# Patient Record
Sex: Male | Born: 1940 | Race: Black or African American | Hispanic: No | Marital: Married | Smoking: Former smoker
Health system: Southern US, Community
[De-identification: ages and names within clinical notes are randomized; demographics above are authoritative.]

## PROBLEM LIST (undated history)

## (undated) DIAGNOSIS — G4733 Obstructive sleep apnea (adult) (pediatric): Secondary | ICD-10-CM

## (undated) DIAGNOSIS — H9190 Unspecified hearing loss, unspecified ear: Secondary | ICD-10-CM

## (undated) DIAGNOSIS — R0600 Dyspnea, unspecified: Secondary | ICD-10-CM

## (undated) DIAGNOSIS — Z794 Long term (current) use of insulin: Secondary | ICD-10-CM

## (undated) DIAGNOSIS — N433 Hydrocele, unspecified: Secondary | ICD-10-CM

## (undated) DIAGNOSIS — Z972 Presence of dental prosthetic device (complete) (partial): Secondary | ICD-10-CM

## (undated) DIAGNOSIS — Z9989 Dependence on other enabling machines and devices: Secondary | ICD-10-CM

## (undated) DIAGNOSIS — N183 Chronic kidney disease, stage 3 unspecified: Secondary | ICD-10-CM

## (undated) DIAGNOSIS — E782 Mixed hyperlipidemia: Secondary | ICD-10-CM

## (undated) DIAGNOSIS — E119 Type 2 diabetes mellitus without complications: Secondary | ICD-10-CM

## (undated) DIAGNOSIS — R0609 Other forms of dyspnea: Secondary | ICD-10-CM

## (undated) DIAGNOSIS — I10 Essential (primary) hypertension: Secondary | ICD-10-CM

## (undated) DIAGNOSIS — K08109 Complete loss of teeth, unspecified cause, unspecified class: Secondary | ICD-10-CM

## (undated) DIAGNOSIS — R600 Localized edema: Secondary | ICD-10-CM

## (undated) DIAGNOSIS — R351 Nocturia: Secondary | ICD-10-CM

## (undated) DIAGNOSIS — I714 Abdominal aortic aneurysm, without rupture, unspecified: Secondary | ICD-10-CM

## (undated) DIAGNOSIS — M1990 Unspecified osteoarthritis, unspecified site: Secondary | ICD-10-CM

## (undated) DIAGNOSIS — I5042 Chronic combined systolic (congestive) and diastolic (congestive) heart failure: Secondary | ICD-10-CM

## (undated) DIAGNOSIS — Z6841 Body Mass Index (BMI) 40.0 and over, adult: Secondary | ICD-10-CM

## (undated) DIAGNOSIS — L439 Lichen planus, unspecified: Secondary | ICD-10-CM

## (undated) DIAGNOSIS — E1140 Type 2 diabetes mellitus with diabetic neuropathy, unspecified: Secondary | ICD-10-CM

## (undated) HISTORY — PX: CARDIOVASCULAR STRESS TEST: SHX262

## (undated) HISTORY — DX: Lichen planus, unspecified: L43.9

## (undated) HISTORY — PX: MULTIPLE TOOTH EXTRACTIONS: SHX2053

## (undated) HISTORY — PX: TOTAL KNEE ARTHROPLASTY: SHX125

## (undated) HISTORY — DX: Body Mass Index (BMI) 40.0 and over, adult: Z684

## (undated) HISTORY — DX: Morbid (severe) obesity due to excess calories: E66.01

## (undated) HISTORY — PX: PROSTATECTOMY: SHX69

## (undated) HISTORY — PX: COLONOSCOPY W/ BIOPSIES AND POLYPECTOMY: SHX1376

## (undated) HISTORY — PX: CARDIAC CATHETERIZATION: SHX172

## (undated) HISTORY — PX: TRANSTHORACIC ECHOCARDIOGRAM: SHX275

---

## 2000-01-20 ENCOUNTER — Encounter: Admission: RE | Admit: 2000-01-20 | Discharge: 2000-04-19 | Payer: Self-pay | Admitting: Internal Medicine

## 2001-07-24 ENCOUNTER — Emergency Department (HOSPITAL_COMMUNITY): Admission: EM | Admit: 2001-07-24 | Discharge: 2001-07-24 | Payer: Self-pay | Admitting: Emergency Medicine

## 2004-01-09 ENCOUNTER — Ambulatory Visit (HOSPITAL_BASED_OUTPATIENT_CLINIC_OR_DEPARTMENT_OTHER): Admission: RE | Admit: 2004-01-09 | Discharge: 2004-01-09 | Payer: Self-pay | Admitting: Internal Medicine

## 2007-12-02 HISTORY — PX: KNEE ARTHROSCOPY: SHX127

## 2008-10-14 DIAGNOSIS — Z9989 Dependence on other enabling machines and devices: Secondary | ICD-10-CM

## 2008-10-14 DIAGNOSIS — G4733 Obstructive sleep apnea (adult) (pediatric): Secondary | ICD-10-CM

## 2008-10-14 HISTORY — DX: Obstructive sleep apnea (adult) (pediatric): G47.33

## 2008-10-14 HISTORY — DX: Obstructive sleep apnea (adult) (pediatric): Z99.89

## 2009-04-27 ENCOUNTER — Encounter: Admission: RE | Admit: 2009-04-27 | Discharge: 2009-04-27 | Payer: Self-pay | Admitting: Orthopedic Surgery

## 2009-04-28 ENCOUNTER — Encounter: Admission: RE | Admit: 2009-04-28 | Discharge: 2009-04-28 | Payer: Self-pay | Admitting: Orthopedic Surgery

## 2009-08-27 ENCOUNTER — Inpatient Hospital Stay (HOSPITAL_COMMUNITY): Admission: RE | Admit: 2009-08-27 | Discharge: 2009-09-03 | Payer: Self-pay | Admitting: Orthopedic Surgery

## 2009-09-03 ENCOUNTER — Encounter (INDEPENDENT_AMBULATORY_CARE_PROVIDER_SITE_OTHER): Payer: Self-pay | Admitting: Orthopedic Surgery

## 2009-09-03 ENCOUNTER — Ambulatory Visit: Payer: Self-pay | Admitting: Vascular Surgery

## 2010-12-01 IMAGING — CT CT ABDOMEN W/O CM
3 of 4 series · 13 of 42 positions shown, 19 images · non-contrast
Comparison: None.

CT CHEST

CLINICAL DATA: Cough with abnormal chest x-ray.  Elevated right
hemidiaphragm.

CT CHEST AND ABDOMEN WITHOUT CONTRAST
TECHNIQUE: Multidetector CT imaging of the chest and abdomen was p
erformed following the standard
protocol without intravenous contrast.

[Series 2: chest/abd/pelvis · axial · 0.98mm/px · z∈[-430,-60]mm · 8 of 96 slices shown, 13 images]
[im 11/96  soft-tissue]
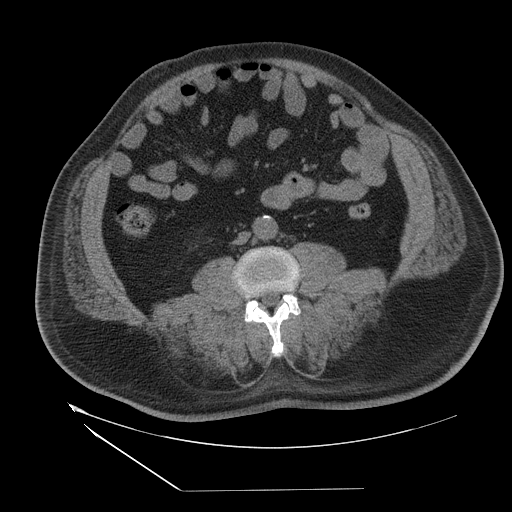
[im 11/96  bone]
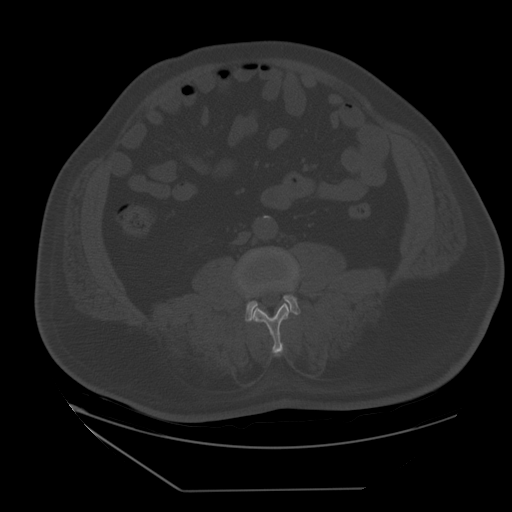
[im 22/96  soft-tissue]
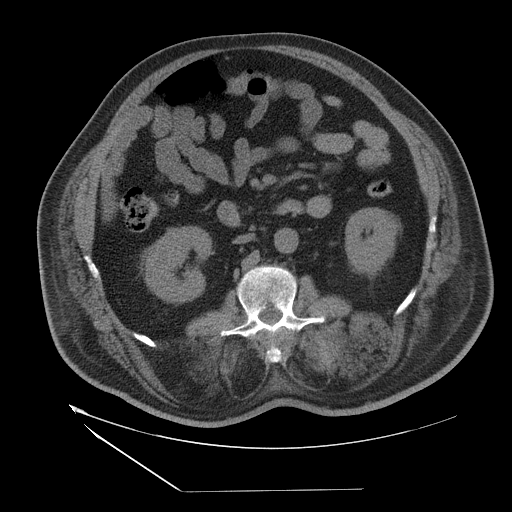
[im 32/96  soft-tissue]
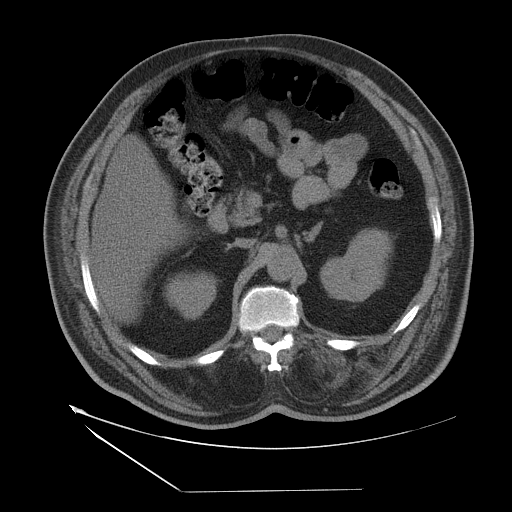
[im 43/96  soft-tissue]
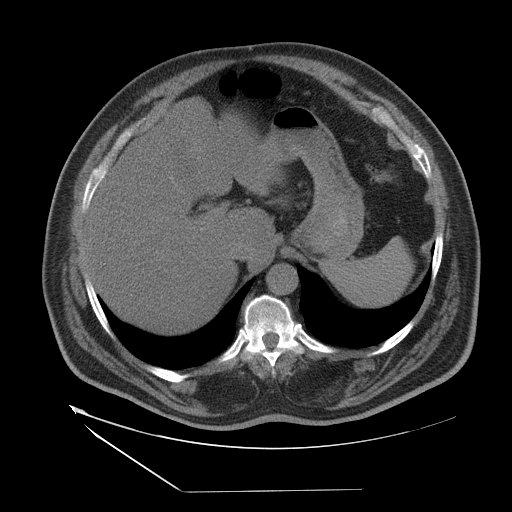
[im 53/96  soft-tissue]
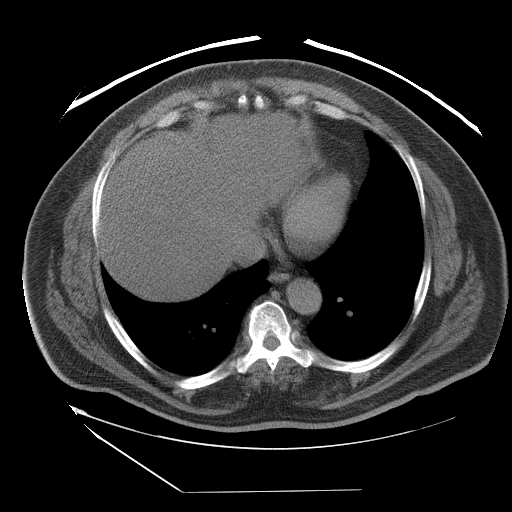
[im 53/96  lung]
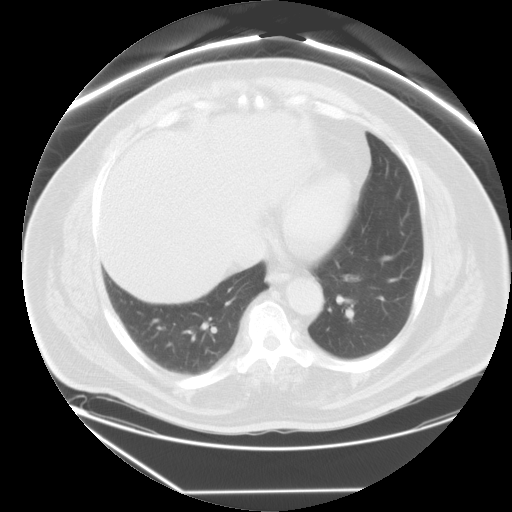
[im 64/96  soft-tissue]
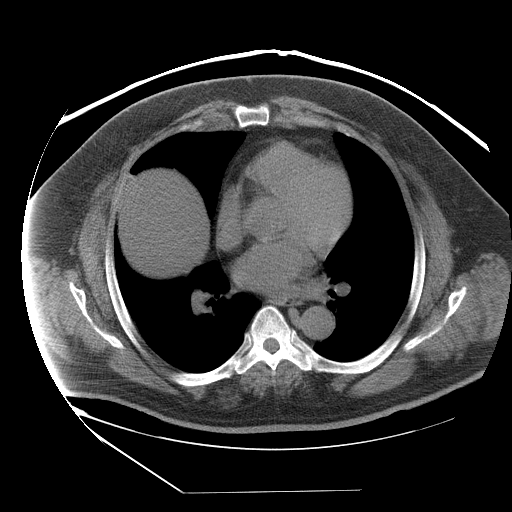
[im 64/96  lung]
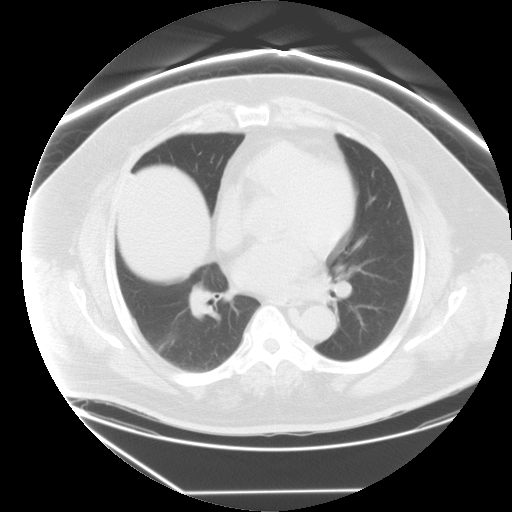
[im 74/96  soft-tissue]
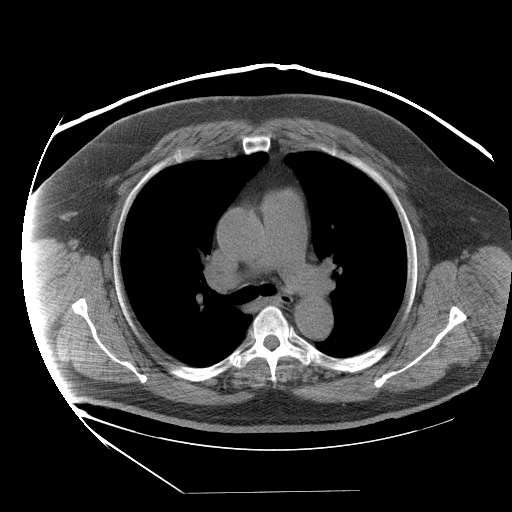
[im 74/96  lung]
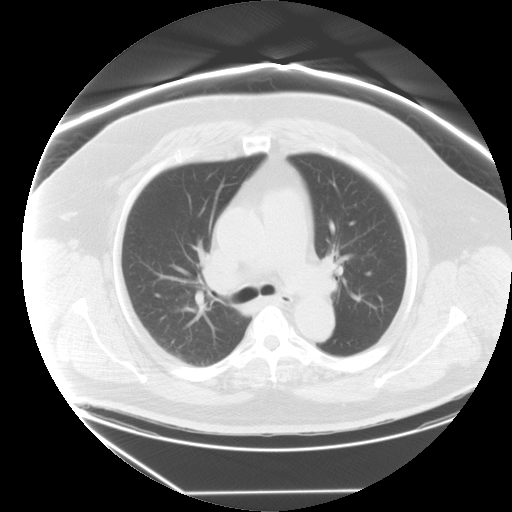
[im 85/96  soft-tissue]
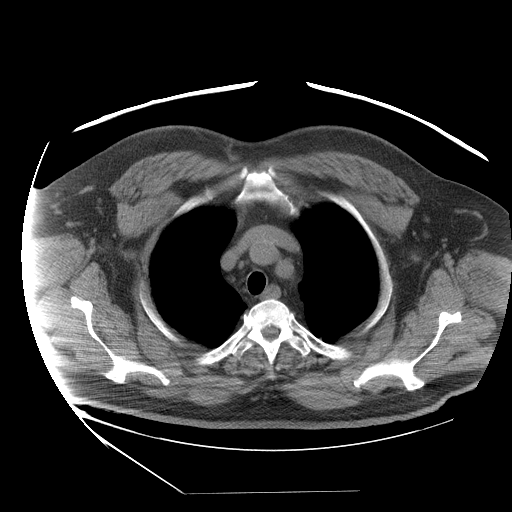
[im 85/96  lung]
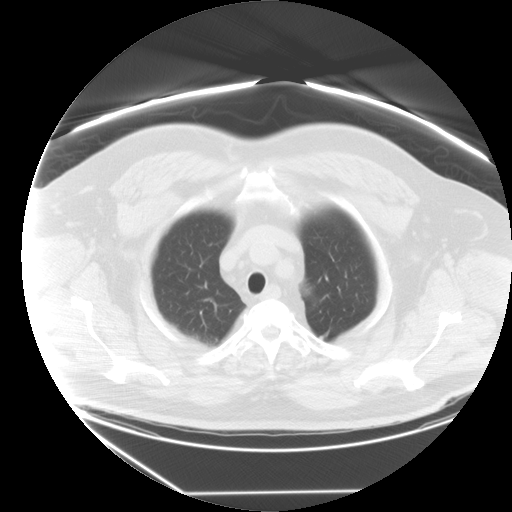

[Series 402: sag chest/abd · sagittal · 0.98mm/px · 2 of 146 slices shown]
[im 11/146  soft-tissue]
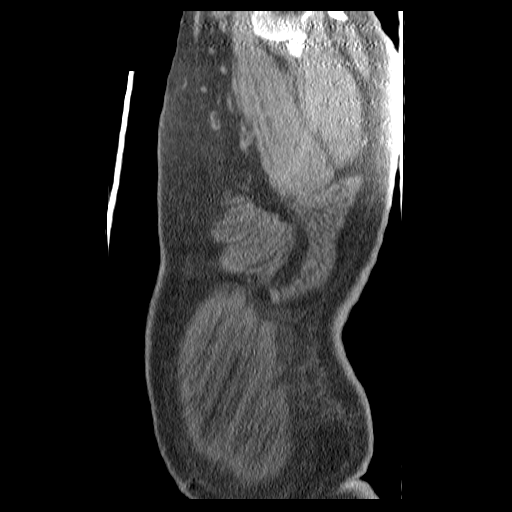
[im 32/146  soft-tissue]
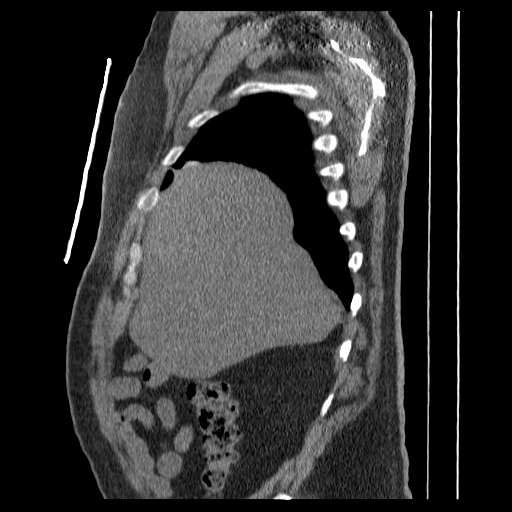

[Series 403: cor chest/abd · coronal · 0.98mm/px · 3 of 117 slices shown, 4 images]
[im 39/117  soft-tissue]
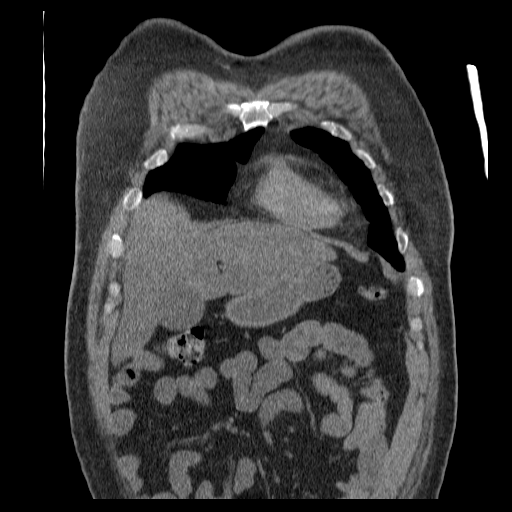
[im 52/117  soft-tissue]
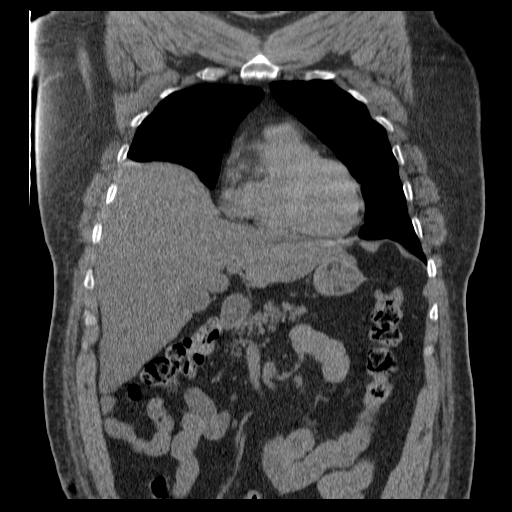
[im 52/117  bone]
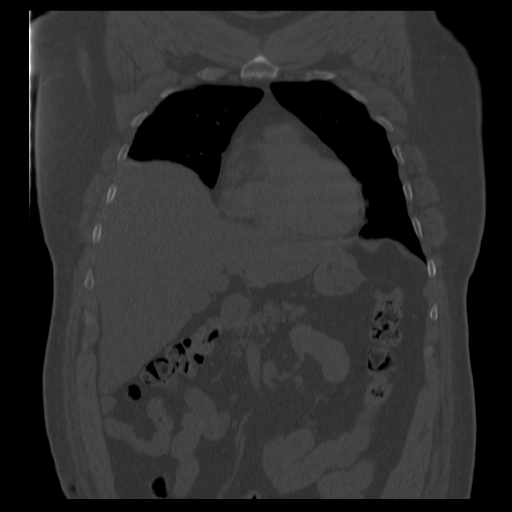
[im 65/117  soft-tissue]
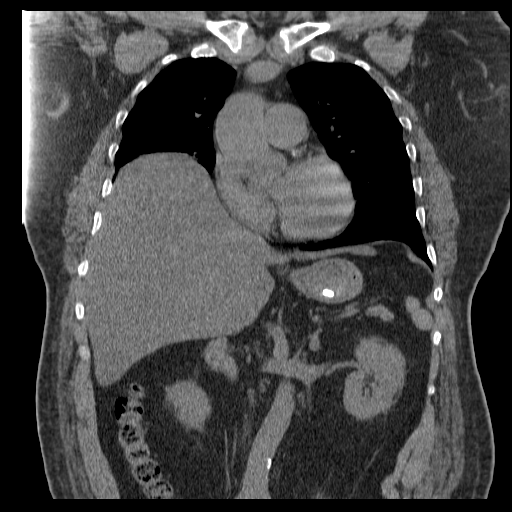

[13 of 42 positions shown; findings below may reference images not displayed]

FINDINGS: There is no axillary, mediastinal, or hilar
lymphadenopathy.  11 mm short-axis AP window lymph node is
borderline enlarged.  Heart size is normal.  There is no
pericardial or pleural effusion.

The elevation of the right hemidiaphragm is noted with some
minimal/trace atelectasis at the central right lung base.  Other
tiny focus of subsegmental atelectasis or linear scars seen
posterior to the proximal descending thoracic aorta.  There is no
focal airspace consolidation.  No parenchymal nodule or mass.
IMPRESSION: No acute findings in the chest.  Asymmetric elevation of the right
hemidiaphragm with tiny areas of subsegmental atelectasis
bilaterally.

CT ABDOMEN
FINDINGS: The liver shows diffuse fatty infiltration.  No focal
abnormality is evident within the liver or spleen on this study
without intravenous contrast material.  The stomach, duodenum,
pancreas, gallbladder, adrenal glands, and abdominal bowel loops
are normal.  Tiny cysts are seen in each kidney.

No abdominal aortic aneurysm.  No intraperitoneal free fluid.  No
abdominal lymphadenopathy.
IMPRESSION: Diffuse fatty infiltration of the liver.

## 2010-12-23 ENCOUNTER — Encounter: Payer: Self-pay | Admitting: Orthopedic Surgery

## 2011-03-06 LAB — GLUCOSE, CAPILLARY
Glucose-Capillary: 160 mg/dL — ABNORMAL HIGH (ref 70–99)
Glucose-Capillary: 166 mg/dL — ABNORMAL HIGH (ref 70–99)
Glucose-Capillary: 215 mg/dL — ABNORMAL HIGH (ref 70–99)
Glucose-Capillary: 220 mg/dL — ABNORMAL HIGH (ref 70–99)

## 2011-03-06 LAB — CROSSMATCH: Antibody Screen: NEGATIVE

## 2011-03-06 LAB — CBC
HCT: 25 % — ABNORMAL LOW (ref 39.0–52.0)
Hemoglobin: 8.4 g/dL — ABNORMAL LOW (ref 13.0–17.0)
Hemoglobin: 8.4 g/dL — ABNORMAL LOW (ref 13.0–17.0)
MCHC: 33.2 g/dL (ref 30.0–36.0)
MCHC: 33.5 g/dL (ref 30.0–36.0)
MCV: 81.6 fL (ref 78.0–100.0)
Platelets: 152 10*3/uL (ref 150–400)
RBC: 3 MIL/uL — ABNORMAL LOW (ref 4.22–5.81)
RDW: 15.1 % (ref 11.5–15.5)
WBC: 7.3 10*3/uL (ref 4.0–10.5)
WBC: 8.8 10*3/uL (ref 4.0–10.5)

## 2011-03-06 LAB — BASIC METABOLIC PANEL
BUN: 16 mg/dL (ref 6–23)
CO2: 27 mEq/L (ref 19–32)
CO2: 28 mEq/L (ref 19–32)
Calcium: 8.2 mg/dL — ABNORMAL LOW (ref 8.4–10.5)
Calcium: 8.5 mg/dL (ref 8.4–10.5)
Calcium: 8.7 mg/dL (ref 8.4–10.5)
Chloride: 101 mEq/L (ref 96–112)
Creatinine, Ser: 1.23 mg/dL (ref 0.4–1.5)
Creatinine, Ser: 1.79 mg/dL — ABNORMAL HIGH (ref 0.4–1.5)
GFR calc Af Amer: 46 mL/min — ABNORMAL LOW (ref >60)
GFR calc Af Amer: >60 mL/min (ref >60)
Glucose, Bld: 152 mg/dL — ABNORMAL HIGH (ref 70–99)
Sodium: 139 mEq/L (ref 135–145)

## 2011-03-06 LAB — PROTIME-INR
INR: 1.2 (ref 0.00–1.49)
INR: 1.2 (ref 0.00–1.49)
INR: 1.3 (ref 0.00–1.49)
INR: 1.4 (ref 0.00–1.49)
Prothrombin Time: 14.7 seconds (ref 11.6–15.2)
Prothrombin Time: 15.1 seconds (ref 11.6–15.2)
Prothrombin Time: 15.8 seconds — ABNORMAL HIGH (ref 11.6–15.2)

## 2011-03-06 LAB — COMPREHENSIVE METABOLIC PANEL
Alkaline Phosphatase: 33 U/L — ABNORMAL LOW (ref 39–117)
BUN: 11 mg/dL (ref 6–23)
Chloride: 99 mEq/L (ref 96–112)
GFR calc non Af Amer: 55 mL/min — ABNORMAL LOW (ref >60)
Glucose, Bld: 208 mg/dL — ABNORMAL HIGH (ref 70–99)
Potassium: 3.5 mEq/L (ref 3.5–5.1)
Total Bilirubin: 1 mg/dL (ref 0.3–1.2)

## 2011-03-07 LAB — URINE CULTURE: Culture: NO GROWTH

## 2011-03-07 LAB — CBC
HCT: 24.8 % — ABNORMAL LOW (ref 39.0–52.0)
HCT: 27.5 % — ABNORMAL LOW (ref 39.0–52.0)
HCT: 29 % — ABNORMAL LOW (ref 39.0–52.0)
HCT: 39.6 % (ref 39.0–52.0)
Hemoglobin: 13.2 g/dL (ref 13.0–17.0)
Hemoglobin: 8.4 g/dL — ABNORMAL LOW (ref 13.0–17.0)
Hemoglobin: 9.2 g/dL — ABNORMAL LOW (ref 13.0–17.0)
MCHC: 33.3 g/dL (ref 30.0–36.0)
MCV: 81.7 fL (ref 78.0–100.0)
MCV: 82 fL (ref 78.0–100.0)
MCV: 82 fL (ref 78.0–100.0)
Platelets: 118 10*3/uL — ABNORMAL LOW (ref 150–400)
Platelets: 133 10*3/uL — ABNORMAL LOW (ref 150–400)
RBC: 3.04 MIL/uL — ABNORMAL LOW (ref 4.22–5.81)
RBC: 3.36 MIL/uL — ABNORMAL LOW (ref 4.22–5.81)
RBC: 3.54 MIL/uL — ABNORMAL LOW (ref 4.22–5.81)
RBC: 4.09 MIL/uL — ABNORMAL LOW (ref 4.22–5.81)
RDW: 14.8 % (ref 11.5–15.5)
WBC: 10 10*3/uL (ref 4.0–10.5)
WBC: 10.2 10*3/uL (ref 4.0–10.5)
WBC: 10.9 10*3/uL — ABNORMAL HIGH (ref 4.0–10.5)
WBC: 12 10*3/uL — ABNORMAL HIGH (ref 4.0–10.5)
WBC: 7.7 10*3/uL (ref 4.0–10.5)

## 2011-03-07 LAB — BASIC METABOLIC PANEL
BUN: 10 mg/dL (ref 6–23)
CO2: 25 mEq/L (ref 19–32)
CO2: 27 mEq/L (ref 19–32)
Calcium: 8.9 mg/dL (ref 8.4–10.5)
Chloride: 101 mEq/L (ref 96–112)
Chloride: 101 mEq/L (ref 96–112)
Chloride: 103 mEq/L (ref 96–112)
Chloride: 99 mEq/L (ref 96–112)
Creatinine, Ser: 1.38 mg/dL (ref 0.4–1.5)
Creatinine, Ser: 1.46 mg/dL (ref 0.4–1.5)
Creatinine, Ser: 2.28 mg/dL — ABNORMAL HIGH (ref 0.4–1.5)
GFR calc Af Amer: 35 mL/min — ABNORMAL LOW (ref >60)
GFR calc Af Amer: 38 mL/min — ABNORMAL LOW (ref >60)
GFR calc Af Amer: 58 mL/min — ABNORMAL LOW (ref >60)
GFR calc Af Amer: >60 mL/min (ref >60)
GFR calc non Af Amer: 48 mL/min — ABNORMAL LOW (ref >60)
GFR calc non Af Amer: 51 mL/min — ABNORMAL LOW (ref >60)
Potassium: 3.6 mEq/L (ref 3.5–5.1)
Potassium: 3.6 mEq/L (ref 3.5–5.1)
Potassium: 3.8 mEq/L (ref 3.5–5.1)

## 2011-03-07 LAB — GLUCOSE, CAPILLARY
Glucose-Capillary: 154 mg/dL — ABNORMAL HIGH (ref 70–99)
Glucose-Capillary: 180 mg/dL — ABNORMAL HIGH (ref 70–99)
Glucose-Capillary: 183 mg/dL — ABNORMAL HIGH (ref 70–99)
Glucose-Capillary: 206 mg/dL — ABNORMAL HIGH (ref 70–99)
Glucose-Capillary: 228 mg/dL — ABNORMAL HIGH (ref 70–99)
Glucose-Capillary: 247 mg/dL — ABNORMAL HIGH (ref 70–99)
Glucose-Capillary: 293 mg/dL — ABNORMAL HIGH (ref 70–99)

## 2011-03-07 LAB — DIFFERENTIAL
Basophils Absolute: 0.1 10*3/uL (ref 0.0–0.1)
Basophils Relative: 1 % (ref 0–1)
Eosinophils Relative: 1 % (ref 0–5)
Lymphocytes Relative: 16 % (ref 12–46)
Lymphs Abs: 1.6 10*3/uL (ref 0.7–4.0)
Monocytes Absolute: 0.9 10*3/uL (ref 0.1–1.0)
Monocytes Absolute: 1.8 10*3/uL — ABNORMAL HIGH (ref 0.1–1.0)
Monocytes Relative: 18 % — ABNORMAL HIGH (ref 3–12)
Neutro Abs: 3.2 10*3/uL (ref 1.7–7.7)
Neutro Abs: 6.4 10*3/uL (ref 1.7–7.7)
Neutrophils Relative %: 42 % — ABNORMAL LOW (ref 43–77)

## 2011-03-07 LAB — COMPREHENSIVE METABOLIC PANEL
Alkaline Phosphatase: 50 U/L (ref 39–117)
BUN: 18 mg/dL (ref 6–23)
Chloride: 100 mEq/L (ref 96–112)
Glucose, Bld: 158 mg/dL — ABNORMAL HIGH (ref 70–99)
Potassium: 4.2 mEq/L (ref 3.5–5.1)
Total Bilirubin: 0.6 mg/dL (ref 0.3–1.2)

## 2011-03-07 LAB — URINALYSIS, ROUTINE W REFLEX MICROSCOPIC
Glucose, UA: NEGATIVE mg/dL
Ketones, ur: NEGATIVE mg/dL
pH: 5.5 (ref 5.0–8.0)

## 2011-03-07 LAB — HEMOGLOBIN A1C
Hgb A1c MFr Bld: 7.6 % — ABNORMAL HIGH (ref 4.6–6.1)
Mean Plasma Glucose: 171 mg/dL

## 2011-03-07 LAB — URINALYSIS, MICROSCOPIC ONLY
Bilirubin Urine: NEGATIVE
Glucose, UA: 500 mg/dL — AB
Glucose, UA: NEGATIVE mg/dL
Ketones, ur: NEGATIVE mg/dL
Protein, ur: 100 mg/dL — AB
Specific Gravity, Urine: 1.018 (ref 1.005–1.030)
Urobilinogen, UA: 0.2 mg/dL (ref 0.0–1.0)
pH: 5.5 (ref 5.0–8.0)
pH: 6 (ref 5.0–8.0)

## 2011-03-07 LAB — CULTURE, BLOOD (ROUTINE X 2)
Culture: NO GROWTH
Culture: NO GROWTH
Culture: NO GROWTH
Culture: NO GROWTH

## 2011-03-07 LAB — APTT: aPTT: 28 seconds (ref 24–37)

## 2011-03-07 LAB — TYPE AND SCREEN
ABO/RH(D): A POS
Antibody Screen: NEGATIVE

## 2011-03-07 LAB — PROTIME-INR
INR: 0.9 (ref 0.00–1.49)
INR: 1.1 (ref 0.00–1.49)
Prothrombin Time: 16.1 seconds — ABNORMAL HIGH (ref 11.6–15.2)

## 2011-05-29 ENCOUNTER — Encounter: Payer: Self-pay | Admitting: Podiatry

## 2011-05-29 DIAGNOSIS — E119 Type 2 diabetes mellitus without complications: Secondary | ICD-10-CM | POA: Insufficient documentation

## 2012-12-02 ENCOUNTER — Other Ambulatory Visit (HOSPITAL_COMMUNITY): Payer: Self-pay | Admitting: Cardiovascular Disease

## 2012-12-02 DIAGNOSIS — I119 Hypertensive heart disease without heart failure: Secondary | ICD-10-CM

## 2012-12-07 ENCOUNTER — Ambulatory Visit (HOSPITAL_COMMUNITY)
Admission: RE | Admit: 2012-12-07 | Discharge: 2012-12-07 | Disposition: A | Discharge status: Home / Self Care | Payer: 59 | Source: Ambulatory Visit | Attending: Cardiovascular Disease | Admitting: Cardiovascular Disease

## 2012-12-07 DIAGNOSIS — E119 Type 2 diabetes mellitus without complications: Secondary | ICD-10-CM | POA: Insufficient documentation

## 2012-12-07 DIAGNOSIS — I379 Nonrheumatic pulmonary valve disorder, unspecified: Secondary | ICD-10-CM | POA: Diagnosis not present

## 2012-12-07 DIAGNOSIS — I059 Rheumatic mitral valve disease, unspecified: Secondary | ICD-10-CM | POA: Diagnosis not present

## 2012-12-07 DIAGNOSIS — I119 Hypertensive heart disease without heart failure: Secondary | ICD-10-CM | POA: Diagnosis present

## 2012-12-07 NOTE — Progress Notes (Signed)
2D Echo Performed 12/07/2012    Rajesh Wyss, RCS  

## 2012-12-13 ENCOUNTER — Encounter: Payer: Self-pay | Admitting: Physician Assistant

## 2012-12-13 ENCOUNTER — Other Ambulatory Visit: Payer: Self-pay | Admitting: Physician Assistant

## 2012-12-13 DIAGNOSIS — E1165 Type 2 diabetes mellitus with hyperglycemia: Secondary | ICD-10-CM

## 2012-12-13 DIAGNOSIS — E7800 Pure hypercholesterolemia, unspecified: Secondary | ICD-10-CM | POA: Insufficient documentation

## 2012-12-13 DIAGNOSIS — E118 Type 2 diabetes mellitus with unspecified complications: Secondary | ICD-10-CM

## 2012-12-13 DIAGNOSIS — G4733 Obstructive sleep apnea (adult) (pediatric): Secondary | ICD-10-CM | POA: Insufficient documentation

## 2012-12-13 DIAGNOSIS — Z6841 Body Mass Index (BMI) 40.0 and over, adult: Secondary | ICD-10-CM

## 2012-12-13 DIAGNOSIS — N400 Enlarged prostate without lower urinary tract symptoms: Secondary | ICD-10-CM | POA: Insufficient documentation

## 2012-12-13 DIAGNOSIS — E119 Type 2 diabetes mellitus without complications: Secondary | ICD-10-CM | POA: Insufficient documentation

## 2012-12-13 DIAGNOSIS — L439 Lichen planus, unspecified: Secondary | ICD-10-CM

## 2012-12-13 DIAGNOSIS — M1712 Unilateral primary osteoarthritis, left knee: Secondary | ICD-10-CM | POA: Insufficient documentation

## 2012-12-13 DIAGNOSIS — I10 Essential (primary) hypertension: Secondary | ICD-10-CM | POA: Insufficient documentation

## 2012-12-13 NOTE — H&P (Signed)
TOTAL KNEE ADMISSION H&P  Patient is being admitted for left total knee arthroplasty.  Subjective:  Chief Complaint:left knee pain.  HPI: Bradley Alvarez, 72 y.o. male, has a history of pain and functional disability in the left knee due to arthritis and has failed non-surgical conservative treatments for greater than 12 weeks to includeNSAID's and/or analgesics, corticosteriod injections, viscosupplementation injections, flexibility and strengthening excercises, supervised PT with diminished ADL's post treatment, use of assistive devices, weight reduction as appropriate and activity modification.  Onset of symptoms was gradual, starting >10 years ago with gradually worsening course since that time. The patient noted no past surgery on the left knee(s).  Patient currently rates pain in the left knee(s) at 10 out of 10 with activity. Patient has night pain, worsening of pain with activity and weight bearing, pain that interferes with activities of daily living, crepitus and joint swelling.  Patient has evidence of subchondral sclerosis, periarticular osteophytes and joint space narrowing by imaging studies.  There is no active infection.  Patient Active Problem List   Diagnosis Date Noted  . Hypertension   . Diabetes mellitus type 2, uncontrolled, with complications   . Hypercholesteremia   . Lichen planus   . Left knee DJD   . Morbid obesity with BMI of 45.0-49.9, adult   . Sleep apnea, obstructive   . Benign prostate hyperplasia   . Diabetes mellitus 05/29/2011   Past Medical History  Diagnosis Date  . Hypertension   . Diabetes mellitus type 2, uncontrolled, with complications   . Hypercholesteremia   . Benign prostate hyperplasia   . Sleep apnea, obstructive   . Morbid obesity with BMI of 45.0-49.9, adult   . Lichen planus   . Left knee DJD     Past Surgical History  Procedure Date  . 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 Suprapubic prostatectomy      (Not in a  hospital admission) No Known Allergies   Current Outpatient Prescriptions on File Prior to Visit  Medication Sig Dispense Refill  . carvedilol (COREG) 25 MG tablet Take 25 mg by mouth 2 (two) times daily with a meal.      . exenatide (BYETTA) 10 MCG/0.04ML SOLN Inject 10 mcg into the skin 2 (two) times daily with a meal.      . felodipine (PLENDIL) 5 MG 24 hr tablet Take 10 mg by mouth daily.      . furosemide (LASIX) 40 MG tablet Take 40 mg by mouth daily.      Marland Kitchen glimepiride (AMARYL) 4 MG tablet Take 4 mg by mouth daily before breakfast.      . lisinopril (PRINIVIL,ZESTRIL) 40 MG tablet Take 40 mg by mouth daily.      . metFORMIN (GLUCOPHAGE) 1000 MG tablet Take 1,000 mg by mouth 2 (two) times daily with a meal.      . potassium chloride SA (K-DUR,KLOR-CON) 20 MEQ tablet Take 20 mEq by mouth 2 (two) times daily.      . simvastatin (ZOCOR) 40 MG tablet Take 40 mg by mouth every evening.      . triamterene-hydrochlorothiazide (MAXZIDE-25) 37.5-25 MG per tablet Take 1 tablet by mouth daily.         History  Substance Use Topics  . Smoking status: Former Smoker    Quit date: 12/14/1971  . Smokeless tobacco: Not on file  . Alcohol Use: Yes     Comment: 1/5 of liqour will last him about 2 weeks  Family History  Problem Relation Age of Onset  . Hypertension    . Diabetes Mellitus II    . Stroke    . Heart disease       Review of Systems  Constitutional: Negative.   HENT: Positive for hearing loss.   Eyes: Negative.   Respiratory: Negative.   Cardiovascular:       Irregular heart bead  Gastrointestinal: Negative.   Genitourinary: Negative.   Musculoskeletal: Positive for back pain and joint pain.  Skin:       lichin planus in his privates  Neurological: Negative.     Objective:  Physical Exam  Constitutional: He is oriented to person, place, and time. He appears well-developed and well-nourished.  HENT:  Head: Normocephalic and atraumatic.  Mouth/Throat: Oropharynx  is clear and moist.  Eyes: EOM are normal. Pupils are equal, round, and reactive to light.  Neck: Neck supple.  Cardiovascular: Normal rate and regular rhythm.   Murmur heard. Respiratory: Effort normal.  GI: Soft. Bowel sounds are normal.  Genitourinary:       Lichen planus on his scrotum  Musculoskeletal:       He's ambulating with assistance of a crutch. He has active range of motion 0-90 degrees medial and lateral joint line tenderness and normal patella tracking. Right knee has well healed total knee incision minimal swelling and minimal pain range of motion 0-105 degrees. He has 2+ dorsalis pedis pulses.  Neurological: He is alert and oriented to person, place, and time.  Skin: Skin is warm and dry. Rash noted.  Psychiatric: He has a normal mood and affect. His behavior is normal.    Vital signs in last 24 hours: Last recorded: 01/13 1300   BP: 189/86 Pulse: 75  Temp: 97.2 F (36.2 C)    Height: 6\' 2"  (1.88 m) SpO2: 93  Weight: 160.12 kg (353 lb)     Labs:   Estimated Body mass index is 45.32 kg/(m^2) as calculated from the following:   Height as of this encounter: 6\' 2" (1.88 m).   Weight as of this encounter: 353 lb(160.12 kg).   Imaging Review Plain radiographs demonstrate severe degenerative joint disease of the bilaterally knee(s). The overall alignment issignificant varus. The bone quality appears to be good for age and reported activity level.  Assessment/Plan:  End stage arthritis, left knee   The patient history, physical examination, clinical judgment of the provider and imaging studies are consistent with end stage degenerative joint disease of the left knee(s) and total knee arthroplasty is deemed medically necessary. The treatment options including medical management, injection therapy arthroscopy and arthroplasty were discussed at length. The risks and benefits of total knee arthroplasty were presented and reviewed. The risks due to aseptic loosening,  infection, stiffness, patella tracking problems, thromboembolic complications and other imponderables were discussed. The patient acknowledged the explanation, agreed to proceed with the plan and consent was signed. Patient is being admitted for inpatient treatment for surgery, pain control, PT, OT, prophylactic antibiotics, VTE prophylaxis, progressive ambulation and ADL's and discharge planning. The patient is planning to be discharged to skilled nursing facility

## 2012-12-16 ENCOUNTER — Encounter (HOSPITAL_COMMUNITY)
Admission: RE | Admit: 2012-12-16 | Discharge: 2012-12-16 | Disposition: A | Discharge status: Home / Self Care | Payer: 59 | Source: Ambulatory Visit | Attending: Orthopedic Surgery | Admitting: Orthopedic Surgery

## 2012-12-16 ENCOUNTER — Encounter (HOSPITAL_COMMUNITY): Payer: Self-pay | Admitting: Pharmacy Technician

## 2012-12-16 ENCOUNTER — Encounter (HOSPITAL_COMMUNITY): Payer: Self-pay

## 2012-12-16 ENCOUNTER — Encounter (HOSPITAL_COMMUNITY)
Admission: RE | Admit: 2012-12-16 | Discharge: 2012-12-16 | Disposition: A | Discharge status: Home / Self Care | Payer: 59 | Source: Ambulatory Visit | Attending: Physician Assistant | Admitting: Physician Assistant

## 2012-12-16 LAB — CBC WITH DIFFERENTIAL/PLATELET
Basophils Absolute: 0 10*3/uL (ref 0.0–0.1)
Lymphocytes Relative: 41 % (ref 12–46)
Lymphs Abs: 3.1 10*3/uL (ref 0.7–4.0)
MCV: 79 fL (ref 78.0–100.0)
Neutro Abs: 3.4 10*3/uL (ref 1.7–7.7)
Neutrophils Relative %: 45 % (ref 43–77)
Platelets: 162 10*3/uL (ref 150–400)
RBC: 4.96 MIL/uL (ref 4.22–5.81)
WBC: 7.5 10*3/uL (ref 4.0–10.5)

## 2012-12-16 LAB — SURGICAL PCR SCREEN: Staphylococcus aureus: NEGATIVE

## 2012-12-16 LAB — URINALYSIS, ROUTINE W REFLEX MICROSCOPIC
Glucose, UA: NEGATIVE mg/dL
Hgb urine dipstick: NEGATIVE
Leukocytes, UA: NEGATIVE
Specific Gravity, Urine: 1.011 (ref 1.005–1.030)
Urobilinogen, UA: 0.2 mg/dL (ref 0.0–1.0)

## 2012-12-16 LAB — TYPE AND SCREEN: Antibody Screen: NEGATIVE

## 2012-12-16 LAB — COMPREHENSIVE METABOLIC PANEL
ALT: 27 U/L (ref 0–53)
AST: 29 U/L (ref 0–37)
Alkaline Phosphatase: 43 U/L (ref 39–117)
CO2: 28 mEq/L (ref 19–32)
Chloride: 96 mEq/L (ref 96–112)
GFR calc Af Amer: 75 mL/min — ABNORMAL LOW (ref >90)
GFR calc non Af Amer: 65 mL/min — ABNORMAL LOW (ref >90)
Glucose, Bld: 90 mg/dL (ref 70–99)
Potassium: 3.2 mEq/L — ABNORMAL LOW (ref 3.5–5.1)
Sodium: 139 mEq/L (ref 135–145)

## 2012-12-16 LAB — APTT: aPTT: 32 seconds (ref 24–37)

## 2012-12-16 NOTE — Pre-Procedure Instructions (Signed)
Bradley Alvarez  12/16/2012   Your procedure is scheduled on: Monday  12/20/12   Report to [facility] at 830 AM.  Call this number if you have problems the morning of surgery: [phone]   Remember:   Do not eat food or drink liquids after midnight.   Take these medicines the morning of surgery with A SIP OF WATER: COREG(CARVEDILOL), PLENDIL, HYDROCODONE IF NEEDED,  POTASSIUM    Do not wear jewelry, make-up or nail polish.  Do not wear lotions, powders, or perfumes. You may wear deodorant.  Do not shave 48 hours prior to surgery. Men may shave face and neck.  Do not bring valuables to the hospital.  Contacts, dentures or bridgework may not be worn into surgery.  Leave suitcase in the car. After surgery it may be brought to your room.  For patients admitted to the hospital, checkout time is 11:00 AM the day of  discharge.   Patients discharged the day of surgery will not be allowed to drive  home.  Name and phone number of your driver:   Special Instructions: Shower using CHG 2 nights before surgery and the night before surgery.  If you shower the day of surgery use CHG.  Use special wash - you have one bottle of CHG for all showers.  You should use approximately 1/3 of the bottle for each shower.   Please read over the following fact sheets that you were given: Pain Booklet, Coughing and Deep Breathing, Blood Transfusion Information, Total Joint Packet, MRSA Information and Surgical Site Infection Prevention

## 2012-12-16 NOTE — Progress Notes (Signed)
REQUESTED SLEEP STUDY FROM DR [facility] OFFICE.

## 2012-12-17 LAB — URINE CULTURE: Culture: NO GROWTH

## 2012-12-19 MED ORDER — DEXTROSE 5 % IV SOLN
3.0000 g | INTRAVENOUS | Status: AC
  Administered 2012-12-20: 3 g via INTRAVENOUS
  Filled 2012-12-19: qty 3000

## 2012-12-20 ENCOUNTER — Encounter (HOSPITAL_COMMUNITY): Payer: Self-pay | Admitting: *Deleted

## 2012-12-20 ENCOUNTER — Encounter (HOSPITAL_COMMUNITY): Payer: Self-pay | Admitting: Anesthesiology

## 2012-12-20 ENCOUNTER — Encounter (HOSPITAL_COMMUNITY)
Admission: RE | Disposition: A | Discharge status: Skilled Nursing Facility | Payer: Self-pay | Source: Ambulatory Visit | Attending: Orthopedic Surgery

## 2012-12-20 ENCOUNTER — Inpatient Hospital Stay (HOSPITAL_COMMUNITY)
Admission: RE | Admit: 2012-12-20 | Discharge: 2012-12-23 | DRG: 470 | Disposition: A | Discharge status: Skilled Nursing Facility | Payer: 59 | Source: Ambulatory Visit | Attending: Orthopedic Surgery | Admitting: Orthopedic Surgery

## 2012-12-20 ENCOUNTER — Inpatient Hospital Stay (HOSPITAL_COMMUNITY): Payer: 59 | Admitting: Anesthesiology

## 2012-12-20 DIAGNOSIS — M1710 Unilateral primary osteoarthritis, unspecified knee: Principal | ICD-10-CM | POA: Diagnosis present

## 2012-12-20 DIAGNOSIS — Z01810 Encounter for preprocedural cardiovascular examination: Secondary | ICD-10-CM

## 2012-12-20 DIAGNOSIS — I10 Essential (primary) hypertension: Secondary | ICD-10-CM | POA: Diagnosis present

## 2012-12-20 DIAGNOSIS — G4733 Obstructive sleep apnea (adult) (pediatric): Secondary | ICD-10-CM | POA: Diagnosis present

## 2012-12-20 DIAGNOSIS — E7800 Pure hypercholesterolemia, unspecified: Secondary | ICD-10-CM | POA: Diagnosis present

## 2012-12-20 DIAGNOSIS — IMO0002 Reserved for concepts with insufficient information to code with codable children: Principal | ICD-10-CM | POA: Diagnosis present

## 2012-12-20 DIAGNOSIS — Z6841 Body Mass Index (BMI) 40.0 and over, adult: Secondary | ICD-10-CM

## 2012-12-20 DIAGNOSIS — R238 Other skin changes: Secondary | ICD-10-CM | POA: Diagnosis not present

## 2012-12-20 DIAGNOSIS — E876 Hypokalemia: Secondary | ICD-10-CM | POA: Diagnosis not present

## 2012-12-20 DIAGNOSIS — D62 Acute posthemorrhagic anemia: Secondary | ICD-10-CM | POA: Diagnosis not present

## 2012-12-20 DIAGNOSIS — L439 Lichen planus, unspecified: Secondary | ICD-10-CM | POA: Diagnosis present

## 2012-12-20 DIAGNOSIS — L988 Other specified disorders of the skin and subcutaneous tissue: Secondary | ICD-10-CM | POA: Diagnosis present

## 2012-12-20 DIAGNOSIS — M1712 Unilateral primary osteoarthritis, left knee: Secondary | ICD-10-CM | POA: Diagnosis present

## 2012-12-20 DIAGNOSIS — E1165 Type 2 diabetes mellitus with hyperglycemia: Secondary | ICD-10-CM | POA: Diagnosis present

## 2012-12-20 DIAGNOSIS — Z01812 Encounter for preprocedural laboratory examination: Secondary | ICD-10-CM

## 2012-12-20 DIAGNOSIS — N400 Enlarged prostate without lower urinary tract symptoms: Secondary | ICD-10-CM | POA: Diagnosis present

## 2012-12-20 DIAGNOSIS — E119 Type 2 diabetes mellitus without complications: Secondary | ICD-10-CM | POA: Diagnosis present

## 2012-12-20 DIAGNOSIS — R339 Retention of urine, unspecified: Secondary | ICD-10-CM

## 2012-12-20 HISTORY — PX: TOTAL KNEE ARTHROPLASTY: SHX125

## 2012-12-20 LAB — CBC
HCT: 36.6 % — ABNORMAL LOW (ref 39.0–52.0)
MCHC: 32.8 g/dL (ref 30.0–36.0)
Platelets: 152 10*3/uL (ref 150–400)
RDW: 15.1 % (ref 11.5–15.5)

## 2012-12-20 LAB — CREATININE, SERUM: GFR calc non Af Amer: 63 mL/min — ABNORMAL LOW (ref >90)

## 2012-12-20 LAB — GLUCOSE, CAPILLARY
Glucose-Capillary: 155 mg/dL — ABNORMAL HIGH (ref 70–99)
Glucose-Capillary: 216 mg/dL — ABNORMAL HIGH (ref 70–99)
Glucose-Capillary: 229 mg/dL — ABNORMAL HIGH (ref 70–99)

## 2012-12-20 SURGERY — ARTHROPLASTY, KNEE, TOTAL
Anesthesia: General | Site: Knee | Laterality: Left | Wound class: Clean

## 2012-12-20 MED ORDER — LIDOCAINE HCL (CARDIAC) 20 MG/ML IV SOLN
INTRAVENOUS | Status: DC | PRN
  Administered 2012-12-20: 100 mg via INTRAVENOUS

## 2012-12-20 MED ORDER — MIDAZOLAM HCL 2 MG/2ML IJ SOLN
INTRAMUSCULAR | Status: AC
  Administered 2012-12-20: 2 mg
  Filled 2012-12-20: qty 2

## 2012-12-20 MED ORDER — PHENOL 1.4 % MT LIQD: 1.0000 | OROMUCOSAL | Status: DC | PRN

## 2012-12-20 MED ORDER — POTASSIUM CHLORIDE IN NACL 20-0.9 MEQ/L-% IV SOLN
INTRAVENOUS | Status: DC
  Administered 2012-12-20: 18:00:00 via INTRAVENOUS
  Filled 2012-12-20 (×9): qty 1000

## 2012-12-20 MED ORDER — CEFAZOLIN SODIUM-DEXTROSE 2-3 GM-% IV SOLR
2.0000 g | Freq: Four times a day (QID) | INTRAVENOUS | Status: AC
  Administered 2012-12-20 (×2): 2 g via INTRAVENOUS
  Filled 2012-12-20 (×2): qty 50

## 2012-12-20 MED ORDER — ZOLPIDEM TARTRATE 5 MG PO TABS: 5.0000 mg | ORAL_TABLET | Freq: Every evening | ORAL | Status: DC | PRN

## 2012-12-20 MED ORDER — DOCUSATE SODIUM 100 MG PO CAPS
100.0000 mg | ORAL_CAPSULE | Freq: Two times a day (BID) | ORAL | Status: DC
  Administered 2012-12-20 – 2012-12-23 (×6): 100 mg via ORAL
  Filled 2012-12-20 (×6): qty 1

## 2012-12-20 MED ORDER — FELODIPINE ER 10 MG PO TB24
10.0000 mg | ORAL_TABLET | Freq: Every day | ORAL | Status: DC
  Administered 2012-12-21 – 2012-12-23 (×3): 10 mg via ORAL
  Filled 2012-12-20 (×3): qty 1

## 2012-12-20 MED ORDER — GLYCOPYRROLATE 0.2 MG/ML IJ SOLN
INTRAMUSCULAR | Status: DC | PRN
  Administered 2012-12-20: .8 mg via INTRAVENOUS

## 2012-12-20 MED ORDER — BISACODYL 5 MG PO TBEC
10.0000 mg | DELAYED_RELEASE_TABLET | Freq: Every day | ORAL | Status: DC
  Administered 2012-12-20 – 2012-12-22 (×3): 10 mg via ORAL
  Filled 2012-12-20 (×3): qty 2

## 2012-12-20 MED ORDER — LACTATED RINGERS IV SOLN
INTRAVENOUS | Status: DC
  Administered 2012-12-20: 10:00:00 via INTRAVENOUS

## 2012-12-20 MED ORDER — CEFUROXIME SODIUM 1.5 G IJ SOLR
INTRAMUSCULAR | Status: AC
  Filled 2012-12-20: qty 1.5

## 2012-12-20 MED ORDER — CARVEDILOL 25 MG PO TABS
25.0000 mg | ORAL_TABLET | Freq: Two times a day (BID) | ORAL | Status: DC
  Administered 2012-12-20 – 2012-12-23 (×6): 25 mg via ORAL
  Filled 2012-12-20 (×9): qty 1

## 2012-12-20 MED ORDER — ADULT MULTIVITAMIN W/MINERALS CH
1.0000 | ORAL_TABLET | Freq: Every day | ORAL | Status: DC
  Administered 2012-12-21 – 2012-12-23 (×3): 1 via ORAL
  Filled 2012-12-20 (×3): qty 1

## 2012-12-20 MED ORDER — ACETAMINOPHEN 325 MG PO TABS: 650.0000 mg | ORAL_TABLET | Freq: Four times a day (QID) | ORAL | Status: DC | PRN

## 2012-12-20 MED ORDER — OXYCODONE HCL 5 MG PO TABS: 5.0000 mg | ORAL_TABLET | Freq: Once | ORAL | Status: DC | PRN

## 2012-12-20 MED ORDER — HYDROMORPHONE HCL PF 1 MG/ML IJ SOLN
INTRAMUSCULAR | Status: AC
  Filled 2012-12-20: qty 1

## 2012-12-20 MED ORDER — DIPHENHYDRAMINE HCL 12.5 MG/5ML PO ELIX: 12.5000 mg | ORAL_SOLUTION | ORAL | Status: DC | PRN

## 2012-12-20 MED ORDER — SUCCINYLCHOLINE CHLORIDE 20 MG/ML IJ SOLN
INTRAMUSCULAR | Status: DC | PRN
  Administered 2012-12-20: 140 mg via INTRAVENOUS

## 2012-12-20 MED ORDER — ONDANSETRON HCL 4 MG/2ML IJ SOLN: 4.0000 mg | Freq: Once | INTRAMUSCULAR | Status: DC | PRN

## 2012-12-20 MED ORDER — ACETAMINOPHEN 10 MG/ML IV SOLN
1000.0000 mg | Freq: Four times a day (QID) | INTRAVENOUS | Status: AC
  Administered 2012-12-20 – 2012-12-21 (×4): 1000 mg via INTRAVENOUS
  Filled 2012-12-20 (×4): qty 100

## 2012-12-20 MED ORDER — METOCLOPRAMIDE HCL 5 MG/ML IJ SOLN: 5.0000 mg | Freq: Three times a day (TID) | INTRAMUSCULAR | Status: DC | PRN

## 2012-12-20 MED ORDER — FENTANYL CITRATE 0.05 MG/ML IJ SOLN
INTRAMUSCULAR | Status: AC
  Administered 2012-12-20: 100 ug
  Filled 2012-12-20: qty 2

## 2012-12-20 MED ORDER — DEXAMETHASONE SODIUM PHOSPHATE 10 MG/ML IJ SOLN
10.0000 mg | Freq: Every day | INTRAMUSCULAR | Status: AC
  Administered 2012-12-20: 10 mg via INTRAVENOUS
  Filled 2012-12-20 (×2): qty 1

## 2012-12-20 MED ORDER — INSULIN ASPART 100 UNIT/ML ~~LOC~~ SOLN
0.0000 [IU] | Freq: Every day | SUBCUTANEOUS | Status: DC
  Administered 2012-12-20: 3 [IU] via SUBCUTANEOUS
  Administered 2012-12-21 – 2012-12-22 (×2): 2 [IU] via SUBCUTANEOUS

## 2012-12-20 MED ORDER — HYDROMORPHONE HCL PF 1 MG/ML IJ SOLN
0.2500 mg | INTRAMUSCULAR | Status: DC | PRN
  Administered 2012-12-20 (×4): 0.5 mg via INTRAVENOUS

## 2012-12-20 MED ORDER — NEOSTIGMINE METHYLSULFATE 1 MG/ML IJ SOLN
INTRAMUSCULAR | Status: DC | PRN
  Administered 2012-12-20: 4 mg via INTRAVENOUS

## 2012-12-20 MED ORDER — HYDROMORPHONE HCL PF 1 MG/ML IJ SOLN
0.5000 mg | INTRAMUSCULAR | Status: DC | PRN
  Administered 2012-12-20 – 2012-12-21 (×2): 1 mg via INTRAVENOUS
  Filled 2012-12-20 (×2): qty 1

## 2012-12-20 MED ORDER — INSULIN ASPART 100 UNIT/ML ~~LOC~~ SOLN
0.0000 [IU] | Freq: Three times a day (TID) | SUBCUTANEOUS | Status: DC
  Administered 2012-12-20: 5 [IU] via SUBCUTANEOUS
  Administered 2012-12-21: 12:00:00 via SUBCUTANEOUS
  Administered 2012-12-21: 5 [IU] via SUBCUTANEOUS
  Administered 2012-12-21: 15 [IU] via SUBCUTANEOUS
  Administered 2012-12-23: 3 [IU] via SUBCUTANEOUS
  Administered 2012-12-23: 2 [IU] via SUBCUTANEOUS

## 2012-12-20 MED ORDER — METOCLOPRAMIDE HCL 10 MG PO TABS: 5.0000 mg | ORAL_TABLET | Freq: Three times a day (TID) | ORAL | Status: DC | PRN

## 2012-12-20 MED ORDER — EXENATIDE 10 MCG/0.04ML ~~LOC~~ SOPN
10.0000 ug | PEN_INJECTOR | Freq: Two times a day (BID) | SUBCUTANEOUS | Status: DC
  Administered 2012-12-21 – 2012-12-23 (×4): 10 ug via SUBCUTANEOUS

## 2012-12-20 MED ORDER — DEXAMETHASONE SODIUM PHOSPHATE 10 MG/ML IJ SOLN
INTRAMUSCULAR | Status: AC
  Filled 2012-12-20: qty 1

## 2012-12-20 MED ORDER — OXYCODONE HCL 5 MG/5ML PO SOLN: 5.0000 mg | Freq: Once | ORAL | Status: DC | PRN

## 2012-12-20 MED ORDER — SIMVASTATIN 40 MG PO TABS
40.0000 mg | ORAL_TABLET | Freq: Every evening | ORAL | Status: DC
  Administered 2012-12-20 – 2012-12-22 (×3): 40 mg via ORAL
  Filled 2012-12-20 (×4): qty 1

## 2012-12-20 MED ORDER — LACTATED RINGERS IV SOLN
INTRAVENOUS | Status: DC | PRN
  Administered 2012-12-20 (×2): via INTRAVENOUS

## 2012-12-20 MED ORDER — PROPOFOL 10 MG/ML IV BOLUS
INTRAVENOUS | Status: DC | PRN
  Administered 2012-12-20: 150 mg via INTRAVENOUS

## 2012-12-20 MED ORDER — BUPIVACAINE-EPINEPHRINE 0.25% -1:200000 IJ SOLN
INTRAMUSCULAR | Status: DC | PRN
  Administered 2012-12-20: 30 mL

## 2012-12-20 MED ORDER — CVS SPECTRAVITE SENIOR PO TABS: 1.0000 | ORAL_TABLET | Freq: Every morning | ORAL | Status: DC

## 2012-12-20 MED ORDER — OXYCODONE HCL 5 MG PO TABS
5.0000 mg | ORAL_TABLET | ORAL | Status: DC | PRN
  Administered 2012-12-20 – 2012-12-22 (×7): 10 mg via ORAL
  Administered 2012-12-22: 5 mg via ORAL
  Administered 2012-12-22: 10 mg via ORAL
  Administered 2012-12-22: 5 mg via ORAL
  Administered 2012-12-22 – 2012-12-23 (×4): 10 mg via ORAL
  Filled 2012-12-20 (×13): qty 2

## 2012-12-20 MED ORDER — POTASSIUM CHLORIDE CRYS ER 20 MEQ PO TBCR
20.0000 meq | EXTENDED_RELEASE_TABLET | Freq: Every day | ORAL | Status: DC
  Administered 2012-12-20 – 2012-12-23 (×4): 20 meq via ORAL
  Filled 2012-12-20 (×4): qty 1

## 2012-12-20 MED ORDER — BUPIVACAINE-EPINEPHRINE PF 0.5-1:200000 % IJ SOLN
INTRAMUSCULAR | Status: DC | PRN
  Administered 2012-12-20: 30 mL

## 2012-12-20 MED ORDER — ROCURONIUM BROMIDE 100 MG/10ML IV SOLN
INTRAVENOUS | Status: DC | PRN
  Administered 2012-12-20: 20 mg via INTRAVENOUS

## 2012-12-20 MED ORDER — ENOXAPARIN SODIUM 30 MG/0.3ML ~~LOC~~ SOLN
30.0000 mg | Freq: Two times a day (BID) | SUBCUTANEOUS | Status: DC
  Administered 2012-12-21 – 2012-12-23 (×5): 30 mg via SUBCUTANEOUS
  Filled 2012-12-20 (×9): qty 0.3

## 2012-12-20 MED ORDER — CEFUROXIME SODIUM 1.5 G IJ SOLR
INTRAMUSCULAR | Status: DC | PRN
  Administered 2012-12-20: 1.5 g

## 2012-12-20 MED ORDER — SODIUM CHLORIDE 0.9 % IR SOLN
Status: DC | PRN
  Administered 2012-12-20: 3000 mL

## 2012-12-20 MED ORDER — MEPERIDINE HCL 25 MG/ML IJ SOLN: 6.2500 mg | INTRAMUSCULAR | Status: DC | PRN

## 2012-12-20 MED ORDER — CLOBETASOL PROPIONATE 0.05 % EX CREA
1.0000 "application " | TOPICAL_CREAM | Freq: Two times a day (BID) | CUTANEOUS | Status: DC
  Administered 2012-12-20 – 2012-12-23 (×6): 1 via TOPICAL
  Filled 2012-12-20: qty 15

## 2012-12-20 MED ORDER — TERBINAFINE HCL 1 % EX CREA
1.0000 "application " | TOPICAL_CREAM | Freq: Two times a day (BID) | CUTANEOUS | Status: DC
  Administered 2012-12-20 – 2012-12-23 (×5): 1 via TOPICAL
  Filled 2012-12-20 (×2): qty 12

## 2012-12-20 MED ORDER — CELECOXIB 200 MG PO CAPS
200.0000 mg | ORAL_CAPSULE | Freq: Two times a day (BID) | ORAL | Status: DC
  Administered 2012-12-20 – 2012-12-23 (×6): 200 mg via ORAL
  Filled 2012-12-20 (×8): qty 1

## 2012-12-20 MED ORDER — 0.9 % SODIUM CHLORIDE (POUR BTL) OPTIME
TOPICAL | Status: DC | PRN
  Administered 2012-12-20: 1000 mL

## 2012-12-20 MED ORDER — ACETAMINOPHEN 650 MG RE SUPP: 650.0000 mg | Freq: Four times a day (QID) | RECTAL | Status: DC | PRN

## 2012-12-20 MED ORDER — LISINOPRIL 40 MG PO TABS
40.0000 mg | ORAL_TABLET | Freq: Two times a day (BID) | ORAL | Status: DC
  Administered 2012-12-20 – 2012-12-23 (×6): 40 mg via ORAL
  Filled 2012-12-20 (×8): qty 1

## 2012-12-20 MED ORDER — ONDANSETRON HCL 4 MG/2ML IJ SOLN: 4.0000 mg | Freq: Four times a day (QID) | INTRAMUSCULAR | Status: DC | PRN

## 2012-12-20 MED ORDER — DEXAMETHASONE 6 MG PO TABS
10.0000 mg | ORAL_TABLET | Freq: Every day | ORAL | Status: AC
  Administered 2012-12-21 – 2012-12-22 (×2): 10 mg via ORAL
  Filled 2012-12-20 (×2): qty 1

## 2012-12-20 MED ORDER — ALUM & MAG HYDROXIDE-SIMETH 200-200-20 MG/5ML PO SUSP: 30.0000 mL | ORAL | Status: DC | PRN

## 2012-12-20 MED ORDER — ONDANSETRON HCL 4 MG PO TABS: 4.0000 mg | ORAL_TABLET | Freq: Four times a day (QID) | ORAL | Status: DC | PRN

## 2012-12-20 MED ORDER — MENTHOL 3 MG MT LOZG: 1.0000 | LOZENGE | OROMUCOSAL | Status: DC | PRN

## 2012-12-20 MED ORDER — FENTANYL CITRATE 0.05 MG/ML IJ SOLN
INTRAMUSCULAR | Status: DC | PRN
  Administered 2012-12-20: 100 ug via INTRAVENOUS
  Administered 2012-12-20: 50 ug via INTRAVENOUS
  Administered 2012-12-20: 150 ug via INTRAVENOUS
  Administered 2012-12-20: 50 ug via INTRAVENOUS
  Administered 2012-12-20: 100 ug via INTRAVENOUS

## 2012-12-20 SURGICAL SUPPLY — 67 items
BANDAGE ESMARK 6X9 LF (GAUZE/BANDAGES/DRESSINGS) ×1 IMPLANT
BLADE SAGITTAL 25.0X1.19X90 (BLADE) ×2 IMPLANT
BLADE SAW SGTL 11.0X1.19X90.0M (BLADE) IMPLANT
BLADE SAW SGTL 13.0X1.19X90.0M (BLADE) ×2 IMPLANT
BLADE SURG 10 STRL SS (BLADE) ×4 IMPLANT
BNDG ELASTIC 6X15 VLCR STRL LF (GAUZE/BANDAGES/DRESSINGS) ×2 IMPLANT
BNDG ESMARK 6X9 LF (GAUZE/BANDAGES/DRESSINGS) ×2
BOWL SMART MIX CTS (DISPOSABLE) ×2 IMPLANT
CEMENT HV SMART SET (Cement) ×4 IMPLANT
CLOTH BEACON ORANGE TIMEOUT ST (SAFETY) ×2 IMPLANT
COVER BACK TABLE 24X17X13 BIG (DRAPES) IMPLANT
COVER SURGICAL LIGHT HANDLE (MISCELLANEOUS) ×2 IMPLANT
CUFF TOURNIQUET SINGLE 34IN LL (TOURNIQUET CUFF) IMPLANT
CUFF TOURNIQUET SINGLE 44IN (TOURNIQUET CUFF) IMPLANT
DRAPE EXTREMITY T 121X128X90 (DRAPE) ×2 IMPLANT
DRAPE INCISE IOBAN 66X45 STRL (DRAPES) ×2 IMPLANT
DRAPE PROXIMA HALF (DRAPES) ×2 IMPLANT
DRAPE U-SHAPE 47X51 STRL (DRAPES) ×2 IMPLANT
DRSG ADAPTIC 3X8 NADH LF (GAUZE/BANDAGES/DRESSINGS) ×2 IMPLANT
DRSG PAD ABDOMINAL 8X10 ST (GAUZE/BANDAGES/DRESSINGS) ×4 IMPLANT
DURAPREP 26ML APPLICATOR (WOUND CARE) ×2 IMPLANT
ELECT CAUTERY BLADE 6.4 (BLADE) ×2 IMPLANT
ELECT REM PT RETURN 9FT ADLT (ELECTROSURGICAL) ×2
ELECTRODE REM PT RTRN 9FT ADLT (ELECTROSURGICAL) ×1 IMPLANT
EVACUATOR 1/8 PVC DRAIN (DRAIN) ×2 IMPLANT
FACESHIELD LNG OPTICON STERILE (SAFETY) ×2 IMPLANT
GLOVE BIO SURGEON STRL SZ7 (GLOVE) ×4 IMPLANT
GLOVE BIOGEL PI IND STRL 7.0 (GLOVE) ×1 IMPLANT
GLOVE BIOGEL PI IND STRL 7.5 (GLOVE) ×1 IMPLANT
GLOVE BIOGEL PI INDICATOR 7.0 (GLOVE) ×1
GLOVE BIOGEL PI INDICATOR 7.5 (GLOVE) ×1
GLOVE SS BIOGEL STRL SZ 7.5 (GLOVE) ×1 IMPLANT
GLOVE SUPERSENSE BIOGEL SZ 7.5 (GLOVE) ×1
GOWN PREVENTION PLUS XLARGE (GOWN DISPOSABLE) ×6 IMPLANT
GOWN STRL NON-REIN LRG LVL3 (GOWN DISPOSABLE) ×2 IMPLANT
HANDPIECE INTERPULSE COAX TIP (DISPOSABLE) ×1
HOOD PEEL AWAY FACE SHEILD DIS (HOOD) ×6 IMPLANT
IMMOBILIZER KNEE 20 (SOFTGOODS) ×2
IMMOBILIZER KNEE 20 THIGH 36 (SOFTGOODS) ×1 IMPLANT
IMMOBILIZER KNEE 22 UNIV (SOFTGOODS) ×2 IMPLANT
INSERT CUSHION PRONEVIEW LG (MISCELLANEOUS) ×2 IMPLANT
KIT BASIN OR (CUSTOM PROCEDURE TRAY) ×2 IMPLANT
KIT ROOM TURNOVER OR (KITS) ×2 IMPLANT
MANIFOLD NEPTUNE II (INSTRUMENTS) ×2 IMPLANT
NS IRRIG 1000ML POUR BTL (IV SOLUTION) ×2 IMPLANT
PACK TOTAL JOINT (CUSTOM PROCEDURE TRAY) ×2 IMPLANT
PAD ARMBOARD 7.5X6 YLW CONV (MISCELLANEOUS) ×2 IMPLANT
PAD CAST 4YDX4 CTTN HI CHSV (CAST SUPPLIES) ×1 IMPLANT
PADDING CAST COTTON 4X4 STRL (CAST SUPPLIES) ×1
PADDING CAST COTTON 6X4 STRL (CAST SUPPLIES) ×2 IMPLANT
POSITIONER HEAD PRONE TRACH (MISCELLANEOUS) ×2 IMPLANT
RUBBERBAND STERILE (MISCELLANEOUS) ×2 IMPLANT
SET HNDPC FAN SPRY TIP SCT (DISPOSABLE) ×1 IMPLANT
SPONGE GAUZE 4X4 12PLY (GAUZE/BANDAGES/DRESSINGS) ×2 IMPLANT
STRIP CLOSURE SKIN 1/2X4 (GAUZE/BANDAGES/DRESSINGS) ×2 IMPLANT
SUCTION FRAZIER TIP 10 FR DISP (SUCTIONS) ×2 IMPLANT
SUT ETHIBOND NAB CT1 #1 30IN (SUTURE) ×4 IMPLANT
SUT MNCRL AB 3-0 PS2 18 (SUTURE) ×2 IMPLANT
SUT VIC AB 0 CT1 27 (SUTURE) ×2
SUT VIC AB 0 CT1 27XBRD ANBCTR (SUTURE) ×2 IMPLANT
SUT VIC AB 2-0 CT1 27 (SUTURE) ×2
SUT VIC AB 2-0 CT1 TAPERPNT 27 (SUTURE) ×2 IMPLANT
SYR 30ML SLIP (SYRINGE) ×2 IMPLANT
TOWEL OR 17X24 6PK STRL BLUE (TOWEL DISPOSABLE) ×2 IMPLANT
TOWEL OR 17X26 10 PK STRL BLUE (TOWEL DISPOSABLE) ×2 IMPLANT
TRAY FOLEY CATH 14FR (SET/KITS/TRAYS/PACK) ×2 IMPLANT
WATER STERILE IRR 1000ML POUR (IV SOLUTION) ×4 IMPLANT

## 2012-12-20 NOTE — Evaluation (Addendum)
Physical Therapy Evaluation Patient Details Name: Bradley Alvarez MRN: [id number] DOB: 10/09/1941 Today's Date: 12/20/2012 Time: 7829-5621 PT Time Calculation (min): 44 min  PT Assessment / Plan / Recommendation Clinical Impression  Patient is a 72 yo male s/p Lt. TKA.  Will benefit from acute PT to maximize independence and increase activity tolerance prior to discharge to next level of care.  Wife unable to physically assist patient.  Recommend ST-SNF for continued therapy at discharge.    PT Assessment  Patient needs continued PT services    Follow Up Recommendations  SNF    Does the patient have the potential to tolerate intense rehabilitation      Barriers to Discharge Decreased caregiver support      Equipment Recommendations  Rolling walker with 5" wheels (bariatric)    Recommendations for Other Services     Frequency 7X/week    Precautions / Restrictions Precautions Precautions: Knee Precaution Booklet Issued: Yes (comment) Precaution Comments: Provided education on precautions Required Braces or Orthoses: Knee Immobilizer - Left Knee Immobilizer - Left: On except when in CPM;Discontinue once straight leg raise with < 10 degree lag Restrictions Weight Bearing Restrictions: Yes LLE Weight Bearing: Weight bearing as tolerated   Pertinent Vitals/Pain       Mobility  Bed Mobility Bed Mobility: Supine to Sit;Sitting - Scoot to Edge of Bed;Sit to Supine Supine to Sit: 3: Mod assist;HOB elevated Sitting - Scoot to Edge of Bed: 4: Min assist Sit to Supine: 3: Mod assist;HOB elevated Details for Bed Mobility Assistance: Verbal cues for technique.  Assist to move LLE and to raise trunk off of bed.  Patient sat at EOB x 12 minutes with good balance.  Performed ankle pumps in seated position. Transfers Transfers: Not assessed       Exercises Total Joint Exercises Ankle Circles/Pumps: AROM;Both;10 reps;Seated   PT Diagnosis: Difficulty walking;Acute pain  PT Problem  List: Decreased strength;Decreased range of motion;Decreased activity tolerance;Decreased balance;Decreased mobility;Decreased knowledge of use of DME;Decreased knowledge of precautions;Obesity;Pain PT Treatment Interventions: DME instruction;Gait training;Functional mobility training;Therapeutic exercise;Patient/family education   PT Goals Acute Rehab PT Goals PT Goal Formulation: With patient Time For Goal Achievement: 01/03/13 Potential to Achieve Goals: Good Pt will go Supine/Side to Sit: with modified independence;with HOB 0 degrees PT Goal: Supine/Side to Sit - Progress: Goal set today Pt will go Sit to Supine/Side: Independently;with HOB 0 degrees PT Goal: Sit to Supine/Side - Progress: Goal set today Pt will go Sit to Stand: with supervision;with upper extremity assist PT Goal: Sit to Stand - Progress: Goal set today Pt will go Stand to Sit: with supervision;with upper extremity assist PT Goal: Stand to Sit - Progress: Goal set today Pt will Ambulate: 51 - 150 feet;with supervision;with rolling walker PT Goal: Ambulate - Progress: Goal set today Pt will Perform Home Exercise Program: with supervision, verbal cues required/provided PT Goal: Perform Home Exercise Program - Progress: Goal set today  Visit Information  Last PT Received On: 12/20/12 Assistance Needed: +2    Subjective Data  Subjective: I'm feeling pretty good now. Patient Stated Goal: To walk.    Prior Functioning  Home Living Lives With: Spouse Available Help at Discharge: Skilled Nursing Facility Additional Comments: Patient planning to go to [facility] Prior Function Level of Independence: Independent with assistive device(s) (Used crutch or cane at times) Able to Take Stairs?: Yes Driving: Yes Vocation: Retired Musician: No difficulties    Cognition  Overall Cognitive Status: Appears within functional limits for tasks assessed/performed  Arousal/Alertness:  Awake/alert Orientation Level: Oriented X4 / Intact Behavior During Session: WFL for tasks performed    Extremity/Trunk Assessment Right Upper Extremity Assessment RUE ROM/Strength/Tone: WFL for tasks assessed RUE Sensation: WFL - Light Touch Left Upper Extremity Assessment LUE ROM/Strength/Tone: WFL for tasks assessed LUE Sensation: WFL - Light Touch Right Lower Extremity Assessment RLE ROM/Strength/Tone: WFL for tasks assessed RLE Sensation: WFL - Light Touch Left Lower Extremity Assessment LLE ROM/Strength/Tone: Deficits;Unable to fully assess;Due to pain LLE ROM/Strength/Tone Deficits: Able to assist with moving LLE off of and onto bed/CPM.   Balance Balance Balance Assessed: Yes Static Sitting Balance Static Sitting - Balance Support: No upper extremity supported;Feet supported Static Sitting - Level of Assistance: 5: Stand by assistance Static Sitting - Comment/# of Minutes: 12  End of Session PT - End of Session Equipment Utilized During Treatment: Left knee immobilizer;Oxygen Activity Tolerance: Patient tolerated treatment well Patient left: in bed;in CPM;with call bell/phone within reach;with family/visitor present Nurse Communication: Mobility status (In CPM) CPM Left Knee CPM Left Knee: On Left Knee Flexion (Degrees): 60  Left Knee Extension (Degrees): 0   GP     Vena Austria 12/20/2012, 7:40 PM Durenda Hurt. Renaldo Fiddler, [facility] Pager [phone]

## 2012-12-20 NOTE — Op Note (Addendum)
MRN:     [id number] DOB/AGE:    12-19-1940 / 72 y.o.       OPERATIVE REPORT    DATE OF PROCEDURE:  12/20/2012       PREOPERATIVE DIAGNOSIS:   DJD LEFT KNEE      There is no height or weight on file to calculate BMI.                                                        POSTOPERATIVE DIAGNOSIS:   DJD LEFT KNEE                                                                      PROCEDURE:  Procedure(s): TOTAL KNEE ARTHROPLASTY Using Depuy Sigma RP implants #5 Femur, #6Tibia, 15mm sigma RP bearing, 38 Patella     SURGEON: Anfernee Peschke A    ASSISTANT:  Kirstin Shepperson PA-C   (Present and scrubbed throughout the case, critical for assistance with exposure, retraction, instrumentation, and closure.)         ANESTHESIA: GET with Femoral Nerve Block  DRAINS: foley, 2 medium hemovac in knee   TOURNIQUET TIME:   COMPLICATIONS:  None     SPECIMENS: None   INDICATIONS FOR PROCEDURE: The patient has  DJD LEFT KNEE, varus deformities, XR shows bone on bone arthritis. Patient has failed all conservative measures including anti-inflammatory medicines, narcotics, attempts at  exercise and weight loss, cortisone injections and viscosupplementation.  Risks and benefits of surgery have been discussed, questions answered.   DESCRIPTION OF PROCEDURE: The patient identified by armband, received  right femoral nerve block and IV antibiotics, in the holding area at [facility]. Patient taken to the operating room, appropriate anesthetic  monitors were attached General endotracheal anesthesia induced with  the patient in supine position, Foley catheter was inserted. Tourniquet  applied high to the operative thigh. Lateral post and foot positioner  applied to the table, the lower extremity was then prepped and draped  in usual sterile fashion from the ankle to the tourniquet. Time-out procedure was performed. The limb was wrapped with an Esmarch bandage and the tourniquet inflated to 365  mmHg. We began the operation by making the anterior midline incision starting at handbreadth above the patella going over the patella 1 cm medial to and  4 cm distal to the tibial tubercle. Small bleeders in the skin and the  subcutaneous tissue identified and cauterized. Transverse retinaculum was incised and reflected medially and a medial parapatellar arthrotomy was accomplished. the patella was everted and theprepatellar fat pad resected. The superficial medial collateral  ligament was then elevated from anterior to posterior along the proximal  flare of the tibia and anterior half of the menisci resected. The knee was hyperflexed exposing bone on bone arthritis. Peripheral and notch osteophytes as well as the cruciate ligaments were then resected. We continued to  work our way around posteriorly along the proximal tibia, and externally  rotated the tibia subluxing it out from underneath the femur. A McHale  retractor was placed through the notch and a lateral  Hohmann retractor  placed, and we then drilled through the proximal tibia in line with the  axis of the tibia followed by an intramedullary guide rod and 2-degree  posterior slope cutting guide. The tibial cutting guide was pinned into place  allowing resection of 4 mm of bone medially and about 6 mm of bone  laterally because of her varus deformity. Satisfied with the tibial resection, we then  entered the distal femur 2 mm anterior to the PCL origin with the  intramedullary guide rod and applied the distal femoral cutting guide  set at 11mm, with 5 degrees of valgus. This was pinned along the  epicondylar axis. At this point, the distal femoral cut was accomplished without difficulty. We then sized for a #5 femoral component and pinned the guide in 3 degrees of external rotation.The chamfer cutting guide was pinned into place. The anterior, posterior, and chamfer cuts were accomplished without difficulty followed by  the Sigma RP box  cutting guide and the box cut. We also removed posterior osteophytes from the posterior femoral condyles. At this  time, the knee was brought into full extension. We checked our  extension and flexion gaps and found them symmetric at 15mm.  The patella thickness measured at 25 mm. We set the cutting guide at 15 and removed the posterior 9.5-10 mm  of the patella sized for 38 button and drilled the lollipop. The knee  was then once again hyperflexed exposing the proximal tibia. We sized for a #6 tibial base plate, applied the smokestack and the conical reamer followed by the the Delta fin keel punch. We then hammered into place the Sigma RP trial femoral component, inserted a 15-mm trial bearing, trial patellar button, and took the knee through range of motion from 0-130 degrees. No thumb pressure was required for patellar  tracking. At this point, all trial components were removed, a double batch of DePuy HV cement with 1500 mg of Zinacef was mixed and applied to all bony metallic mating surfaces except for the posterior condyles of the femur itself. In order, we  hammered into place the tibial tray and removed excess cement, the femoral component and removed excess cement, a 15-mm Sigma RP bearing  was inserted, and the knee brought to full extension with compression.  The patellar button was clamped into place, and excess cement  removed. While the cement cured the wound was irrigated out with normal saline solution pulse lavage, and medium Hemovac drains were placed.. Ligament stability and patellar tracking were checked and found to be excellent. The tourniquet was then released and hemostasis was obtained with cautery. The parapatellar arthrotomy was closed with  #1 ethibond suture. The subcutaneous tissue with 0 and 2-0 undyed  Vicryl suture, and 4-0 Monocryl.. A dressing of Xeroform,  4 x 4, dressing sponges, Webril, and Ace wrap applied. Needle and sponge count were correct times 2.The patient  awakened, extubated, and taken to recovery room without difficulty. Vascular status was normal, pulses 2+ and symmetric.   Bradley Alvarez A 12/20/2012, 12:58 PM

## 2012-12-20 NOTE — Transfer of Care (Signed)
Immediate Anesthesia Transfer of Care Note  Patient: Bradley Alvarez  Procedure(s) Performed: Procedure(s) (LRB) with comments: TOTAL KNEE ARTHROPLASTY (Left) - left total knee arthroplasty  Patient Location: PACU  Anesthesia Type:GA combined with regional for post-op pain  Level of Consciousness: sedated  Airway & Oxygen Therapy: Patient Spontanous Breathing and Patient connected to face mask oxygen  Post-op Assessment: Report given to PACU RN and Post -op Vital signs reviewed and stable  Post vital signs: Reviewed and stable  Complications: No apparent anesthesia complications

## 2012-12-20 NOTE — Progress Notes (Signed)
Orthopedic Tech Progress Note Patient Details:  Bradley Alvarez 05/19/1941 [id number]  CPM Left Knee CPM Left Knee: On Left Knee Flexion (Degrees): 60  Left Knee Extension (Degrees): 0    Shawnie Pons 12/20/2012, 3:00 PM

## 2012-12-20 NOTE — Anesthesia Procedure Notes (Addendum)
Anesthesia Regional Block:  Femoral nerve block  Pre-Anesthetic Checklist: ,, timeout performed, Correct Patient, Correct Site, Correct Laterality, Correct Procedure, Correct Position, site marked, Risks and benefits discussed,  Surgical consent,  Pre-op evaluation,  At surgeon's request and post-op pain management  Laterality: Left  Prep: chloraprep       Needles:  Injection technique: Single-shot  Needle Type: Echogenic Stimulator Needle          Additional Needles:  Procedures: ultrasound guided (picture in chart) and nerve stimulator Femoral nerve block  Nerve Stimulator or Paresthesia:  Response: 0.4 mA,   Additional Responses:   Narrative:  Start time: 12/20/2012 10:15 AM End time: 12/20/2012 10:30 AM Injection made incrementally with aspirations every 5 mL.  Performed by: Personally  Anesthesiologist: Arta Bruce MD  Additional Notes: Monitors applied. Patient sedated. Sterile prep and drape,hand hygiene and sterile gloves were used. Relevant anatomy identified.Needle position confirmed.Local anesthetic injected incrementally after negative aspiration. Local anesthetic spread visualized around nerve(s). Vascular puncture avoided. No complications. Image printed for medical record.The patient tolerated the procedure well.       Femoral nerve block Procedure Name: Intubation Date/Time: 12/20/2012 11:12 AM Performed by: Gwenyth Allegra Pre-anesthesia Checklist: Patient being monitored, Suction available, Emergency Drugs available, Patient identified and Timeout performed Patient Re-evaluated:Patient Re-evaluated prior to inductionOxygen Delivery Method: Circle system utilized Preoxygenation: Pre-oxygenation with 100% oxygen Intubation Type: IV induction Laryngoscope Size: Mac and 4 Grade View: Grade I Tube type: Oral Tube size: 8.0 mm Number of attempts: 1 Airway Equipment and Method: Stylet Placement Confirmation: ETT inserted through vocal cords under direct  vision,  breath sounds checked- equal and bilateral and positive ETCO2 Secured at: 23 cm Tube secured with: Tape Dental Injury: Teeth and Oropharynx as per pre-operative assessment

## 2012-12-20 NOTE — H&P (View-Only) (Signed)
TOTAL KNEE ADMISSION H&P  Patient is being admitted for left total knee arthroplasty.  Subjective:  Chief Complaint:left knee pain.  HPI: Bradley Alvarez, 72 y.o. male, has a history of pain and functional disability in the left knee due to arthritis and has failed non-surgical conservative treatments for greater than 12 weeks to includeNSAID's and/or analgesics, corticosteriod injections, viscosupplementation injections, flexibility and strengthening excercises, supervised PT with diminished ADL's post treatment, use of assistive devices, weight reduction as appropriate and activity modification.  Onset of symptoms was gradual, starting >10 years ago with gradually worsening course since that time. The patient noted no past surgery on the left knee(s).  Patient currently rates pain in the left knee(s) at 10 out of 10 with activity. Patient has night pain, worsening of pain with activity and weight bearing, pain that interferes with activities of daily living, crepitus and joint swelling.  Patient has evidence of subchondral sclerosis, periarticular osteophytes and joint space narrowing by imaging studies.  There is no active infection.  Patient Active Problem List   Diagnosis Date Noted  . Hypertension   . Diabetes mellitus type 2, uncontrolled, with complications   . Hypercholesteremia   . Lichen planus   . Left knee DJD   . Morbid obesity with BMI of 45.0-49.9, adult   . Sleep apnea, obstructive   . Benign prostate hyperplasia   . Diabetes mellitus 05/29/2011   Past Medical History  Diagnosis Date  . Hypertension   . Diabetes mellitus type 2, uncontrolled, with complications   . Hypercholesteremia   . Benign prostate hyperplasia   . Sleep apnea, obstructive   . Morbid obesity with BMI of 45.0-49.9, adult   . Lichen planus   . Left knee DJD     Past Surgical History  Procedure Date  . 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 Suprapubic prostatectomy      (Not in a  hospital admission) No Known Allergies   Current Outpatient Prescriptions on File Prior to Visit  Medication Sig Dispense Refill  . carvedilol (COREG) 25 MG tablet Take 25 mg by mouth 2 (two) times daily with a meal.      . exenatide (BYETTA) 10 MCG/0.04ML SOLN Inject 10 mcg into the skin 2 (two) times daily with a meal.      . felodipine (PLENDIL) 5 MG 24 hr tablet Take 10 mg by mouth daily.      . furosemide (LASIX) 40 MG tablet Take 40 mg by mouth daily.      . glimepiride (AMARYL) 4 MG tablet Take 4 mg by mouth daily before breakfast.      . lisinopril (PRINIVIL,ZESTRIL) 40 MG tablet Take 40 mg by mouth daily.      . metFORMIN (GLUCOPHAGE) 1000 MG tablet Take 1,000 mg by mouth 2 (two) times daily with a meal.      . potassium chloride SA (K-DUR,KLOR-CON) 20 MEQ tablet Take 20 mEq by mouth 2 (two) times daily.      . simvastatin (ZOCOR) 40 MG tablet Take 40 mg by mouth every evening.      . triamterene-hydrochlorothiazide (MAXZIDE-25) 37.5-25 MG per tablet Take 1 tablet by mouth daily.         History  Substance Use Topics  . Smoking status: Former Smoker    Quit date: 12/14/1971  . Smokeless tobacco: Not on file  . Alcohol Use: Yes     Comment: 1/5 of liqour will last him about 2 weeks      Family History  Problem Relation Age of Onset  . Hypertension    . Diabetes Mellitus II    . Stroke    . Heart disease       Review of Systems  Constitutional: Negative.   HENT: Positive for hearing loss.   Eyes: Negative.   Respiratory: Negative.   Cardiovascular:       Irregular heart bead  Gastrointestinal: Negative.   Genitourinary: Negative.   Musculoskeletal: Positive for back pain and joint pain.  Skin:       lichin planus in his privates  Neurological: Negative.     Objective:  Physical Exam  Constitutional: He is oriented to person, place, and time. He appears well-developed and well-nourished.  HENT:  Head: Normocephalic and atraumatic.  Mouth/Throat: Oropharynx  is clear and moist.  Eyes: EOM are normal. Pupils are equal, round, and reactive to light.  Neck: Neck supple.  Cardiovascular: Normal rate and regular rhythm.   Murmur heard. Respiratory: Effort normal.  GI: Soft. Bowel sounds are normal.  Genitourinary:       Lichen planus on his scrotum  Musculoskeletal:       He's ambulating with assistance of a crutch. He has active range of motion 0-90 degrees medial and lateral joint line tenderness and normal patella tracking. Right knee has well healed total knee incision minimal swelling and minimal pain range of motion 0-105 degrees. He has 2+ dorsalis pedis pulses.  Neurological: He is alert and oriented to person, place, and time.  Skin: Skin is warm and dry. Rash noted.  Psychiatric: He has a normal mood and affect. His behavior is normal.    Vital signs in last 24 hours: Last recorded: 01/13 1300   BP: 189/86 Pulse: 75  Temp: 97.2 F (36.2 C)    Height: 6' 2" (1.88 m) SpO2: 93  Weight: 160.12 kg (353 lb)     Labs:   Estimated Body mass index is 45.32 kg/(m^2) as calculated from the following:   Height as of this encounter: 6' 2"(1.88 m).   Weight as of this encounter: 353 lb(160.12 kg).   Imaging Review Plain radiographs demonstrate severe degenerative joint disease of the bilaterally knee(s). The overall alignment issignificant varus. The bone quality appears to be good for age and reported activity level.  Assessment/Plan:  End stage arthritis, left knee   The patient history, physical examination, clinical judgment of the provider and imaging studies are consistent with end stage degenerative joint disease of the left knee(s) and total knee arthroplasty is deemed medically necessary. The treatment options including medical management, injection therapy arthroscopy and arthroplasty were discussed at length. The risks and benefits of total knee arthroplasty were presented and reviewed. The risks due to aseptic loosening,  infection, stiffness, patella tracking problems, thromboembolic complications and other imponderables were discussed. The patient acknowledged the explanation, agreed to proceed with the plan and consent was signed. Patient is being admitted for inpatient treatment for surgery, pain control, PT, OT, prophylactic antibiotics, VTE prophylaxis, progressive ambulation and ADL's and discharge planning. The patient is planning to be discharged to skilled nursing facility   

## 2012-12-20 NOTE — Preoperative (Signed)
Beta Blockers   Reason not to administer Beta Blockers:Not Applicable 

## 2012-12-20 NOTE — Anesthesia Preprocedure Evaluation (Signed)
Anesthesia Evaluation  Patient identified by MRN, date of birth, ID band Patient awake    Reviewed: Allergy & Precautions, H&P , NPO status , Patient's Chart, lab work & pertinent test results  Airway Mallampati: II TM Distance: >3 FB Neck ROM: Full    Dental   Pulmonary sleep apnea ,          Cardiovascular hypertension, Pt. on medications     Neuro/Psych    GI/Hepatic   Endo/Other  diabetes, Well Controlled, Type 2, Oral Hypoglycemic Agents  Renal/GU      Musculoskeletal   Abdominal   Peds  Hematology   Anesthesia Other Findings   Reproductive/Obstetrics                           Anesthesia Physical Anesthesia Plan  ASA: III  Anesthesia Plan: General   Post-op Pain Management:    Induction: Intravenous  Airway Management Planned: Oral ETT  Additional Equipment:   Intra-op Plan:   Post-operative Plan: Extubation in OR  Informed Consent: I have reviewed the patients History and Physical, chart, labs and discussed the procedure including the risks, benefits and alternatives for the proposed anesthesia with the patient or authorized representative who has indicated his/her understanding and acceptance.     Plan Discussed with: CRNA and Surgeon  Anesthesia Plan Comments:         Anesthesia Quick Evaluation

## 2012-12-20 NOTE — Plan of Care (Signed)
Problem: Consults Goal: Diagnosis- Total Joint Replacement Primary Total Knee Left     

## 2012-12-20 NOTE — Interval H&P Note (Signed)
History and Physical Interval Note:  12/20/2012 10:52 AM  Bradley Alvarez  has presented today for surgery, with the diagnosis of DJD LEFT KNEE  The various methods of treatment have been discussed with the patient and family. After consideration of risks, benefits and other options for treatment, the patient has consented to  Procedure(s) (LRB) with comments: TOTAL KNEE ARTHROPLASTY (Left) as a surgical intervention .  The patient's history has been reviewed, patient examined, no change in status, stable for surgery.  I have reviewed the patient's chart and labs.  Questions were answered to the patient's satisfaction.     Salvatore Marvel A

## 2012-12-21 LAB — CBC
MCH: 25.8 pg — ABNORMAL LOW (ref 26.0–34.0)
MCHC: 32.8 g/dL (ref 30.0–36.0)
MCV: 78.6 fL (ref 78.0–100.0)
Platelets: 147 10*3/uL — ABNORMAL LOW (ref 150–400)
RBC: 4.07 MIL/uL — ABNORMAL LOW (ref 4.22–5.81)
RDW: 14.9 % (ref 11.5–15.5)

## 2012-12-21 LAB — BASIC METABOLIC PANEL
CO2: 28 mEq/L (ref 19–32)
Calcium: 8.6 mg/dL (ref 8.4–10.5)
Creatinine, Ser: 1.09 mg/dL (ref 0.50–1.35)
GFR calc non Af Amer: 66 mL/min — ABNORMAL LOW (ref >90)
Sodium: 136 mEq/L (ref 135–145)

## 2012-12-21 LAB — GLUCOSE, CAPILLARY
Glucose-Capillary: 228 mg/dL — ABNORMAL HIGH (ref 70–99)
Glucose-Capillary: 351 mg/dL — ABNORMAL HIGH (ref 70–99)

## 2012-12-21 MED ORDER — INSULIN ASPART 100 UNIT/ML ~~LOC~~ SOLN
4.0000 [IU] | Freq: Three times a day (TID) | SUBCUTANEOUS | Status: DC
  Administered 2012-12-21 – 2012-12-23 (×4): 4 [IU] via SUBCUTANEOUS
  Administered 2012-12-23: 08:00:00 via SUBCUTANEOUS

## 2012-12-21 MED ORDER — GLIMEPIRIDE 4 MG PO TABS
4.0000 mg | ORAL_TABLET | Freq: Every day | ORAL | Status: DC
  Administered 2012-12-22 – 2012-12-23 (×2): 4 mg via ORAL
  Filled 2012-12-21 (×3): qty 1

## 2012-12-21 MED ORDER — METFORMIN HCL 500 MG PO TABS
1000.0000 mg | ORAL_TABLET | Freq: Two times a day (BID) | ORAL | Status: DC
  Administered 2012-12-21 – 2012-12-23 (×4): 1000 mg via ORAL
  Filled 2012-12-21 (×6): qty 2

## 2012-12-21 MED ORDER — INSULIN GLARGINE 100 UNIT/ML ~~LOC~~ SOLN
15.0000 [IU] | Freq: Every day | SUBCUTANEOUS | Status: DC
  Administered 2012-12-21 – 2012-12-22 (×2): 15 [IU] via SUBCUTANEOUS

## 2012-12-21 MED ORDER — INSULIN ASPART 100 UNIT/ML ~~LOC~~ SOLN
0.0000 [IU] | Freq: Three times a day (TID) | SUBCUTANEOUS | Status: DC
  Administered 2012-12-23: 3 [IU] via SUBCUTANEOUS

## 2012-12-21 NOTE — Progress Notes (Signed)
Clinical Social Work Department BRIEF PSYCHOSOCIAL ASSESSMENT 12/21/2012  Patient:  Bradley Alvarez, Bradley Alvarez     Account Number:  [id number]     Admit date:  12/20/2012  Clinical Social Worker:  Dennison Bulla  Date/Time:  12/21/2012 03:30 PM  Referred by:  Physician  Date Referred:  12/21/2012 Referred for  SNF Placement   Other Referral:   Interview type:  Patient Other interview type:    PSYCHOSOCIAL DATA Living Status:  FAMILY Admitted from facility:   Level of care:   Primary support name:  Marylon Primary support relationship to patient:  SPOUSE Degree of support available:   Strong    CURRENT CONCERNS Current Concerns  Post-Acute Placement   Other Concerns:    SOCIAL WORK ASSESSMENT / PLAN CSW received referral to assist with SNF placement. CSW reviewed chart which stated PT recommends SNF. CSW met with patient and family at bedside. Patient agreeable to family involvement.    CSW introduced myself and explained role. Patient reports wife works and when he had his right knee done he stayed at [facility]. Patient reports he has already spoken to [facility] about going to their facility at [facility]. CSW provided patient with SNF list and explained process and patient agreeable to county wide search to have alternative placements. CSW called [facility] about patient's interest. CSW called admitting to determine which insurance is primary due to patient having concerns. CSW left a message and will follow up.    CSW completed FL2 and faxed out. CSW will follow up with bed offers.   Assessment/plan status:  Psychosocial Support/Ongoing Assessment of Needs Other assessment/ plan:   Information/referral to community resources:   SNF list    PATIENT'S/FAMILY'S RESPONSE TO PLAN OF CARE: Patient alert and oriented. Patient and family engaged throughout assessment and thanked CSW for time.        Coverage for [facility]

## 2012-12-21 NOTE — Progress Notes (Signed)
Inpatient Diabetes Program Recommendations  AACE/ADA: New Consensus Statement on Inpatient Glycemic Control (2013)  Target Ranges:  Prepandial:   less than 140 mg/dL      Peak postprandial:   less than 180 mg/dL (1-2 hours)      Critically ill patients:  140 - 180 mg/dL  Results for WHITNEY, BINGAMAN (MRN [id number]) as of 12/21/2012 13:51  Ref. Range 12/20/2012 14:10 12/20/2012 17:09 12/20/2012 22:13 12/21/2012 06:56 12/21/2012 11:37  Glucose-Capillary Latest Range: 70-99 mg/dL 409 (H) 811 (H) 914 (H) 228 (H) 249 (H)   Inpatient Diabetes Program Recommendations Correction (SSI): Increase to RESISTANT scale during steroid therapy Thank you  Piedad Climes BSN, RN,CDE Inpatient Diabetes Coordinator [phone] (team pager)

## 2012-12-21 NOTE — Progress Notes (Signed)
Pt CBG are rising throughout the day, patients home medications on hold due to surgery. MD notified. Diabetes consult ordered. Lantus ordered at bedtime, meal coverage insulin, and PO home medications started this evening with dinner. Will continue to monitor, only complaint from PT is dry mouth.  Patient is on HS coverage.

## 2012-12-21 NOTE — Progress Notes (Signed)
Utilization review completed. Raechal Raben, RN, BSN. 

## 2012-12-21 NOTE — Progress Notes (Signed)
CARE MANAGEMENT NOTE 12/21/2012  Patient:  Bradley Alvarez, Bradley Alvarez   Account Number:  [id number]  Date Initiated:  12/21/2012  Documentation initiated by:  Vance Peper  Subjective/Objective Assessment:   72 yr old male s/p left total knee arthroplasty.     Action/Plan:   patient is for shortterm rehab at [facility]. Social Worker is aware.   Anticipated DC Date:  12/23/2012   Anticipated DC Plan:  SKILLED NURSING FACILITY  In-house referral  Clinical Social Worker      DC Planning Services  CM consult      Choice offered to / List presented to:             Status of service:  Completed, signed off Medicare Important Message given?   (If response is "NO", the following Medicare IM given date fields will be blank) Date Medicare IM given:   Date Additional Medicare IM given:    Discharge Disposition:  SKILLED NURSING FACILITY  Per UR Regulation:    If discussed at Long Length of Stay Meetings, dates discussed:    Comments:

## 2012-12-21 NOTE — Progress Notes (Signed)
Subjective: 1 Day Post-Op Procedure(s) (LRB): TOTAL KNEE ARTHROPLASTY (Left) Patient reports pain as moderate.   Pt has some concerns with elevated BP and glucose, usually on Metformin now on sliding scale insulin.  Dressing was reinforced last night for some excess drainage.    Objective: Vital signs in last 24 hours: Temp:  [97.4 F (36.3 C)-99 F (37.2 C)] 97.4 F (36.3 C) (01/21 0622) Pulse Rate:  [56-84] 71  (01/21 0622) Resp:  [15-21] 21  (01/21 0622) BP: (162-189)/(65-98) 176/80 mmHg (01/21 0700) SpO2:  [90 %-100 %] 97 % (01/21 0622)  Intake/Output from previous day: 01/20 0701 - 01/21 0700 In: 3130 [P.O.:30; I.V.:3100] Out: 1890 [Urine:1330; Drains:560] Intake/Output this shift:     Basename 12/21/12 0536 12/20/12 1649  HGB 10.5* 12.0*    Basename 12/21/12 0536 12/20/12 1649  WBC 9.5 10.3  RBC 4.07* 4.59  HCT 32.0* 36.6*  PLT 147* 152    Basename 12/21/12 0536 12/20/12 1649  NA 136 --  K 3.4* --  CL 98 --  CO2 28 --  BUN 14 --  CREATININE 1.09 1.14  GLUCOSE 225* --  CALCIUM 8.6 --   No results found for this basename: LABPT:2,INR:2 in the last 72 hours  Neurovascular intact Sensation intact distally Intact pulses distally Dorsiflexion/Plantar flexion intact Incision: moderate drainage No cellulitis present Compartment soft Dressing changed, hemovac pulled  Assessment/Plan: 1 Day Post-Op Procedure(s) (LRB): TOTAL KNEE ARTHROPLASTY (Left) Up with therapy Will discuss with team BP and glucose control while inpatient Plan for SNF placement following hospital d/c   Margart Sickles 12/21/2012, 9:04 AM

## 2012-12-21 NOTE — Progress Notes (Addendum)
Clinical Social Work Department CLINICAL SOCIAL WORK PLACEMENT NOTE 12/21/2012  Patient:  Bradley Alvarez, Bradley Alvarez  Account Number:  [id number] Admit date:  12/20/2012  Clinical Social Worker:  Unk Lightning, LCSW  Date/time:  12/21/2012 03:30 PM  Clinical Social Work is seeking post-discharge placement for this patient at the following level of care:   SKILLED NURSING   (*CSW will update this form in Epic as items are completed)   12/21/2012  Patient/family provided with [facility] Department of Clinical Social Work's list of facilities offering this level of care within the geographic area requested by the patient (or if unable, by the patient's family).  12/21/2012  Patient/family informed of their freedom to choose among providers that offer the needed level of care, that participate in Medicare, Medicaid or managed care program needed by the patient, have an available bed and are willing to accept the patient.  12/21/2012  Patient/family informed of [facility]' ownership interest in [facility], as well as of the fact that they are under no obligation to receive care at this facility.  PASARR submitted to EDS on existing # PASARR number received from EDS on   FL2 transmitted to all facilities in geographic area requested by pt/family on  12/21/2012 FL2 transmitted to all facilities within larger geographic area on   Patient informed that his/her managed care company has contracts with or will negotiate with  certain facilities, including the following:     Patient/family informed of bed offers received:  12/22/12 Patient chooses bed at [facility] Physician recommends and patient chooses bed at  [facility]  Patient to be transferred to [facility] on  12/23/12  Patient to be transferred to facility by [facility]  The following physician request were entered in Epic:   Additional Comments:

## 2012-12-21 NOTE — Progress Notes (Signed)
Physical Therapy Treatment Patient Details Name: Bradley Alvarez MRN: [id number] DOB: [birth year] Today's Date: 12/21/2012 Time: 7425-9563 PT Time Calculation (min): 28 min  PT Assessment / Plan / Recommendation Comments on Treatment Session  Patient progressing well this session and able to transfer to recliner and take a couple of small steps this morning. Pain is biggest limited factor at this time.     Follow Up Recommendations  SNF     Does the patient have the potential to tolerate intense rehabilitation     Barriers to Discharge        Equipment Recommendations  Rolling walker with 5" wheels;Other (comment) (bariatric)    Recommendations for Other Services    Frequency 7X/week   Plan Discharge plan remains appropriate;Frequency remains appropriate    Precautions / Restrictions Precautions Precautions: Knee Required Braces or Orthoses: Knee Immobilizer - Left Knee Immobilizer - Left: On except when in CPM;Discontinue once straight leg raise with < 10 degree lag Restrictions Weight Bearing Restrictions: Yes LLE Weight Bearing: Weight bearing as tolerated   Pertinent Vitals/Pain     Mobility  Bed Mobility Supine to Sit: 4: Min assist;With rails Sitting - Scoot to Edge of Bed: 4: Min guard Details for Bed Mobility Assistance: A for LLE. Cues for positioning and technique.  Transfers Transfers: Sit to Stand;Stand to Sit Sit to Stand: 4: Min assist;With upper extremity assist;From bed Stand to Sit: 4: Min assist;With upper extremity assist;With armrests;To chair/3-in-1 Details for Transfer Assistance: A to ensure stability and balance. Patient with excessive trunk flexion with standing. A to ensure balance and stability. Cues for safest technique and hand placement Ambulation/Gait Ambulation/Gait Assistance: 4: Min assist Ambulation Distance (Feet): 3 Feet Assistive device: Rolling walker Ambulation/Gait Assistance Details: Cues for step sequence and for posture.  Patient with "hop" steps. Patient limited by increase pain. Recliner bought behind patient prior to sitting. Gait Pattern: Decreased stance time - left;Decreased step length - right;Decreased step length - left;Step-to pattern    Exercises Total Joint Exercises Ankle Circles/Pumps: AROM;Both;10 reps Quad Sets: AROM;Left;10 reps Towel Squeeze: AROM;Left;10 reps Heel Slides: AAROM;Left;10 reps Hip ABduction/ADduction: AAROM;Left;10 reps Straight Leg Raises: AAROM;Left;10 reps   PT Diagnosis:    PT Problem List:   PT Treatment Interventions:     PT Goals Acute Rehab PT Goals PT Goal: Supine/Side to Sit - Progress: Progressing toward goal PT Goal: Sit to Stand - Progress: Progressing toward goal PT Goal: Stand to Sit - Progress: Progressing toward goal PT Goal: Ambulate - Progress: Progressing toward goal PT Goal: Perform Home Exercise Program - Progress: Progressing toward goal  Visit Information  Last PT Received On: 12/21/12 Assistance Needed: +2 (for safety and ambulation)    Subjective Data      Cognition  Overall Cognitive Status: Appears within functional limits for tasks assessed/performed Arousal/Alertness: Awake/alert Orientation Level: Appears intact for tasks assessed Behavior During Session: [facility] for tasks performed    Balance     End of Session PT - End of Session Equipment Utilized During Treatment: Gait belt;Left knee immobilizer Activity Tolerance: Patient tolerated treatment well Patient left: in chair;with call bell/phone within reach;with family/visitor present Nurse Communication: Mobility status CPM Left Knee CPM Left Knee: Off   GP     Fredrich Birks 12/21/2012, 10:12 AM 12/21/2012 Fredrich Birks PTA [phone] pager [phone] office

## 2012-12-21 NOTE — Anesthesia Postprocedure Evaluation (Signed)
Anesthesia Post Note  Patient: Bradley Alvarez  Procedure(s) Performed: Procedure(s) (LRB): TOTAL KNEE ARTHROPLASTY (Left)  Anesthesia type: general  Patient location: PACU  Post pain: Pain level controlled  Post assessment: Patient's Cardiovascular Status Stable  Last Vitals:  Filed Vitals:   12/21/12 0700  BP: 176/80  Pulse:   Temp:   Resp:     Post vital signs: Reviewed and stable  Level of consciousness: sedated  Complications: No apparent anesthesia complications

## 2012-12-21 NOTE — Progress Notes (Signed)
Inpatient Diabetes Program Recommendations  AACE/ADA: New Consensus Statement on Inpatient Glycemic Control (2013)  Target Ranges:  Prepandial:   less than 140 mg/dL      Peak postprandial:   less than 180 mg/dL (1-2 hours)      Critically ill patients:  140 - 180 mg/dL    Inpatient Diabetes Program Recommendations Insulin - Basal: start Lantus 15 units Correction (SSI): Increase to RESISTANT scale during steroid therapy A1C=7.9  Will follow. Thank you  Piedad Climes BSN, RN,CDE Inpatient Diabetes Coordinator [phone] (team pager)

## 2012-12-22 ENCOUNTER — Encounter (HOSPITAL_COMMUNITY): Payer: Self-pay | Admitting: Orthopedic Surgery

## 2012-12-22 DIAGNOSIS — R238 Other skin changes: Secondary | ICD-10-CM | POA: Diagnosis not present

## 2012-12-22 DIAGNOSIS — D62 Acute posthemorrhagic anemia: Secondary | ICD-10-CM | POA: Diagnosis not present

## 2012-12-22 DIAGNOSIS — E876 Hypokalemia: Secondary | ICD-10-CM | POA: Diagnosis not present

## 2012-12-22 LAB — CBC
MCH: 25.9 pg — ABNORMAL LOW (ref 26.0–34.0)
MCV: 79.1 fL (ref 78.0–100.0)
Platelets: 131 10*3/uL — ABNORMAL LOW (ref 150–400)
RDW: 15.1 % (ref 11.5–15.5)
WBC: 9.4 10*3/uL (ref 4.0–10.5)

## 2012-12-22 LAB — BASIC METABOLIC PANEL
Calcium: 8.8 mg/dL (ref 8.4–10.5)
Chloride: 102 mEq/L (ref 96–112)
Creatinine, Ser: 1.11 mg/dL (ref 0.50–1.35)
GFR calc Af Amer: 75 mL/min — ABNORMAL LOW (ref >90)
Sodium: 140 mEq/L (ref 135–145)

## 2012-12-22 LAB — GLUCOSE, CAPILLARY
Glucose-Capillary: 180 mg/dL — ABNORMAL HIGH (ref 70–99)
Glucose-Capillary: 188 mg/dL — ABNORMAL HIGH (ref 70–99)
Glucose-Capillary: 222 mg/dL — ABNORMAL HIGH (ref 70–99)
Glucose-Capillary: 245 mg/dL — ABNORMAL HIGH (ref 70–99)

## 2012-12-22 MED ORDER — POTASSIUM CHLORIDE CRYS ER 20 MEQ PO TBCR
40.0000 meq | EXTENDED_RELEASE_TABLET | Freq: Two times a day (BID) | ORAL | Status: DC
  Administered 2012-12-22 (×2): 40 meq via ORAL
  Administered 2012-12-23: 20 meq via ORAL
  Filled 2012-12-22: qty 1
  Filled 2012-12-22 (×4): qty 2

## 2012-12-22 NOTE — Progress Notes (Signed)
Physical Therapy Treatment Patient Details Name: Bradley Alvarez MRN: [id number] DOB: 22-Apr-1941 Today's Date: 12/22/2012 Time: 0912-0939 PT Time Calculation (min): 27 min  PT Assessment / Plan / Recommendation Comments on Treatment Session  Pt progressing with increased activity tolerance and no compliants of pain this session. Pt reported feeling much better today and ready to take a walk. Ambulated to nurses desk and back to room with one standing rest break. Mild SOB noted but subsided with return to seated postion.     Follow Up Recommendations  SNF     Does the patient have the potential to tolerate intense rehabilitation     Barriers to Discharge        Equipment Recommendations  Rolling walker with 5" wheels (bariatric)    Recommendations for Other Services    Frequency 7X/week   Plan      Precautions / Restrictions Precautions Precautions: Knee Required Braces or Orthoses: Knee Immobilizer - Left Knee Immobilizer - Left: On except when in CPM;Discontinue once straight leg raise with < 10 degree lag Restrictions LLE Weight Bearing: Weight bearing as tolerated   Pertinent Vitals/Pain no apparent distress     Mobility  Bed Mobility Supine to Sit: 4: Min assist Details for Bed Mobility Assistance: A to bring trunk upright and LLE to floor. Cues for technique. Transfers Sit to Stand: 4: Min assist;With upper extremity assist;From bed;From elevated surface Stand to Sit: To chair/3-in-1;With upper extremity assist;4: Min assist Details for Transfer Assistance: Min A for balance and stability when turning and backing for stand to sit transfer.  Ambulation/Gait Ambulation/Gait Assistance: 4: Min assist Ambulation Distance (Feet): 100 Feet Assistive device: Rolling walker Ambulation/Gait Assistance Details: Cues for sequence for backing up. Reliance on UE. Able to correct upright posture with min cueing. Gait Pattern: Trunk flexed;Decreased stance time - right;Decreased  step length - right;Decreased step length - left    Exercises Total Joint Exercises Quad Sets: AROM;Supine;10 reps;Both Short Arc Quad: AAROM;Left;10 reps;Supine Heel Slides: AAROM;Left;10 reps;Supine Hip ABduction/ADduction: AAROM;Left;Supine;10 reps Straight Leg Raises: AAROM;Left;10 reps;Supine   PT Diagnosis:    PT Problem List:   PT Treatment Interventions:     PT Goals Acute Rehab PT Goals PT Goal: Supine/Side to Sit - Progress: Progressing toward goal PT Goal: Sit to Supine/Side - Progress: Progressing toward goal PT Goal: Sit to Stand - Progress: Progressing toward goal PT Goal: Stand to Sit - Progress: Progressing toward goal PT Goal: Ambulate - Progress: Progressing toward goal PT Goal: Perform Home Exercise Program - Progress: Progressing toward goal  Visit Information  Last PT Received On: 12/22/12 Assistance Needed: +1    Subjective Data      Cognition  Overall Cognitive Status: Appears within functional limits for tasks assessed/performed Arousal/Alertness: Awake/alert Orientation Level: Appears intact for tasks assessed Behavior During Session: [facility] for tasks performed    Balance     End of Session PT - End of Session Equipment Utilized During Treatment: Gait belt Activity Tolerance: Patient tolerated treatment well Patient left: in chair;with call bell/phone within reach;with family/visitor present   GP     [name] 12/22/2012, 9:54 AM

## 2012-12-22 NOTE — Progress Notes (Signed)
Physical Therapy Treatment Patient Details Name: Bradley Alvarez MRN: [id number] DOB: [birth year] Today's Date: 12/22/2012 Time: 4098-1191 PT Time Calculation (min): 30 min  PT Assessment / Plan / Recommendation Comments on Treatment Session  Pt continues to progress with therapy. Continue with current POC.    Follow Up Recommendations  SNF     Does the patient have the potential to tolerate intense rehabilitation     Barriers to Discharge        Equipment Recommendations  Rolling walker with 5" wheels    Recommendations for Other Services    Frequency 7X/week   Plan Discharge plan remains appropriate;Frequency remains appropriate    Precautions / Restrictions Precautions Precautions: Knee Required Braces or Orthoses:  (PA discontinued) Restrictions LLE Weight Bearing: Weight bearing as tolerated   Pertinent Vitals/Pain no apparent distress     Mobility  Bed Mobility Sitting - Scoot to Edge of Bed: 4: Min guard Sit to Supine: 4: Min assist Details for Bed Mobility Assistance: Pt attempts to use hooking technique but still requires assistance to lift LE all the way onto bed.   Transfers Sit to Stand: 4: Min assist;From elevated surface;With upper extremity assist;From chair/3-in-1 Stand to Sit: To bed;4: Min assist Details for Transfer Assistance: Cueing for descent Ambulation/Gait Ambulation/Gait Assistance: 4: Min Environmental consultant (Feet): 100 Feet Assistive device: Rolling walker Gait Pattern: Trunk flexed;Decreased stance time - right;Decreased step length - right;Decreased step length - left;Antalgic General Gait Details: Antalgic gait due to discontinued use of immobilizer per PA.     Exercises Total Joint Exercises Ankle Circles/Pumps: AROM;Both;10 reps;Seated Quad Sets: AROM;Both;10 reps;Seated Towel Squeeze: AROM;Both;10 reps;Seated Short Arc Quad: Seated Hip ABduction/ADduction: AROM;Left;10 reps Long Arc Quad: AROM;Left;10 reps;Seated Knee  Flexion: AROM;Left;10 reps;Seated   PT Diagnosis:    PT Problem List:   PT Treatment Interventions:     PT Goals Acute Rehab PT Goals PT Goal: Sit to Supine/Side - Progress: Progressing toward goal PT Goal: Sit to Stand - Progress: Progressing toward goal PT Goal: Stand to Sit - Progress: Progressing toward goal PT Goal: Ambulate - Progress: Progressing toward goal PT Goal: Perform Home Exercise Program - Progress: Progressing toward goal  Visit Information  Last PT Received On: 12/22/12 Assistance Needed: +1    Subjective Data      Cognition  Overall Cognitive Status: Appears within functional limits for tasks assessed/performed Arousal/Alertness: Awake/alert Orientation Level: Appears intact for tasks assessed Behavior During Session: [facility] for tasks performed    Balance     End of Session PT - End of Session Equipment Utilized During Treatment: Gait belt Activity Tolerance: Patient tolerated treatment well Patient left: in bed;with call bell/phone within reach;with family/visitor present CPM Left Knee CPM Left Knee: On Left Knee Flexion (Degrees): 70  Left Knee Extension (Degrees): 0    GP     [name] 12/22/2012, 2:13 PM

## 2012-12-22 NOTE — Evaluation (Signed)
Occupational Therapy Evaluation Patient Details Name: Bradley Alvarez MRN: [id number] DOB: September 09, 1941 Today's Date: 12/22/2012 Time: 1433-1500 OT Time Calculation (min): 27 min  OT Assessment / Plan / Recommendation Clinical Impression  Pt demos decline in function with ADLs, balance, safety and activity tolerance following L knee surgery. Pt would beneft from skilled OT services to address these impairments to restore PLOF to return home safely    OT Assessment  Patient needs continued OT Services    Follow Up Recommendations  SNF    Barriers to Discharge Decreased caregiver support;Other (comment) (wife unable to provide adequate assist at this time)    Equipment Recommendations       Recommendations for Other Services    Frequency  Min 2X/week    Precautions / Restrictions Precautions Precautions: Knee Required Braces or Orthoses:  (PA discontinued) Restrictions Weight Bearing Restrictions: Yes LLE Weight Bearing: Weight bearing as tolerated   Pertinent Vitals/Pain     ADL  Grooming: Performed;Wash/dry face;Wash/dry hands;Min guard Where Assessed - Grooming: Supported standing Upper Body Bathing: Simulated;Supervision/safety;Set up Where Assessed - Upper Body Bathing: Unsupported sitting Lower Body Bathing: Performed;Moderate assistance Where Assessed - Lower Body Bathing: Unsupported sitting;Supported sit to stand Upper Body Dressing: Performed;Supervision/safety;Set up Where Assessed - Upper Body Dressing: Unsupported sitting Lower Body Dressing: Performed;Moderate assistance Where Assessed - Lower Body Dressing: Unsupported sitting;Supported sit to stand Toilet Transfer: Performed;Minimal assistance Toilet Transfer Method: Other (comment) (ambulating from RW level) Toilet Transfer Equipment: Raised toilet seat with arms (or 3-in-1 over toilet);Grab bars Toileting - Clothing Manipulation and Hygiene: Performed;Minimal assistance Where Assessed - Medical sales representative and Hygiene: Standing Equipment Used: Rolling walker;Sock aid;Gait belt;Long-handled shoe horn;Long-handled sponge;Reacher ADL Comments: Pt and wife provided with education and demo of ADL A/E and tub bench for home use    OT Diagnosis: Generalized weakness  OT Problem List: Decreased strength;Decreased activity tolerance;Decreased knowledge of use of DME or AE;Impaired balance (sitting and/or standing);Pain;Decreased knowledge of precautions OT Treatment Interventions: Self-care/ADL training;Therapeutic activities;Therapeutic exercise;Neuromuscular education;DME and/or AE instruction;Patient/family education;Balance training   OT Goals Acute Rehab OT Goals OT Goal Formulation: With patient/family Time For Goal Achievement: 12/29/12 Potential to Achieve Goals: Good ADL Goals Pt Will Perform Grooming: with set-up;with supervision;Standing at sink ADL Goal: Grooming - Progress: Goal set today Pt Will Perform Lower Body Bathing: with min assist;with adaptive equipment ADL Goal: Lower Body Bathing - Progress: Goal set today Pt Will Perform Lower Body Dressing: with min assist;with adaptive equipment ADL Goal: Lower Body Dressing - Progress: Goal set today Pt Will Transfer to Toilet: with supervision;with DME;Grab bars ADL Goal: Toilet Transfer - Progress: Goal set today Pt Will Perform Toileting - Clothing Manipulation: with supervision;Standing ADL Goal: Toileting - Clothing Manipulation - Progress: Goal set today Pt Will Perform Toileting - Hygiene: with supervision;Standing at 3-in-1/toilet;Sitting on 3-in-1 or toilet ADL Goal: Toileting - Hygiene - Progress: Goal set today Pt Will Perform Tub/Shower Transfer: Tub transfer;with DME;Grab bars ADL Goal: Tub/Shower Transfer - Progress: Goal set today  Visit Information  Last OT Received On: 12/22/12 Assistance Needed: +1    Subjective Data  Subjective: " I am supposed to go to [facility] for rehab tomorrow " Patient  Stated Goal: To return home after therapy at SNF   Prior Functioning     Home Living Lives With: Spouse Available Help at Discharge: Skilled Nursing Facility Home Adaptive Equipment: Shower chair with back Additional Comments: Pt plans to d/c to SNF for further therapy after acute d/c Prior Function Level  of Independence: Independent Able to Take Stairs?: Yes Driving: Yes Vocation: Retired Musician: No difficulties Dominant Hand: Left         Vision/Perception Vision - Assessment Eye Alignment: Within Chemical engineer Perception: Within Functional Limits   Cognition  Overall Cognitive Status: Appears within functional limits for tasks assessed/performed Arousal/Alertness: Awake/alert Orientation Level: Appears intact for tasks assessed Behavior During Session: [facility] for tasks performed    Extremity/Trunk Assessment Right Upper Extremity Assessment RUE ROM/Strength/Tone: [facility] for tasks assessed Left Upper Extremity Assessment LUE ROM/Strength/Tone: [facility] for tasks assessed     Mobility Bed Mobility Bed Mobility: Supine to Sit;Sitting - Scoot to Edge of Bed Supine to Sit: 4: Min assist Sitting - Scoot to [place] of Bed: 4: Min guard Sit to Supine: 4: Min assist Details for Bed Mobility Assistance: Pt attempts to use hooking technique but still requires assistance to lift LE all the way onto bed.   Transfers Transfers: Sit to Stand;Stand to Sit Sit to Stand: 4: Min assist;With armrests;Without upper extremity assist;From bed;From chair/3-in-1 Stand to Sit: 4: Min assist;Without upper extremity assist;With armrests;To chair/3-in-1;To bed Details for Transfer Assistance: Cueing for descent     Shoulder Instructions     Exercise    Balance Balance Balance Assessed: No   End of Session OT - End of Session Equipment Utilized During Treatment: Gait belt;Other (comment) (3 in 1, RW, ADL A/E, tub bench) CPM Left Knee CPM Left Knee: On Left  Knee Flexion (Degrees): 70  Left Knee Extension (Degrees): 0   GO     Galen Manila 12/22/2012, 3:11 PM

## 2012-12-22 NOTE — Progress Notes (Signed)
Seen and agreed 11/01/2013 Robinette, Julia Elizabeth PTA [phone] pager [phone] office    

## 2012-12-22 NOTE — Progress Notes (Signed)
Patient ID: KHYLEN RIOLO, male   DOB: [birth year], 72 y.o.   MRN: [id number] PATIENT ID: INOCENCIO ROY  MRN: [id number]  DOB/AGE:  06/19/1941 / 72 y.o.  72 Days Post-Op Procedure(s) (LRB): TOTAL KNEE ARTHROPLASTY (Left)    PROGRESS NOTE Subjective: Patient is alert, oriented, no Nausea, no Vomiting, yes passing gas, no Bowel Movement. Taking PO well. Denies SOB, Chest or Calf Pain. Using Incentive Spirometer, PAS in place. Ambulate better today, CPM 0-60 Patient reports pain as 4 on 0-10 scale  .    Objective: Vital signs in last 24 hours: Filed Vitals:   12/21/12 0700 12/21/12 1500 12/21/12 2131 12/22/12 1028  BP: 176/80 172/72 165/75 145/72  Pulse:  64 95   Temp:  98 F (36.7 C) 99 F (37.2 C)   TempSrc:   Oral   Resp:  20 20   SpO2:  98% 99%       Intake/Output from previous day: I/O last 3 completed shifts: In: 1200 [I.V.:1200] Out: 1985 [Urine:1975; Drains:10]   Intake/Output this shift:     LABORATORY DATA:  Basename 12/22/12 0718 12/22/12 0710 12/21/12 2119 12/21/12 1617 12/21/12 0536  WBC -- 9.4 -- -- 9.5  HGB -- 9.4* -- -- 10.5*  HCT -- 28.7* -- -- 32.0*  PLT -- 131* -- -- 147*  NA -- 140 -- -- 136  K -- 3.4* -- -- 3.4*  CL -- 102 -- -- 98  CO2 -- 28 -- -- 28  BUN -- 12 -- -- 14  CREATININE -- 1.11 -- -- 1.09  GLUCOSE -- 190* -- -- 225*  GLUCAP 180* -- 233* 351* --  INR -- -- -- -- --  CALCIUM -- 8.8 -- -- --    Examination: ABD soft Neurovascular intact Sensation intact distally Intact pulses distally Dorsiflexion/Plantar flexion intact Incision: scant drainage}    Assessment:   72 Days Post-Op Procedure(s) (LRB): TOTAL KNEE ARTHROPLASTY (Left) ADDITIONAL DIAGNOSIS:  Acute Blood Loss Anemia and Hypokalemia Principal Problem:  *Left knee DJD Active Problems:  Diabetes mellitus  Hypertension  Diabetes mellitus type 2, uncontrolled, with complications  Hypercholesteremia  Lichen planus  Morbid obesity with BMI of 45.0-49.9, adult  Sleep  apnea, obstructive  Benign prostate hyperplasia  Hypokalemia  Postoperative anemia due to acute blood loss  Skin bulla  Plan: PT/OT WBAT, CPM 5/hrs day until ROM 0-60 degrees, then D/C CPM DVT Prophylaxis:  SCDx72hrs,  DISCHARGE PLAN: Skilled Nursing Facility/Rehab DISCHARGE NEEDS: patient needs tight control of his CBG's   Will continue to aggressive treat his high sugars.  Plan on SNF tomorrow     Jacques Fife J 12/22/2012, 11:00 AM

## 2012-12-22 NOTE — Plan of Care (Signed)
Problem: Phase II Progression Outcomes Goal: Discharge plan established Pt plans to d/c to SNF for further therapy needs after acute care d/c

## 2012-12-23 DIAGNOSIS — R339 Retention of urine, unspecified: Secondary | ICD-10-CM

## 2012-12-23 LAB — CBC
HCT: 26.2 % — ABNORMAL LOW (ref 39.0–52.0)
MCHC: 33.2 g/dL (ref 30.0–36.0)
Platelets: 146 10*3/uL — ABNORMAL LOW (ref 150–400)
RDW: 14.7 % (ref 11.5–15.5)
WBC: 10.5 10*3/uL (ref 4.0–10.5)

## 2012-12-23 LAB — BASIC METABOLIC PANEL
Chloride: 98 mEq/L (ref 96–112)
GFR calc Af Amer: 65 mL/min — ABNORMAL LOW (ref >90)
GFR calc non Af Amer: 56 mL/min — ABNORMAL LOW (ref >90)
Potassium: 4.3 mEq/L (ref 3.5–5.1)
Sodium: 136 mEq/L (ref 135–145)

## 2012-12-23 LAB — GLUCOSE, CAPILLARY
Glucose-Capillary: 190 mg/dL — ABNORMAL HIGH (ref 70–99)
Glucose-Capillary: 147 mg/dL — ABNORMAL HIGH (ref 70–99)

## 2012-12-23 MED ORDER — FUROSEMIDE 40 MG PO TABS
40.0000 mg | ORAL_TABLET | Freq: Every day | ORAL | Status: DC
  Administered 2012-12-23: 40 mg via ORAL
  Filled 2012-12-23: qty 1

## 2012-12-23 MED ORDER — TRIAMTERENE-HCTZ 37.5-25 MG PO TABS
1.0000 | ORAL_TABLET | Freq: Every day | ORAL | Status: DC
  Administered 2012-12-23: 1 via ORAL
  Filled 2012-12-23: qty 1

## 2012-12-23 MED ORDER — OXYCODONE HCL 5 MG PO TABS: 5.0000 mg | ORAL_TABLET | ORAL | Status: DC | PRN

## 2012-12-23 MED ORDER — INSULIN ASPART 100 UNIT/ML ~~LOC~~ SOLN: 0.0000 [IU] | Freq: Three times a day (TID) | SUBCUTANEOUS | Status: DC

## 2012-12-23 MED ORDER — DSS 100 MG PO CAPS: ORAL_CAPSULE | ORAL | Status: DC

## 2012-12-23 MED ORDER — INSULIN ASPART 100 UNIT/ML ~~LOC~~ SOLN: 4.0000 [IU] | Freq: Three times a day (TID) | SUBCUTANEOUS | Status: DC

## 2012-12-23 MED ORDER — ASPIRIN EC 325 MG PO TBEC: DELAYED_RELEASE_TABLET | ORAL | Status: DC

## 2012-12-23 MED ORDER — FUROSEMIDE 40 MG PO TABS: 40.0000 mg | ORAL_TABLET | Freq: Every day | ORAL | Status: DC

## 2012-12-23 MED ORDER — INSULIN ASPART 100 UNIT/ML ~~LOC~~ SOLN: 0.0000 [IU] | Freq: Every day | SUBCUTANEOUS | Status: DC

## 2012-12-23 MED ORDER — INSULIN GLARGINE 100 UNIT/ML ~~LOC~~ SOLN: 15.0000 [IU] | Freq: Every day | SUBCUTANEOUS | Status: DC

## 2012-12-23 MED ORDER — BETHANECHOL CHLORIDE 25 MG PO TABS
25.0000 mg | ORAL_TABLET | Freq: Four times a day (QID) | ORAL | Status: DC
  Administered 2012-12-23: 25 mg via ORAL
  Filled 2012-12-23 (×4): qty 1

## 2012-12-23 MED ORDER — BISACODYL 10 MG RE SUPP
10.0000 mg | Freq: Once | RECTAL | Status: AC
  Administered 2012-12-23: 10 mg via RECTAL
  Filled 2012-12-23: qty 1

## 2012-12-23 MED ORDER — BISACODYL 5 MG PO TBEC: DELAYED_RELEASE_TABLET | ORAL | Status: DC

## 2012-12-23 MED ORDER — CELECOXIB 200 MG PO CAPS: 200.0000 mg | ORAL_CAPSULE | Freq: Every day | ORAL | Status: DC

## 2012-12-23 MED ORDER — TAMSULOSIN HCL 0.4 MG PO CAPS
0.4000 mg | ORAL_CAPSULE | Freq: Every day | ORAL | Status: DC
  Administered 2012-12-23: 0.4 mg via ORAL
  Filled 2012-12-23 (×2): qty 1

## 2012-12-23 MED ORDER — TAMSULOSIN HCL 0.4 MG PO CAPS: 0.4000 mg | ORAL_CAPSULE | Freq: Every day | ORAL | Status: DC

## 2012-12-23 NOTE — Progress Notes (Signed)
Physical Therapy Treatment Patient Details Name: Bradley Alvarez MRN: [id number] DOB: Aug 30, 1941 Today's Date: 12/23/2012 Time: 4098-1191 PT Time Calculation (min): 24 min  PT Assessment / Plan / Recommendation Comments on Treatment Session  Pt progressing this session with increased activity tolerance and no c/o of SOB. No increase in pain with exercises. Pt motivated and attempting to perform home exercise program. He is very excited about progressing and his ability to do SLR independently.     Follow Up Recommendations  SNF     Does the patient have the potential to tolerate intense rehabilitation     Barriers to Discharge        Equipment Recommendations  Rolling walker with 5" wheels (bariatric)    Recommendations for Other Services    Frequency 7X/week   Plan Discharge plan remains appropriate;Frequency remains appropriate    Precautions / Restrictions Precautions Precautions: Knee Precaution Comments: Reviewed proper  positioning and weight bearing status  Restrictions LLE Weight Bearing: Weight bearing as tolerated   Pertinent Vitals/Pain no apparent distress     Mobility  Bed Mobility Bed Mobility: Supine to Sit;Sitting - Scoot to Edge of Bed Supine to Sit: 4: Min guard Sitting - Scoot to [place] of Bed: 5: Supervision Details for Bed Mobility Assistance: Pt able to sit edge of bed on the R with no  physical assistance however still requires assistance with LLE to sit EOB on the left. Transfers Sit to Stand: 4: Min guard;With upper extremity assist;From bed Stand to Sit: 4: Min guard;With upper extremity assist;To chair/3-in-1 Details for Transfer Assistance: Pt now reaching back and attempting to control descent Ambulation/Gait Ambulation/Gait Assistance: 4: Min assist Ambulation Distance (Feet): 100 Feet Ambulation/Gait Assistance Details: Pt reported easier to amb this morning after doing exercises. Able to walk same distance without rest break. Min cueing for  upright posture.  Gait Pattern: Trunk flexed;Decreased stance time - right;Decreased step length - right;Decreased step length - left    Exercises Total Joint Exercises Ankle Circles/Pumps: AROM;Both;15 reps Towel Squeeze: AROM;Both;15 reps;Seated Short Arc Quad: AROM;Left;15 reps Straight Leg Raises: AROM;10 reps;Left;Supine Long Arc Quad: AROM;Left;15 reps;Seated Knee Flexion: AROM;Left;15 reps;Seated   PT Diagnosis:    PT Problem List:   PT Treatment Interventions:     PT Goals Acute Rehab PT Goals PT Goal: Supine/Side to Sit - Progress: Progressing toward goal PT Goal: Sit to Stand - Progress: Progressing toward goal PT Goal: Stand to Sit - Progress: Progressing toward goal PT Goal: Perform Home Exercise Program - Progress: Progressing toward goal  Visit Information  Assistance Needed: +1    Subjective Data      Cognition  Overall Cognitive Status: Appears within functional limits for tasks assessed/performed Arousal/Alertness: Awake/alert Orientation Level: Appears intact for tasks assessed Behavior During Session: [facility] for tasks performed    Balance     End of Session PT - End of Session Equipment Utilized During Treatment: Gait belt Activity Tolerance: Patient tolerated treatment well Patient left: in chair;with call bell/phone within reach;with family/visitor present;with nursing in room   GP     [name] 12/23/2012, 8:41 AM

## 2012-12-23 NOTE — Progress Notes (Signed)
Clinical Social Worker confirmed bed choice with family and [facility] SNF.  CSW submitted appropriate dc paperwork to SNF via [facility].   Angelia Mould, MSW, [place] [phone]

## 2012-12-23 NOTE — Discharge Summary (Signed)
Patient ID: Bradley Alvarez MRN: [id number] DOB/AGE: [birth year] 72 y.o.  Admit date: 12/20/2012 Discharge date: 12/23/2012  Admission Diagnoses:  Principal Problem:  *Left knee DJD Active Problems:  Diabetes mellitus  Hypertension  Diabetes mellitus type 2, uncontrolled, with complications  Hypercholesteremia  Lichen planus  Morbid obesity with BMI of 45.0-49.9, adult  Sleep apnea, obstructive  Benign prostate hyperplasia  Hypokalemia  Postoperative anemia due to acute blood loss  Skin bulla  Retention, urine   Discharge Diagnoses:  Same  Past Medical History  Diagnosis Date  . Hypertension   . Diabetes mellitus type 2, uncontrolled, with complications   . Hypercholesteremia   . Benign prostate hyperplasia   . Morbid obesity with BMI of 45.0-49.9, adult   . Lichen planus   . Left knee DJD   . Shortness of breath     WITH EXERTION   . Sleep apnea, obstructive     CPAP    Surgeries: Procedure(s): TOTAL KNEE ARTHROPLASTY on 12/20/2012   Consultants:  diabetic nurse for insulin management  Discharged Condition: Improved  Hospital Course: Bradley Alvarez is an 72 y.o. male who was admitted 12/20/2012 for operative treatment ofLeft knee DJD. Patient has severe unremitting pain that affects sleep, daily activities, and work/hobbies. After pre-op clearance the patient was taken to the operating room on 12/20/2012 and underwent  Procedure(s): TOTAL KNEE ARTHROPLASTY.    Patient was given perioperative antibiotics: Anti-infectives     Start     Dose/Rate Route Frequency Ordered Stop   12/20/12 1800   ceFAZolin (ANCEF) IVPB 2 g/50 mL premix        2 g 100 mL/hr over 30 Minutes Intravenous Every 6 hours 12/20/12 1647 2012/12/30 0023   12/20/12 1149   cefUROXime (ZINACEF) injection  Status:  Discontinued          As needed 12/20/12 1151 12/20/12 1341   12/20/12 0600   ceFAZolin (ANCEF) 3 g in dextrose 5 % 50 mL IVPB        3 g 160 mL/hr over 30 Minutes Intravenous On call to  O.R. 12/19/12 1443 12/20/12 1100           Patient was given sequential compression devices, early ambulation, and chemoprophylaxis to prevent DVT.  Patient benefited maximally from hospital stay and there were no complications.    Recent vital signs: Patient Vitals for the past 24 hrs:  BP Temp Temp src Pulse Resp SpO2 Height Weight  12/23/12 0519 149/76 mmHg 98.2 F (36.8 C) Oral 65  20  96 % - -  12/22/12 2233 - - - - - - 6\' 2"  (1.88 m) 160.12 kg (353 lb)  12/22/12 2147 161/73 mmHg 98.1 F (36.7 C) Oral 94  20  99 % - -  12/22/12 2000 - - - - 20  100 % - -  12/22/12 1433 132/66 mmHg 98 F (36.7 C) - 72  20  100 % - -  12/22/12 1028 145/72 mmHg - - - - - - -     Recent laboratory studies:  Basename 12/22/12 0710 December 30, 2012 0536  WBC 9.4 9.5  HGB 9.4* 10.5*  HCT 28.7* 32.0*  PLT 131* 147*  NA 140 136  K 3.4* 3.4*  CL 102 98  CO2 28 28  BUN 12 14  CREATININE 1.11 1.09  GLUCOSE 190* 225*  INR -- --  CALCIUM 8.8 --     Discharge Medications:     Medication List     As of  12/23/2012  8:07 AM    STOP taking these medications         HYDROcodone-acetaminophen 5-325 MG per tablet   Commonly known as: NORCO/VICODIN      Krill Oil 300 MG Caps      methocarbamol 500 MG tablet   Commonly known as: ROBAXIN      vitamin B-12 500 MCG tablet   Commonly known as: CYANOCOBALAMIN      TAKE these medications         aspirin EC 325 MG tablet   1 tablet by mouth daily for DVT prophylaxis.  Patient had too much bleeding on Lovenox      bisacodyl 5 MG EC tablet   Commonly known as: DULCOLAX   Take 2 tablets every night with dinner until bowel movement.  LAXITIVE.  Restart if two days since last bowel movement      carvedilol 25 MG tablet   Commonly known as: COREG   Take 25 mg by mouth 2 (two) times daily with a meal.      celecoxib 200 MG capsule   Commonly known as: CELEBREX   Take 1 capsule (200 mg total) by mouth daily.      clobetasol cream 0.05 %   Commonly  known as: TEMOVATE   Apply 1 application topically 2 (two) times daily.      [employer] SPECTRAVITE SENIOR PO   Take 1 tablet by mouth daily.      DSS 100 MG Caps   1 tab 2 times a day while on narcotics.  STOOL SOFTENER      exenatide 10 MCG/0.04ML Soln   Commonly known as: BYETTA   Inject 10 mcg into the skin 2 (two) times daily with a meal.      felodipine 5 MG 24 hr tablet   Commonly known as: PLENDIL   Take 10 mg by mouth daily.      furosemide 40 MG tablet   Commonly known as: LASIX   Take 40 mg by mouth daily.      glimepiride 4 MG tablet   Commonly known as: AMARYL   Take 4 mg by mouth daily before breakfast.      insulin aspart 100 UNIT/ML injection   Commonly known as: novoLOG   Inject 0-15 Units into the skin 3 (three) times daily with meals.      insulin aspart 100 UNIT/ML injection   Commonly known as: novoLOG   Inject 0-5 Units into the skin at bedtime.      insulin aspart 100 UNIT/ML injection   Commonly known as: novoLOG   Inject 4 Units into the skin 3 (three) times daily with meals.      insulin glargine 100 UNIT/ML injection   Commonly known as: LANTUS   Inject 15 Units into the skin at bedtime.      lisinopril 40 MG tablet   Commonly known as: PRINIVIL,ZESTRIL   Take 40 mg by mouth 2 (two) times daily.      metFORMIN 1000 MG tablet   Commonly known as: GLUCOPHAGE   Take 1,000 mg by mouth 2 (two) times daily with a meal.      oxyCODONE 5 MG immediate release tablet   Commonly known as: Oxy IR/ROXICODONE   Take 1-2 tablets (5-10 mg total) by mouth every 3 (three) hours as needed.      potassium chloride SA 20 MEQ tablet   Commonly known as: K-DUR,KLOR-CON   Take 20 mEq by mouth daily.  simvastatin 40 MG tablet   Commonly known as: ZOCOR   Take 40 mg by mouth every evening.      Tamsulosin HCl 0.4 MG Caps   Commonly known as: FLOMAX   Take 1 capsule (0.4 mg total) by mouth daily after breakfast.      terbinafine 1 % cream   Commonly  known as: LAMISIL   Apply 1 application topically 3 (three) times daily.      triamterene-hydrochlorothiazide 37.5-25 MG per tablet   Commonly known as: MAXZIDE-25   Take 1 tablet by mouth daily.        Diagnostic Studies: Dg Chest 2 View  12/16/2012  *RADIOLOGY REPORT*  Clinical Data: Preop for total knee arthroplasty.  CHEST - 2 VIEW  Comparison: One-view chest 08/30/1009.  CT of the chest 08/29/2009.  Findings: The heart size is normal.  No stone is displaced the left secondary to chronic elevation of the right hemidiaphragm.  The lungs are clear.  The visualized soft tissues and bony thorax are unremarkable.  IMPRESSION:  1.  Stable elevation of the right hemidiaphragm. 2.  No acute cardiopulmonary disease.   Original Report Authenticated By: Marin Rockwell, M.D.     Disposition:       Discharge Orders    Future Orders Please Complete By Expires   Diet - low sodium heart healthy      Call MD / Call 911      Comments:   If you experience chest pain or shortness of breath, CALL 911 and be transported to the hospital emergency room.  If you develope a fever above 101 F, pus (white drainage) or increased drainage or redness at the wound, or calf pain, call your surgeon's office.   Discharge instructions      Comments:   Total Knee Replacement Care After Refer to this sheet in the next few weeks. These discharge instructions provide you with general information on caring for yourself after you leave the hospital. Your caregiver may also give you specific instructions. Your treatment has been planned according to the most current medical practices available, but unavoidable complications sometimes occur. If you have any problems or questions after discharge, please call your caregiver. Regaining a near full range of motion of your knee within the first 3 to 6 weeks after surgery is critical. HOME CARE INSTRUCTIONS  You may resume a normal diet and activities as directed.  Perform  exercises as directed.  Place yellow foam block, yellow side up under heel at all times except when in CPM or when walking.  DO NOT modify, tear, cut, or change in any way. You will receive physical therapy daily  Take showers instead of baths until informed otherwise.  Change bandages (dressings)daily Do not take over-the-counter or prescription medicines for pain, discomfort, or fever. Eat a well-balanced diet.  Avoid lifting or driving until you are instructed otherwise.  Make an appointment to see your caregiver for stitches (suture) or staple removal as directed.  If you have been sent home with a continuous passive motion machine (CPM machine), 0-90 degrees 6 hrs a day   2 hrs a shift SEEK MEDICAL CARE IF: You have swelling of your calf or leg.  You develop shortness of breath or chest pain.  You have redness, swelling, or increasing pain in the wound.  There is pus or any unusual drainage coming from the surgical site.  You notice a bad smell coming from the surgical site or dressing.  The surgical  site breaks open after sutures or staples have been removed.  There is persistent bleeding from the suture or staple line.  You are getting worse or are not improving.  You have any other questions or concerns.  SEEK IMMEDIATE MEDICAL CARE IF:  You have a fever.  You develop a rash.  You have difficulty breathing.  You develop any reaction or side effects to medicines given.  Your knee motion is decreasing rather than improving.  MAKE SURE YOU:  Understand these instructions.  Will watch your condition.  Will get help right away if you are not doing well or get worse.   Constipation Prevention      Comments:   Drink plenty of fluids.  Prune juice may be helpful.  You may use a stool softener, such as Colace (over the counter) 100 mg twice a day.  Use MiraLax (over the counter) for constipation as needed.   Increase activity slowly as tolerated      CPM      Comments:   Continuous  passive motion machine (CPM):      Use the CPM from 0 to 90 for 6 hours per day.       You may break it up into 2 or 3 sessions per day.      Use CPM for 2 weeks or until you are told to stop.   TED hose      Comments:   Use stockings (TED hose) for 2 weeks on both leg(s).  You may remove them at night for sleeping.   Change dressing      Comments:   Change the dressing daily with sterile 4 x 4 inch gauze dressing and apply TED hose.  You may clean the incision with alcohol prior to redressing.   Do not put a pillow under the knee. Place it under the heel.      Comments:   Place yellow foam block, yellow side up under heel at all times except when in CPM or when walking.  DO NOT modify, tear, cut, or change in any way the yellow foam block.      Follow-up Information    Follow up with Nilda Simmer, MD. On 01/03/2013. (appt time 3 pm)    Contact information:   [address] [place] [address] [phone]           Signed: Pascal Lux 12/23/2012, 8:07 AM

## 2012-12-23 NOTE — Progress Notes (Signed)
12/23/2012 Fredrich Birks PTA [phone] pager [phone] office

## 2012-12-23 NOTE — Progress Notes (Signed)
Inpatient Diabetes Program Recommendations  AACE/ADA: New Consensus Statement on Inpatient Glycemic Control (2013)  Target Ranges:  Prepandial:   less than 140 mg/dL      Peak postprandial:   less than 180 mg/dL (1-2 hours)      Critically ill patients:  140 - 180 mg/dL    Inpatient Diabetes Program Recommendations Insulin - Basal: Increase Lantus to 20 units  Correction (SSI): . Insulin - Meal Coverage: agree with new order Thank you  Piedad Climes BSN, RN,CDE Inpatient Diabetes Coordinator [phone] (team pager)

## 2013-05-04 ENCOUNTER — Encounter: Payer: Self-pay | Admitting: Cardiovascular Disease

## 2013-06-12 ENCOUNTER — Encounter: Payer: Self-pay | Admitting: *Deleted

## 2013-06-13 ENCOUNTER — Ambulatory Visit (INDEPENDENT_AMBULATORY_CARE_PROVIDER_SITE_OTHER): Payer: 59 | Admitting: Cardiovascular Disease

## 2013-06-13 ENCOUNTER — Encounter: Payer: Self-pay | Admitting: Cardiovascular Disease

## 2013-06-13 VITALS — BP 144/90 | Ht 74.0 in | Wt 336.6 lb

## 2013-06-13 DIAGNOSIS — R609 Edema, unspecified: Secondary | ICD-10-CM

## 2013-06-13 DIAGNOSIS — E1165 Type 2 diabetes mellitus with hyperglycemia: Secondary | ICD-10-CM

## 2013-06-13 DIAGNOSIS — E119 Type 2 diabetes mellitus without complications: Secondary | ICD-10-CM

## 2013-06-13 DIAGNOSIS — IMO0002 Reserved for concepts with insufficient information to code with codable children: Secondary | ICD-10-CM

## 2013-06-13 DIAGNOSIS — E7800 Pure hypercholesterolemia, unspecified: Secondary | ICD-10-CM

## 2013-06-13 DIAGNOSIS — R600 Localized edema: Secondary | ICD-10-CM

## 2013-06-13 DIAGNOSIS — Z6841 Body Mass Index (BMI) 40.0 and over, adult: Secondary | ICD-10-CM

## 2013-06-13 DIAGNOSIS — I10 Essential (primary) hypertension: Secondary | ICD-10-CM

## 2013-06-13 DIAGNOSIS — G4733 Obstructive sleep apnea (adult) (pediatric): Secondary | ICD-10-CM

## 2013-06-13 MED ORDER — FUROSEMIDE 40 MG PO TABS: ORAL_TABLET | ORAL | Status: DC

## 2013-06-13 NOTE — Progress Notes (Signed)
Patient ID: PRINCE COUEY, male   DOB: [birth year], 72 y.o.   MRN: [id number]     HPI: Bradley Alvarez, is a 72 y.o. male presents to the office for six-month cardiology evaluation.  He is now 72 years old and has a history of hypertension, type 2 diabetes mellitus, mixed hyperlipidemia, severe sleep apnea with CPAP therapy, morbid obesity, documented grade 1 diastolic dysfunction, and when I last saw him he was evaluated preoperatively prior to undergoing left total knee replacement surgery. He was given clearance to undergo the surgery and apparently had this done on his left knee on 12/20/2012 without cardiovascular complications. He did require antibiotics postoperatively. He admits to losing some weight. He does note swelling of his left leg intermittently. He denies recent chest pain. He denies palpitations. He denies presyncope or syncope.  Past Medical History  Diagnosis Date  . Diabetes mellitus type 2, uncontrolled, with complications   . Hypercholesteremia   . Benign prostate hyperplasia   . Morbid obesity with BMI of 45.0-49.9, adult   . Lichen planus   . Left knee DJD   . Shortness of breath     WITH EXERTION   . Sleep apnea, obstructive 10/14/08    PSA- [facility] AHI duuring total sleep was 61.8/hr and during REMsleep 0.0/hr RDI during total sleep was 96.1/hr. and during REM sleep at 0.0/hr. The average O2 sat range during REM and NREM was 94% The lowest O2 Sat during REM and NREM was 85%  Severe OSA /hypopnea syndrome. Notably events were worse in the supine position and during REM sleep.   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 Aortic valve sclerosis   . Hypertension 10/02/11    lexiscan myoview negative for ischemia EF 46%low risk scan.  ECHO 12/07/12 No change in previous echo.    Past Surgical History  Procedure Laterality Date  . 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 Suprapubic prostatectomy    . Knee  arthroscopy      RT KNEE   . Total knee arthroplasty  12/20/2012    Procedure: TOTAL KNEE ARTHROPLASTY;  Surgeon: Nilda Simmer, MD;  Location: [facility] OR;  Service: Orthopedics;  Laterality: Left;  left total knee arthroplasty  . Cardiac catheterization      No Known Allergies  Current Outpatient Prescriptions  Medication Sig Dispense Refill  . aspirin 81 MG tablet Take 81 mg by mouth daily.      . carvedilol (COREG) 25 MG tablet Take 25 mg by mouth 2 (two) times daily with a meal.      . docusate sodium 100 MG CAPS 1 tab 2 times a day while on narcotics.  STOOL SOFTENER  60 capsule  0  . felodipine (PLENDIL) 5 MG 24 hr tablet Take 10 mg by mouth daily.      . furosemide (LASIX) 40 MG tablet TAKE 1 TABLET TWICE DAILY.  60 tablet  6  . lisinopril (PRINIVIL,ZESTRIL) 40 MG tablet Take 40 mg by mouth 2 (two) times daily.       . metFORMIN (GLUCOPHAGE) 1000 MG tablet Take 1,000 mg by mouth 2 (two) times daily with a meal.      . Multiple Vitamins-Minerals ([employer]S SPECTRAVITE SENIOR PO) Take 1 tablet by mouth daily.       Marland Kitchen oxyCODONE (OXY IR/ROXICODONE) 5 MG immediate release tablet Take 1-2 tablets (5-10 mg total) by mouth every 3 (  three) hours as needed.  100 tablet  0  . potassium chloride SA (K-DUR,KLOR-CON) 20 MEQ tablet Take 20 mEq by mouth daily.       . simvastatin (ZOCOR) 40 MG tablet Take 40 mg by mouth every evening.      . Tamsulosin HCl (FLOMAX) 0.4 MG CAPS Take 1 capsule (0.4 mg total) by mouth daily after breakfast.  30 capsule  3  . triamterene-hydrochlorothiazide (MAXZIDE-25) 37.5-25 MG per tablet Take 1 tablet by mouth daily.       No current facility-administered medications for this visit.    Socially he is married and has 5 children. He is not able to exercise routinely. He has remote tobacco history. There is no alcohol. He states he did lose more weight but has regained some weight back.  ROS is negative for fevers, chills or night sweats. He denies wheezing. He admits to  100% compliance with his CPAP. He cannot sleep without it. He denies residual daytime sleepiness. He is unaware of breakthrough snoring. He denies chest pressure. He denies palpitations. He does denies any bleeding. He does note some mild swelling of his left leg more than his right. He denies paresthesias.   Other system review is negative.  PE BP 144/90  Ht 6\' 2"  (1.88 m)  Wt 336 lb 9.6 oz (152.681 kg)  BMI 43.2 kg/m2  General: Alert, oriented, no distress. Morbidly obese. Skin: normal turgor, no rashes HEENT: Normocephalic, atraumatic. Pupils round and reactive; sclera anicteric;no lid lag.  Nose without nasal septal hypertrophy Mouth/Parynx benign; Mallinpatti scale 4 Neck: Very thick neck; No JVD, no carotid briuts Lungs: clear to ausculatation and percussion; no wheezing or rales Heart: RRR, s1 s2 normal 1-2/6 systolic murmur. Abdomen: Moderate central adiposity soft, nontender; no hepatosplenomehaly, BS+; abdominal aorta nontender and not dilated by palpation. Pulses 2+ Extremities: 1+ left leg/ankle swelling. Trace edema right ankle; no clubbing cyanosis, Homan's sign negative  Neurologic: grossly nonfocal  ECG: Sinus rhythm at 55 beats per minute. PR interval 202 ms. QTC interval 443 ms.  LABS:  BMET    Component Value Date/Time   NA 136 12/23/2012 0710   K 4.3 12/23/2012 0710   CL 98 12/23/2012 0710   CO2 24 12/23/2012 0710   GLUCOSE 198* 12/23/2012 0710   BUN 19 12/23/2012 0710   CREATININE 1.25 12/23/2012 0710   CALCIUM 9.2 12/23/2012 0710   GFRNONAA 56* 12/23/2012 0710   GFRAA 65* 12/23/2012 0710     Hepatic Function Panel     Component Value Date/Time   PROT 7.9 12/16/2012 1356   ALBUMIN 4.2 12/16/2012 1356   AST 29 12/16/2012 1356   ALT 27 12/16/2012 1356   ALKPHOS 43 12/16/2012 1356   BILITOT 0.3 12/16/2012 1356     CBC    Component Value Date/Time   WBC 10.5 12/23/2012 0710   RBC 3.32* 12/23/2012 0710   HGB 8.7* 12/23/2012 0710   HCT 26.2* 12/23/2012 0710   PLT  146* 12/23/2012 0710   MCV 78.9 12/23/2012 0710   MCH 26.2 12/23/2012 0710   MCHC 33.2 12/23/2012 0710   RDW 14.7 12/23/2012 0710   LYMPHSABS 3.1 12/16/2012 1356   MONOABS 0.9 12/16/2012 1356   EOSABS 0.2 12/16/2012 1356   BASOSABS 0.0 12/16/2012 1356     BNP No results found for this basename: probnp    Lipid Panel  No results found for this basename: chol, trig, hdl, cholhdl, vldl, ldlcalc     RADIOLOGY: No results found.  ASSESSMENT AND PLAN: Mr. Kortz has tolerated his knee surgery without cardiovascular compromise. He does have leg swelling today and I recommending further titration of his Lasix to 40 mg in the morning and 20 mg in the afternoon as long as he continues to have leg swelling. This should also further improve his blood pressure. He has lost 16 pounds of weight since his last office visit and I commended him on this. He sees Dr. Timothy Lasso for his primary care. I will review laboratory when available. His EKG is unchanged. I will see him in 6 month followup evaluation.     Lennette Bihari, MD, [facility], [place]  06/13/2013 5:32 PM

## 2013-06-13 NOTE — Patient Instructions (Addendum)
Your physician recommends that you schedule a follow-up appointment in: 6 MONTHS.  Your physician has recommended you make the following change in your medication: Take your furosemide as directed per Dr. Tresa Endo.

## 2013-08-09 ENCOUNTER — Telehealth (HOSPITAL_COMMUNITY): Payer: Self-pay | Admitting: *Deleted

## 2013-08-10 ENCOUNTER — Other Ambulatory Visit (HOSPITAL_COMMUNITY): Payer: Self-pay | Admitting: Orthopedic Surgery

## 2013-08-10 DIAGNOSIS — I872 Venous insufficiency (chronic) (peripheral): Secondary | ICD-10-CM

## 2013-08-11 ENCOUNTER — Ambulatory Visit (HOSPITAL_COMMUNITY)
Admission: RE | Admit: 2013-08-11 | Discharge: 2013-08-11 | Disposition: A | Discharge status: Home / Self Care | Payer: 59 | Source: Ambulatory Visit | Attending: Cardiovascular Disease | Admitting: Cardiovascular Disease

## 2013-08-11 DIAGNOSIS — M7989 Other specified soft tissue disorders: Secondary | ICD-10-CM

## 2013-08-11 DIAGNOSIS — I872 Venous insufficiency (chronic) (peripheral): Secondary | ICD-10-CM | POA: Insufficient documentation

## 2013-08-11 DIAGNOSIS — M79609 Pain in unspecified limb: Secondary | ICD-10-CM | POA: Insufficient documentation

## 2013-08-11 NOTE — Progress Notes (Signed)
Left Lower Ext. Arterial Duplex Completed.  Nisha Dhami, BS, RDMS, RVT  

## 2013-08-15 ENCOUNTER — Ambulatory Visit (HOSPITAL_COMMUNITY)
Admission: RE | Admit: 2013-08-15 | Discharge: 2013-08-15 | Disposition: A | Discharge status: Home / Self Care | Payer: 59 | Source: Ambulatory Visit | Attending: Cardiovascular Disease | Admitting: Cardiovascular Disease

## 2013-08-15 DIAGNOSIS — I872 Venous insufficiency (chronic) (peripheral): Secondary | ICD-10-CM

## 2013-08-15 DIAGNOSIS — M7989 Other specified soft tissue disorders: Secondary | ICD-10-CM

## 2013-08-15 NOTE — Progress Notes (Signed)
Left Lower Ext. Venous Completed. Marilynne Halsted, BS, RDMS, RVT

## 2013-10-29 ENCOUNTER — Other Ambulatory Visit: Payer: Self-pay | Admitting: Cardiovascular Disease

## 2013-10-31 NOTE — Telephone Encounter (Signed)
Rx was sent to pharmacy electronically. 

## 2013-12-07 ENCOUNTER — Encounter: Payer: Self-pay | Admitting: Podiatry

## 2013-12-07 ENCOUNTER — Ambulatory Visit (INDEPENDENT_AMBULATORY_CARE_PROVIDER_SITE_OTHER): Payer: 59 | Admitting: Podiatry

## 2013-12-07 VITALS — BP 181/97 | HR 68 | Resp 16

## 2013-12-07 DIAGNOSIS — B351 Tinea unguium: Secondary | ICD-10-CM

## 2013-12-07 DIAGNOSIS — M79609 Pain in unspecified limb: Secondary | ICD-10-CM

## 2013-12-07 NOTE — Patient Instructions (Signed)
Diabetes and Foot Care Diabetes may cause you to have problems because of poor blood supply (circulation) to your feet and legs. This may cause the skin on your feet to become thinner, break easier, and heal more slowly. Your skin may become dry, and the skin may peel and crack. You may also have nerve damage in your legs and feet causing decreased feeling in them. You may not notice minor injuries to your feet that could lead to infections or more serious problems. Taking care of your feet is one of the most important things you can do for yourself.  HOME CARE INSTRUCTIONS  Wear shoes at all times, even in the house. Do not go barefoot. Bare feet are easily injured.  Check your feet daily for blisters, cuts, and redness. If you cannot see the bottom of your feet, use a mirror or ask someone for help.  Wash your feet with warm water (do not use hot water) and mild soap. Then pat your feet and the areas between your toes until they are completely dry. Do not soak your feet as this can dry your skin.  Apply a moisturizing lotion or petroleum jelly (that does not contain alcohol and is unscented) to the skin on your feet and to dry, brittle toenails. Do not apply lotion between your toes.  Trim your toenails straight across. Do not dig under them or around the cuticle. File the edges of your nails with an emery board or nail file.  Do not cut corns or calluses or try to remove them with medicine.  Wear clean socks or stockings every day. Make sure they are not too tight. Do not wear knee-high stockings since they may decrease blood flow to your legs.  Wear shoes that fit properly and have enough cushioning. To break in new shoes, wear them for just a few hours a day. This prevents you from injuring your feet. Always look in your shoes before you put them on to be sure there are no objects inside.  Do not cross your legs. This may decrease the blood flow to your feet.  If you find a minor scrape,  cut, or break in the skin on your feet, keep it and the skin around it clean and dry. These areas may be cleansed with mild soap and water. Do not cleanse the area with peroxide, alcohol, or iodine.  When you remove an adhesive bandage, be sure not to damage the skin around it.  If you have a wound, look at it several times a day to make sure it is healing.  Do not use heating pads or hot water bottles. They may burn your skin. If you have lost feeling in your feet or legs, you may not know it is happening until it is too late.  Make sure your health care provider performs a complete foot exam at least annually or more often if you have foot problems. Report any cuts, sores, or bruises to your health care provider immediately. SEEK MEDICAL CARE IF:   You have an injury that is not healing.  You have cuts or breaks in the skin.  You have an ingrown nail.  You notice redness on your legs or feet.  You feel burning or tingling in your legs or feet.  You have pain or cramps in your legs and feet.  Your legs or feet are numb.  Your feet always feel cold. SEEK IMMEDIATE MEDICAL CARE IF:   There is increasing redness,   swelling, or pain in or around a wound.  There is a red line that goes up your leg.  Pus is coming from a wound.  You develop a fever or as directed by your health care provider.  You notice a bad smell coming from an ulcer or wound. Document Released: 11/14/2000 Document Revised: 07/20/2013 Document Reviewed: 04/26/2013 [employer] Patient Information 2014 [employer].  

## 2013-12-08 NOTE — Progress Notes (Signed)
Subjective:     Patient ID: Bradley Alvarez, male   DOB: [birth year], 73 y.o.   MRN: [id number]  HPI patient presents with a long  thick nails 1-5 both feet that are painful when pressed   Review of Systems     Objective:   Physical Exam Neurovascular status and health history unchanged with thick painful nail bed 1-5 both feet    Assessment:     Mycotic nail infection with pain 1-5 both feet    Plan:     Debridement of painful nailbeds 1-5 both feet with no bleed note

## 2013-12-13 ENCOUNTER — Ambulatory Visit: Payer: 59 | Admitting: Cardiovascular Disease

## 2013-12-26 ENCOUNTER — Ambulatory Visit (INDEPENDENT_AMBULATORY_CARE_PROVIDER_SITE_OTHER): Payer: 59 | Admitting: Cardiovascular Disease

## 2013-12-26 ENCOUNTER — Encounter: Payer: Self-pay | Admitting: Cardiovascular Disease

## 2013-12-26 VITALS — BP 130/84 | HR 75 | Ht 74.0 in | Wt 345.4 lb

## 2013-12-26 DIAGNOSIS — E119 Type 2 diabetes mellitus without complications: Secondary | ICD-10-CM

## 2013-12-26 DIAGNOSIS — R600 Localized edema: Secondary | ICD-10-CM

## 2013-12-26 DIAGNOSIS — Z6841 Body Mass Index (BMI) 40.0 and over, adult: Secondary | ICD-10-CM

## 2013-12-26 DIAGNOSIS — I10 Essential (primary) hypertension: Secondary | ICD-10-CM

## 2013-12-26 DIAGNOSIS — E7800 Pure hypercholesterolemia, unspecified: Secondary | ICD-10-CM

## 2013-12-26 DIAGNOSIS — R609 Edema, unspecified: Secondary | ICD-10-CM

## 2013-12-26 DIAGNOSIS — G4733 Obstructive sleep apnea (adult) (pediatric): Secondary | ICD-10-CM

## 2013-12-26 NOTE — Progress Notes (Signed)
Patient ID: DAEMON TORUNO, male   DOB: January 04, 1941, 73 y.o.   MRN: [id number]      HPI: MISHAWN BARTKIEWICZ, is a 73 y.o. male presents to the office for six-month cardiology evaluation.  Mr. Kesten is a19 years old AAM with a history of hypertension, type 2 diabetes mellitus, mixed hyperlipidemia, severe sleep apnea with CPAP therapy, morbid obesity, documented grade 1 diastolic dysfunction, and when I last saw him he was evaluated preoperatively prior to undergoing left total knee replacement surgery. He was given clearance to undergo the surgery and apparently had this done on his left knee on 12/20/2012 without cardiovascular complications. He did require antibiotics postoperatively.   He tells me over the past month he developed increasing issues with shortness of breath and leg swelling. He has documented grade 1 diastolic dysfunction. He did see Dr. Virgina Jock who altered his furosemide for several days to 80 mg in the morning which resulted in a 6 pound weight loss of volume overload. He states he has felt well. He doesn't be admitted to eating excessively over the holidays and had gained approximately 18 pounds over a three-week period. He now has lost 6 pounds since he saw Dr. Virgina Jock. He presents for followup cardiologic evaluation.  Upon questioning he cannot recall the last time he weighed less than 300 pounds. He denies recent chest pain with walking. He did note some fleeting episodes of vague chest discomfort however over the past several months which he felt was not heart related. He has been using his CPAP with 100% compliance including if he has to take a nap. He is unaware of breakthrough snoring. He denies restless legs   Past Medical History  Diagnosis Date  . Diabetes mellitus type 2, uncontrolled, with complications   . Hypercholesteremia   . Benign prostate hyperplasia   . Morbid obesity with BMI of 45.0-49.9, adult   . Lichen planus   . Left knee DJD   . Shortness of breath    WITH EXERTION   . Sleep apnea, obstructive 10/14/08    PSA- [facility] AHI duuring total sleep was 61.8/hr and during REMsleep 0.0/hr RDI during total sleep was 96.1/hr. and during REM sleep at 0.0/hr. The average O2 sat range during REM and NREM was 94% The lowest O2 Sat during REM and NREM was 85%  Severe OSA /hypopnea syndrome. Notably events were worse in the supine position and during REM sleep.   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 Aortic valve sclerosis   . Hypertension 10/02/11    lexiscan myoview negative for ischemia EF 46%low risk scan.  ECHO 12/07/12 No change in previous echo.    Past Surgical History  Procedure Laterality Date  . 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 Suprapubic prostatectomy    . Knee arthroscopy      RT KNEE   . Total knee arthroplasty  12/20/2012    Procedure: TOTAL KNEE ARTHROPLASTY;  Surgeon: Lorn Junes, MD;  Location: [facility];  Service: Orthopedics;  Laterality: Left;  left total knee arthroplasty  . Cardiac catheterization      No Known Allergies  Current Outpatient Prescriptions  Medication Sig Dispense Refill  . aspirin 81 MG tablet Take 81 mg by mouth daily.      Marland Kitchen BYETTA 10 MCG PEN 10 MCG/0.04ML SOPN injection Inject 10 mcg into the skin 2 (two) times daily.      Marland Kitchen  carvedilol (COREG) 25 MG tablet TAKE 1 TABLET TWICE A DAY  180 tablet  1  . clobetasol ointment (TEMOVATE) 9.38 % Apply 1 application topically once a week.      . docusate sodium 100 MG CAPS 1 tab 2 times a day while on narcotics.  STOOL SOFTENER  60 capsule  0  . felodipine (PLENDIL) 5 MG 24 hr tablet Take 10 mg by mouth daily.      . furosemide (LASIX) 40 MG tablet 40 mg. 60 mg in the morning      . HYDROcodone-acetaminophen (NORCO/VICODIN) 5-325 MG per tablet Take 1 tablet by mouth as needed.      Marland Kitchen lisinopril (PRINIVIL,ZESTRIL) 40 MG tablet Take 40 mg by mouth 2 (two) times daily.       . metFORMIN (GLUCOPHAGE)  1000 MG tablet Take 1,000 mg by mouth 2 (two) times daily with a meal.      . Multiple Vitamins-Minerals ([employer] SPECTRAVITE SENIOR PO) Take 1 tablet by mouth daily.       Marland Kitchen oxyCODONE (OXY IR/ROXICODONE) 5 MG immediate release tablet Take 1-2 tablets (5-10 mg total) by mouth every 3 (three) hours as needed.  100 tablet  0  . potassium chloride SA (K-DUR,KLOR-CON) 20 MEQ tablet Take 20 mEq by mouth daily.       . simvastatin (ZOCOR) 40 MG tablet Take 40 mg by mouth every evening.      . Tamsulosin HCl (FLOMAX) 0.4 MG CAPS Take 1 capsule (0.4 mg total) by mouth daily after breakfast.  30 capsule  3  . triamterene-hydrochlorothiazide (MAXZIDE-25) 37.5-25 MG per tablet Take 1 tablet by mouth daily.       No current facility-administered medications for this visit.    Socially he is married and has 5 children. He is not able to exercise routinely. He has remote tobacco history. There is no alcohol. He states he did lose more weight but has regained some weight back.  ROS is negative for fevers, chills or night sweats. He denies change in vision or hearing. There is no adenopathy. He denies wheezing. He denies cough or increased production. He denies presyncope or syncope. He admits to 100% compliance with his CPAP. He cannot sleep without it. He denies residual daytime sleepiness. He is unaware of breakthrough snoring. He denies chest pressure. He denies palpitations. He denies change in bowel or bladder habits. He does denies any bleeding. He does note some mild swelling of his left leg more than his right and this resolved on his recent increase furosemide dosing. He denies paresthesias. He is unaware of thyroid issues. He denies tremors. Other comprehensive 14 point system review is negative.  PE BP 130/84  Pulse 75  Ht 6\' 2"  (1.88 m)  Wt 345 lb 6.4 oz (156.672 kg)  BMI 44.33 kg/m2  General: Alert, oriented, no distress. Morbidly obese. Skin: normal turgor, no rashes HEENT: Normocephalic,  atraumatic. Pupils round and reactive; sclera anicteric;no lid lag.  Nose without nasal septal hypertrophy Mouth/Parynx benign; Mallinpatti scale 4 Neck: Very thick neck; No JVD, no carotid briuts; normal carotid upstroke Lungs: clear to ausculatation and percussion; no wheezing or rales Chest wall: Nontender to palpation Heart: RRR, s1 s2 normal 1-8/2 systolic murmur. Abdomen: Moderate central adiposity soft, nontender; no hepatosplenomehaly, BS+; abdominal aorta nontender and not dilated by palpation. Back: No CVA tenderness Pulses 2+ Extremities: 1+ left leg/ankle swelling. Trace edema right ankle; no clubbing cyanosis, Homan's sign negative  Neurologic: grossly nonfocal Psychological: Normal affect and  mood  ECG (independently read by me): Normal sinus rhythm at 75 beats per minute. Borderline first degree AV block with a PR interval of 240 ms. QTC interval normal at 444 ms. No significant ST changes.   Prior ECG from 06/13/2013 : Sinus rhythm at 55 beats per minute. PR interval 202 ms. QTC interval 443 ms.  LABS:  BMET    Component Value Date/Time   NA 136 12/23/2012 0710   K 4.3 12/23/2012 0710   CL 98 12/23/2012 0710   CO2 24 12/23/2012 0710   GLUCOSE 198* 12/23/2012 0710   BUN 19 12/23/2012 0710   CREATININE 1.25 12/23/2012 0710   CALCIUM 9.2 12/23/2012 0710   GFRNONAA 56* 12/23/2012 0710   GFRAA 65* 12/23/2012 0710     Hepatic Function Panel     Component Value Date/Time   PROT 7.9 12/16/2012 1356   ALBUMIN 4.2 12/16/2012 1356   AST 29 12/16/2012 1356   ALT 27 12/16/2012 1356   ALKPHOS 43 12/16/2012 1356   BILITOT 0.3 12/16/2012 1356     CBC    Component Value Date/Time   WBC 10.5 12/23/2012 0710   RBC 3.32* 12/23/2012 0710   HGB 8.7* 12/23/2012 0710   HCT 26.2* 12/23/2012 0710   PLT 146* 12/23/2012 0710   MCV 78.9 12/23/2012 0710   MCH 26.2 12/23/2012 0710   MCHC 33.2 12/23/2012 0710   RDW 14.7 12/23/2012 0710   LYMPHSABS 3.1 12/16/2012 1356   MONOABS 0.9 12/16/2012 1356     EOSABS 0.2 12/16/2012 1356   BASOSABS 0.0 12/16/2012 1356     BNP No results found for this basename: probnp    Lipid Panel  No results found for this basename: chol,  trig,  hdl,  cholhdl,  vldl,  ldlcalc     RADIOLOGY: No results found.    ASSESSMENT AND PLAN: Mr. Fonder has significant cardiovascular comorbidities including hypertension with grade 1 diastolic dysfunction, type 2 diabetes mellitus, mixed hyperlipidemia, severe sleep apnea with CPAP therapy, morbid obesity. He recently had a 18 pound weight gain over a three-week period and was mildly volume overloaded necessitating an increased dose of his furosemide. He now has been back to taking 40 mg in the morning and 20 mg in the afternoon. I suggested that he change this to 60 mg in the morning but he does note some occasional afternoon leg edema he can take an extra Lasix 20-40 mg in the afternoon. His blood pressure today is improved. He has lost 6 pounds over the past week. We did discuss trying to get a weight less than 300 pounds at all possible. He did have laboratory done by Dr. Virgina Jock. I will obtain these results for my review. He continues to use CPAP with 100% compliance. He is tolerating his ACE inhibition. He's not having palpitations. I will see him in 6 months for cardiology reevaluation.   Troy Sine, MD, [facility]  12/26/2013 9:48 AM

## 2013-12-26 NOTE — Patient Instructions (Signed)
Your physician recommends that you schedule a follow-up appointment in: 6 Months  Your physician has recommended you make the following change in your medication: Take Furosemide 60 mg in the morning

## 2014-02-01 ENCOUNTER — Telehealth: Payer: Self-pay | Admitting: *Deleted

## 2014-02-01 NOTE — Telephone Encounter (Signed)
Patient called to report that his CPAP machine " cuts off and on"  Throughout the night. He called [facility] to try to get some assistance with getting a new machine and they told the patient that he would need to contact his insurance company to see whether he qualifies for a new machine. Patient is dissatisfied with the service that he is recieving from [facility], and requests to be tranfered to [facility] where his wife is a client. Order along with sleep study and other required records were faxed over to [facility] @ [phone].

## 2014-02-08 ENCOUNTER — Telehealth: Payer: Self-pay | Admitting: *Deleted

## 2014-02-08 NOTE — Telephone Encounter (Signed)
Faxed CPAP supply orders to [facility].

## 2014-03-06 ENCOUNTER — Encounter: Payer: Self-pay | Admitting: Podiatry

## 2014-03-06 ENCOUNTER — Ambulatory Visit (INDEPENDENT_AMBULATORY_CARE_PROVIDER_SITE_OTHER): Payer: 59 | Admitting: Podiatry

## 2014-03-06 VITALS — BP 178/104 | HR 70 | Resp 16

## 2014-03-06 DIAGNOSIS — M79609 Pain in unspecified limb: Secondary | ICD-10-CM

## 2014-03-06 DIAGNOSIS — B351 Tinea unguium: Secondary | ICD-10-CM

## 2014-03-06 NOTE — Patient Instructions (Signed)
Diabetes and Foot Care Diabetes may cause you to have problems because of poor blood supply (circulation) to your feet and legs. This may cause the skin on your feet to become thinner, break easier, and heal more slowly. Your skin may become dry, and the skin may peel and crack. You may also have nerve damage in your legs and feet causing decreased feeling in them. You may not notice minor injuries to your feet that could lead to infections or more serious problems. Taking care of your feet is one of the most important things you can do for yourself.  HOME CARE INSTRUCTIONS  Wear shoes at all times, even in the house. Do not go barefoot. Bare feet are easily injured.  Check your feet daily for blisters, cuts, and redness. If you cannot see the bottom of your feet, use a mirror or ask someone for help.  Wash your feet with warm water (do not use hot water) and mild soap. Then pat your feet and the areas between your toes until they are completely dry. Do not soak your feet as this can dry your skin.  Apply a moisturizing lotion or petroleum jelly (that does not contain alcohol and is unscented) to the skin on your feet and to dry, brittle toenails. Do not apply lotion between your toes.  Trim your toenails straight across. Do not dig under them or around the cuticle. File the edges of your nails with an emery board or nail file.  Do not cut corns or calluses or try to remove them with medicine.  Wear clean socks or stockings every day. Make sure they are not too tight. Do not wear knee-high stockings since they may decrease blood flow to your legs.  Wear shoes that fit properly and have enough cushioning. To break in new shoes, wear them for just a few hours a day. This prevents you from injuring your feet. Always look in your shoes before you put them on to be sure there are no objects inside.  Do not cross your legs. This may decrease the blood flow to your feet.  If you find a minor scrape,  cut, or break in the skin on your feet, keep it and the skin around it clean and dry. These areas may be cleansed with mild soap and water. Do not cleanse the area with peroxide, alcohol, or iodine.  When you remove an adhesive bandage, be sure not to damage the skin around it.  If you have a wound, look at it several times a day to make sure it is healing.  Do not use heating pads or hot water bottles. They may burn your skin. If you have lost feeling in your feet or legs, you may not know it is happening until it is too late.  Make sure your health care provider performs a complete foot exam at least annually or more often if you have foot problems. Report any cuts, sores, or bruises to your health care provider immediately. SEEK MEDICAL CARE IF:   You have an injury that is not healing.  You have cuts or breaks in the skin.  You have an ingrown nail.  You notice redness on your legs or feet.  You feel burning or tingling in your legs or feet.  You have pain or cramps in your legs and feet.  Your legs or feet are numb.  Your feet always feel cold. SEEK IMMEDIATE MEDICAL CARE IF:   There is increasing redness,   swelling, or pain in or around a wound.  There is a red line that goes up your leg.  Pus is coming from a wound.  You develop a fever or as directed by your health care provider.  You notice a bad smell coming from an ulcer or wound. Document Released: 11/14/2000 Document Revised: 07/20/2013 Document Reviewed: 04/26/2013 [employer] Patient Information 2014 [employer].  

## 2014-03-06 NOTE — Progress Notes (Signed)
Subjective:     Patient ID: Bradley Alvarez, male   DOB: 17-Apr-1941, 73 y.o.   MRN: [id number]  HPI patient presents with thick deformed nailbeds 1-5 both feet that are painful and hard to cut   Review of Systems     Objective:   Physical Exam Neurovascular status intact with thick deformed nailbeds 1-5 both feet that are painful when pressed    Assessment:     Mycotic nail infection with pain 1-5 both feet    Plan:     Debridement painful nailbeds 1-5 both feet with no iatrogenic bleeding noted

## 2014-04-04 ENCOUNTER — Ambulatory Visit (INDEPENDENT_AMBULATORY_CARE_PROVIDER_SITE_OTHER): Payer: 59 | Admitting: Cardiovascular Disease

## 2014-04-04 VITALS — BP 121/84 | HR 64 | Ht 74.0 in | Wt 335.2 lb

## 2014-04-04 DIAGNOSIS — R609 Edema, unspecified: Secondary | ICD-10-CM

## 2014-04-04 DIAGNOSIS — E7800 Pure hypercholesterolemia, unspecified: Secondary | ICD-10-CM

## 2014-04-04 DIAGNOSIS — IMO0002 Reserved for concepts with insufficient information to code with codable children: Secondary | ICD-10-CM

## 2014-04-04 DIAGNOSIS — E118 Type 2 diabetes mellitus with unspecified complications: Secondary | ICD-10-CM

## 2014-04-04 DIAGNOSIS — I10 Essential (primary) hypertension: Secondary | ICD-10-CM

## 2014-04-04 DIAGNOSIS — R600 Localized edema: Secondary | ICD-10-CM

## 2014-04-04 DIAGNOSIS — E119 Type 2 diabetes mellitus without complications: Secondary | ICD-10-CM

## 2014-04-04 DIAGNOSIS — E1165 Type 2 diabetes mellitus with hyperglycemia: Secondary | ICD-10-CM

## 2014-04-04 DIAGNOSIS — G4733 Obstructive sleep apnea (adult) (pediatric): Secondary | ICD-10-CM

## 2014-04-04 MED ORDER — CARVEDILOL 25 MG PO TABS: ORAL_TABLET | ORAL | Status: DC

## 2014-04-04 NOTE — Patient Instructions (Signed)
Your physician recommends that you schedule a follow-up appointment in: 1 year no changes were made in your therapy today.

## 2014-04-15 ENCOUNTER — Encounter: Payer: Self-pay | Admitting: Cardiovascular Disease

## 2014-04-15 NOTE — Progress Notes (Signed)
Patient ID: Bradley Alvarez, male   DOB: 04-Apr-1941, 73 y.o.   MRN: [id number]       HPI: Bradley Alvarez will is a 73 y.o. male presents to the office for a follow-up cardiology and sleep evaluation.  Bradley Alvarez has a history of hypertension, type 2 diabetes mellitus, mixed hyperlipidemia, severe sleep apnea with CPAP therapy, morbid obesity, documented grade 1 diastolic dysfunction, and when I last saw him he was evaluated preoperatively prior to undergoing left total knee replacement surgery. He was given clearance to undergo the surgery and apparently had this done on his left knee on 12/20/2012 without cardiovascular complications. He did require antibiotics postoperatively.   In the past he has had issues  with shortness of breath and leg swelling. He has documented grade 1 diastolic dysfunction. He did see Dr. Virgina Jock who altered his furosemide for several days to 80 mg in the morning which resulted in a 6 pound weight loss of volume overload.  He has been on Maxzide, lisinopril 40 mg twice a day, furosemide 60 mg in addition to fallopian 10 mg in carvedilol 25 mg twice a day for blood pressure.  He now is on metformin 1000 twice a day, [place] for his diabetes mellitus.  Over the past month, he is very conscientious and attempting weight loss.  He has lost 14 pounds.  He cannot recall the last time he weighed less than 300 pounds.   With reference to his obstructive sleep apnea, he now has a new Resmed AirSense 10 AutoSet CPAP unit and is set at a 5 cm and a maximum pressure of 20 cm.  This mass is a [employer] full.  Face mask large.  I did obtain a download from 03/05/2014 through 04/03/2014.  AHI is excellent at 1.4, with an apnea index of only 0.6.  His 95th percentile.  Pressure was 9.9.  He did not have any central apnea events.  He is 100% compliant, averaging 9 hours and 11 minutes of usage daily.  A new Epworth Sleepiness Scale score was calculated and is still elevated t today  at 17 consistent with residual hypersomnolence.  This was filled out by his wife.  The patient denies some of her assessment, but is greatest chance of dozing is while lying down to rest in the afternoon and circumstances persists, watching television, and as a passenger in a car for an hour without a break.  He's unaware of breakthrough snoring.  He feels that his new CPAP unit is significantly improved from his prior one.  He believes his sleep is much more restful.  Past Medical History  Diagnosis Date  . Diabetes mellitus type 2, uncontrolled, with complications   . Hypercholesteremia   . Benign prostate hyperplasia   . Morbid obesity with BMI of 45.0-49.9, adult   . Lichen planus   . Left knee DJD   . Shortness of breath     WITH EXERTION   . Sleep apnea, obstructive 10/14/08    [facility] AHI duuring total sleep was 61.8/hr and during REMsleep 0.0/hr RDI during total sleep was 96.1/hr. and during REM sleep at 0.0/hr. The average O2 sat range during REM and NREM was 94% The lowest O2 Sat during REM and NREM was 85%  Severe OSA /hypopnea syndrome. Notably events were worse in the supine position and during REM sleep.   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 Aortic  valve sclerosis   . Hypertension 10/02/11    lexiscan myoview negative for ischemia EF 46%low risk scan.  ECHO 12/07/12 No change in previous echo.    Past Surgical History  Procedure Laterality Date  . 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 Suprapubic prostatectomy    . Knee arthroscopy      RT KNEE   . Total knee arthroplasty  12/20/2012    Procedure: TOTAL KNEE ARTHROPLASTY;  Surgeon: Lorn Junes, MD;  Location: [place];  Service: Orthopedics;  Laterality: Left;  left total knee arthroplasty  . Cardiac catheterization      No Known Allergies  Current Outpatient Prescriptions  Medication Sig Dispense Refill  . aspirin 81 MG tablet Take 81 mg by mouth  daily.      Marland Kitchen BYETTA 10 MCG PEN 10 MCG/0.04ML SOPN injection Inject 10 mcg into the skin 2 (two) times daily.      . carvedilol (COREG) 25 MG tablet TAKE 1 TABLET TWICE A DAY  180 tablet  3  . clobetasol ointment (TEMOVATE) 5.02 % Apply 1 application topically once a week.      . docusate sodium 100 MG CAPS 1 tab 2 times a day while on narcotics.  STOOL SOFTENER  60 capsule  0  . felodipine (PLENDIL) 5 MG 24 hr tablet Take 10 mg by mouth daily.      . fluticasone (CUTIVATE) 0.005 % ointment Apply 1 application topically 2 (two) times daily.      . furosemide (LASIX) 40 MG tablet 40 mg. 60 mg in the morning      . HYDROcodone-acetaminophen (NORCO/VICODIN) 5-325 MG per tablet Take 1 tablet by mouth as needed.      Marland Kitchen lisinopril (PRINIVIL,ZESTRIL) 40 MG tablet Take 40 mg by mouth 2 (two) times daily.       . metFORMIN (GLUCOPHAGE) 1000 MG tablet Take 1,000 mg by mouth 2 (two) times daily with a meal.      . Multiple Vitamins-Minerals ([employer]S SPECTRAVITE SENIOR PO) Take 1 tablet by mouth daily.       Marland Kitchen oxyCODONE (OXY IR/ROXICODONE) 5 MG immediate release tablet Take 1-2 tablets (5-10 mg total) by mouth every 3 (three) hours as needed.  100 tablet  0  . potassium chloride SA (K-DUR,KLOR-CON) 20 MEQ tablet Take 20 mEq by mouth daily.       . simvastatin (ZOCOR) 40 MG tablet Take 40 mg by mouth every evening.      . Tamsulosin HCl (FLOMAX) 0.4 MG CAPS Take 1 capsule (0.4 mg total) by mouth daily after breakfast.  30 capsule  3  . triamterene-hydrochlorothiazide (MAXZIDE-25) 37.5-25 MG per tablet Take 1 tablet by mouth daily.       No current facility-administered medications for this visit.    Socially he is married and has 5 children. He is not able to exercise routinely. He has remote tobacco history. There is no alcohol. He states he did lose more weight but has regained some weight back.  ROS is negative for fevers, chills or night sweats. He denies change in vision or hearing. There is no  adenopathy. He denies wheezing. He denies cough or increased production. He denies presyncope or syncope. He admits to 100% compliance with his CPAP. He cannot sleep without it. He denies residual daytime sleepiness. He is unaware of breakthrough snoring. He denies chest pressure. He denies palpitations. He denies change in bowel or bladder habits. He does denies any bleeding.  He does note some mild swelling of his left leg more than his right and this resolved on his recent increase furosemide dosing. He denies paresthesias. He is unaware of thyroid issues. He denies tremors. Other comprehensive 14 point system review is negative.  PE BP 121/84  Pulse 64  Ht 6\' 2"  (1.88 m)  Wt 335 lb 3.2 oz (152.046 kg)  BMI 43.02 kg/m2  General: Alert, oriented, no distress. Morbidly obese. Skin: normal turgor, no rashes HEENT: Normocephalic, atraumatic. Pupils round and reactive; sclera anicteric;no lid lag.  Nose without nasal septal hypertrophy Mouth/Parynx benign; Mallinpatti scale 4 Neck: Very thick neck; No JVD, no carotid bruits; normal carotid upstroke Lungs: clear to ausculatation and percussion; no wheezing or rales Chest wall: Nontender to palpation Heart: RRR, s1 s2 normal 4-3/1 systolic murmur.  No S3 or S4 gallop heard.  No diastolic murmur, rubs, thrills or heaves Abdomen: Moderate central adiposity soft, nontender; no hepatosplenomehaly, BS+; abdominal aorta nontender and not dilated by palpation. Back: No CVA tenderness Pulses 2+ Extremities: Improved.  Previously edema, now, only trivial. ; no clubbing cyanosis, Homan's sign negative  Neurologic: grossly nonfocal Psychological: Normal affect and mood  Prior ECG (independently read by me): Normal sinus rhythm at 75 beats per minute. Borderline first degree AV block with a PR interval of 240 ms. QTC interval normal at 444 ms. No significant ST changes.   Prior ECG from 06/13/2013 : Sinus rhythm at 55 beats per minute. PR interval 202 ms.  QTC interval 443 ms.  LABS:  BMET    Component Value Date/Time   NA 136 12/23/2012 0710   K 4.3 12/23/2012 0710   CL 98 12/23/2012 0710   CO2 24 12/23/2012 0710   GLUCOSE 198* 12/23/2012 0710   BUN 19 12/23/2012 0710   CREATININE 1.25 12/23/2012 0710   CALCIUM 9.2 12/23/2012 0710   GFRNONAA 56* 12/23/2012 0710   GFRAA 65* 12/23/2012 0710     Hepatic Function Panel     Component Value Date/Time   PROT 7.9 12/16/2012 1356   ALBUMIN 4.2 12/16/2012 1356   AST 29 12/16/2012 1356   ALT 27 12/16/2012 1356   ALKPHOS 43 12/16/2012 1356   BILITOT 0.3 12/16/2012 1356     CBC    Component Value Date/Time   WBC 10.5 12/23/2012 0710   RBC 3.32* 12/23/2012 0710   HGB 8.7* 12/23/2012 0710   HCT 26.2* 12/23/2012 0710   PLT 146* 12/23/2012 0710   MCV 78.9 12/23/2012 0710   MCH 26.2 12/23/2012 0710   MCHC 33.2 12/23/2012 0710   RDW 14.7 12/23/2012 0710   LYMPHSABS 3.1 12/16/2012 1356   MONOABS 0.9 12/16/2012 1356   EOSABS 0.2 12/16/2012 1356   BASOSABS 0.0 12/16/2012 1356     BNP No results found for this basename: probnp    Lipid Panel  No results found for this basename: chol,  trig,  hdl,  cholhdl,  vldl,  ldlcalc     RADIOLOGY: No results found.    ASSESSMENT AND PLAN: Bradley Alvarez has significant cardiovascular comorbidities including hypertension with grade 1 diastolic dysfunction, type 2 diabetes mellitus, mixed hyperlipidemia, severe sleep apnea with CPAP therapy, and morbid obesity.  I commended him on his 14 pound weight loss over the last month, but his weight today is not significantly different from last summer.  He had gained weight over the holidays.  I discussed the weight goal absolutely less than 300 pounds.  Presently, his blood pressure is well controlled on his  multiple medical regimen as listed above.  He denies significant change in symptoms.  He is not short of breath.  He is not having any chest pain.  His peripheral edema has improved.  With reference to his sleep apnea, he  is meeting compliance and is averaging 9 hours and 11 minutes of CPAP use daily.  He is on a AutoSet unit and is 99 percentile.  Pressure was 9.9, with a maximum of 11.8.  Age is excellent at 1.4.  He still has residual daytime sleepiness.  However, with his cardiovascular morbidities, and the fact that he does feel significantly improved.  I do not feel additional therapy with Provigil or Nuvigil is warranted.  He saw Dr. Virgina Jock for his primary care.  As long as he remains stable, I will see him in one year for cardiology and sleep followup evaluation. Troy Sine, MD, [facility]  04/15/2014 7:28 AM

## 2014-05-29 ENCOUNTER — Ambulatory Visit (INDEPENDENT_AMBULATORY_CARE_PROVIDER_SITE_OTHER): Payer: 59 | Admitting: Podiatry

## 2014-05-29 ENCOUNTER — Encounter: Payer: Self-pay | Admitting: Podiatry

## 2014-05-29 DIAGNOSIS — M79609 Pain in unspecified limb: Secondary | ICD-10-CM

## 2014-05-29 DIAGNOSIS — M79673 Pain in unspecified foot: Secondary | ICD-10-CM

## 2014-05-29 DIAGNOSIS — B351 Tinea unguium: Secondary | ICD-10-CM

## 2014-05-29 NOTE — Patient Instructions (Signed)
Diabetes and Foot Care Diabetes may cause you to have problems because of poor blood supply (circulation) to your feet and legs. This may cause the skin on your feet to become thinner, break easier, and heal more slowly. Your skin may become dry, and the skin may peel and crack. You may also have nerve damage in your legs and feet causing decreased feeling in them. You may not notice minor injuries to your feet that could lead to infections or more serious problems. Taking care of your feet is one of the most important things you can do for yourself.  HOME CARE INSTRUCTIONS  Wear shoes at all times, even in the house. Do not go barefoot. Bare feet are easily injured.  Check your feet daily for blisters, cuts, and redness. If you cannot see the bottom of your feet, use a mirror or ask someone for help.  Wash your feet with warm water (do not use hot water) and mild soap. Then pat your feet and the areas between your toes until they are completely dry. Do not soak your feet as this can dry your skin.  Apply a moisturizing lotion or petroleum jelly (that does not contain alcohol and is unscented) to the skin on your feet and to dry, brittle toenails. Do not apply lotion between your toes.  Trim your toenails straight across. Do not dig under them or around the cuticle. File the edges of your nails with an emery board or nail file.  Do not cut corns or calluses or try to remove them with medicine.  Wear clean socks or stockings every day. Make sure they are not too tight. Do not wear knee-high stockings since they may decrease blood flow to your legs.  Wear shoes that fit properly and have enough cushioning. To break in new shoes, wear them for just a few hours a day. This prevents you from injuring your feet. Always look in your shoes before you put them on to be sure there are no objects inside.  Do not cross your legs. This may decrease the blood flow to your feet.  If you find a minor scrape,  cut, or break in the skin on your feet, keep it and the skin around it clean and dry. These areas may be cleansed with mild soap and water. Do not cleanse the area with peroxide, alcohol, or iodine.  When you remove an adhesive bandage, be sure not to damage the skin around it.  If you have a wound, look at it several times a day to make sure it is healing.  Do not use heating pads or hot water bottles. They may burn your skin. If you have lost feeling in your feet or legs, you may not know it is happening until it is too late.  Make sure your health care provider performs a complete foot exam at least annually or more often if you have foot problems. Report any cuts, sores, or bruises to your health care provider immediately. SEEK MEDICAL CARE IF:   You have an injury that is not healing.  You have cuts or breaks in the skin.  You have an ingrown nail.  You notice redness on your legs or feet.  You feel burning or tingling in your legs or feet.  You have pain or cramps in your legs and feet.  Your legs or feet are numb.  Your feet always feel cold. SEEK IMMEDIATE MEDICAL CARE IF:   There is increasing redness,   swelling, or pain in or around a wound.  There is a red line that goes up your leg.  Pus is coming from a wound.  You develop a fever or as directed by your health care provider.  You notice a bad smell coming from an ulcer or wound. Document Released: 11/14/2000 Document Revised: 07/20/2013 Document Reviewed: 04/26/2013 ExitCare Patient Information 2015 [employer]. This information is not intended to replace advice given to you by your health care provider. Make sure you discuss any questions you have with your health care provider.  

## 2014-05-30 NOTE — Progress Notes (Signed)
Subjective:     Patient ID: Bradley Alvarez, male   DOB: 1941/02/01, 73 y.o.   MRN: [id number]  HPI patient presents with thick yellow crusted nailbeds 1-5 both feet that are painful   Review of Systems     Objective:   Physical Exam Neurovascular status unchanged with thick yellow painful nailbeds 1-5 both feet    Assessment:     Mycotic nail infection is with pain 1-5 both feet    Plan:     Debris painful nailbeds 1-5 both feet with no iatrogenic bleeding noted

## 2014-07-10 ENCOUNTER — Telehealth: Payer: Self-pay | Admitting: Cardiovascular Disease

## 2014-07-10 NOTE — Telephone Encounter (Signed)
Spoke with pt, he started having a dull, pressure in his chest Thursday night. The discomfort comes and goes and gets worse with movement and getting up and down. He does report burping made it feel better. He has taken a pain pill this morning and it is easing off. Reassurance given to pt, it does not sound heart related. The patient would like to be seen. Follow up scheduled with brittany simmons pa c tomorrow. Pt agreed with this plan.

## 2014-07-10 NOTE — Telephone Encounter (Signed)
New Prob    States pt has been having intermittent chest pain described as pressure onset 4 days. Requesting to speak to a nurse.

## 2014-07-11 ENCOUNTER — Encounter: Payer: Self-pay | Admitting: Cardiovascular Disease

## 2014-07-11 ENCOUNTER — Ambulatory Visit (INDEPENDENT_AMBULATORY_CARE_PROVIDER_SITE_OTHER): Payer: 59 | Admitting: Cardiology

## 2014-07-11 ENCOUNTER — Encounter: Payer: Self-pay | Admitting: Cardiology

## 2014-07-11 VITALS — BP 142/86 | HR 63 | Ht 74.0 in | Wt 346.9 lb

## 2014-07-11 DIAGNOSIS — R0609 Other forms of dyspnea: Secondary | ICD-10-CM

## 2014-07-11 DIAGNOSIS — R0600 Dyspnea, unspecified: Secondary | ICD-10-CM

## 2014-07-11 DIAGNOSIS — R0989 Other specified symptoms and signs involving the circulatory and respiratory systems: Secondary | ICD-10-CM

## 2014-07-11 DIAGNOSIS — R0789 Other chest pain: Secondary | ICD-10-CM

## 2014-07-11 NOTE — Patient Instructions (Signed)
Schedule Lexiscan Myoview follow instructions given   Make appointment with Dr.Kelly in 3 months

## 2014-07-11 NOTE — Progress Notes (Signed)
07/11/2014 Bradley Alvarez   Sep 08, 1941  [id number]  Primary Physicia Bradley Reel, MD Primary Cardiologist: Dr. Claiborne Alvarez  HPI:  Mr. Bradley Alvarez is a 73 years old AAM with a history of hypertension, type 2 diabetes mellitus, mixed hyperlipidemia, severe sleep apnea with CPAP therapy, morbid obesity and documented grade 1 diastolic dysfunction. He had a Lexiscan nuc study in 2012 that was low risk for ischemia. His last 2D echo was in January 2014 demonstrating normal systolic function with an estimated EF of 50-55%. Regional wall motion abnormalities could not be excluded. The aortic valve was mildly thickened with mildly calcified leaflets. There was sclerosis without stenosis. He is followed by Dr. Claiborne Alvarez. Dr. Virgina Alvarez follows him medically. He was last seen and evaluated by Dr. Claiborne Alvarez on 12/26/2013 for routine office visit. At that time, he had endorse an 18 pound weight gain over a three-week period and was mildly volume overloaded necessitating an increased dose of his furosemide. He otherwise had no other issues and denied chest pain. He was instructed by Dr. Claiborne Alvarez to return in 6 months for reevaluation.  Today, he presents to clinic as an acute care visit. He was referred by his PCP, Dr. Virgina Alvarez, for evaluation of chest pain. He is accompanied by his wife. He states that for the last 3 months, he has been exercising regularly at a gym. He engages in light weight training as well as light cardio, exercising on a recumbant bicycle and elliptical. He denies any exertional chest pain with any of these activities. However, he recalls engaging in a resistance exercise that required and overhead pull-down maneuver (trapezius, latisimus dorsi muscle exersise). He did this with a heavy amount of weight. He had no pain during the exercise, however that evening while at home he noticed substernal chest discomfort, sharp in nature and worse with movement. Was also pleuritic and was exacerbated by positional changes. He  denied any other associated symptoms. His pain resolved with pain meds.   He is currently chest pain free. He denies any recent chest pressure, heaviness, tightness or burning. However, he notes more increased DOE walking short distances and notes slight fatige. No dyspnea at rest. No orthopnea, PND LEE or weight gain recently, although he has had to increase his lasix for several days a few times in the last several months due to mild volume overload.      Current Outpatient Prescriptions  Medication Sig Dispense Refill  . aspirin 81 MG tablet Take 81 mg by mouth daily.      Marland Kitchen BYETTA 10 MCG PEN 10 MCG/0.04ML SOPN injection Inject 10 mcg into the skin 2 (two) times daily.      . carvedilol (COREG) 25 MG tablet TAKE 1 TABLET TWICE A DAY  180 tablet  3  . clobetasol ointment (TEMOVATE) 7.85 % Apply 1 application topically once a week.      . docusate sodium 100 MG CAPS 1 tab 2 times a day while on narcotics.  STOOL SOFTENER  60 capsule  0  . felodipine (PLENDIL) 5 MG 24 hr tablet Take 10 mg by mouth daily.      . fluticasone (CUTIVATE) 0.005 % ointment Apply 1 application topically 2 (two) times daily.      . furosemide (LASIX) 40 MG tablet 40 mg. 60 mg in the morning      . HYDROcodone-acetaminophen (NORCO/VICODIN) 5-325 MG per tablet Take 1 tablet by mouth as needed.      Marland Kitchen ketoconazole (NIZORAL) 2 %  shampoo Apply 1 application topically every 30 (thirty) days.      Astrid Drafts Omega-3 300 MG CAPS Take 1 capsule by mouth daily.      Marland Kitchen lisinopril (PRINIVIL,ZESTRIL) 40 MG tablet Take 40 mg by mouth 2 (two) times daily.       . metFORMIN (GLUCOPHAGE) 1000 MG tablet Take 1,000 mg by mouth 2 (two) times daily with a meal.      . Multiple Vitamins-Minerals ([employer] SPECTRAVITE SENIOR PO) Take 1 tablet by mouth daily.       . potassium chloride SA (K-DUR,KLOR-CON) 20 MEQ tablet Take 20 mEq by mouth daily.       . simvastatin (ZOCOR) 40 MG tablet Take 40 mg by mouth every evening.      .  triamterene-hydrochlorothiazide (MAXZIDE-25) 37.5-25 MG per tablet Take 1 tablet by mouth daily.      . urea (CARMOL) 10 % cream Apply 1 application topically once a week.      Marland Kitchen oxyCODONE (OXY IR/ROXICODONE) 5 MG immediate release tablet Take 1-2 tablets (5-10 mg total) by mouth every 3 (three) hours as needed.  100 tablet  0  . Tamsulosin HCl (FLOMAX) 0.4 MG CAPS Take 1 capsule (0.4 mg total) by mouth daily after breakfast.  30 capsule  3   No current facility-administered medications for this visit.    No Known Allergies  History   Social History  . Marital Status: Married    Spouse Name: N/A    Number of Children: N/A  . Years of Education: N/A   Occupational History  . Not on file.   Social History Main Topics  . Smoking status: Former Smoker -- 1.50 packs/day for 10 years    Quit date: 12/14/1971  . Smokeless tobacco: Not on file  . Alcohol Use: Yes     Comment: 1/5 of liqour will last him about 2 weeks  . Drug Use: No  . Sexual Activity: Not on file   Other Topics Concern  . Not on file   Social History Narrative  . No narrative on file     Review of Systems: General: negative for chills, fever, night sweats or weight changes.  Cardiovascular: negative for chest pain, dyspnea on exertion, edema, orthopnea, palpitations, paroxysmal nocturnal dyspnea or shortness of breath Dermatological: negative for rash Respiratory: negative for cough or wheezing Urologic: negative for hematuria Abdominal: negative for nausea, vomiting, diarrhea, bright red blood per rectum, melena, or hematemesis Neurologic: negative for visual changes, syncope, or dizziness All other systems reviewed and are otherwise negative except as noted above.    Blood pressure 142/86, pulse 63, height 6\' 2"  (1.88 m), weight 346 lb 14.4 oz (157.353 kg).  General appearance: alert, cooperative, no distress and moderately obese Neck: no carotid bruit and no JVD Lungs: clear to auscultation  bilaterally Heart: regular rate and rhythm, S1, S2 normal, no murmur, click, rub or gallop Extremities: 1+ bilateral LEE Pulses: 2+ and symmetric Skin: warm and dry Neurologic: Grossly normal  EKG NSR 63 bpm. No ischemic changes.   ASSESSMENT AND PLAN:   1. Chest pain: recent chest pain complaints sound more compatible with musculoskeletal pain. However, he has multiple cardiac risk factors including obesity, HTN,  HLD and DM. He has not had an ischemic eval since 2012. Given he is diabetic and elderly (age 67), he may be at risk for "slient ischemia". He does complain of DOE. I have recommended that he undergo a Lexiscan NST to r/o ischemia, as  it has been more than 3 years since his last assessment.   2. Diastolic HF: he has evidence of mild volume overload with 1+ bilateral pitting LEE. He notes mild DOE, but no resting dyspnea and no orthopnea or PND.  I recommended that he increase his lasix x 2 days. Avoid salt intake.   PLAN  Will plan for a nuclear stress test to r/o ischemia. Also increase lasix x 2 days for extra diuresis. F/U if NST is abnormal, otherwise with Dr. Claiborne Alvarez in 3 months.   [place] Dalporto, BRITTAINYPA-C 07/11/2014 8:52 AM

## 2014-07-14 ENCOUNTER — Telehealth (HOSPITAL_COMMUNITY): Payer: Self-pay

## 2014-07-14 NOTE — Telephone Encounter (Signed)
Encounter complete. 

## 2014-07-19 ENCOUNTER — Ambulatory Visit (HOSPITAL_COMMUNITY)
Admission: RE | Admit: 2014-07-19 | Discharge: 2014-07-19 | Disposition: A | Discharge status: Home / Self Care | Payer: 59 | Source: Ambulatory Visit | Attending: Cardiology | Admitting: Cardiology

## 2014-07-19 DIAGNOSIS — R0989 Other specified symptoms and signs involving the circulatory and respiratory systems: Secondary | ICD-10-CM

## 2014-07-19 DIAGNOSIS — I10 Essential (primary) hypertension: Secondary | ICD-10-CM | POA: Insufficient documentation

## 2014-07-19 DIAGNOSIS — I359 Nonrheumatic aortic valve disorder, unspecified: Secondary | ICD-10-CM | POA: Insufficient documentation

## 2014-07-19 DIAGNOSIS — Z87891 Personal history of nicotine dependence: Secondary | ICD-10-CM | POA: Diagnosis not present

## 2014-07-19 DIAGNOSIS — R079 Chest pain, unspecified: Secondary | ICD-10-CM | POA: Insufficient documentation

## 2014-07-19 DIAGNOSIS — E669 Obesity, unspecified: Secondary | ICD-10-CM | POA: Diagnosis not present

## 2014-07-19 DIAGNOSIS — R0789 Other chest pain: Secondary | ICD-10-CM

## 2014-07-19 DIAGNOSIS — Z794 Long term (current) use of insulin: Secondary | ICD-10-CM | POA: Diagnosis not present

## 2014-07-19 DIAGNOSIS — Z6841 Body Mass Index (BMI) 40.0 and over, adult: Secondary | ICD-10-CM | POA: Diagnosis not present

## 2014-07-19 DIAGNOSIS — Z8249 Family history of ischemic heart disease and other diseases of the circulatory system: Secondary | ICD-10-CM | POA: Diagnosis not present

## 2014-07-19 DIAGNOSIS — E119 Type 2 diabetes mellitus without complications: Secondary | ICD-10-CM | POA: Insufficient documentation

## 2014-07-19 DIAGNOSIS — I5030 Unspecified diastolic (congestive) heart failure: Secondary | ICD-10-CM | POA: Insufficient documentation

## 2014-07-19 DIAGNOSIS — R0609 Other forms of dyspnea: Secondary | ICD-10-CM | POA: Diagnosis not present

## 2014-07-19 DIAGNOSIS — R0600 Dyspnea, unspecified: Secondary | ICD-10-CM

## 2014-07-19 MED ORDER — REGADENOSON 0.4 MG/5ML IV SOLN
0.4000 mg | Freq: Once | INTRAVENOUS | Status: AC
  Administered 2014-07-19: 0.4 mg via INTRAVENOUS

## 2014-07-19 MED ORDER — TECHNETIUM TC 99M SESTAMIBI GENERIC - CARDIOLITE
30.2000 | Freq: Once | INTRAVENOUS | Status: AC | PRN
  Administered 2014-07-19: 30.2 via INTRAVENOUS

## 2014-07-19 MED ORDER — TECHNETIUM TC 99M SESTAMIBI GENERIC - CARDIOLITE
10.5000 | Freq: Once | INTRAVENOUS | Status: AC | PRN
  Administered 2014-07-19: 11 via INTRAVENOUS

## 2014-07-19 NOTE — Procedures (Addendum)
[facility] [address] [place] [address] [phone]  Cardiology Nuclear Med Study  JOZEF EISENBEIS is a 73 y.o. male     MRN : [id number]     DOB: [birth year]  Procedure Date: 07/19/2014  Nuclear Med Background Indication for Stress Test:  Evaluation for Ischemia History:  heart valve disorder;aortic valve sclerosis;diastolic heart failure;Last NUC MPI on 10/02/2011-nonischemic;EF=58% Cardiac Risk Factors: Family History - CAD, History of Smoking, Hypertension, IDDM Type 2, Lipids and Obesity  Symptoms:  Chest Pain, DOE and Fatigue   Nuclear Pre-Procedure Caffeine/Decaff Intake:  7:00pm NPO After: 5:00am   IV Site: R Hand  IV 0.9% NS with Angio Cath:  22g  Chest Size (in):  56"  IV Started by: Rolene Course, RN  Height: 6\' 2"  (1.88 m)  Cup Size: n/a  BMI:  Body mass index is 44.4 kg/(m^2). Weight:  346 lb (156.945 kg)   Tech Comments:  n/a    Nuclear Med Study 1 or 2 day study: 1 day  Stress Test Type:  Reserve Provider:  Shelva Majestic, MD   Resting Radionuclide: Technetium 24m Sestamibi  Resting Radionuclide Dose: 10.5 mCi   Stress Radionuclide:  Technetium 38m Sestamibi  Stress Radionuclide Dose: 30.2 mCi           Stress Protocol Rest HR: 69 Stress HR: 76  Rest BP: 148/90 Stress BP: 138/72  Exercise Time (min): n/a METS: n/a   Predicted Max HR: 147 bpm % Max HR: 56.46 bpm Rate Pressure Product: 12450  Dose of Adenosine (mg):  n/a Dose of Lexiscan: 0.4 mg  Dose of Atropine (mg): n/a Dose of Dobutamine: n/a mcg/kg/min (at max HR)  Stress Test Technologist: Leane Para, CCT Nuclear Technologist: Imagene Riches, CNMT   Rest Procedure:  Myocardial perfusion imaging was performed at rest 45 minutes following the intravenous administration of Technetium 63m Sestamibi. Stress Procedure:  The patient received IV Lexiscan 0.4 mg over 15-seconds.  Technetium 42m Sestamibi injected Iv at  30-seconds.  There were no significant changes with Lexiscan.  Quantitative spect images were obtained after a 45 minute delay.  Transient Ischemic Dilatation (Normal <1.22):  1.12  QGS EDV:  199 ml QGS ESV:  95 ml LV Ejection Fraction: 53%  Rest ECG: NSR - Normal EKG  Stress ECG: No significant change from baseline ECG  QPS Raw Data Images:  RV uptake is noted Stress Images:  There is decreased uptake in the apex. Rest Images:  Normal homogeneous uptake in all areas of the myocardium. Subtraction (SDS):  No evidence of ischemia.  Impression Exercise Capacity:  Lexiscan with no exercise. BP Response:  Normal blood pressure response. Clinical Symptoms:  No significant symptoms noted. ECG Impression:  No significant ECG changes with Lexiscan. Comparison with Prior Nuclear Study: No significant change from previous study  Overall Impression:  Low risk stress nuclear study with small apical attenuation artifact. No reversible ischemia.  LV Wall Motion:  NL LV Function; NL Wall Motion; EF 53%.  Pixie Casino, MD, [facility] Board Certified in Nuclear Cardiology Attending Cardiologist [place], MD  07/19/2014 5:20 PM

## 2014-07-20 NOTE — Telephone Encounter (Signed)
Encounter complete. 

## 2014-07-28 ENCOUNTER — Encounter: Payer: Self-pay | Admitting: *Deleted

## 2014-08-23 ENCOUNTER — Other Ambulatory Visit: Payer: Self-pay | Admitting: Orthopedic Surgery

## 2014-08-23 DIAGNOSIS — M25572 Pain in left ankle and joints of left foot: Secondary | ICD-10-CM

## 2014-09-03 ENCOUNTER — Ambulatory Visit
Admission: RE | Admit: 2014-09-03 | Discharge: 2014-09-03 | Disposition: A | Discharge status: Home / Self Care | Payer: Medicare Other | Source: Ambulatory Visit | Attending: Orthopedic Surgery | Admitting: Orthopedic Surgery

## 2014-09-03 DIAGNOSIS — M25572 Pain in left ankle and joints of left foot: Secondary | ICD-10-CM

## 2014-09-04 ENCOUNTER — Ambulatory Visit (INDEPENDENT_AMBULATORY_CARE_PROVIDER_SITE_OTHER): Payer: 59 | Admitting: Podiatry

## 2014-09-04 DIAGNOSIS — M79673 Pain in unspecified foot: Secondary | ICD-10-CM

## 2014-09-04 DIAGNOSIS — B351 Tinea unguium: Secondary | ICD-10-CM

## 2014-09-04 NOTE — Progress Notes (Signed)
   Subjective:    Patient ID: Bradley Alvarez, male    DOB: Nov 26, 1941, 73 y.o.   MRN: [id number]  HPI  Nail debridement  Review of Systems     Objective:   Physical Exam        Assessment & Plan:

## 2014-09-05 NOTE — Progress Notes (Signed)
Subjective:     Patient ID: Bradley Alvarez, male   DOB: 02/07/1941, 73 y.o.   MRN: [id number]  HPI patient presents stating my nails are so thick and I cannot reach or cut them myself and they become painful   Review of Systems     Objective:   Physical Exam Neurovascular status intact with thick yellow brittle nailbeds 1-5 of both feet    Assessment:     Mycotic nail infection 1-5 both feet with pain    Plan:     Debride painful nailbeds 1-5 both feet with no iatrogenic bleeding noted      

## 2014-09-08 ENCOUNTER — Encounter (HOSPITAL_COMMUNITY): Payer: Self-pay | Admitting: Pharmacy Technician

## 2014-09-11 ENCOUNTER — Other Ambulatory Visit (HOSPITAL_COMMUNITY): Payer: Self-pay | Admitting: Orthopedic Surgery

## 2014-09-12 ENCOUNTER — Encounter (HOSPITAL_COMMUNITY): Payer: Self-pay

## 2014-09-12 ENCOUNTER — Encounter (HOSPITAL_COMMUNITY)
Admission: RE | Admit: 2014-09-12 | Discharge: 2014-09-12 | Disposition: A | Discharge status: Home / Self Care | Payer: Medicare Other | Source: Ambulatory Visit | Attending: Orthopedic Surgery | Admitting: Orthopedic Surgery

## 2014-09-12 ENCOUNTER — Encounter (HOSPITAL_COMMUNITY)
Admission: RE | Admit: 2014-09-12 | Discharge: 2014-09-12 | Disposition: A | Discharge status: Home / Self Care | Payer: Medicare Other | Source: Ambulatory Visit | Attending: Anesthesiology | Admitting: Anesthesiology

## 2014-09-12 DIAGNOSIS — Z0000 Encounter for general adult medical examination without abnormal findings: Secondary | ICD-10-CM | POA: Insufficient documentation

## 2014-09-12 DIAGNOSIS — Y9389 Activity, other specified: Secondary | ICD-10-CM | POA: Insufficient documentation

## 2014-09-12 DIAGNOSIS — M1990 Unspecified osteoarthritis, unspecified site: Secondary | ICD-10-CM | POA: Insufficient documentation

## 2014-09-12 DIAGNOSIS — S8252XA Displaced fracture of medial malleolus of left tibia, initial encounter for closed fracture: Secondary | ICD-10-CM | POA: Insufficient documentation

## 2014-09-12 DIAGNOSIS — X58XXXA Exposure to other specified factors, initial encounter: Secondary | ICD-10-CM | POA: Diagnosis not present

## 2014-09-12 DIAGNOSIS — Y9289 Other specified places as the place of occurrence of the external cause: Secondary | ICD-10-CM | POA: Diagnosis not present

## 2014-09-12 DIAGNOSIS — I129 Hypertensive chronic kidney disease with stage 1 through stage 4 chronic kidney disease, or unspecified chronic kidney disease: Secondary | ICD-10-CM | POA: Insufficient documentation

## 2014-09-12 DIAGNOSIS — Y998 Other external cause status: Secondary | ICD-10-CM | POA: Insufficient documentation

## 2014-09-12 DIAGNOSIS — R0602 Shortness of breath: Secondary | ICD-10-CM | POA: Diagnosis not present

## 2014-09-12 DIAGNOSIS — E119 Type 2 diabetes mellitus without complications: Secondary | ICD-10-CM | POA: Diagnosis not present

## 2014-09-12 DIAGNOSIS — F329 Major depressive disorder, single episode, unspecified: Secondary | ICD-10-CM | POA: Diagnosis not present

## 2014-09-12 DIAGNOSIS — I10 Essential (primary) hypertension: Secondary | ICD-10-CM

## 2014-09-12 DIAGNOSIS — K219 Gastro-esophageal reflux disease without esophagitis: Secondary | ICD-10-CM | POA: Diagnosis not present

## 2014-09-12 DIAGNOSIS — Z87891 Personal history of nicotine dependence: Secondary | ICD-10-CM | POA: Diagnosis not present

## 2014-09-12 DIAGNOSIS — N189 Chronic kidney disease, unspecified: Secondary | ICD-10-CM | POA: Insufficient documentation

## 2014-09-12 LAB — COMPREHENSIVE METABOLIC PANEL
ALK PHOS: 44 U/L (ref 39–117)
ALT: 20 U/L (ref 0–53)
AST: 23 U/L (ref 0–37)
Albumin: 3.9 g/dL (ref 3.5–5.2)
Anion gap: 18 — ABNORMAL HIGH (ref 5–15)
BUN: 34 mg/dL — AB (ref 6–23)
CO2: 24 mEq/L (ref 19–32)
Calcium: 9.5 mg/dL (ref 8.4–10.5)
Chloride: 98 mEq/L (ref 96–112)
Creatinine, Ser: 1.79 mg/dL — ABNORMAL HIGH (ref 0.50–1.35)
GFR calc Af Amer: 42 mL/min — ABNORMAL LOW (ref >90)
GFR calc non Af Amer: 36 mL/min — ABNORMAL LOW (ref >90)
Glucose, Bld: 101 mg/dL — ABNORMAL HIGH (ref 70–99)
POTASSIUM: 4 meq/L (ref 3.7–5.3)
SODIUM: 140 meq/L (ref 137–147)
TOTAL PROTEIN: 8 g/dL (ref 6.0–8.3)
Total Bilirubin: 0.4 mg/dL (ref 0.3–1.2)

## 2014-09-12 LAB — CBC
HCT: 36.8 % — ABNORMAL LOW (ref 39.0–52.0)
Hemoglobin: 12.1 g/dL — ABNORMAL LOW (ref 13.0–17.0)
MCH: 26 pg (ref 26.0–34.0)
MCHC: 32.9 g/dL (ref 30.0–36.0)
MCV: 79.1 fL (ref 78.0–100.0)
Platelets: 164 10*3/uL (ref 150–400)
RBC: 4.65 MIL/uL (ref 4.22–5.81)
RDW: 14.4 % (ref 11.5–15.5)
WBC: 5.3 10*3/uL (ref 4.0–10.5)

## 2014-09-12 LAB — APTT: APTT: 32 s (ref 24–37)

## 2014-09-12 LAB — PROTIME-INR
INR: 1.12 (ref 0.00–1.49)
Prothrombin Time: 14.4 seconds (ref 11.6–15.2)

## 2014-09-12 MED ORDER — DEXTROSE 5 % IV SOLN
3.0000 g | INTRAVENOUS | Status: AC
  Administered 2014-09-13: 3 g via INTRAVENOUS
  Filled 2014-09-12: qty 3000

## 2014-09-12 NOTE — Progress Notes (Signed)
Anesthesia Chart Review:  Patient is a 73 year old male scheduled for ORIF left ankle medial malleolus fracture on 09/13/14 by Dr. Sharol Given. PAT was 09/12/14. OR room is booked for 65 minutes.   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 BMI is consistent with morbid obesity.  PCP is Dr. Virgina Jock. Cardiologist is Dr. Claiborne Billings, last visit with Lyda Jester, PA-C on 07/11/14 for further evaluation of atypical chest pain, and had a non-ischemic stress test.    Meds include Byetta, Norco, Krill Oil, MVI, ASA 81 mg, Coreg, Plendil, fluticasone, Lasix, lisinopril, Claritin, metformin, KCL, Zocor, Maxide-25.  EKG on 07/11/14 showed: SR with first degree AVB.   Nuclear stress test on 07/19/14 showed: Overall Impression: Low risk stress nuclear study with small apical attenuation artifact. No reversible ischemia. LV Wall Motion: NL LV Function; NL Wall Motion; EF 53%.   Echo on 12/07/12 showed:  - Left ventricle: The cavity size was mildly dilated. There was moderate concentric hypertrophy. Systolic function was normal. The estimated ejection fraction was in the range of 50% to 55%. Regional wall motion abnormalities cannot be excluded. Doppler parameters are consistent with abnormal left ventricular relaxation (grade 1 diastolic dysfunction). - Ascending aorta: The ascending aorta was mildly dilated. Aortic root 45 mm. - Aortic valve: Trileaflet; mildly thickened, mildly calcified leaflets. Sclerosis without stenosis. - Mitral valve: Valve area by pressure half-time: 2.22cm^2. Trivial regurgitation. - Left atrium: The atrium was severely dilated. - Right ventricle: The cavity size was moderately dilated. Wall thickness was normal. Systolic function was mildly reduced. - Right atrium: The atrium was mildly dilated. -  Atrial septum: No defect or patent foramen ovale was identified.  CXR on 09/12/14 showed no active cardiopulmonary disease.  Preoperative labs noted.  BUN/Cr 34/1.79.  This afternoon I received the most recent labs and office note from Dr. Virgina Jock.  According to his 08/04/14 CPE, patient has known CKD stage III with most recent Cr of 1.4 on 02/06/14 and 1.3 on 07/28/14.  Because his renal function is above his baseline, I'll repeat an ISTAT8 on arrival tomorrow to ensure no significant further increase in his Creatinine.  His ACEI, Lasix,Maxzide, and metformin will be on hold tomorrow.     He had a recent non-ischemic stress test with stable low normal EF.  He has known CKD stage III but his Cr is further elevated since earlier this year. Same day labs will be reviewed and patient evaluated by his assigned anesthesiologist on the day of surgery to ensure no new CV/CHF symptoms and that his renal function is stable/improved in order to proceed with surgery.    George Hugh [facility] Short Stay Center/Anesthesiology Phone [phone] 09/12/2014 2:44 PM

## 2014-09-12 NOTE — Pre-Procedure Instructions (Signed)
Bradley Alvarez  09/12/2014   Your procedure is scheduled on: Wednesday, September 13, 2014  Report to [facility] Admitting at 8:00 AM.  Call this number if you have problems the morning of surgery: [phone]   Remember:   Do not eat food or drink liquids after midnight.   Take these medicines the morning of surgery with A SIP OF WATER: carvedilol (COREG), felodipine (PLENDIL), loratadine (CLARITIN), if needed:HYDROcodone for pain  Do not wear jewelry, make-up or nail polish.  Do not wear lotions, powders, or perfumes. You may not wear deodorant.  Do not shave 48 hours prior to surgery. Men may shave face and neck.  Do not bring valuables to the hospital.  [facility] is not responsible for any belongings or valuables.               Contacts, dentures or bridgework may not be worn into surgery.  Leave suitcase in the car. After surgery it may be brought to your room.  For patients admitted to the hospital, discharge time is determined by your treatment team.               Patients discharged the day of surgery will not be allowed to drive home.  Name and phone number of your driver  Special Instructions:  Special Instructions:Special Instructions: [facility] - Preparing for Surgery  Before surgery, you can play an important role.  Because skin is not sterile, your skin needs to be as free of germs as possible.  You can reduce the number of germs on you skin by washing with CHG (chlorahexidine gluconate) soap before surgery.  CHG is an antiseptic cleaner which kills germs and bonds with the skin to continue killing germs even after washing.  Please DO NOT use if you have an allergy to CHG or antibacterial soaps.  If your skin becomes reddened/irritated stop using the CHG and inform your nurse when you arrive at Short Stay.  Do not shave (including legs and underarms) for at least 48 hours prior to the first CHG shower.  You may shave your face.  Please follow these  instructions carefully:   1.  Shower with CHG Soap the night before surgery and the morning of Surgery.  2.  If you choose to wash your hair, wash your hair first as usual with your normal shampoo.  3.  After you shampoo, rinse your hair and body thoroughly to remove the Shampoo.  4.  Use CHG as you would any other liquid soap.  You can apply chg directly  to the skin and wash gently with scrungie or a clean washcloth.  5.  Apply the CHG Soap to your body ONLY FROM THE NECK DOWN.  Do not use on open wounds or open sores.  Avoid contact with your eyes, ears, mouth and genitals (private parts).  Wash genitals (private parts) with your normal soap.  6.  Wash thoroughly, paying special attention to the area where your surgery will be performed.  7.  Thoroughly rinse your body with warm water from the neck down.  8.  DO NOT shower/wash with your normal soap after using and rinsing off the CHG Soap.  9.  Pat yourself dry with a clean towel.            10.  Wear clean pajamas.            11.  Place clean sheets on your bed the night of your first shower  and do not sleep with pets.  Day of Surgery  Do not apply any lotions/deodorants the morning of surgery.  Please wear clean clothes to the hospital/surgery center.   Please read over the following fact sheets that you were given: Pain Booklet, Coughing and Deep Breathing and Surgical Site Infection Prevention

## 2014-09-13 ENCOUNTER — Encounter (HOSPITAL_COMMUNITY): Payer: 59 | Admitting: Vascular Surgery

## 2014-09-13 ENCOUNTER — Ambulatory Visit (HOSPITAL_COMMUNITY): Payer: 59 | Admitting: Anesthesiology

## 2014-09-13 ENCOUNTER — Ambulatory Visit (HOSPITAL_COMMUNITY)
Admission: RE | Admit: 2014-09-13 | Discharge: 2014-09-13 | Disposition: A | Discharge status: Home / Self Care | Payer: 59 | Source: Ambulatory Visit | Attending: Orthopedic Surgery | Admitting: Orthopedic Surgery

## 2014-09-13 ENCOUNTER — Encounter (HOSPITAL_COMMUNITY): Payer: Self-pay | Admitting: Certified Registered Nurse Anesthetist

## 2014-09-13 ENCOUNTER — Encounter (HOSPITAL_COMMUNITY)
Admission: RE | Disposition: A | Discharge status: Home / Self Care | Payer: Self-pay | Source: Ambulatory Visit | Attending: Orthopedic Surgery

## 2014-09-13 DIAGNOSIS — G4733 Obstructive sleep apnea (adult) (pediatric): Secondary | ICD-10-CM | POA: Insufficient documentation

## 2014-09-13 DIAGNOSIS — S8253XA Displaced fracture of medial malleolus of unspecified tibia, initial encounter for closed fracture: Secondary | ICD-10-CM | POA: Diagnosis present

## 2014-09-13 DIAGNOSIS — S8252XK Displaced fracture of medial malleolus of left tibia, subsequent encounter for closed fracture with nonunion: Secondary | ICD-10-CM | POA: Insufficient documentation

## 2014-09-13 DIAGNOSIS — E780 Pure hypercholesterolemia: Secondary | ICD-10-CM | POA: Diagnosis not present

## 2014-09-13 DIAGNOSIS — X58XXXD Exposure to other specified factors, subsequent encounter: Secondary | ICD-10-CM | POA: Diagnosis not present

## 2014-09-13 DIAGNOSIS — I10 Essential (primary) hypertension: Secondary | ICD-10-CM | POA: Insufficient documentation

## 2014-09-13 DIAGNOSIS — I350 Nonrheumatic aortic (valve) stenosis: Secondary | ICD-10-CM | POA: Insufficient documentation

## 2014-09-13 DIAGNOSIS — E1140 Type 2 diabetes mellitus with diabetic neuropathy, unspecified: Secondary | ICD-10-CM | POA: Diagnosis not present

## 2014-09-13 DIAGNOSIS — K219 Gastro-esophageal reflux disease without esophagitis: Secondary | ICD-10-CM | POA: Insufficient documentation

## 2014-09-13 DIAGNOSIS — F329 Major depressive disorder, single episode, unspecified: Secondary | ICD-10-CM | POA: Insufficient documentation

## 2014-09-13 DIAGNOSIS — Z6841 Body Mass Index (BMI) 40.0 and over, adult: Secondary | ICD-10-CM | POA: Insufficient documentation

## 2014-09-13 DIAGNOSIS — N400 Enlarged prostate without lower urinary tract symptoms: Secondary | ICD-10-CM | POA: Diagnosis not present

## 2014-09-13 DIAGNOSIS — S8252XS Displaced fracture of medial malleolus of left tibia, sequela: Secondary | ICD-10-CM

## 2014-09-13 HISTORY — PX: ORIF ANKLE FRACTURE: SHX5408

## 2014-09-13 LAB — POCT I-STAT, CHEM 8
BUN: 33 mg/dL — ABNORMAL HIGH (ref 6–23)
Calcium, Ion: 0.99 mmol/L — ABNORMAL LOW (ref 1.13–1.30)
Chloride: 107 mEq/L (ref 96–112)
Creatinine, Ser: 2 mg/dL — ABNORMAL HIGH (ref 0.50–1.35)
Glucose, Bld: 137 mg/dL — ABNORMAL HIGH (ref 70–99)
HCT: 38 % — ABNORMAL LOW (ref 39.0–52.0)
Hemoglobin: 12.9 g/dL — ABNORMAL LOW (ref 13.0–17.0)
Potassium: 3.6 mEq/L — ABNORMAL LOW (ref 3.7–5.3)
Sodium: 140 mEq/L (ref 137–147)
TCO2: 22 mmol/L (ref 0–100)

## 2014-09-13 LAB — GLUCOSE, CAPILLARY
GLUCOSE-CAPILLARY: 125 mg/dL — AB (ref 70–99)
Glucose-Capillary: 128 mg/dL — ABNORMAL HIGH (ref 70–99)

## 2014-09-13 SURGERY — OPEN REDUCTION INTERNAL FIXATION (ORIF) ANKLE FRACTURE
Anesthesia: Monitor Anesthesia Care | Site: Ankle | Laterality: Left

## 2014-09-13 MED ORDER — PROPOFOL 10 MG/ML IV BOLUS
INTRAVENOUS | Status: DC | PRN
  Administered 2014-09-13: 150 mg via INTRAVENOUS
  Administered 2014-09-13: 50 mg via INTRAVENOUS
  Administered 2014-09-13: 30 mg via INTRAVENOUS

## 2014-09-13 MED ORDER — FENTANYL CITRATE 0.05 MG/ML IJ SOLN
INTRAMUSCULAR | Status: AC
  Filled 2014-09-13: qty 5

## 2014-09-13 MED ORDER — PHENYLEPHRINE HCL 10 MG/ML IJ SOLN
10.0000 mg | INTRAVENOUS | Status: DC | PRN
  Administered 2014-09-13: 20 ug/min via INTRAVENOUS

## 2014-09-13 MED ORDER — ROPIVACAINE HCL 5 MG/ML IJ SOLN
INTRAMUSCULAR | Status: DC | PRN
  Administered 2014-09-13: 15 mL via PERINEURAL
  Administered 2014-09-13: 30 mL via PERINEURAL

## 2014-09-13 MED ORDER — SODIUM CHLORIDE 0.9 % IJ SOLN
INTRAMUSCULAR | Status: AC
  Filled 2014-09-13: qty 30

## 2014-09-13 MED ORDER — OXYCODONE-ACETAMINOPHEN 5-325 MG PO TABS: 1.0000 | ORAL_TABLET | ORAL | Status: DC | PRN

## 2014-09-13 MED ORDER — PROPOFOL 10 MG/ML IV BOLUS
INTRAVENOUS | Status: AC
  Filled 2014-09-13: qty 20

## 2014-09-13 MED ORDER — EPHEDRINE SULFATE 50 MG/ML IJ SOLN
INTRAMUSCULAR | Status: DC | PRN
  Administered 2014-09-13 (×2): 10 mg via INTRAVENOUS
  Administered 2014-09-13 (×2): 5 mg via INTRAVENOUS

## 2014-09-13 MED ORDER — MIDAZOLAM HCL 2 MG/2ML IJ SOLN
INTRAMUSCULAR | Status: AC
Start: 2014-09-13 — End: 2014-09-13
  Administered 2014-09-13: 1 mg
  Filled 2014-09-13: qty 2

## 2014-09-13 MED ORDER — GLYCOPYRROLATE 0.2 MG/ML IJ SOLN
INTRAMUSCULAR | Status: AC
  Filled 2014-09-13: qty 1

## 2014-09-13 MED ORDER — LIDOCAINE HCL (PF) 1 % IJ SOLN
INTRAMUSCULAR | Status: DC | PRN
  Administered 2014-09-13: 10 mL

## 2014-09-13 MED ORDER — LIDOCAINE HCL (PF) 1 % IJ SOLN
INTRAMUSCULAR | Status: AC
  Filled 2014-09-13: qty 30

## 2014-09-13 MED ORDER — LACTATED RINGERS IV SOLN
INTRAVENOUS | Status: DC | PRN
  Administered 2014-09-13 (×2): via INTRAVENOUS

## 2014-09-13 MED ORDER — LACTATED RINGERS IV SOLN
INTRAVENOUS | Status: DC
Start: 2014-09-13 — End: 2014-09-13
  Administered 2014-09-13: 09:00:00 via INTRAVENOUS

## 2014-09-13 MED ORDER — ONDANSETRON HCL 4 MG/2ML IJ SOLN
INTRAMUSCULAR | Status: DC | PRN
  Administered 2014-09-13: 4 mg via INTRAVENOUS

## 2014-09-13 MED ORDER — LIDOCAINE HCL (CARDIAC) 20 MG/ML IV SOLN
INTRAVENOUS | Status: DC | PRN
  Administered 2014-09-13: 60 mg via INTRAVENOUS

## 2014-09-13 MED ORDER — FENTANYL CITRATE 0.05 MG/ML IJ SOLN
INTRAMUSCULAR | Status: AC
  Administered 2014-09-13: 100 ug
  Filled 2014-09-13: qty 2

## 2014-09-13 SURGICAL SUPPLY — 38 items
BANDAGE ESMARK 6X9 LF (GAUZE/BANDAGES/DRESSINGS) IMPLANT
BIT DRILL CANN 2.7X625 NONSTRL (BIT) ×2 IMPLANT
BNDG COHESIVE 4X5 TAN STRL (GAUZE/BANDAGES/DRESSINGS) ×2 IMPLANT
BNDG ESMARK 6X9 LF (GAUZE/BANDAGES/DRESSINGS)
COVER SURGICAL LIGHT HANDLE (MISCELLANEOUS) ×2 IMPLANT
CUFF TOURNIQUET SINGLE 34IN LL (TOURNIQUET CUFF) IMPLANT
CUFF TOURNIQUET SINGLE 44IN (TOURNIQUET CUFF) IMPLANT
DRAPE INCISE IOBAN 66X45 STRL (DRAPES) ×2 IMPLANT
DRAPE OEC MINIVIEW 54X84 (DRAPES) IMPLANT
DRAPE PROXIMA HALF (DRAPES) ×2 IMPLANT
DRAPE U-SHAPE 47X51 STRL (DRAPES) ×2 IMPLANT
DRSG ADAPTIC 3X8 NADH LF (GAUZE/BANDAGES/DRESSINGS) ×2 IMPLANT
DURAPREP 26ML APPLICATOR (WOUND CARE) ×2 IMPLANT
ELECT REM PT RETURN 9FT ADLT (ELECTROSURGICAL) ×2
ELECTRODE REM PT RTRN 9FT ADLT (ELECTROSURGICAL) ×1 IMPLANT
GAUZE SPONGE 4X4 12PLY STRL (GAUZE/BANDAGES/DRESSINGS) ×2 IMPLANT
GLOVE BIOGEL PI IND STRL 9 (GLOVE) ×1 IMPLANT
GLOVE BIOGEL PI INDICATOR 9 (GLOVE) ×1
GLOVE SURG ORTHO 9.0 STRL STRW (GLOVE) ×2 IMPLANT
GOWN STRL REUS W/ TWL XL LVL3 (GOWN DISPOSABLE) ×3 IMPLANT
GOWN STRL REUS W/TWL XL LVL3 (GOWN DISPOSABLE) ×3
GUIDEWIRE THREADED 150MM (WIRE) ×4 IMPLANT
KIT BASIN OR (CUSTOM PROCEDURE TRAY) ×2 IMPLANT
KIT ROOM TURNOVER OR (KITS) ×2 IMPLANT
MANIFOLD NEPTUNE II (INSTRUMENTS) ×2 IMPLANT
NS IRRIG 1000ML POUR BTL (IV SOLUTION) ×2 IMPLANT
PACK ORTHO EXTREMITY (CUSTOM PROCEDURE TRAY) ×2 IMPLANT
PAD ARMBOARD 7.5X6 YLW CONV (MISCELLANEOUS) ×4 IMPLANT
PADDING CAST COTTON 6X4 STRL (CAST SUPPLIES) ×2 IMPLANT
SCREW SHORT THREAD 4.0X46 (Screw) ×4 IMPLANT
SPONGE LAP 18X18 X RAY DECT (DISPOSABLE) ×2 IMPLANT
STAPLER VISISTAT 35W (STAPLE) IMPLANT
SUCTION FRAZIER TIP 10 FR DISP (SUCTIONS) ×2 IMPLANT
SUT ETHILON 2 0 PSLX (SUTURE) IMPLANT
SUT VIC AB 2-0 CTB1 (SUTURE) ×4 IMPLANT
TOWEL OR 17X24 6PK STRL BLUE (TOWEL DISPOSABLE) ×2 IMPLANT
TOWEL OR 17X26 10 PK STRL BLUE (TOWEL DISPOSABLE) ×2 IMPLANT
TUBE CONNECTING 12X1/4 (SUCTIONS) ×2 IMPLANT

## 2014-09-13 NOTE — Transfer of Care (Signed)
Immediate Anesthesia Transfer of Care Note  Patient: Bradley Alvarez  Procedure(s) Performed: Procedure(s): Open Reduction Internal Fixation Left Ankle Medial Malleolus (Left)  Patient Location: PACU  Anesthesia Type:General  Level of Consciousness: awake, alert  and oriented  Airway & Oxygen Therapy: Patient Spontanous Breathing and Patient connected to nasal cannula oxygen  Post-op Assessment: Report given to PACU RN and Post -op Vital signs reviewed and stable  Post vital signs: Reviewed and stable  Complications: No apparent anesthesia complications

## 2014-09-13 NOTE — Op Note (Signed)
09/13/2014  12:26 PM  PATIENT:  Bradley Alvarez    PRE-OPERATIVE DIAGNOSIS:  Non-union Medial Malleolar Fracture Left Ankle  POST-OPERATIVE DIAGNOSIS:  Same  PROCEDURE:  Open Reduction Internal Fixation Left Ankle Medial Malleolus  Take down of the nonunion. C-arm fluoroscopy.  SURGEON:  Newt Minion, MD  PHYSICIAN ASSISTANT:None ANESTHESIA:   General  PREOPERATIVE INDICATIONS:  Bradley Alvarez is a  73 y.o. male with a diagnosis of Non-union Medial Malleolar Fracture Left Ankle who failed conservative measures and elected for surgical management.    The risks benefits and alternatives were discussed with the patient preoperatively including but not limited to the risks of infection, bleeding, nerve injury, cardiopulmonary complications, the need for revision surgery, among others, and the patient was willing to proceed.  OPERATIVE IMPLANTS: 2 cannulated screws 4 mm in diameter 46 mm long  OPERATIVE FINDINGS: Nonunion medial malleolar fracture  OPERATIVE PROCEDURE: Patient is a [age] gentleman who presents with a nonunion painful medial malleolar fracture. Patient has failed prolonged conservative care and presents at this time for surgical intervention. Risks and benefits were discussed including infection neurovascular injury persistent pain need for additional surgery. Patient states he understands and wished to proceed at this time.  Patient was brought to the operating room and underwent a general anesthetic after a popliteal block. After adequate levels of anesthesia were obtained patient's left lower extremity was prepped using DuraPrep draped into a sterile field. A longitudinal incision was made over the medial malleolus. This was carried down to the periosteum. Subperiosteal dissection was used at the area of the fibrous union. An osteotome was used to open up this fibrous union. Curette was then used to completely free up all the fibrous tissue around the nonunion site. This  is irrigated with normal saline. The fracture was reduced stabilized with a towel clip and 2 K wires were then inserted across the fracture site. 24.0 cannulated screws were then used to compress the fracture site. C-arm fluoroscopy verified the compression and alignment. The wound was again irrigated with normal saline. The incision was closed using 2-0 nylon. The wound was covered with a sterile compressive dressing he was extubated taken to the PACU in stable condition.

## 2014-09-13 NOTE — Anesthesia Postprocedure Evaluation (Signed)
  Anesthesia Post-op Note  Patient: Bradley Alvarez  Procedure(s) Performed: Procedure(s): Open Reduction Internal Fixation Left Ankle Medial Malleolus (Left)  Patient Location: PACU  Anesthesia Type:General and Regional  Level of Consciousness: awake and alert   Airway and Oxygen Therapy: Patient Spontanous Breathing  Post-op Pain: mild  Post-op Assessment: Post-op Vital signs reviewed, Patient's Cardiovascular Status Stable, Respiratory Function Stable, Patent Airway, No signs of Nausea or vomiting and Pain level controlled  Post-op Vital Signs: Reviewed and stable  Last Vitals:  Filed Vitals:   09/13/14 1323  BP: 154/82  Pulse: 56  Temp:   Resp: 20    Complications: No apparent anesthesia complications

## 2014-09-13 NOTE — H&P (Signed)
Bradley Alvarez is an 73 y.o. male.   Chief Complaint: Nonunion right ankle medial malleolar fracture HPI: Patient is a 73 year old gentleman who is about 1 year status post medial malleolar fracture. Patient has had persistent pain and MRI scan shows a persistent nonunion.  Past Medical History  Diagnosis Date  . Diabetes mellitus type 2, uncontrolled, with complications   . Hypercholesteremia   . Benign prostate hyperplasia   . Morbid obesity with BMI of 45.0-49.9, adult   . Lichen planus   . Left knee DJD   . Shortness of breath     WITH EXERTION   . Sleep apnea, obstructive 10/14/08    PSA- [facility] AHI duuring total sleep was 61.8/hr and during REMsleep 0.0/hr RDI during total sleep was 96.1/hr. and during REM sleep at 0.0/hr. The average O2 sat range during REM and NREM was 94% The lowest O2 Sat during REM and NREM was 85%  Severe OSA /hypopnea syndrome. Notably events were worse in the supine position and during REM sleep.   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 Aortic valve sclerosis   . Hypertension 10/02/11    lexiscan myoview negative for ischemia EF 46%low risk scan.  ECHO 12/07/12 No change in previous echo.  . Hernia of abdominal wall   . Early cataracts, bilateral   . Heart murmur     as a young adult  . GERD (gastroesophageal reflux disease)   . Neuropathy     due to diabetes  . Depression     medication induced    Past Surgical History  Procedure Laterality Date  . 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 Suprapubic prostatectomy    . Knee arthroscopy      RT KNEE   . Total knee arthroplasty  12/20/2012    Procedure: TOTAL KNEE ARTHROPLASTY;  Surgeon: Lorn Junes, MD;  Location: [place];  Service: Orthopedics;  Laterality: Left;  left total knee arthroplasty  . Cardiac catheterization    . Multiple tooth extractions    . Colonoscopy w/ biopsies and polypectomy      Family History  Problem  Relation Age of Onset  . Hypertension    . Diabetes Mellitus II    . Stroke    . Heart disease     Social History:  reports that he quit smoking about 42 years ago. He has quit using smokeless tobacco. His smokeless tobacco use included Chew and Snuff. He reports that he drinks alcohol. He reports that he does not use illicit drugs.  Allergies: No Known Allergies  No prescriptions prior to admission    Results for orders placed during the hospital encounter of 09/12/14 (from the past 48 hour(s))  APTT     Status: None   Collection Time    09/12/14 10:09 AM      Result Value Ref Range   aPTT 32  24 - 37 seconds  CBC     Status: Abnormal   Collection Time    09/12/14 10:09 AM      Result Value Ref Range   WBC 5.3  4.0 - 10.5 K/uL   RBC 4.65  4.22 - 5.81 MIL/uL   Hemoglobin 12.1 (*) 13.0 - 17.0 g/dL   HCT 36.8 (*) 39.0 - 52.0 %   MCV 79.1  78.0 - 100.0 fL   MCH 26.0  26.0 - 34.0 pg   MCHC  32.9  30.0 - 36.0 g/dL   RDW 14.4  11.5 - 15.5 %   Platelets 164  150 - 400 K/uL  COMPREHENSIVE METABOLIC PANEL     Status: Abnormal   Collection Time    09/12/14 10:09 AM      Result Value Ref Range   Sodium 140  137 - 147 mEq/L   Potassium 4.0  3.7 - 5.3 mEq/L   Chloride 98  96 - 112 mEq/L   CO2 24  19 - 32 mEq/L   Glucose, Bld 101 (*) 70 - 99 mg/dL   BUN 34 (*) 6 - 23 mg/dL   Creatinine, Ser 1.79 (*) 0.50 - 1.35 mg/dL   Calcium 9.5  8.4 - 10.5 mg/dL   Total Protein 8.0  6.0 - 8.3 g/dL   Albumin 3.9  3.5 - 5.2 g/dL   AST 23  0 - 37 U/L   ALT 20  0 - 53 U/L   Alkaline Phosphatase 44  39 - 117 U/L   Total Bilirubin 0.4  0.3 - 1.2 mg/dL   GFR calc non Af Amer 36 (*) >90 mL/min   GFR calc Af Amer 42 (*) >90 mL/min   Comment: (NOTE)     The eGFR has been calculated using the CKD EPI equation.     This calculation has not been validated in all clinical situations.     eGFR's persistently <90 mL/min signify possible Chronic Kidney     Disease.   Anion gap 18 (*) 5 - 15  PROTIME-INR      Status: None   Collection Time    09/12/14 10:09 AM      Result Value Ref Range   Prothrombin Time 14.4  11.6 - 15.2 seconds   INR 1.12  0.00 - 1.49   Dg Chest 2 View  09/12/2014   CLINICAL DATA:  Preop ORIF left medial malleolar fracture  EXAM: CHEST  2 VIEW  COMPARISON:  12/16/2012  FINDINGS: There is stable elevation of the right diaphragm. There is no focal parenchymal opacity, pleural effusion, or pneumothorax. The heart and mediastinal contours are unremarkable.  The osseous structures are unremarkable.  IMPRESSION: No active cardiopulmonary disease.   Electronically Signed   By: Kathreen Devoid   On: 09/12/2014 11:57    Review of Systems  All other systems reviewed and are negative.   There were no vitals taken for this visit. Physical Exam  On examination patient has a palpable pulse. He is tender to palpation over the medial malleolus. MRI scan shows a nonunion of the medial malleolar fracture. Assessment/Plan Assessment: Nonunion right ankle medial malleolar fracture.  Plan: Will plan for take down of the nonunion open reduction internal fixation. Risks and benefits were discussed including infection neurovascular injury persistent pain need for additional surgery. Patient states he understands and wished to proceed at this time.  DUDA,MARCUS V 09/13/2014, 6:41 AM

## 2014-09-13 NOTE — Anesthesia Preprocedure Evaluation (Addendum)
Anesthesia Evaluation  Patient identified by MRN, date of birth, ID band Patient awake    Reviewed: Allergy & Precautions, H&P , NPO status , Patient's Chart, lab work & pertinent test results  History of Anesthesia Complications Negative for: history of anesthetic complications  Airway Mallampati: II TM Distance: >3 FB Neck ROM: Full    Dental  (+) Teeth Intact   Pulmonary shortness of breath and with exertion, sleep apnea and Continuous Positive Airway Pressure Ventilation , former smoker,  breath sounds clear to auscultation        Cardiovascular hypertension, Pt. on medications and Pt. on home beta blockers - angina- Past MI and - CHF - dysrhythmias Rhythm:Regular     Neuro/Psych PSYCHIATRIC DISORDERS Depression negative neurological ROS     GI/Hepatic Neg liver ROS, GERD-  Controlled,  Endo/Other  diabetes, Type 2  Renal/GU CRFRenal disease     Musculoskeletal  (+) Arthritis -, Osteoarthritis,  Left ankle fracture   Abdominal   Peds  Hematology negative hematology ROS (+)   Anesthesia Other Findings Patient with preop Cr 1.7-2, discussed with patient's primary care physician that was aware of the CRI and was following the patient, endorsed desire for adequate perioperative hydration and recommended patient proceed with scheduled surgery, discussed with patient and Dr Sharol Given  Reproductive/Obstetrics                        Anesthesia Physical Anesthesia Plan  ASA: III  Anesthesia Plan:    Post-op Pain Management:    Induction:   Airway Management Planned:   Additional Equipment:   Intra-op Plan:   Post-operative Plan:   Informed Consent:   Plan Discussed with:   Anesthesia Plan Comments:         Anesthesia Quick Evaluation

## 2014-09-13 NOTE — Discharge Instructions (Signed)
Ice elevation left ankle. Minimize weightbearing left foot. Use fracture boot for ambulation.

## 2014-09-13 NOTE — Anesthesia Procedure Notes (Addendum)
Procedure Name: LMA Insertion Date/Time: 09/13/2014 12:09 PM Performed by: Garner Nash Pre-anesthesia Checklist: Patient identified, Emergency Drugs available, Suction available, Patient being monitored and Timeout performed Patient Re-evaluated:Patient Re-evaluated prior to inductionOxygen Delivery Method: Circle system utilized Preoxygenation: Pre-oxygenation with 100% oxygen Intubation Type: IV induction Ventilation: Mask ventilation without difficulty LMA: LMA inserted LMA Size: 5.0 Number of attempts: 1 Dental Injury: Teeth and Oropharynx as per pre-operative assessment     Anesthesia Regional Block:  Popliteal block  Pre-Anesthetic Checklist: ,, timeout performed, Correct Patient, Correct Site, Correct Laterality, Correct Procedure, Correct Position, site marked, Risks and benefits discussed,  Surgical consent,  Pre-op evaluation,  At surgeon's request and post-op pain management  Laterality: Lower and Left  Prep: chloraprep       Needles:  Injection technique: Single-shot  Needle Type: Echogenic Needle          Additional Needles:  Procedures: ultrasound guided (picture in chart) Popliteal block  Nerve Stimulator or Paresthesia:  Response: plantarflexion, 0.5 mA,   Additional Responses:   Narrative:  Start time: 09/13/2014 11:02 AM End time: 09/13/2014 11:23 AM Injection made incrementally with aspirations every 5 mL.  Performed by: Personally  Anesthesiologist: Cyril Railey  Additional Notes: H+P and labs reviewed, risks and benefits discussed with patient, procedure tolerated well without complications. Additional placement of 76ml 0.5% Ropiv along saphenous nerve just above the knee under US guidance without paraesthesia and neg aspiration every 5 ml

## 2014-09-16 ENCOUNTER — Encounter (HOSPITAL_COMMUNITY): Payer: Self-pay | Admitting: Orthopedic Surgery

## 2014-10-09 ENCOUNTER — Other Ambulatory Visit: Payer: Self-pay | Admitting: Internal Medicine

## 2014-10-09 DIAGNOSIS — N183 Chronic kidney disease, stage 3 unspecified: Secondary | ICD-10-CM

## 2014-10-16 ENCOUNTER — Ambulatory Visit
Admission: RE | Admit: 2014-10-16 | Discharge: 2014-10-16 | Disposition: A | Discharge status: Home / Self Care | Payer: Medicare Other | Source: Ambulatory Visit | Attending: Internal Medicine | Admitting: Internal Medicine

## 2014-10-16 DIAGNOSIS — N183 Chronic kidney disease, stage 3 unspecified: Secondary | ICD-10-CM

## 2014-10-17 ENCOUNTER — Ambulatory Visit (INDEPENDENT_AMBULATORY_CARE_PROVIDER_SITE_OTHER): Payer: Medicare Other | Admitting: Cardiovascular Disease

## 2014-10-17 ENCOUNTER — Encounter: Payer: Self-pay | Admitting: Cardiovascular Disease

## 2014-10-17 VITALS — BP 176/74 | HR 62 | Ht 74.0 in | Wt 330.0 lb

## 2014-10-17 DIAGNOSIS — Z6841 Body Mass Index (BMI) 40.0 and over, adult: Secondary | ICD-10-CM

## 2014-10-17 DIAGNOSIS — G4733 Obstructive sleep apnea (adult) (pediatric): Secondary | ICD-10-CM

## 2014-10-17 DIAGNOSIS — I10 Essential (primary) hypertension: Secondary | ICD-10-CM

## 2014-10-17 DIAGNOSIS — R600 Localized edema: Secondary | ICD-10-CM

## 2014-10-17 NOTE — Progress Notes (Signed)
Patient ID: Bradley Alvarez, male   DOB: 20-Feb-1941, 73 y.o.   MRN: [id number]       HPI: Bradley Alvarez is a 73 y.o. African-American male who presents to the office for a follow-up cardiology and sleep evaluation.  Bradley Alvarez has a history of hypertension, type 2 diabetes mellitus, mixed hyperlipidemia, severe sleep apnea with CPAP therapy, morbid obesity, and documented grade 1 diastolic dysfunction.  He underwent total knee replacement surgery on 12/20/2012 without cardiovascular complications. He did require antibiotics postoperatively.  Since I last saw him he underwent a nuclear perfusion study in August 2015 which showed small apical area of attenuation without ischemia.  Ejection fraction was 53%.  He had 17 teeth pulled in September and underwent left ankle operation in October 2015.  In the past he has had issues  with shortness of breath and leg swelling.  Presently, he has been on carvedilol 25 mg twice a day, Plendil 10 mg daily.  Furosemide 40 mg as needed and lisinopril 40 mg daily for blood pressure control.  He has been on metformin 1000 g twice a day in addition to [place] for his diabetes mellitus.  Has been taking simvastatin 40 mg for hyperlipidemia.  He denies any recent chest pain.  His shortness of breath has improved.  He had lost some weight but has gained some weight back.  With reference to his obstructive sleep apnea, he now has a new Resmed AirSense 10 AutoSet CPAP unit and is set at a 5 cm and a maximum pressure of 20 cm.  His mask is a [employer] full.  Face mask large.  I did obtain a download from 03/05/2014 through 04/03/2014.  AHI is excellent at 1.4, with an apnea index of only 0.6.  His 95th percentile.  Pressure was 9.9.  He did not have any central apnea events.  He is 100% compliant, averaging 9 hours and 11 minutes of usage daily.  He denies significant awareness of daytime sleepiness.  He is unaware of breakthrough snoring.   Past Medical History    Diagnosis Date  . Diabetes mellitus type 2, uncontrolled, with complications   . Hypercholesteremia   . Benign prostate hyperplasia   . Morbid obesity with BMI of 45.0-49.9, adult   . Lichen planus   . Left knee DJD   . Shortness of breath     WITH EXERTION   . Sleep apnea, obstructive 10/14/08    PSA- [facility] AHI duuring total sleep was 61.8/hr and during REMsleep 0.0/hr RDI during total sleep was 96.1/hr. and during REM sleep at 0.0/hr. The average O2 sat range during REM and NREM was 94% The lowest O2 Sat during REM and NREM was 85%  Severe OSA /hypopnea syndrome. Notably events were worse in the supine position and during REM sleep.   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 Aortic valve sclerosis   . Hypertension 10/02/11    lexiscan myoview negative for ischemia EF 46%low risk scan.  ECHO 12/07/12 No change in previous echo.  . Hernia of abdominal wall   . Early cataracts, bilateral   . Heart murmur     as a young adult  . GERD (gastroesophageal reflux disease)   . Neuropathy     due to diabetes  . Depression     medication induced    Past Surgical History  Procedure Laterality Date  . Total knee arthroplasty  08/27/2009  right knee  . Suprapubic prostatectomy    . Knee arthroscopy      RT KNEE   . Total knee arthroplasty  12/20/2012    Procedure: TOTAL KNEE ARTHROPLASTY;  Surgeon: Lorn Junes, MD;  Location: [facility];  Service: Orthopedics;  Laterality: Left;  left total knee arthroplasty  . Cardiac catheterization    . Multiple tooth extractions    . Colonoscopy w/ biopsies and polypectomy    . Orif ankle fracture Left 09/13/2014    Procedure: Open Reduction Internal Fixation Left Ankle Medial Malleolus;  Surgeon: Newt Minion, MD;  Location: [place];  Service: Orthopedics;  Laterality: Left;    No Known Allergies  Current Outpatient Prescriptions  Medication Sig Dispense Refill  . amoxicillin (AMOXIL) 500 MG  capsule   0  . aspirin 81 MG tablet Take 81 mg by mouth daily.    Marland Kitchen BYETTA 10 MCG PEN 10 MCG/0.04ML SOPN injection Inject 10 mcg into the skin 2 (two) times daily.    . carvedilol (COREG) 25 MG tablet Take 25 mg by mouth 2 (two) times daily with a meal.    . chlorhexidine (PERIDEX) 0.12 % solution   12  . clobetasol ointment (TEMOVATE) 4.62 % Apply 1 application topically once a week.    . docusate sodium 100 MG CAPS 1 tab 2 times a day while on narcotics.  STOOL SOFTENER 60 capsule 0  . felodipine (PLENDIL) 5 MG 24 hr tablet Take 10 mg by mouth daily.    . fluticasone (CUTIVATE) 0.005 % ointment Apply 1 application topically 2 (two) times daily.    . furosemide (LASIX) 40 MG tablet Take 40 mg by mouth as needed (for swelling).     Marland Kitchen HYDROcodone-acetaminophen (NORCO/VICODIN) 5-325 MG per tablet Take 1 tablet by mouth every 12 (twelve) hours as needed for moderate pain.     Marland Kitchen ketoconazole (NIZORAL) 2 % shampoo Apply 1 application topically every 30 (thirty) days.    Astrid Drafts Omega-3 300 MG CAPS Take 1 capsule by mouth daily.    Marland Kitchen lisinopril (PRINIVIL,ZESTRIL) 40 MG tablet Take 40 mg by mouth daily.     Marland Kitchen loratadine (CLARITIN) 10 MG tablet Take 20 mg by mouth every morning.    . metFORMIN (GLUCOPHAGE) 1000 MG tablet Take 1,000 mg by mouth 2 (two) times daily with a meal.    . Multiple Vitamins-Minerals ([employer] SPECTRAVITE SENIOR PO) Take 1 tablet by mouth daily.     . potassium chloride SA (K-DUR,KLOR-CON) 20 MEQ tablet Take 20 mEq by mouth daily.     . simvastatin (ZOCOR) 40 MG tablet Take 40 mg by mouth every evening.    . urea (CARMOL) 10 % cream Apply 1 application topically once a week.     No current facility-administered medications for this visit.    Socially he is married and has 5 children. He is not able to exercise routinely. He has remote tobacco history. There is no alcohol. He states he did lose more weight but has regained some weight back.  ROS General: Negative; No fevers,  chills, or night sweats;  HEENT: Negative; No changes in vision or hearing, sinus congestion, difficulty swallowing Pulmonary: Negative; No cough, wheezing, shortness of breath, hemoptysis Cardiovascular: Negative; No chest pain, presyncope, syncope, palpitations GI: Negative; No nausea, vomiting, diarrhea, or abdominal pain GU: Negative; No dysuria, hematuria, or difficulty voiding Musculoskeletal: Negative; no myalgias, joint pain, or weakness Hematologic/Oncology: Negative; no easy bruising, bleeding Endocrine: Negative; no heat/cold intolerance;  no diabetes Neuro: Negative; no changes in balance, headaches Skin: Negative; No rashes or skin lesions Psychiatric: Negative; No behavioral problems, depression Sleep: Positive for obstructive sleep apnea.  He cannot sleep without it.  He is unaware of breakthrough snoring No daytime sleepiness, hypersomnolence, bruxism, restless legs, hypnogognic hallucinations, no cataplexy Other comprehensive 14 point system review is negative.   PE BP 176/74 mmHg  Pulse 62  Ht 6\' 2"  (1.88 m)  Wt 330 lb (149.687 kg)  BMI 42.35 kg/m2  General: Alert, oriented, no distress. Morbidly obese. Skin: normal turgor, no rashes HEENT: Normocephalic, atraumatic. Pupils round and reactive; sclera anicteric;no lid lag.  Nose without nasal septal hypertrophy Mouth/Parynx benign; Mallinpatti scale 4 Neck: Very thick neck; No JVD, no carotid bruits; normal carotid upstroke Lungs: clear to ausculatation and percussion; no wheezing or rales Chest wall: Nontender to palpation Heart: RRR, s1 s2 normal 2-8/3 systolic murmur.  No S3 or S4 gallop heard.  No diastolic murmur, rubs, thrills or heaves Abdomen: Moderate central adiposity soft, nontender; no hepatosplenomehaly, BS+; abdominal aorta nontender and not dilated by palpation. Back: No CVA tenderness Pulses 2+ Extremities: Improved.  Previously edema, now, only minimal. ; no clubbing cyanosis, Homan's sign negative   Neurologic: grossly nonfocal Psychological: Normal affect and mood  ECG (independently read by me): Normal sinus rhythm at 62 bpm.  PR interval 190 ms QTc interval 454 ms.  Prior ECG (independently read by me): Normal sinus rhythm at 75 beats per minute. Borderline first degree AV block with a PR interval of 240 ms. QTC interval normal at 444 ms. No significant ST changes.   Prior ECG from 06/13/2013 : Sinus rhythm at 55 beats per minute. PR interval 202 ms. QTC interval 443 ms.  LABS:  BMET    Component Value Date/Time   NA 140 09/13/2014 0848   K 3.6* 09/13/2014 0848   CL 107 09/13/2014 0848   CO2 24 09/12/2014 1009   GLUCOSE 137* 09/13/2014 0848   BUN 33* 09/13/2014 0848   CREATININE 2.00* 09/13/2014 0848   CALCIUM 9.5 09/12/2014 1009   GFRNONAA 36* 09/12/2014 1009   GFRAA 42* 09/12/2014 1009     Hepatic Function Panel     Component Value Date/Time   PROT 8.0 09/12/2014 1009   ALBUMIN 3.9 09/12/2014 1009   AST 23 09/12/2014 1009   ALT 20 09/12/2014 1009   ALKPHOS 44 09/12/2014 1009   BILITOT 0.4 09/12/2014 1009     CBC    Component Value Date/Time   WBC 5.3 09/12/2014 1009   RBC 4.65 09/12/2014 1009   HGB 12.9* 09/13/2014 0848   HCT 38.0* 09/13/2014 0848   PLT 164 09/12/2014 1009   MCV 79.1 09/12/2014 1009   MCH 26.0 09/12/2014 1009   MCHC 32.9 09/12/2014 1009   RDW 14.4 09/12/2014 1009   LYMPHSABS 3.1 12/16/2012 1356   MONOABS 0.9 12/16/2012 1356   EOSABS 0.2 12/16/2012 1356   BASOSABS 0.0 12/16/2012 1356     BNP No results found for: PROBNP  Lipid Panel  No results found for: CHOL   RADIOLOGY: No results found.    ASSESSMENT AND PLAN: Mr. Phegley is a 73 year old African-American gentleman with morbid obesity who has significant cardiovascular comorbidities including hypertension with grade 1 diastolic dysfunction, type 2 diabetes mellitus, mixed hyperlipidemia, and severe sleep apnea with CPAP therapy. Since I last saw him, his weight has  increased and is now at 330 pounds, giving a body mass index of 42.35.  We again discussed  trying to get his weight down to ideally 300 if at all possible.  Very muscular and mesomorphic in appearance.  He's not having any chest pain or anginal symptoms.  His blood pressure today initially was significantly elevated at 176/74, but I'll repeat by me was 134/74 on his current medical regimen consisting of carvedilol 25 g twice a day, Plendil 510 mg, and furosemide 40 mg in addition to lisinopril 40 mg.  He tolerated his surgeries without cardiovascular compromise.  He had developed chest pain which most likely was musculoskeletal in etiology August 2015 and was referred for a subsequent nuclear perfusion study.  I reviewed his nuclear perfusion study with him in detail and this argues against ischemia and showed attenuation artifact, most likely contributed by his very large size.  Dr. Virgina Jock will be checking laboratory.  I discussed increased exercise.  He continues to use CPAP therapy with 100% compliance and notes marked improvement with his new machine.  As long as he remains stable I will see him in 6 months for cardiology reevaluation.  Time spent: 25 minutes   Troy Sine, MD, [facility]  10/17/2014 10:42 AM

## 2014-10-17 NOTE — Patient Instructions (Signed)
Your physician wants you to follow-up in: 6 months or sooner with Dr. Claiborne Billings.You will receive a reminder letter in the mail two months in advance. If you don't receive a letter, please call our office to schedule the follow-up appointment. No changes have been made i your therapy today.

## 2014-10-19 ENCOUNTER — Encounter: Payer: Self-pay | Admitting: Cardiovascular Disease

## 2014-10-25 ENCOUNTER — Other Ambulatory Visit: Payer: Self-pay | Admitting: Internal Medicine

## 2014-10-25 DIAGNOSIS — N183 Chronic kidney disease, stage 3 unspecified: Secondary | ICD-10-CM

## 2014-12-04 ENCOUNTER — Ambulatory Visit (INDEPENDENT_AMBULATORY_CARE_PROVIDER_SITE_OTHER): Payer: Medicare Other | Admitting: Podiatry

## 2014-12-04 DIAGNOSIS — B351 Tinea unguium: Secondary | ICD-10-CM

## 2014-12-04 DIAGNOSIS — M79673 Pain in unspecified foot: Secondary | ICD-10-CM

## 2014-12-04 NOTE — Progress Notes (Signed)
Subjective:     Patient ID: Bradley Alvarez, male   DOB: Jul 04, 1941, 74 y.o.   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

## 2015-03-05 ENCOUNTER — Ambulatory Visit (INDEPENDENT_AMBULATORY_CARE_PROVIDER_SITE_OTHER): Payer: Medicare Other | Admitting: Podiatry

## 2015-03-05 DIAGNOSIS — S90821A Blister (nonthermal), right foot, initial encounter: Secondary | ICD-10-CM | POA: Diagnosis not present

## 2015-03-05 DIAGNOSIS — B351 Tinea unguium: Secondary | ICD-10-CM

## 2015-03-05 DIAGNOSIS — L089 Local infection of the skin and subcutaneous tissue, unspecified: Secondary | ICD-10-CM

## 2015-03-05 DIAGNOSIS — M79673 Pain in unspecified foot: Secondary | ICD-10-CM | POA: Diagnosis not present

## 2015-03-05 NOTE — Progress Notes (Signed)
Subjective:     Patient ID: Bradley Alvarez, male   DOB: 1941/11/18, 74 y.o.   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

## 2015-03-19 ENCOUNTER — Ambulatory Visit (INDEPENDENT_AMBULATORY_CARE_PROVIDER_SITE_OTHER): Payer: Medicare Other | Admitting: Podiatry

## 2015-03-19 ENCOUNTER — Encounter: Payer: Self-pay | Admitting: Podiatry

## 2015-03-19 VITALS — BP 203/101 | HR 74 | Resp 18

## 2015-03-19 DIAGNOSIS — L089 Local infection of the skin and subcutaneous tissue, unspecified: Secondary | ICD-10-CM | POA: Diagnosis not present

## 2015-03-19 DIAGNOSIS — S90821A Blister (nonthermal), right foot, initial encounter: Secondary | ICD-10-CM | POA: Diagnosis not present

## 2015-03-20 NOTE — Progress Notes (Signed)
Subjective:     Patient ID: Bradley Alvarez, male   DOB: 1941-09-09, 74 y.o.   MRN: [id number]  HPI patient presents with a blister on the right first metatarsal and right ankle that he was concerned about and wanted to have evaluated   Review of Systems     Objective:   Physical Exam Long-term diabetic with moderate obesity who is noted to have blister on the right first metatarsal and right ankle that are healing with no proximal edema erythema or drainage note    Assessment:     Localized blistering which appears to be more friction and does not appear to be related to infection    Plan:     Applied Silvadene to the areas and advised on soaks and monitoring. If anything were to occur patient's to let us know

## 2015-04-24 ENCOUNTER — Encounter: Payer: Self-pay | Admitting: Cardiovascular Disease

## 2015-04-24 ENCOUNTER — Ambulatory Visit (INDEPENDENT_AMBULATORY_CARE_PROVIDER_SITE_OTHER): Payer: Medicare Other | Admitting: Cardiovascular Disease

## 2015-04-24 VITALS — BP 168/94 | HR 61 | Ht 74.0 in | Wt 318.6 lb

## 2015-04-24 DIAGNOSIS — I10 Essential (primary) hypertension: Secondary | ICD-10-CM

## 2015-04-24 DIAGNOSIS — IMO0002 Reserved for concepts with insufficient information to code with codable children: Secondary | ICD-10-CM

## 2015-04-24 DIAGNOSIS — Z6841 Body Mass Index (BMI) 40.0 and over, adult: Secondary | ICD-10-CM

## 2015-04-24 DIAGNOSIS — E118 Type 2 diabetes mellitus with unspecified complications: Secondary | ICD-10-CM

## 2015-04-24 DIAGNOSIS — Z79899 Other long term (current) drug therapy: Secondary | ICD-10-CM

## 2015-04-24 DIAGNOSIS — E1165 Type 2 diabetes mellitus with hyperglycemia: Secondary | ICD-10-CM

## 2015-04-24 DIAGNOSIS — R600 Localized edema: Secondary | ICD-10-CM

## 2015-04-24 DIAGNOSIS — E7800 Pure hypercholesterolemia, unspecified: Secondary | ICD-10-CM

## 2015-04-24 DIAGNOSIS — G4733 Obstructive sleep apnea (adult) (pediatric): Secondary | ICD-10-CM

## 2015-04-24 DIAGNOSIS — Z9989 Dependence on other enabling machines and devices: Secondary | ICD-10-CM

## 2015-04-24 MED ORDER — SPIRONOLACTONE 25 MG PO TABS: 25.0000 mg | ORAL_TABLET | Freq: Every day | ORAL | Status: DC

## 2015-04-24 NOTE — Patient Instructions (Signed)
Your physician has recommended you make the following change in your medication: stop the potassium as directed by Dr. Claiborne Billings. Start new prescription for spironalactone 25 mg. This has already been sent to your [facility] pharmacy.  Your physician recommends that you return for lab work in: 1 week. This does not need to be fasting.  Your physician recommends that you schedule a follow-up appointment in: 4-6 weeks.

## 2015-04-26 ENCOUNTER — Encounter: Payer: Self-pay | Admitting: Cardiovascular Disease

## 2015-04-26 DIAGNOSIS — Z9989 Dependence on other enabling machines and devices: Secondary | ICD-10-CM

## 2015-04-26 DIAGNOSIS — G4733 Obstructive sleep apnea (adult) (pediatric): Secondary | ICD-10-CM | POA: Insufficient documentation

## 2015-04-26 NOTE — Progress Notes (Signed)
Patient ID: Bradley Alvarez, male   DOB: 11-15-1941, 74 y.o.   MRN: [id number]     HPI: Bradley Alvarez is a 74 y.o. African-American male who presents to the office for a follow-up cardiology  evaluation.  Bradley Alvarez has a history of hypertension, type 2 diabetes mellitus, mixed hyperlipidemia, severe sleep apnea with Alvarez therapy, morbid obesity, and documented grade 1 diastolic dysfunction.  He underwent total knee replacement surgery on 12/20/2012 without cardiovascular complications. A nuclear perfusion study in August 2015  showed small apical area of attenuation without ischemia.  Ejection fraction was 53%.  He had 17 teeth pulled in September 2015 and underwent left ankle operation in October 2015.  In the past he has had issues  with shortness of breath and leg swelling.  Presently, he has been on carvedilol 25 mg twice a day, Plendil 10 mg daily, clonidine 0.1 mg twice a day, furosemide 20 mg as needed and losartan 100 daily for blood pressure control.  He has been on metformin 1000 g twice a day in addition to [place] for his diabetes mellitus.  Has been taking simvastatin 40 mg for hyperlipidemia..  With reference to his obstructive sleep apnea, he now has a new Bradley Alvarez unit and is set at a 5 cm and a maximum pressure of 20 cm.  His mask is a [employer] full.  Face mask large.  A download from 03/05/2014 through 04/03/2014:  AHI is excellent at 1.4, with an apnea index of only 0.6.  His 95th percentile pressure was 9.9.  He did not have any central apnea events.  He is 100% compliant, averaging 9 hours and 11 minutes of usage daily.  He denies significant awareness of daytime sleepiness.  He is unaware of breakthrough snoring.  Since I last saw him, he has continued to have difficulty with lower extremity edema.  I had reviewed.  Blood work that he had done at [facility] on 04/03/2015.  His potassium was low and he was subtotally given supplementation.   Lipid studies revealed a total cholesterol 131, triglycerides 97, HDL 42, LDL 77.  Hemoglobin A1c was 6.1.  TSH 1.66.  He had evidence for urinary microalbuminurea.  He denies chest pain.  He denies PND, orthopnea.   Past Medical History  Diagnosis Date  . Diabetes mellitus type 2, uncontrolled, with complications   . Hypercholesteremia   . Benign prostate hyperplasia   . Morbid obesity with BMI of 45.0-49.9, adult   . Lichen planus   . Left knee DJD   . Shortness of breath     WITH EXERTION   . Sleep apnea, obstructive 10/14/08    [facility]- [facility] AHI duuring total sleep was 61.8/hr and during REMsleep 0.0/hr RDI during total sleep was 96.1/hr. and during REM sleep at 0.0/hr. The average O2 sat range during REM and NREM was 94% The lowest O2 Sat during REM and NREM was 85%  Severe OSA /hypopnea syndrome. Notably events were worse in the supine position and during REM sleep.   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 Aortic valve sclerosis   . Hypertension 10/02/11    lexiscan myoview negative for ischemia EF 46%low risk scan.  ECHO 12/07/12 No change in previous echo.  . Hernia of abdominal wall   . Early cataracts, bilateral   . Heart murmur     as a young adult  . GERD (  gastroesophageal reflux disease)   . Neuropathy     due to diabetes  . Depression     medication induced    Past Surgical History  Procedure Laterality Date  . 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 Suprapubic prostatectomy    . Knee arthroscopy      RT KNEE   . 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 Cardiac catheterization    . Multiple tooth extractions    . Colonoscopy w/ biopsies and polypectomy    . Orif ankle fracture Left 09/13/2014    Procedure: Open Reduction Internal Fixation Left  Ankle Medial Malleolus;  Surgeon: Newt Minion, MD;  Location: [place];  Service: Orthopedics;  Laterality: Left;    No Known Allergies  Current Outpatient Prescriptions  Medication Sig Dispense Refill  . aspirin 81 MG tablet Take 81 mg by mouth daily.    . B-D UF III MINI PEN NEEDLES 31G X 5 MM MISC   3  . BYETTA 10 MCG PEN 10 MCG/0.04ML SOPN injection Inject 10 mcg into the skin 2 (two) times daily.    . carvedilol (COREG) 25 MG tablet Take 25 mg by mouth 2 (two) times daily with a meal.    . chlorhexidine (PERIDEX) 0.12 % solution   12  . clobetasol ointment (TEMOVATE) 6.62 % Apply 1 application topically once a week.    . cloNIDine (CATAPRES) 0.1 MG tablet Take 1 tablet by mouth 2 (two) times daily.    Marland Kitchen docusate sodium 100 MG CAPS 1 tab 2 times a day while on narcotics.  STOOL SOFTENER 60 capsule 0  . felodipine (PLENDIL) 5 MG 24 hr tablet Take 10 mg by mouth daily.    . fluticasone (CUTIVATE) 0.005 % ointment Apply 1 application topically 2 (two) times daily.    . furosemide (LASIX) 40 MG tablet Take 40 mg by mouth as needed (for swelling).     Marland Kitchen HYDROcodone-acetaminophen (NORCO/VICODIN) 5-325 MG per tablet Take 1 tablet by mouth every 12 (twelve) hours as needed for moderate pain.     Marland Kitchen ketoconazole (NIZORAL) 2 % shampoo Apply 1 application topically every 30 (thirty) days.    Astrid Drafts Omega-3 300 MG CAPS Take 1 capsule by mouth daily.    Marland Kitchen loratadine (CLARITIN) 10 MG tablet Take 20 mg by mouth every morning.    Marland Kitchen losartan (COZAAR) 100 MG tablet Take 1 tablet by mouth daily.    . metFORMIN (GLUCOPHAGE) 1000 MG tablet Take 1,000 mg by mouth 2 (two) times daily with a meal.    . Multiple Vitamins-Minerals ([employer] SPECTRAVITE SENIOR PO) Take 1 tablet by mouth daily.     . simvastatin (ZOCOR) 40 MG tablet Take 40 mg by mouth every evening.    . urea (CARMOL) 10 % cream Apply 1 application topically once a week.    . spironolactone (ALDACTONE) 25 MG tablet Take 1 tablet (25 mg total) by  mouth daily. 30 tablet 6   No current facility-administered medications for this visit.    Socially he is married and has 5 children. He is not able to exercise routinely. He has remote tobacco history. There is no alcohol. He states he did lose more weight but has regained some weight back.  ROS General: Negative; No fevers, chills, or night  sweats;  HEENT: Negative; No changes in vision or hearing, sinus congestion, difficulty swallowing Pulmonary: Negative; No cough, wheezing, shortness of breath, hemoptysis Cardiovascular: Negative; No chest pain, presyncope, syncope, palpitations GI: Negative; No nausea, vomiting, diarrhea, or abdominal pain GU: Negative; No dysuria, hematuria, or difficulty voiding Musculoskeletal: Negative; no myalgias, joint pain, or weakness Hematologic/Oncology: Negative; no easy bruising, bleeding Endocrine: Negative; no heat/cold intolerance; no diabetes Neuro: Negative; no changes in balance, headaches Skin: Negative; No rashes or skin lesions Psychiatric: Negative; No behavioral problems, depression Sleep: Positive for obstructive sleep apnea.  He cannot sleep without it.  He is unaware of breakthrough snoring No daytime sleepiness, hypersomnolence, bruxism, restless legs, hypnogognic hallucinations, no cataplexy Other comprehensive 14 point system review is negative.   PE BP 168/94 mmHg  Pulse 61  Ht '6\' 2"'  (1.88 m)  Wt 318 lb 9.6 oz (144.516 kg)  BMI 40.89 kg/m2   Wt Readings from Last 3 Encounters:  04/24/15 318 lb 9.6 oz (144.516 kg)  10/17/14 330 lb (149.687 kg)  09/13/14 331 lb (150.141 kg)   General: Alert, oriented, no distress. Morbidly obese. Skin: normal turgor, no rashes HEENT: Normocephalic, atraumatic. Pupils round and reactive; sclera anicteric;no lid lag.  Nose without nasal septal hypertrophy Mouth/Parynx benign; Mallinpatti scale 4 Neck: Very thick neck; No JVD, no carotid bruits; normal carotid upstroke Lungs: clear to  ausculatation and percussion; no wheezing or rales Chest wall: Nontender to palpation Heart: RRR, s1 s2 normal 3-3/8 systolic murmur.  No S3 or S4 gallop heard.  No diastolic murmur, rubs, thrills or heaves Abdomen: Moderate central adiposity soft, nontender; no hepatosplenomehaly, BS+; abdominal aorta nontender and not dilated by palpation. Back: No CVA tenderness Pulses 2+ Extremities: Improved.  Previously edema now trivial  ; no clubbing cyanosis, Homan's sign negative  Neurologic: grossly nonfocal Psychological: Normal affect and mood  ECG (independently read by me): Normal sinus rhythm with first-degree AV block with a PR interval at 232 ms.  No significant ST changes.  November 2015 ECG (independently read by me): Normal sinus rhythm at 62 bpm.  PR interval 190 ms QTc interval 454 ms.  Prior ECG (independently read by me): Normal sinus rhythm at 75 beats per minute. Borderline first degree AV block with a PR interval of 240 ms. QTC interval normal at 444 ms. No significant ST changes.   Prior ECG from 06/13/2013 : Sinus rhythm at 55 beats per minute. PR interval 202 ms. QTC interval 443 ms.  LABS:  BMET    Component Value Date/Time   NA 140 09/13/2014 0848   K 3.6* 09/13/2014 0848   CL 107 09/13/2014 0848   CO2 24 09/12/2014 1009   GLUCOSE 137* 09/13/2014 0848   BUN 33* 09/13/2014 0848   CREATININE 2.00* 09/13/2014 0848   CALCIUM 9.5 09/12/2014 1009   GFRNONAA 36* 09/12/2014 1009   GFRAA 42* 09/12/2014 1009     Hepatic Function Panel     Component Value Date/Time   PROT 8.0 09/12/2014 1009   ALBUMIN 3.9 09/12/2014 1009   AST 23 09/12/2014 1009   ALT 20 09/12/2014 1009   ALKPHOS 44 09/12/2014 1009   BILITOT 0.4 09/12/2014 1009     CBC    Component Value Date/Time   WBC 5.3 09/12/2014 1009   RBC 4.65 09/12/2014 1009   HGB 12.9* 09/13/2014 0848   HCT 38.0* 09/13/2014 0848   PLT 164 09/12/2014 1009   MCV 79.1 09/12/2014 1009   MCH 26.0 09/12/2014 1009    MCHC  32.9 09/12/2014 1009   RDW 14.4 09/12/2014 1009   LYMPHSABS 3.1 12/16/2012 1356   MONOABS 0.9 12/16/2012 1356   EOSABS 0.2 12/16/2012 1356   BASOSABS 0.0 12/16/2012 1356     BNP No results found for: PROBNP  Lipid Panel  No results found for: CHOL   RADIOLOGY: No results found.    ASSESSMENT AND PLAN: Mr. Ruben is a 74 year old African-American gentleman with morbid obesity who has significant cardiovascular comorbidities including hypertension with grade 1 diastolic dysfunction, type 2 diabetes mellitus, mixed hyperlipidemia, and severe sleep apnea with Alvarez therapy. Since I last saw him, he has made some slight progress in his weight, which previously had increased to 330 pounds and now is reduced to 318 pounds.  He continues to be morbidly obese.  We again discussed trying to get his weight down to < 300 if at all possible.  He is very  muscular and mesomorphic.   He continues to have blood pressure elevation.  Despite his current medical regimen.  I feel he may benefit from the addition of [place] blockade to his current regimen and I'm starting him on spironolactone 25 mg.  I have recommended he discontinue any potassium supplementation since this may result in some potassium sparing.  I reviewed the recent laboratory by Dr. Virgina Jock.  I will recheck a be met in 1 week following the initiation of spironolactone to make certain he is tolerating this from renal and potassium standpoint.  His edema has significantly improved.  He is tolerating simvastatin 40 mg for hyperlipidemia.  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  He is on ARB therapy with losartan, which should be helpful based on the Renaal  Trial.  I will see him in 6 weeks for reevaluation and further recommendations will be made at that time.  Time spent: 25 minutes   Troy Sine, MD, [facility]  04/26/2015 10:19 PM

## 2015-05-02 ENCOUNTER — Other Ambulatory Visit: Payer: Self-pay

## 2015-05-02 DIAGNOSIS — E876 Hypokalemia: Secondary | ICD-10-CM

## 2015-05-02 LAB — BASIC METABOLIC PANEL
BUN: 9 mg/dL (ref 6–23)
CALCIUM: 8.9 mg/dL (ref 8.4–10.5)
CO2: 25 meq/L (ref 19–32)
Chloride: 101 mEq/L (ref 96–112)
Creat: 1.06 mg/dL (ref 0.50–1.35)
Glucose, Bld: 89 mg/dL (ref 70–99)
Potassium: 3.4 mEq/L — ABNORMAL LOW (ref 3.5–5.3)
Sodium: 140 mEq/L (ref 135–145)

## 2015-05-08 ENCOUNTER — Encounter: Payer: Self-pay | Admitting: *Deleted

## 2015-05-15 ENCOUNTER — Telehealth: Payer: Self-pay | Admitting: *Deleted

## 2015-05-15 LAB — BASIC METABOLIC PANEL
BUN: 11 mg/dL (ref 6–23)
CO2: 28 meq/L (ref 19–32)
Calcium: 9.2 mg/dL (ref 8.4–10.5)
Chloride: 100 mEq/L (ref 96–112)
Creat: 1.08 mg/dL (ref 0.50–1.35)
GLUCOSE: 125 mg/dL — AB (ref 70–99)
Potassium: 3.4 mEq/L — ABNORMAL LOW (ref 3.5–5.3)
Sodium: 140 mEq/L (ref 135–145)

## 2015-05-15 NOTE — Telephone Encounter (Signed)
Called and informed patient and wife of lab results and recommendations. Patient states that he has potassium 20 meq already that was stopped by Dr. Claiborne Billings at his last visit.

## 2015-05-15 NOTE — Telephone Encounter (Signed)
-----   Message from Troy Sine, MD sent at 05/15/2015  2:34 PM EDT ----- Pt on losartan and spironolactone, and lasix; K remains mildly low. Give  dose of Kdur 89meq x1

## 2015-05-21 ENCOUNTER — Telehealth: Payer: Self-pay | Admitting: Cardiovascular Disease

## 2015-05-21 NOTE — Telephone Encounter (Signed)
Closed encounter °

## 2015-05-28 ENCOUNTER — Ambulatory Visit (INDEPENDENT_AMBULATORY_CARE_PROVIDER_SITE_OTHER): Payer: Medicare Other | Admitting: Cardiovascular Disease

## 2015-05-28 ENCOUNTER — Encounter: Payer: Self-pay | Admitting: Cardiovascular Disease

## 2015-05-28 VITALS — BP 170/90 | HR 56 | Ht 74.0 in | Wt 320.7 lb

## 2015-05-28 DIAGNOSIS — G4733 Obstructive sleep apnea (adult) (pediatric): Secondary | ICD-10-CM | POA: Diagnosis not present

## 2015-05-28 DIAGNOSIS — I10 Essential (primary) hypertension: Secondary | ICD-10-CM

## 2015-05-28 DIAGNOSIS — Z6841 Body Mass Index (BMI) 40.0 and over, adult: Secondary | ICD-10-CM

## 2015-05-28 DIAGNOSIS — E1165 Type 2 diabetes mellitus with hyperglycemia: Secondary | ICD-10-CM

## 2015-05-28 DIAGNOSIS — E876 Hypokalemia: Secondary | ICD-10-CM

## 2015-05-28 DIAGNOSIS — Z79899 Other long term (current) drug therapy: Secondary | ICD-10-CM | POA: Diagnosis not present

## 2015-05-28 DIAGNOSIS — E118 Type 2 diabetes mellitus with unspecified complications: Secondary | ICD-10-CM

## 2015-05-28 DIAGNOSIS — Z9989 Dependence on other enabling machines and devices: Secondary | ICD-10-CM

## 2015-05-28 DIAGNOSIS — R600 Localized edema: Secondary | ICD-10-CM

## 2015-05-28 DIAGNOSIS — IMO0002 Reserved for concepts with insufficient information to code with codable children: Secondary | ICD-10-CM

## 2015-05-28 MED ORDER — SPIRONOLACTONE 25 MG PO TABS: 25.0000 mg | ORAL_TABLET | Freq: Two times a day (BID) | ORAL | Status: DC

## 2015-05-28 NOTE — Progress Notes (Signed)
Patient ID: Bradley Alvarez, male   DOB: 1941/08/08, 74 y.o.   MRN: [id number]    HPI: Bradley Alvarez is a 74 y.o. African-American male who presents to the office for a follow-up cardiology  evaluation.  Bradley Alvarez has a history of hypertension, type 2 diabetes mellitus, mixed hyperlipidemia, severe sleep apnea with CPAP therapy, morbid obesity, and documented grade 1 diastolic dysfunction.  He underwent total knee replacement surgery on 12/20/2012 without cardiovascular complications. A nuclear perfusion study in August 2015  showed small apical area of attenuation without ischemia.  Ejection fraction was 53%.  He had 17 teeth pulled in September 2015 and underwent left ankle operation in October 2015.  In the past he has had issues  with shortness of breath and leg swelling.  Presently, he has been on carvedilol 25 mg twice a day, Plendil 10 mg daily, clonidine 0.1 mg twice a day, furosemide 20 mg and losartan 100 daily for blood pressure control.  I last saw him, he was having some difficulty with lower extremity edema.  I also reviewed lab work from [employer] which revealed mildly low potassium.  For improved blood pressure control, edema, and his low potassium.  I recommended initiation of spironolactone initially at 25 mg daily.  He has been on metformin 1000 g twice a day in addition to [place] for his diabetes mellitus.  Has been taking simvastatin 40 mg for hyperlipidemia..  With reference to his obstructive sleep apnea, he now has a new Resmed AirSense 10 AutoSet CPAP unit and is set at a 5 cm and a maximum pressure of 20 cm.  His mask is a [employer] full.  Face mask large.  A download from 03/05/2014 through 04/03/2014:  AHI is excellent at 1.4, with an apnea index of only 0.6.  His 95th percentile pressure was 9.9.  He did not have any central apnea events.  He is 100% compliant, averaging 9 hours and 11 minutes of usage daily.  He denies significant awareness of daytime  sleepiness.  He is unaware of breakthrough snoring.  His most recent Lipid studies from [place] revealed a total cholesterol 131, triglycerides 97, HDL 42, LDL 77.  Hemoglobin A1c was 6.1.  TSH 1.66.  He had evidence for urinary microalbuminurea.  He denies chest pain.  He denies PND, orthopnea.   Past Medical History  Diagnosis Date  . Diabetes mellitus type 2, uncontrolled, with complications   . Hypercholesteremia   . Benign prostate hyperplasia   . Morbid obesity with BMI of 45.0-49.9, adult   . Lichen planus   . Left knee DJD   . Shortness of breath     WITH EXERTION   . Sleep apnea, obstructive 10/14/08    PSA- [facility] AHI duuring total sleep was 61.8/hr and during REMsleep 0.0/hr RDI during total sleep was 96.1/hr. and during REM sleep at 0.0/hr. The average O2 sat range during REM and NREM was 94% The lowest O2 Sat during REM and NREM was 85%  Severe OSA /hypopnea syndrome. Notably events were worse in the supine position and during REM sleep.   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 Aortic valve sclerosis   . Hypertension 10/02/11    lexiscan myoview negative for ischemia EF 46%low risk scan.  ECHO 12/07/12 No change in previous echo.  . Hernia of abdominal wall   . Early cataracts, bilateral   . Heart murmur  as a young adult  . GERD (gastroesophageal reflux disease)   . Neuropathy     due to diabetes  . Depression     medication induced    Past Surgical History  Procedure Laterality Date  . 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 Suprapubic prostatectomy    . Knee arthroscopy      RT KNEE   . Total knee arthroplasty  12/20/2012    Procedure: TOTAL KNEE ARTHROPLASTY;  Surgeon: Lorn Junes, MD;  Location: [facility];  Service: Orthopedics;  Laterality: Left;  left total knee arthroplasty  . Cardiac catheterization    . Multiple tooth extractions    . Colonoscopy w/ biopsies and polypectomy      . Orif ankle fracture Left 09/13/2014    Procedure: Open Reduction Internal Fixation Left Ankle Medial Malleolus;  Surgeon: Newt Minion, MD;  Location: [facility];  Service: Orthopedics;  Laterality: Left;    No Known Allergies  Current Outpatient Prescriptions  Medication Sig Dispense Refill  . aspirin 81 MG tablet Take 81 mg by mouth daily.    . B-D UF III MINI PEN NEEDLES 31G X 5 MM MISC   3  . BYETTA 10 MCG PEN 10 MCG/0.04ML SOPN injection Inject 10 mcg into the skin 2 (two) times daily.    . carvedilol (COREG) 25 MG tablet Take 25 mg by mouth 2 (two) times daily with a meal.    . chlorhexidine (PERIDEX) 0.12 % solution   12  . clobetasol ointment (TEMOVATE) 9.98 % Apply 1 application topically once a week.    . cloNIDine (CATAPRES) 0.1 MG tablet Take 1 tablet by mouth 2 (two) times daily.    Marland Kitchen docusate sodium 100 MG CAPS 1 tab 2 times a day while on narcotics.  STOOL SOFTENER 60 capsule 0  . felodipine (PLENDIL) 5 MG 24 hr tablet Take 10 mg by mouth daily.    . fluticasone (CUTIVATE) 0.005 % ointment Apply 1 application topically 2 (two) times daily.    . furosemide (LASIX) 40 MG tablet Take 40 mg by mouth as needed (for swelling).     Marland Kitchen HYDROcodone-acetaminophen (NORCO/VICODIN) 5-325 MG per tablet Take 1 tablet by mouth every 12 (twelve) hours as needed for moderate pain.     Marland Kitchen ketoconazole (NIZORAL) 2 % shampoo Apply 1 application topically every 30 (thirty) days.    Astrid Drafts Omega-3 300 MG CAPS Take 1 capsule by mouth daily.    Marland Kitchen loratadine (CLARITIN) 10 MG tablet Take 20 mg by mouth every morning.    Marland Kitchen losartan (COZAAR) 100 MG tablet Take 1 tablet by mouth daily.    . metFORMIN (GLUCOPHAGE) 1000 MG tablet Take 1,000 mg by mouth 2 (two) times daily with a meal.    . Multiple Vitamins-Minerals ([employer] SPECTRAVITE SENIOR PO) Take 1 tablet by mouth daily.     . simvastatin (ZOCOR) 40 MG tablet Take 40 mg by mouth every evening.    Marland Kitchen spironolactone (ALDACTONE) 25 MG tablet Take 1 tablet  (25 mg total) by mouth 2 (two) times daily. 60 tablet 6  . urea (CARMOL) 10 % cream Apply 1 application topically once a week.     No current facility-administered medications for this visit.    Socially he is married and has 5 children. He is not able to exercise routinely. He has remote tobacco history. There is no alcohol. He states he did lose more weight but has regained some  weight back.  ROS General: Negative; No fevers, chills, or night sweats;  HEENT: Negative; No changes in vision or hearing, sinus congestion, difficulty swallowing Pulmonary: Negative; No cough, wheezing, shortness of breath, hemoptysis Cardiovascular: Negative; No chest pain, presyncope, syncope, palpitations Positive for edema GI: Negative; No nausea, vomiting, diarrhea, or abdominal pain GU: Negative; No dysuria, hematuria, or difficulty voiding Musculoskeletal: Negative; no myalgias, joint pain, or weakness Hematologic/Oncology: Negative; no easy bruising, bleeding Endocrine: Negative; no heat/cold intolerance; no diabetes Neuro: Negative; no changes in balance, headaches Skin: Negative; No rashes or skin lesions Psychiatric: Negative; No behavioral problems, depression Sleep: Positive for obstructive sleep apnea.  He cannot sleep without it.  He is unaware of breakthrough snoring No daytime sleepiness, hypersomnolence, bruxism, restless legs, hypnogognic hallucinations, no cataplexy Other comprehensive 14 point system review is negative.   PE BP 170/90 mmHg  Pulse 56  Ht _0  (1.88 m)  Wt 320 lb 11.2 oz (145.469 kg)  BMI 41.16 kg/m2  Repeat blood pressure 160/888.  He states his blood pressure at home typically runs in the 353I systolically.  Wt Readings from Last 3 Encounters:  05/28/15 320 lb 11.2 oz (145.469 kg)  04/24/15 318 lb 9.6 oz (144.516 kg)  10/17/14 330 lb (149.687 kg)   General: Alert, oriented, no distress. Morbidly obese. Skin: normal turgor, no rashes HEENT: Normocephalic,  atraumatic. Pupils round and reactive; sclera anicteric;no lid lag.  Nose without nasal septal hypertrophy Mouth/Parynx benign; Mallinpatti scale 4 Neck: Very thick neck; No JVD, no carotid bruits; normal carotid upstroke Lungs: clear to ausculatation and percussion; no wheezing or rales Chest wall: Nontender to palpation Heart: RRR, s1 s2 normal 1-4/4 systolic murmur.  No S3 or S4 gallop heard.  No diastolic murmur, rubs, thrills or heaves Abdomen: Moderate central adiposity soft, nontender; no hepatosplenomehaly, BS+; abdominal aorta nontender and not dilated by palpation. Back: No CVA tenderness Pulses 2+ Extremities: Improved.  Previously edema now trivial  ; no clubbing cyanosis, Homan's sign negative  Neurologic: grossly nonfocal Psychological: Normal affect and mood  ECG (independently read by me): Sinus bradycardia with first-degree AV block with PR interval of 216 ms.  No significant ST-T changes.  May 2016 ECG (independently read by me): Normal sinus rhythm with first-degree AV block with a PR interval at 232 ms.  No significant ST changes.  November 2015 ECG (independently read by me): Normal sinus rhythm at 62 bpm.  PR interval 190 ms QTc interval 454 ms.  Prior ECG (independently read by me): Normal sinus rhythm at 75 beats per minute. Borderline first degree AV block with a PR interval of 240 ms. QTC interval normal at 444 ms. No significant ST changes.   Prior ECG from 06/13/2013 : Sinus rhythm at 55 beats per minute. PR interval 202 ms. QTC interval 443 ms.  LABS:  BMP Latest Ref Rng 05/14/2015 05/01/2015 09/13/2014  Glucose 70 - 99 mg/dL 125(H) 89 137(H)  BUN 6 - 23 mg/dL 11 9 33(H)  Creatinine 0.50 - 1.35 mg/dL 1.08 1.06 2.00(H)  Sodium 135 - 145 mEq/L 140 140 140  Potassium 3.5 - 5.3 mEq/L 3.4(L) 3.4(L) 3.6(L)  Chloride 96 - 112 mEq/L 100 101 107  CO2 19 - 32 mEq/L 28 25 -  Calcium 8.4 - 10.5 mg/dL 9.2 8.9 -   Hepatic Function Latest Ref Rng 09/12/2014 12/16/2012  09/01/2009  Total Protein 6.0 - 8.3 g/dL 8.0 7.9 6.7  Albumin 3.5 - 5.2 g/dL 3.9 4.2 2.6(L)  AST 0 - 37 U/L  23 29 77(H)  ALT 0 - 53 U/L 20 27 39  Alk Phosphatase 39 - 117 U/L 44 43 33(L)  Total Bilirubin 0.3 - 1.2 mg/dL 0.4 0.3 1.0   CBC Latest Ref Rng 09/13/2014 09/12/2014 12/23/2012  WBC 4.0 - 10.5 K/uL - 5.3 10.5  Hemoglobin 13.0 - 17.0 g/dL 12.9(L) 12.1(L) 8.7(L)  Hematocrit 39.0 - 52.0 % 38.0(L) 36.8(L) 26.2(L)  Platelets 150 - 400 K/uL - 164 146(L)   Lab Results  Component Value Date   MCV 79.1 09/12/2014   MCV 78.9 12/23/2012   MCV 79.1 12/22/2012   No results found for: TSH  Lipid Panel  No results found for: CHOL, TRIG, HDL, CHOLHDL, VLDL, LDLCALC, LDLDIRECT   RADIOLOGY: No results found.    ASSESSMENT AND PLAN: Mr. Arey is a 74 year old African-American gentleman with morbid obesity who has significant cardiovascular comorbidities including hypertension with grade 1 diastolic dysfunction, type 2 diabetes mellitus, mixed hyperlipidemia, and severe sleep apnea with CPAP therapy.  He has morbid obesity and has lost some weight, down from 3:30.  His blood pressure today continues to be elevated and I will further titrate his spironolactone to 25 mg twice a day.  He will continue his current dose of losartan 100 mg, furosemide 40 mg, Plendil 10 mg, clonidine 0.1 mg twice a day and carvedilol 25 mg twice a day.  He continues to use CPAP with 100% compliance.  He cannot sleep without it.  We discussed sodium restriction.  We discussed a weight goal of at least less than 300 if at all possible, preferably more.  Lipid studies on his current dose of simvastatin are near target with an LDL of 77.  His renal function has improved.  He will have a follow-up bmet in 2 weeks following the increase spironolactone dose.  He is not having any chest pain.  I will see him in 4 months for reevaluation.  Time spent: 25 minutes   Troy Sine, MD, [facility]  05/28/2015 5:50 PM

## 2015-05-28 NOTE — Patient Instructions (Signed)
Your physician has recommended you make the following change in your medication: increase the spironalactone to 1 tablet twice a day. A new prescription has been sent to your pharmacy to reflect this change.  Your physician recommends that you return for lab work in: 2 weeks. This does not a fasting lab. You can eat and drink.  Your physician recommends that you schedule a follow-up appointment in: 4 months.

## 2015-06-11 ENCOUNTER — Ambulatory Visit: Payer: Medicare Other

## 2015-06-12 ENCOUNTER — Encounter: Payer: Self-pay | Admitting: Podiatry

## 2015-06-12 ENCOUNTER — Ambulatory Visit (INDEPENDENT_AMBULATORY_CARE_PROVIDER_SITE_OTHER): Payer: Medicare Other | Admitting: Podiatry

## 2015-06-12 DIAGNOSIS — M79673 Pain in unspecified foot: Secondary | ICD-10-CM

## 2015-06-12 DIAGNOSIS — E119 Type 2 diabetes mellitus without complications: Secondary | ICD-10-CM

## 2015-06-12 DIAGNOSIS — B351 Tinea unguium: Secondary | ICD-10-CM

## 2015-06-12 DIAGNOSIS — E118 Type 2 diabetes mellitus with unspecified complications: Secondary | ICD-10-CM | POA: Diagnosis not present

## 2015-06-12 DIAGNOSIS — E1165 Type 2 diabetes mellitus with hyperglycemia: Secondary | ICD-10-CM

## 2015-06-12 DIAGNOSIS — IMO0002 Reserved for concepts with insufficient information to code with codable children: Secondary | ICD-10-CM

## 2015-06-12 NOTE — Progress Notes (Signed)
Patient ID: Bradley Alvarez, male   DOB: January 03, 1941, 74 y.o.   MRN: [id number] Complaint:  Visit Type: Patient returns to my office for continued preventative foot care services. Complaint: Patient states" my nails have grown long and thick and become painful to walk and wear shoes" Patient has been diagnosed with DM with no complications. He presents for preventative foot care services. No changes to ROS  Podiatric Exam: Vascular: dorsalis pedis and posterior tibial pulses are palpable bilateral. Capillary return is immediate. Temperature gradient is WNL. Skin turgor WNL  Sensorium: Normal Semmes Weinstein monofilament test. Normal tactile sensation bilaterally. Nail Exam: Pt has thick disfigured discolored nails with subungual debris noted bilateral entire nail hallux through fifth toenails Ulcer Exam: There is no evidence of ulcer or pre-ulcerative changes or infection. Orthopedic Exam: Muscle tone and strength are WNL. No limitations in general ROM. No crepitus or effusions noted. Foot type and digits show no abnormalities. Bony prominences are unremarkable. Skin: No Porokeratosis. No infection or ulcers  Diagnosis:  Tinea unguium, Pain in right toe, pain in left toes  Treatment & Plan Procedures and Treatment: Consent by patient was obtained for treatment procedures. The patient understood the discussion of treatment and procedures well. All questions were answered thoroughly reviewed. Debridement of mycotic and hypertrophic toenails, 1 through 5 bilateral and clearing of subungual debris. No ulceration, no infection noted.  Return Visit-Office Procedure: Patient instructed to return to the office for a follow up visit 3 months for continued evaluation and treatment.

## 2015-06-13 LAB — BASIC METABOLIC PANEL
BUN: 13 mg/dL (ref 6–23)
CALCIUM: 8.8 mg/dL (ref 8.4–10.5)
CHLORIDE: 100 meq/L (ref 96–112)
CO2: 28 meq/L (ref 19–32)
Creat: 1.15 mg/dL (ref 0.50–1.35)
Glucose, Bld: 150 mg/dL — ABNORMAL HIGH (ref 70–99)
POTASSIUM: 3.8 meq/L (ref 3.5–5.3)
SODIUM: 140 meq/L (ref 135–145)

## 2015-06-14 ENCOUNTER — Telehealth: Payer: Self-pay | Admitting: *Deleted

## 2015-06-14 NOTE — Telephone Encounter (Signed)
Notified patient's wife of lab results.

## 2015-06-14 NOTE — Telephone Encounter (Signed)
-----   Message from Troy Sine, MD sent at 06/14/2015  2:42 PM EDT ----- K better; glu inc

## 2015-09-20 ENCOUNTER — Ambulatory Visit (INDEPENDENT_AMBULATORY_CARE_PROVIDER_SITE_OTHER): Payer: Medicare Other | Admitting: Podiatry

## 2015-09-20 ENCOUNTER — Encounter: Payer: Self-pay | Admitting: Podiatry

## 2015-09-20 DIAGNOSIS — B351 Tinea unguium: Secondary | ICD-10-CM | POA: Diagnosis not present

## 2015-09-20 DIAGNOSIS — M79673 Pain in unspecified foot: Secondary | ICD-10-CM

## 2015-09-20 DIAGNOSIS — E119 Type 2 diabetes mellitus without complications: Secondary | ICD-10-CM

## 2015-09-20 NOTE — Progress Notes (Signed)
Patient ID: Bradley Alvarez, male   DOB: 06/02/1941, 74 y.o.   MRN: [id number] Complaint:  Visit Type: Patient returns to my office for continued preventative foot care services. Complaint: Patient states" my nails have grown long and thick and become painful to walk and wear shoes" Patient has been diagnosed with DM with no complications. He presents for preventative foot care services. No changes to ROS  Podiatric Exam: Vascular: dorsalis pedis and posterior tibial pulses are palpable bilateral. Capillary return is immediate. Temperature gradient is WNL. Skin turgor WNL  Sensorium: Normal Semmes Weinstein monofilament test. Normal tactile sensation bilaterally. Nail Exam: Pt has thick disfigured discolored nails with subungual debris noted bilateral entire nail hallux through fifth toenails Ulcer Exam: There is no evidence of ulcer or pre-ulcerative changes or infection. Orthopedic Exam: Muscle tone and strength are WNL. No limitations in general ROM. No crepitus or effusions noted. Foot type and digits show no abnormalities. Bony prominences are unremarkable. Skin: No Porokeratosis. No infection or ulcers  Diagnosis:  Tinea unguium, Pain in right toe, pain in left toes  Treatment & Plan Procedures and Treatment: Consent by patient was obtained for treatment procedures. The patient understood the discussion of treatment and procedures well. All questions were answered thoroughly reviewed. Debridement of mycotic and hypertrophic toenails, 1 through 5 bilateral and clearing of subungual debris. No ulceration, no infection noted.  Return Visit-Office Procedure: Patient instructed to return to the office for a follow up visit 3 months for continued evaluation and treatment. 

## 2015-10-22 ENCOUNTER — Ambulatory Visit (INDEPENDENT_AMBULATORY_CARE_PROVIDER_SITE_OTHER): Payer: Medicare Other | Admitting: Cardiovascular Disease

## 2015-10-22 ENCOUNTER — Encounter: Payer: Self-pay | Admitting: Cardiovascular Disease

## 2015-10-22 VITALS — BP 180/120 | HR 71 | Ht 74.0 in | Wt 347.6 lb

## 2015-10-22 DIAGNOSIS — Z6841 Body Mass Index (BMI) 40.0 and over, adult: Secondary | ICD-10-CM

## 2015-10-22 DIAGNOSIS — Z79899 Other long term (current) drug therapy: Secondary | ICD-10-CM

## 2015-10-22 DIAGNOSIS — E119 Type 2 diabetes mellitus without complications: Secondary | ICD-10-CM

## 2015-10-22 DIAGNOSIS — I10 Essential (primary) hypertension: Secondary | ICD-10-CM | POA: Diagnosis not present

## 2015-10-22 DIAGNOSIS — R600 Localized edema: Secondary | ICD-10-CM

## 2015-10-22 DIAGNOSIS — E7800 Pure hypercholesterolemia, unspecified: Secondary | ICD-10-CM

## 2015-10-22 DIAGNOSIS — G4733 Obstructive sleep apnea (adult) (pediatric): Secondary | ICD-10-CM

## 2015-10-22 DIAGNOSIS — E785 Hyperlipidemia, unspecified: Secondary | ICD-10-CM

## 2015-10-22 DIAGNOSIS — Z794 Long term (current) use of insulin: Secondary | ICD-10-CM

## 2015-10-22 DIAGNOSIS — R0789 Other chest pain: Secondary | ICD-10-CM

## 2015-10-22 DIAGNOSIS — Z9989 Dependence on other enabling machines and devices: Secondary | ICD-10-CM

## 2015-10-22 MED ORDER — FUROSEMIDE 80 MG PO TABS: 80.0000 mg | ORAL_TABLET | Freq: Every day | ORAL | Status: DC

## 2015-10-22 NOTE — Patient Instructions (Signed)
Your physician recommends that you return for lab work fasting next week.  Your physician has recommended you make the following change in your medication: take 80 mg of furosemide daily. Take the spironolactone twice a day.  Your physician recommends that you schedule a follow-up appointment in: 8 weeks with Dr. Claiborne Billings.

## 2015-10-23 ENCOUNTER — Telehealth: Payer: Self-pay | Admitting: Cardiovascular Disease

## 2015-10-24 ENCOUNTER — Encounter: Payer: Self-pay | Admitting: Cardiovascular Disease

## 2015-10-24 NOTE — Telephone Encounter (Signed)
Close encounter 

## 2015-10-24 NOTE — Progress Notes (Signed)
Patient ID: Bradley Alvarez, male   DOB: Apr 04, 1941, 74 y.o.   MRN: [id number]    HPI: Bradley Alvarez is a 74 y.o. African-American male who presents to the office for a follow-up cardiology  evaluation.  Bradley Alvarez has a history of hypertension, type 2 diabetes mellitus, mixed hyperlipidemia, severe sleep apnea with CPAP therapy, morbid obesity, and documented grade 1 diastolic dysfunction.  He underwent total knee replacement surgery on 12/20/2012 without cardiovascular complications. A nuclear perfusion study in August 2015  showed small apical area of attenuation without ischemia.  Ejection fraction was 53%.  He had 17 teeth pulled in September 2015 and underwent left ankle operation in October 2015.  In the past he has had issues with dyspnea and leg swelling. He has been on carvedilol 25 mg twice a day, Plendil 10 mg daily, clonidine 0.1 mg twice a day, furosemide 20 mg and losartan 100 daily for blood pressure control.  I last saw him, he was having some difficulty with lower extremity edema.  I also reviewed lab work from [employer] which revealed mildly low potassium.  For improved blood pressure control, edema, and his low potassium  I recommended initiation of spironolactone initially at 25 mg daily.  He has been on metformin 1000 g twice a day in addition to Byetta for his diabetes mellitus.  Has been taking simvastatin 40 mg for hyperlipidemia..  With reference to his obstructive sleep apnea, he now has a new Resmed AirSense 10 AutoSet CPAP unit and is set at a 5 cm and a maximum pressure of 20 cm.  His mask is a [employer] full.  Face mask large.  A download from 03/05/2014 through 04/03/2014:  AHI is excellent at 1.4, with an apnea index of only 0.6.  His 95th percentile pressure was 9.9.  He did not have any central apnea events.  He is 100% compliant, averaging 9 hours and 11 minutes of usage daily.  He denies significant awareness of daytime sleepiness.  He is unaware of  breakthrough snoring.  His most recent Lipid studies from [place] revealed a total cholesterol 131, triglycerides 97, HDL 42, LDL 77.  Hemoglobin A1c was 6.1.  TSH 1.66.  He had evidence for urinary microalbuminurea.  Recently, Bradley Alvarez has noted increasing shortness of breath and lower extremity edema for at least a month.  He had been on furosemide 40 mg daily. Last week he called his primary physician who recommended 3 days of 80 mg; for the last several days has resumed his 40 mg dose.  His breathing has improved.  He continues to experience leg swelling.  He denies chest pressure.  He denies palpitations.  He tells me he will be seeing an ophthalmologist and will be undergoing cataract surgery.  He presents for evaluation.   Past Medical History  Diagnosis Date  . Diabetes mellitus type 2, uncontrolled, with complications ([place])   . Hypercholesteremia   . Benign prostate hyperplasia   . Morbid obesity with BMI of 45.0-49.9, adult ([place])   . Lichen planus   . Left knee DJD   . Shortness of breath     WITH EXERTION   . Sleep apnea, obstructive 10/14/08    PSA- [facility] AHI duuring total sleep was 61.8/hr and during REMsleep 0.0/hr RDI during total sleep was 96.1/hr. and during REM sleep at 0.0/hr. The average O2 sat range during REM and NREM was 94% The lowest O2 Sat during REM and  NREM was 85%  Severe OSA /hypopnea syndrome. Notably events were worse in the supine position and during REM sleep.   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 Aortic valve sclerosis   . Hypertension 10/02/11    lexiscan myoview negative for ischemia EF 46%low risk scan.  ECHO 12/07/12 No change in previous echo.  . Hernia of abdominal wall   . Early cataracts, bilateral   . Heart murmur     as a young adult  . GERD (gastroesophageal reflux disease)   . 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 Depression     medication induced    Past Surgical  History  Procedure Laterality Date  . 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 Suprapubic prostatectomy    . Knee arthroscopy      RT KNEE   . Total knee arthroplasty  12/20/2012    Procedure: TOTAL KNEE ARTHROPLASTY;  Surgeon: Lorn Junes, MD;  Location: [facility];  Service: Orthopedics;  Laterality: Left;  left total knee arthroplasty  . Cardiac catheterization    . Multiple tooth extractions    . Colonoscopy w/ biopsies and polypectomy    . Orif ankle fracture Left 09/13/2014    Procedure: Open Reduction Internal Fixation Left Ankle Medial Malleolus;  Surgeon: Newt Minion, MD;  Location: [facility];  Service: Orthopedics;  Laterality: Left;    No Known Allergies  Current Outpatient Prescriptions  Medication Sig Dispense Refill  . aspirin 81 MG tablet Take 81 mg by mouth daily.    . B-D UF III MINI PEN NEEDLES 31G X 5 MM MISC   3  . BYETTA 10 MCG PEN 10 MCG/0.04ML SOPN injection Inject 10 mcg into the skin 2 (two) times daily.    . carvedilol (COREG) 25 MG tablet Take 25 mg by mouth 2 (two) times daily with a meal.    . chlorhexidine (PERIDEX) 0.12 % solution   12  . clobetasol ointment (TEMOVATE) 2.58 % Apply 1 application topically once a week.    . cloNIDine (CATAPRES) 0.1 MG tablet Take 1 tablet by mouth 2 (two) times daily.    Marland Kitchen docusate sodium 100 MG CAPS 1 tab 2 times a day while on narcotics.  STOOL SOFTENER 60 capsule 0  . felodipine (PLENDIL) 5 MG 24 hr tablet Take 10 mg by mouth daily.    . fluticasone (CUTIVATE) 0.005 % ointment Apply 1 application topically 2 (two) times daily.    Marland Kitchen HYDROcodone-acetaminophen (NORCO/VICODIN) 5-325 MG per tablet Take 1 tablet by mouth every 12 (twelve) hours as needed for moderate pain.     Marland Kitchen ketoconazole (NIZORAL) 2 % shampoo Apply 1 application topically every 30 (thirty) days.    Astrid Drafts Omega-3 300 MG CAPS Take 1 capsule by mouth daily.    Marland Kitchen loratadine (CLARITIN) 10 MG tablet Take 20 mg by mouth every morning.    Marland Kitchen  losartan (COZAAR) 100 MG tablet Take 1 tablet by mouth daily.    . metFORMIN (GLUCOPHAGE) 1000 MG tablet Take 1,000 mg by mouth 2 (two) times daily with a meal.    . Multiple Vitamins-Minerals ([employer] SPECTRAVITE SENIOR PO) Take 1 tablet by mouth daily.     . simvastatin (ZOCOR) 40 MG tablet Take 40 mg by mouth every evening.    Marland Kitchen spironolactone (ALDACTONE) 25 MG tablet Take 1 tablet (25 mg total) by mouth 2 (two) times daily.  60 tablet 6  . urea (CARMOL) 10 % cream Apply 1 application topically once a week.    . furosemide (LASIX) 80 MG tablet Take 1 tablet (80 mg total) by mouth daily. 30 tablet 6   No current facility-administered medications for this visit.    Socially he is married and has 5 children. He is not able to exercise routinely. He has remote tobacco history. There is no alcohol. He states he did lose more weight but has regained some weight back.  ROS General: Negative; No fevers, chills, or night sweats;  HEENT: Negative; No changes in vision or hearing, sinus congestion, difficulty swallowing Pulmonary: Negative; No cough, wheezing, shortness of breath, hemoptysis Cardiovascular: Negative; No chest pain, presyncope, syncope, palpitations Positive for edema GI: Negative; No nausea, vomiting, diarrhea, or abdominal pain GU: Negative; No dysuria, hematuria, or difficulty voiding Musculoskeletal: Negative; no myalgias, joint pain, or weakness Hematologic/Oncology: Negative; no easy bruising, bleeding Endocrine: Negative; no heat/cold intolerance; no diabetes Neuro: Negative; no changes in balance, headaches Skin: Negative; No rashes or skin lesions Psychiatric: Negative; No behavioral problems, depression Sleep: Positive for obstructive sleep apnea.  He cannot sleep without it.  He is unaware of breakthrough snoring No daytime sleepiness, hypersomnolence, bruxism, restless legs, hypnogognic hallucinations, no cataplexy Other comprehensive 14 point system review is  negative.   PE BP 180/120 mmHg  Pulse 71  Ht _0  (1.88 m)  Wt 347 lb 9 oz (157.653 kg)  BMI 44.61 kg/m2  Repeat blood pressure 160/888.  He states his blood pressure at home typically runs in the 660Y systolically.  Wt Readings from Last 3 Encounters:  10/22/15 347 lb 9 oz (157.653 kg)  05/28/15 320 lb 11.2 oz (145.469 kg)  04/24/15 318 lb 9.6 oz (144.516 kg)   General: Alert, oriented, no distress. Morbidly obese. Skin: normal turgor, no rashes HEENT: Normocephalic, atraumatic. Pupils round and reactive; sclera anicteric;no lid lag.  Nose without nasal septal hypertrophy Mouth/Parynx benign; Mallinpatti scale 4 Neck: Very thick neck; No JVD, no carotid bruits; normal carotid upstroke Lungs: clear to ausculatation and percussion; no wheezing or rales Chest wall: Nontender to palpation Heart: RRR, s1 s2 normal 3-0/1 systolic murmur.  No S3 or S4 gallop heard.  No diastolic murmur, rubs, thrills or heaves Abdomen: Moderate central adiposity soft, nontender; no hepatosplenomehaly, BS+; abdominal aorta nontender and not dilated by palpation. Back: No CVA tenderness Pulses 2+ Extremities: Tense 1-2+ peripheral edema in his lower extremities bilaterally.; no clubbing cyanosis, Homan's sign negative  Neurologic: grossly nonfocal Psychological: Normal affect and mood  ECG (independently read by me): Normal sinus rhythm at 71 bpm.  Poor anterior R-wave progression.  Normal PR interval at 190 ms.  QTc interval 471 ms.  ECG (independently read by me): Sinus bradycardia with first-degree AV block with PR interval of 216 ms.  No significant ST-T changes.  May 2016 ECG (independently read by me): Normal sinus rhythm with first-degree AV block with a PR interval at 232 ms.  No significant ST changes.  November 2015 ECG (independently read by me): Normal sinus rhythm at 62 bpm.  PR interval 190 ms QTc interval 454 ms.  Prior ECG (independently read by me): Normal sinus rhythm at 75 beats  per minute. Borderline first degree AV block with a PR interval of 240 ms. QTC interval normal at 444 ms. No significant ST changes.   Prior ECG from 06/13/2013 : Sinus rhythm at 55 beats per minute. PR interval 202 ms. QTC interval 443 ms.  LABS:  BMP Latest Ref Rng 06/13/2015 05/14/2015 05/01/2015  Glucose 70 - 99 mg/dL 150(H) 125(H) 89  BUN 6 - 23 mg/dL _0 Creatinine 0.50 - 1.35 mg/dL 1.15 1.08 1.06  Sodium 135 - 145 mEq/L 140 140 140  Potassium 3.5 - 5.3 mEq/L 3.8 3.4(L) 3.4(L)  Chloride 96 - 112 mEq/L 100 100 101  CO2 19 - 32 mEq/L _1 Calcium 8.4 - 10.5 mg/dL 8.8 9.2 8.9   Hepatic Function Latest Ref Rng 09/12/2014 12/16/2012 09/01/2009  Total Protein 6.0 - 8.3 g/dL 8.0 7.9 6.7  Albumin 3.5 - 5.2 g/dL 3.9 4.2 2.6(L)  AST 0 - 37 U/L 23 29 77(H)  ALT 0 - 53 U/L 20 27 39  Alk Phosphatase 39 - 117 U/L 44 43 33(L)  Total Bilirubin 0.3 - 1.2 mg/dL 0.4 0.3 1.0   CBC Latest Ref Rng 09/13/2014 09/12/2014 12/23/2012  WBC 4.0 - 10.5 K/uL - 5.3 10.5  Hemoglobin 13.0 - 17.0 g/dL 12.9(L) 12.1(L) 8.7(L)  Hematocrit 39.0 - 52.0 % 38.0(L) 36.8(L) 26.2(L)  Platelets 150 - 400 K/uL - 164 146(L)   Lab Results  Component Value Date   MCV 79.1 09/12/2014   MCV 78.9 12/23/2012   MCV 79.1 12/22/2012   No results found for: TSH  Lipid Panel  No results found for: CHOL, TRIG, HDL, CHOLHDL, VLDL, LDLCALC, LDLDIRECT   RADIOLOGY: No results found.    ASSESSMENT AND PLAN: Bradley Alvarez is a 74 year old African-American gentleman with morbid obesity who has significant cardiovascular comorbidities including hypertension with grade 1 diastolic dysfunction, type 2 diabetes mellitus, mixed hyperlipidemia, and severe sleep apnea with CPAP therapy.  He has experienced increasing shortness of breath and leg edema.  His blood pressure today was elevated initially, a 180/124 taken by the nurse and this had significantly improved to 130/80 when taken by me.  I have recommended he titrate his  Spironolactone from 25 mg a day to twice a day and have recommended he increase his furosemide to 80 mg daily.  He will undergo follow-up laboratory next week.  His resting pulse is stable on his current dose of carvedilol 25 mg twice a day and he continues to take clonidine 0.1 mg twice a day, felodipine 10 mg daily and losartan 100 mg.  He is on simvastatin 40 mg for hyperlipidemia.  He is diabetic on byetta.  He admits to 100% compliance with reference to CPAP therapy.  I have given him clearance to undergo cataract surgery.  I will see him in 6 weeks for follow up evaluation and further recommendations will be made at that time.   Time spent: 25 minutes   Troy Sine, MD, [facility]  10/24/2015 1:02 PM

## 2015-10-30 LAB — COMPREHENSIVE METABOLIC PANEL
ALK PHOS: 42 U/L (ref 40–115)
ALT: 21 U/L (ref 9–46)
AST: 23 U/L (ref 10–35)
Albumin: 4.5 g/dL (ref 3.6–5.1)
BILIRUBIN TOTAL: 0.7 mg/dL (ref 0.2–1.2)
BUN: 13 mg/dL (ref 7–25)
CALCIUM: 9.2 mg/dL (ref 8.6–10.3)
CO2: 30 mmol/L (ref 20–31)
Chloride: 97 mmol/L — ABNORMAL LOW (ref 98–110)
Creat: 1.25 mg/dL — ABNORMAL HIGH (ref 0.70–1.18)
Glucose, Bld: 215 mg/dL — ABNORMAL HIGH (ref 65–99)
POTASSIUM: 3.5 mmol/L (ref 3.5–5.3)
Sodium: 138 mmol/L (ref 135–146)
Total Protein: 7.2 g/dL (ref 6.1–8.1)

## 2015-10-30 LAB — LIPID PANEL
Cholesterol: 144 mg/dL (ref 125–200)
HDL: 55 mg/dL (ref >=40)
LDL Cholesterol: 62 mg/dL (ref <130)
Total CHOL/HDL Ratio: 2.6 Ratio (ref <=5.0)
Triglycerides: 136 mg/dL (ref <150)
VLDL: 27 mg/dL (ref <30)

## 2015-10-30 LAB — HEMOGLOBIN A1C
Hgb A1c MFr Bld: 8.3 % — ABNORMAL HIGH (ref <5.7)
Mean Plasma Glucose: 192 mg/dL — ABNORMAL HIGH (ref <117)

## 2015-10-30 LAB — CBC
HEMATOCRIT: 40.9 % (ref 39.0–52.0)
Hemoglobin: 13 g/dL (ref 13.0–17.0)
MCH: 25.7 pg — ABNORMAL LOW (ref 26.0–34.0)
MCHC: 31.8 g/dL (ref 30.0–36.0)
MCV: 81 fL (ref 78.0–100.0)
MPV: 11.1 fL (ref 8.6–12.4)
Platelets: 178 10*3/uL (ref 150–400)
RBC: 5.05 MIL/uL (ref 4.22–5.81)
RDW: 15.3 % (ref 11.5–15.5)
WBC: 7.3 10*3/uL (ref 4.0–10.5)

## 2015-10-30 LAB — TSH: TSH: 3.006 u[IU]/mL (ref 0.350–4.500)

## 2015-11-13 ENCOUNTER — Telehealth: Payer: Self-pay | Admitting: *Deleted

## 2015-11-13 NOTE — Telephone Encounter (Signed)
Patient's wife informed of lab results. Copy faxed vis epic to Dr [name].

## 2015-11-13 NOTE — Telephone Encounter (Signed)
-----   Message from Troy Sine, MD sent at 11/13/2015 12:21 PM EST ----- Labs ok x Glu and HbA1c; needs improved DM ; send results to to primary

## 2015-12-19 ENCOUNTER — Telehealth: Payer: Self-pay | Admitting: Cardiovascular Disease

## 2015-12-19 ENCOUNTER — Ambulatory Visit (INDEPENDENT_AMBULATORY_CARE_PROVIDER_SITE_OTHER): Payer: Medicare Other | Admitting: Podiatry

## 2015-12-19 ENCOUNTER — Encounter: Payer: Self-pay | Admitting: Podiatry

## 2015-12-19 DIAGNOSIS — M79673 Pain in unspecified foot: Secondary | ICD-10-CM | POA: Diagnosis not present

## 2015-12-19 DIAGNOSIS — E119 Type 2 diabetes mellitus without complications: Secondary | ICD-10-CM | POA: Diagnosis not present

## 2015-12-19 DIAGNOSIS — B351 Tinea unguium: Secondary | ICD-10-CM | POA: Diagnosis not present

## 2015-12-19 NOTE — Progress Notes (Signed)
Patient ID: Bradley Alvarez, male   DOB: 02/28/1941, 74 y.o.   MRN: [id number] Complaint:  Visit Type: Patient returns to my office for continued preventative foot care services. Complaint: Patient states" my nails have grown long and thick and become painful to walk and wear shoes" Patient has been diagnosed with DM with no complications. He presents for preventative foot care services. No changes to ROS  Podiatric Exam: Vascular: dorsalis pedis and posterior tibial pulses are palpable bilateral. Capillary return is immediate. Temperature gradient is WNL. Skin turgor WNL  Sensorium: Normal Semmes Weinstein monofilament test. Normal tactile sensation bilaterally. Nail Exam: Pt has thick disfigured discolored nails with subungual debris noted bilateral entire nail hallux through fifth toenails Ulcer Exam: There is no evidence of ulcer or pre-ulcerative changes or infection. Orthopedic Exam: Muscle tone and strength are WNL. No limitations in general ROM. No crepitus or effusions noted. Foot type and digits show no abnormalities. Bony prominences are unremarkable. Skin: No Porokeratosis. No infection or ulcers  Diagnosis:  Tinea unguium, Pain in right toe, pain in left toes  Treatment & Plan Procedures and Treatment: Consent by patient was obtained for treatment procedures. The patient understood the discussion of treatment and procedures well. All questions were answered thoroughly reviewed. Debridement of mycotic and hypertrophic toenails, 1 through 5 bilateral and clearing of subungual debris. No ulceration, no infection noted.  Return Visit-Office Procedure: Patient instructed to return to the office for a follow up visit 3 months for continued evaluation and treatment. 

## 2015-12-20 NOTE — Telephone Encounter (Signed)
Close encounter 

## 2015-12-21 ENCOUNTER — Ambulatory Visit: Payer: Medicare Other | Admitting: Podiatry

## 2015-12-24 ENCOUNTER — Ambulatory Visit: Payer: Medicare Other | Admitting: Cardiovascular Disease

## 2015-12-26 ENCOUNTER — Other Ambulatory Visit: Payer: Self-pay | Admitting: Cardiovascular Disease

## 2015-12-27 ENCOUNTER — Encounter: Payer: Self-pay | Admitting: Cardiovascular Disease

## 2015-12-27 ENCOUNTER — Ambulatory Visit (INDEPENDENT_AMBULATORY_CARE_PROVIDER_SITE_OTHER): Payer: Medicare Other | Admitting: Cardiovascular Disease

## 2015-12-27 VITALS — BP 140/82 | HR 64 | Ht 74.0 in | Wt 328.0 lb

## 2015-12-27 DIAGNOSIS — I10 Essential (primary) hypertension: Secondary | ICD-10-CM

## 2015-12-27 DIAGNOSIS — E876 Hypokalemia: Secondary | ICD-10-CM

## 2015-12-27 DIAGNOSIS — G4733 Obstructive sleep apnea (adult) (pediatric): Secondary | ICD-10-CM

## 2015-12-27 DIAGNOSIS — E785 Hyperlipidemia, unspecified: Secondary | ICD-10-CM

## 2015-12-27 DIAGNOSIS — R600 Localized edema: Secondary | ICD-10-CM | POA: Diagnosis not present

## 2015-12-27 DIAGNOSIS — Z6841 Body Mass Index (BMI) 40.0 and over, adult: Secondary | ICD-10-CM

## 2015-12-27 DIAGNOSIS — Z9989 Dependence on other enabling machines and devices: Secondary | ICD-10-CM

## 2015-12-27 NOTE — Patient Instructions (Signed)
Your physician wants you to follow-up in: 6 MONTHS OR SOONER IF NEEDED. You will receive a reminder letter in the mail two months in advance. If you don't receive a letter, please call our office to schedule the follow-up appointment.   If you need a refill on your cardiac medications before your next appointment, please call your pharmacy.   

## 2015-12-27 NOTE — Progress Notes (Signed)
Patient ID: Bradley Alvarez, male   DOB: [birth year], 75 y.o.   MRN: [id number]    HPI: Bradley Alvarez is a 75 y.o. African-American male who presents to the office for a 2 month follow-up cardiology  evaluation.  Bradley Alvarez has a history of hypertension, type 2 diabetes mellitus, mixed hyperlipidemia, severe sleep apnea with CPAP therapy, morbid obesity, and documented grade 1 diastolic dysfunction.  He underwent total knee replacement surgery on 12/20/2012 without cardiovascular complications. A nuclear perfusion study in August 2015  showed small apical area of attenuation without ischemia.  Ejection fraction was 53%.  He had 17 teeth pulled in September 2015 and underwent left ankle operation in October 2015.  He has had issues with dyspnea and leg swelling. He has been on carvedilol 25 mg twice a day, Plendil 10 mg daily, clonidine 0.1 mg twice a day, furosemide 20 mg and losartan 100 daily for blood pressure control.  I last saw him, he was having some difficulty with lower extremity edema.  I also reviewed lab work from [employer] which revealed mildly low potassium.  For improved blood pressure control, edema, and his low potassium  I recommended initiation of spironolactone initially at 25 mg daily.  He has been on metformin 1000 g twice a day in addition to Byetta for his diabetes mellitus.  Has been taking simvastatin 40 mg for hyperlipidemia..  With reference to his obstructive sleep apnea, he has a new Resmed AirSense 10 AutoSet CPAP unit and is set at a 5 cm and a maximum pressure of 20 cm.  His mask is a [employer] full.  Face mask large.  A download from 03/05/2014 through 04/03/2014:  AHI is excellent at 1.4, with an apnea index of only 0.6.  His 95th percentile pressure was 9.9.  He did not have any central apnea events.  He is 100% compliant, averaging 9 hours and 11 minutes of usage daily.  He denies significant awareness of daytime sleepiness.  He is unaware of  breakthrough snoring.  His most recent Lipid studies from [place] revealed a total cholesterol 131, triglycerides 97, HDL 42, LDL 77.  Hemoglobin A1c was 6.1.  TSH 1.66.  He had evidence for urinary microalbuminurea.  When I last saw Bradley Alvarez had noted increasing shortness of breath and lower extremity edema for at least a month.  He had been on furosemide 40 mg daily.  His Lasix was increased to 60 mg daily with improvement over the past several months, Bradley Alvarez has made a dramatic change in his diet.  He also has stopped eating any foods after 8 PM.  This has resulted in almost a 20 pound weight loss.  When he was last seen .  He had stage II hypertension.  He feels improved.  He denies chest pain.  He denies palpitations.  He presents for reevaluation.  Past Medical History  Diagnosis Date  . Diabetes mellitus type 2, uncontrolled, with complications ([place])   . Hypercholesteremia   . Benign prostate hyperplasia   . Morbid obesity with BMI of 45.0-49.9, adult ([place])   . Lichen planus   . Left knee DJD   . Shortness of breath     WITH EXERTION   . Sleep apnea, obstructive 10/14/08    PSA- [facility] AHI duuring total sleep was 61.8/hr and during REMsleep 0.0/hr RDI during total sleep was 96.1/hr. and during REM sleep at 0.0/hr. The average O2 sat range during  REM and NREM was 94% The lowest O2 Sat during REM and NREM was 85%  Severe OSA /hypopnea syndrome. Notably events were worse in the supine position and during REM sleep.   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 Aortic valve sclerosis   . Hypertension 10/02/11    lexiscan myoview negative for ischemia EF 46%low risk scan.  ECHO 12/07/12 No change in previous echo.  . Hernia of abdominal wall   . Early cataracts, bilateral   . Heart murmur     as a young adult  . GERD (gastroesophageal reflux disease)   . 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 Depression     medication  induced    Past Surgical History  Procedure Laterality Date  . 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 Suprapubic prostatectomy    . Knee arthroscopy      RT KNEE   . Total knee arthroplasty  12/20/2012    Procedure: TOTAL KNEE ARTHROPLASTY;  Surgeon: Lorn Junes, MD;  Location: [facility];  Service: Orthopedics;  Laterality: Left;  left total knee arthroplasty  . Cardiac catheterization    . Multiple tooth extractions    . Colonoscopy w/ biopsies and polypectomy    . Orif ankle fracture Left 09/13/2014    Procedure: Open Reduction Internal Fixation Left Ankle Medial Malleolus;  Surgeon: Newt Minion, MD;  Location: [facility];  Service: Orthopedics;  Laterality: Left;    No Known Allergies  Current Outpatient Prescriptions  Medication Sig Dispense Refill  . aspirin 81 MG tablet Take 81 mg by mouth daily.    . B-D UF III MINI PEN NEEDLES 31G X 5 MM MISC   3  . BYETTA 10 MCG PEN 10 MCG/0.04ML SOPN injection Inject 10 mcg into the skin 2 (two) times daily.    . carvedilol (COREG) 25 MG tablet Take 25 mg by mouth 2 (two) times daily with a meal.    . cefdinir (OMNICEF) 300 MG capsule TK 1 C PO BID  0  . chlorhexidine (PERIDEX) 0.12 % solution   12  . clobetasol ointment (TEMOVATE) 6.19 % Apply 1 application topically once a week.    . cloNIDine (CATAPRES) 0.1 MG tablet Take 1 tablet by mouth 2 (two) times daily.    Marland Kitchen docusate sodium 100 MG CAPS 1 tab 2 times a day while on narcotics.  STOOL SOFTENER 60 capsule 0  . felodipine (PLENDIL) 5 MG 24 hr tablet Take 10 mg by mouth daily.    . fluticasone (CUTIVATE) 0.005 % ointment Apply 1 application topically 2 (two) times daily.    . furosemide (LASIX) 80 MG tablet Take 1 tablet (80 mg total) by mouth daily. 30 tablet 6  . HYDROcodone-acetaminophen (NORCO/VICODIN) 5-325 MG per tablet Take 1 tablet by mouth every 12 (twelve) hours as needed for moderate pain.     Marland Kitchen ketoconazole (NIZORAL) 2 % shampoo Apply 1 application topically  every 30 (thirty) days.    Astrid Drafts Omega-3 300 MG CAPS Take 1 capsule by mouth daily.    Marland Kitchen loratadine (CLARITIN) 10 MG tablet Take 20 mg by mouth every morning.    Marland Kitchen losartan (COZAAR) 100 MG tablet Take 1 tablet by mouth daily.    . metFORMIN (GLUCOPHAGE) 1000 MG tablet Take 1,000 mg by mouth 2 (two) times daily with a meal.    . Multiple Vitamins-Minerals ([employer] SPECTRAVITE SENIOR  PO) Take 1 tablet by mouth daily.     . simvastatin (ZOCOR) 40 MG tablet Take 40 mg by mouth every evening.    Marland Kitchen spironolactone (ALDACTONE) 25 MG tablet TAKE 1 TABLET(25 MG) BY MOUTH TWICE DAILY 180 tablet 2  . urea (CARMOL) 10 % cream Apply 1 application topically once a week.     No current facility-administered medications for this visit.    Socially he is married and has 5 children. He is not able to exercise routinely. He has remote tobacco history. There is no alcohol. He states he did lose more weight but has regained some weight back.  ROS General: Negative; No fevers, chills, or night sweats;  HEENT: Negative; No changes in vision or hearing, sinus congestion, difficulty swallowing Pulmonary: Negative; No cough, wheezing, shortness of breath, hemoptysis Cardiovascular: Negative; No chest pain, presyncope, syncope, palpitations Positive for edema GI: Negative; No nausea, vomiting, diarrhea, or abdominal pain GU: Negative; No dysuria, hematuria, or difficulty voiding Musculoskeletal: Negative; no myalgias, joint pain, or weakness Hematologic/Oncology: Negative; no easy bruising, bleeding Endocrine: Negative; no heat/cold intolerance; no diabetes Neuro: Negative; no changes in balance, headaches Skin: Negative; No rashes or skin lesions Psychiatric: Negative; No behavioral problems, depression Sleep: Positive for obstructive sleep apnea.  He cannot sleep without it.  He is unaware of breakthrough snoring No daytime sleepiness, hypersomnolence, bruxism, restless legs, hypnogognic hallucinations, no  cataplexy Other comprehensive 14 point system review is negative.   PE BP 140/82 mmHg  Pulse 64  Ht '6\' 2"'$  (1.88 m)  Wt 328 lb (148.78 kg)  BMI 42.09 kg/m2  Repeat blood pressure 160/888.  He states his blood pressure at home typically runs in the 932I systolically.  Wt Readings from Last 3 Encounters:  12/27/15 328 lb (148.78 kg)  10/22/15 347 lb 9 oz (157.653 kg)  05/28/15 320 lb 11.2 oz (145.469 kg)   General: Alert, oriented, no distress. Morbidly obese. Skin: normal turgor, no rashes HEENT: Normocephalic, atraumatic. Pupils round and reactive; sclera anicteric;no lid lag.  Nose without nasal septal hypertrophy Mouth/Parynx benign; Mallinpatti scale 4 Neck: Very thick neck; No JVD, no carotid bruits; normal carotid upstroke Lungs: clear to ausculatation and percussion; no wheezing or rales Chest wall: Nontender to palpation Heart: RRR, s1 s2 normal 7-1/2 systolic murmur.  No S3 or S4 gallop heard.  No diastolic murmur, rubs, thrills or heaves Abdomen: Moderate central adiposity soft, nontender; no hepatosplenomehaly, BS+; abdominal aorta nontender and not dilated by palpation. Back: No CVA tenderness Pulses 2+ Extremities: Resolution of previous tense edema in his lower extremities bilaterally.; no clubbing cyanosis, Homan's sign negative  Neurologic: grossly nonfocal Psychological: Normal affect and mood  ECG (independently read by me): Normal sinus rhythm at 64 bpm.  No ectopy.  November 2016 ECG (independently read by me): Normal sinus rhythm at 71 bpm.  Poor anterior R-wave progression.  Normal PR interval at 190 ms.  QTc interval 471 ms.  Prior ECG (independently read by me): Sinus bradycardia with first-degree AV block with PR interval of 216 ms.  No significant ST-T changes.  May 2016 ECG (independently read by me): Normal sinus rhythm with first-degree AV block with a PR interval at 232 ms.  No significant ST changes.  November 2015 ECG (independently read by me):  Normal sinus rhythm at 62 bpm.  PR interval 190 ms QTc interval 454 ms.  Prior ECG (independently read by me): Normal sinus rhythm at 75 beats per minute. Borderline first degree AV block with a  PR interval of 240 ms. QTC interval normal at 444 ms. No significant ST changes.   Prior ECG from 06/13/2013 : Sinus rhythm at 55 beats per minute. PR interval 202 ms. QTC interval 443 ms.  LABS:  BMP Latest Ref Rng 10/29/2015 06/13/2015 05/14/2015  Glucose 65 - 99 mg/dL 215(H) 150(H) 125(H)  BUN 7 - 25 mg/dL _0 Creatinine 0.70 - 1.18 mg/dL 1.25(H) 1.15 1.08  Sodium 135 - 146 mmol/L 138 140 140  Potassium 3.5 - 5.3 mmol/L 3.5 3.8 3.4(L)  Chloride 98 - 110 mmol/L 97(L) 100 100  CO2 20 - 31 mmol/L _1 Calcium 8.6 - 10.3 mg/dL 9.2 8.8 9.2   Hepatic Function Latest Ref Rng 10/29/2015 09/12/2014 12/16/2012  Total Protein 6.1 - 8.1 g/dL 7.2 8.0 7.9  Albumin 3.6 - 5.1 g/dL 4.5 3.9 4.2  AST 10 - 35 U/L _2 ALT 9 - 46 U/L _3 Alk Phosphatase 40 - 115 U/L 42 44 43  Total Bilirubin 0.2 - 1.2 mg/dL 0.7 0.4 0.3   CBC Latest Ref Rng 10/29/2015 09/13/2014 09/12/2014  WBC 4.0 - 10.5 K/uL 7.3 - 5.3  Hemoglobin 13.0 - 17.0 g/dL 13.0 12.9(L) 12.1(L)  Hematocrit 39.0 - 52.0 % 40.9 38.0(L) 36.8(L)  Platelets 150 - 400 K/uL 178 - 164   Lab Results  Component Value Date   MCV 81.0 10/29/2015   MCV 79.1 09/12/2014   MCV 78.9 12/23/2012   Lab Results  Component Value Date   TSH 3.006 10/29/2015    Lipid Panel     Component Value Date/Time   CHOL 144 10/29/2015 0836   TRIG 136 10/29/2015 0836   HDL 55 10/29/2015 0836   CHOLHDL 2.6 10/29/2015 0836   VLDL 27 10/29/2015 0836   LDLCALC 62 10/29/2015 0836     RADIOLOGY: No results found.    ASSESSMENT AND PLAN: Mr. Polan is a 75 year old African-American gentleman who has a history of significant morbid obesity and has cardiovascular comorbidities including hypertension with grade 1 diastolic dysfunction, type 2 diabetes  mellitus, mixed hyperlipidemia, and severe sleep apnea with CPAP therapy.  He has experienced increasing shortness of breath and leg edema.  I last saw him, his blood pressure was significantly elevated initially but improved later in the evaluation.  blood pressure today was elevated initially at 180/124 when taken by the nurse and this had significantly improved to 130/80 when taken by me.  I recommended titration of spironolactone.  His blood pressure medications now include spironolactone 20 50 g twice a day, losartan 100 mg daily, furosemide 80 mg daily, clonidine 0.1 mg twice a day,plendil 10 mg and carvedilol 25 mg twice a day.  His blood pressure today is excellent and on repeat by me was 124/80.  He is not having any palpitations.  He recently had action like symptoms and was started on Omnicef 3 mg capsules by Dr. [name].  He has severe sleep apnea and admits to 100% compliance with CPAP therapy.  He is diabetic on Byetta and metformin.  He has been taking simvastatin 40 mg for hyperlipidemia.  He denies myalgias.  He tells me he will be undergoing cataract surgery by Dr. Kathlen Mody on 01/16/2016.  As long as he is stable, I will see him in 6 months for reevaluation.  Time spent: 25 minutes   Troy Sine, MD, [facility]  12/27/2015 7:08 PM

## 2016-03-27 ENCOUNTER — Encounter: Payer: Self-pay | Admitting: Podiatry

## 2016-03-27 ENCOUNTER — Ambulatory Visit (INDEPENDENT_AMBULATORY_CARE_PROVIDER_SITE_OTHER): Payer: Medicare Other | Admitting: Podiatry

## 2016-03-27 DIAGNOSIS — M2140 Flat foot [pes planus] (acquired), unspecified foot: Secondary | ICD-10-CM

## 2016-03-27 DIAGNOSIS — M79673 Pain in unspecified foot: Secondary | ICD-10-CM | POA: Diagnosis not present

## 2016-03-27 DIAGNOSIS — E119 Type 2 diabetes mellitus without complications: Secondary | ICD-10-CM

## 2016-03-27 DIAGNOSIS — B351 Tinea unguium: Secondary | ICD-10-CM | POA: Diagnosis not present

## 2016-03-27 DIAGNOSIS — M2010 Hallux valgus (acquired), unspecified foot: Secondary | ICD-10-CM

## 2016-03-27 NOTE — Progress Notes (Addendum)
Patient ID: Bradley Alvarez, male   DOB: [birth year], 75 y.o.   MRN: [id number] Complaint:  Visit Type: Patient returns to my office for continued preventative foot care services. Complaint: Patient states" my nails have grown long and thick and become painful to walk and wear shoes" Patient has been diagnosed with DM with no complications. He presents for preventative foot care services. Eye surgery eyes B/L.  Podiatric Exam: Vascular: dorsalis pedis and posterior tibial pulses are palpable bilateral. Capillary return is immediate. Temperature gradient is WNL. Skin turgor WNL  Sensorium: Normal Semmes Weinstein monofilament test. Normal tactile sensation bilaterally. Nail Exam: Pt has thick disfigured discolored nails with subungual debris noted bilateral entire nail hallux through fifth toenails Ulcer Exam: There is no evidence of ulcer or pre-ulcerative changes or infection. Orthopedic Exam: Muscle tone and strength are WNL. No limitations in general ROM. No crepitus or effusions noted. PTT tendinitis/arthritis left foot.  HAV  B/L.  Pes planus B/L Skin: No Porokeratosis. No infection or ulcers  Diagnosis:  Tinea unguium, Pain in right toe, pain in left toes  Treatment & Plan Procedures and Treatment: Consent by patient was obtained for treatment procedures. The patient understood the discussion of treatment and procedures well. All questions were answered thoroughly reviewed. Debridement of mycotic and hypertrophic toenails, 1 through 5 bilateral and clearing of subungual debris. No ulceration, no infection noted. 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 Return Visit-Office Procedure: Patient instructed to return to the office for a follow up visit 3 months for continued evaluation and treatment.   Gardiner Barefoot DPM

## 2016-04-07 ENCOUNTER — Other Ambulatory Visit: Payer: Self-pay | Admitting: Gastroenterology

## 2016-04-30 ENCOUNTER — Encounter (HOSPITAL_COMMUNITY): Payer: Self-pay | Admitting: *Deleted

## 2016-05-11 ENCOUNTER — Other Ambulatory Visit: Payer: Self-pay | Admitting: Cardiovascular Disease

## 2016-05-12 NOTE — Telephone Encounter (Signed)
Rx request sent to pharmacy.  

## 2016-05-15 NOTE — H&P (Signed)
  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 On 03/2010 he was identified to have several adenomas. No complaints of hematochezia, melena, diarhea, or constipation. He reports SOB with exertion, but it appears to be a lower exercise tolerance. No issues with chest pain or MI.  Past Medical History  Diagnosis Date  . Diabetes mellitus type 2, uncontrolled, with complications ([place])   . Hypercholesteremia   . Benign prostate hyperplasia   . Morbid obesity with BMI of 45.0-49.9, adult ([place])   . Lichen planus   . Left knee DJD   . Shortness of breath     WITH EXERTION   . Sleep apnea, obstructive 10/14/08    PSA- [facility] AHI duuring total sleep was 61.8/hr and during REMsleep 0.0/hr RDI during total sleep was 96.1/hr. and during REM sleep at 0.0/hr. The average O2 sat range during REM and NREM was 94% The lowest O2 Sat during REM and NREM was 85%  Severe OSA /hypopnea syndrome. Notably events were worse in the supine position and during REM sleep.   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 Aortic valve sclerosis   . Hypertension 10/02/11    lexiscan myoview negative for ischemia EF 46%low risk scan.  ECHO 12/07/12 No change in previous echo.  . Hernia of abdominal wall   . Early cataracts, bilateral   . Heart murmur     as a young adult  . GERD (gastroesophageal reflux disease)   . 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 Depression     medication induced    Past Surgical History  Procedure Laterality Date  . 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 Suprapubic prostatectomy    . Knee arthroscopy      RT KNEE   . Total knee arthroplasty  12/20/2012    Procedure: TOTAL KNEE ARTHROPLASTY;  Surgeon: Lorn Junes, MD;  Location: [facility];  Service: Orthopedics;  Laterality: Left;  left total knee arthroplasty  . Cardiac catheterization    . Multiple tooth extractions    .  Colonoscopy w/ biopsies and polypectomy    . Orif ankle fracture Left 09/13/2014    Procedure: Open Reduction Internal Fixation Left Ankle Medial Malleolus;  Surgeon: Newt Minion, MD;  Location: [facility];  Service: Orthopedics;  Laterality: Left;    Family History  Problem Relation Age of Onset  . Hypertension    . Diabetes Mellitus II    . Stroke    . Heart disease      Social History:  reports that he quit smoking about 44 years ago. He has quit using smokeless tobacco. His smokeless tobacco use included Snuff and Chew. He reports that he drinks alcohol. He reports that he does not use illicit drugs.  Allergies: No Known Allergies  Medications: Scheduled: Continuous:  No results found for this or any previous visit (from the past 24 hour(s)).   No results found.  ROS:  As stated above in the HPI otherwise negative.  There were no vitals taken for this visit.    PE: Gen: NAD, Alert and Oriented HEENT:  [place]/AT, EOMI Neck: Supple, no LAD Lungs: CTA Bilaterally CV: RRR without M/G/R ABM: Soft, NTND, +BS Ext: No C/C/E  Assessment/Plan: 1) Personal history of polyps - Colonoscopy.  Ernest Orr D 05/15/2016, 11:16 AM

## 2016-05-16 ENCOUNTER — Ambulatory Visit (HOSPITAL_COMMUNITY)
Admission: RE | Admit: 2016-05-16 | Discharge: 2016-05-16 | Disposition: A | Discharge status: Home / Self Care | Payer: Medicare Other | Source: Ambulatory Visit | Attending: Gastroenterology | Admitting: Gastroenterology

## 2016-05-16 ENCOUNTER — Encounter (HOSPITAL_COMMUNITY)
Admission: RE | Disposition: A | Discharge status: Home / Self Care | Payer: Self-pay | Source: Ambulatory Visit | Attending: Gastroenterology

## 2016-05-16 ENCOUNTER — Ambulatory Visit (HOSPITAL_COMMUNITY): Payer: Medicare Other | Admitting: Anesthesiology

## 2016-05-16 ENCOUNTER — Encounter (HOSPITAL_COMMUNITY): Payer: Self-pay | Admitting: *Deleted

## 2016-05-16 DIAGNOSIS — Z6839 Body mass index (BMI) 39.0-39.9, adult: Secondary | ICD-10-CM | POA: Insufficient documentation

## 2016-05-16 DIAGNOSIS — K219 Gastro-esophageal reflux disease without esophagitis: Secondary | ICD-10-CM | POA: Diagnosis not present

## 2016-05-16 DIAGNOSIS — Z1211 Encounter for screening for malignant neoplasm of colon: Secondary | ICD-10-CM | POA: Insufficient documentation

## 2016-05-16 DIAGNOSIS — Z96653 Presence of artificial knee joint, bilateral: Secondary | ICD-10-CM | POA: Diagnosis not present

## 2016-05-16 DIAGNOSIS — E1140 Type 2 diabetes mellitus with diabetic neuropathy, unspecified: Secondary | ICD-10-CM | POA: Diagnosis not present

## 2016-05-16 DIAGNOSIS — I358 Other nonrheumatic aortic valve disorders: Secondary | ICD-10-CM | POA: Insufficient documentation

## 2016-05-16 DIAGNOSIS — G4733 Obstructive sleep apnea (adult) (pediatric): Secondary | ICD-10-CM | POA: Diagnosis not present

## 2016-05-16 DIAGNOSIS — I10 Essential (primary) hypertension: Secondary | ICD-10-CM | POA: Insufficient documentation

## 2016-05-16 DIAGNOSIS — Z87891 Personal history of nicotine dependence: Secondary | ICD-10-CM | POA: Insufficient documentation

## 2016-05-16 DIAGNOSIS — Z7984 Long term (current) use of oral hypoglycemic drugs: Secondary | ICD-10-CM | POA: Insufficient documentation

## 2016-05-16 DIAGNOSIS — Z79899 Other long term (current) drug therapy: Secondary | ICD-10-CM | POA: Insufficient documentation

## 2016-05-16 DIAGNOSIS — Z86010 Personal history of colonic polyps: Secondary | ICD-10-CM | POA: Diagnosis not present

## 2016-05-16 DIAGNOSIS — E7800 Pure hypercholesterolemia, unspecified: Secondary | ICD-10-CM | POA: Diagnosis not present

## 2016-05-16 DIAGNOSIS — D123 Benign neoplasm of transverse colon: Secondary | ICD-10-CM | POA: Insufficient documentation

## 2016-05-16 HISTORY — PX: COLONOSCOPY WITH PROPOFOL: SHX5780

## 2016-05-16 LAB — GLUCOSE, CAPILLARY: GLUCOSE-CAPILLARY: 127 mg/dL — AB (ref 65–99)

## 2016-05-16 SURGERY — COLONOSCOPY WITH PROPOFOL
Anesthesia: Monitor Anesthesia Care

## 2016-05-16 MED ORDER — LACTATED RINGERS IV SOLN
INTRAVENOUS | Status: DC
  Administered 2016-05-16: 1000 mL via INTRAVENOUS

## 2016-05-16 MED ORDER — PROPOFOL 10 MG/ML IV BOLUS
INTRAVENOUS | Status: AC
  Filled 2016-05-16: qty 40

## 2016-05-16 MED ORDER — PROPOFOL 10 MG/ML IV BOLUS
INTRAVENOUS | Status: AC
  Filled 2016-05-16: qty 20

## 2016-05-16 MED ORDER — PROPOFOL 10 MG/ML IV BOLUS
INTRAVENOUS | Status: AC
Start: 2016-05-16 — End: 2016-05-16
  Filled 2016-05-16: qty 20

## 2016-05-16 MED ORDER — PROPOFOL 500 MG/50ML IV EMUL
INTRAVENOUS | Status: DC | PRN
  Administered 2016-05-16 (×2): 30 mg via INTRAVENOUS

## 2016-05-16 MED ORDER — PROPOFOL 500 MG/50ML IV EMUL
INTRAVENOUS | Status: DC | PRN
  Administered 2016-05-16: 100 ug/kg/min via INTRAVENOUS

## 2016-05-16 MED ORDER — SODIUM CHLORIDE 0.9 % IV SOLN: INTRAVENOUS | Status: DC

## 2016-05-16 SURGICAL SUPPLY — 21 items

## 2016-05-16 NOTE — Anesthesia Preprocedure Evaluation (Addendum)
Anesthesia Evaluation  Patient identified by MRN, date of birth, ID band Patient awake    Reviewed: Allergy & Precautions, H&P , NPO status , Patient's Chart, lab work & pertinent test results, reviewed documented beta blocker date and time   Airway Mallampati: III  TM Distance: >3 FB Neck ROM: Full    Dental no notable dental hx. (+) Edentulous Upper, Edentulous Lower, Dental Advisory Given   Pulmonary sleep apnea and Continuous Positive Airway Pressure Ventilation , former smoker,    Pulmonary exam normal breath sounds clear to auscultation       Cardiovascular hypertension, Pt. on medications and Pt. on home beta blockers  Rhythm:Regular Rate:Normal     Neuro/Psych Depression negative neurological ROS     GI/Hepatic Neg liver ROS, GERD  Medicated and Controlled,  Endo/Other  diabetes, Type 2, Oral Hypoglycemic AgentsMorbid obesity  Renal/GU negative Renal ROS  negative genitourinary   Musculoskeletal  (+) Arthritis , Osteoarthritis,    Abdominal   Peds  Hematology negative hematology ROS (+) anemia ,   Anesthesia Other Findings   Reproductive/Obstetrics negative OB ROS                            Anesthesia Physical Anesthesia Plan  ASA: III  Anesthesia Plan: MAC   Post-op Pain Management:    Induction: Intravenous  Airway Management Planned: Simple Face Mask  Additional Equipment:   Intra-op Plan:   Post-operative Plan:   Informed Consent: I have reviewed the patients History and Physical, chart, labs and discussed the procedure including the risks, benefits and alternatives for the proposed anesthesia with the patient or authorized representative who has indicated his/her understanding and acceptance.   Dental advisory given  Plan Discussed with: CRNA  Anesthesia Plan Comments:         Anesthesia Quick Evaluation

## 2016-05-16 NOTE — Op Note (Signed)
[facility] Patient Name: Bradley Alvarez Procedure Date: 05/16/2016 MRN: [id number] Attending MD: Carol Ada , MD Date of Birth: 1941-08-11 CSN: [id number] Age: 75 Admit Type: Outpatient Procedure:                Colonoscopy Indications:              High risk colon cancer surveillance: Personal                            history of colonic polyps Providers:                Carol Ada, MD, Cleda Daub, RN, Otilio Saber, Technician, Herbie Drape, CRNA Referring MD:              Medicines:                Propofol per Anesthesia Complications:            No immediate complications. Estimated Blood Loss:     Estimated blood loss was minimal. Procedure:                Pre-Anesthesia Assessment:                           - Prior to the procedure, a History and Physical                            was performed, and patient medications and                            allergies were reviewed. The patient's tolerance of                            previous anesthesia was also reviewed. The risks                            and benefits of the procedure and the sedation                            options and risks were discussed with the patient.                            All questions were answered, and informed consent                            was obtained. Prior Anticoagulants: The patient has                            taken no previous anticoagulant or antiplatelet                            agents. ASA Grade Assessment: III - A patient with  severe systemic disease. After reviewing the risks                            and benefits, the patient was deemed in                            satisfactory condition to undergo the procedure.                           - Sedation was administered by an anesthesia                            professional. Deep sedation was attained.                           After obtaining  informed consent, the colonoscope                            was passed under direct vision. Throughout the                            procedure, the patient's blood pressure, pulse, and                            oxygen saturations were monitored continuously. The                            [id number] scope was introduced through                            the anus and advanced to the the cecum, identified                            by appendiceal orifice and ileocecal valve. The                            colonoscopy was performed without difficulty. The                            patient tolerated the procedure well. The quality                            of the bowel preparation was excellent. The                            ileocecal valve, appendiceal orifice, and rectum                            were photographed. Scope In: 11:34:08 PM Scope Out: 11:59:16 PM Scope Withdrawal Time: 0 hours 15 minutes 17 seconds  Total Procedure Duration: 0 hours 25 minutes 8 seconds  Findings:      Five sessile polyps were found in the transverse colon. The polyps were       3 to 4 mm in size. These polyps were  removed with a cold snare.       Resection and retrieval were complete. Impression:               - Five 3 to 4 mm polyps in the transverse colon,                            removed with a cold snare. Resected and retrieved. Moderate Sedation:      N/A- Per Anesthesia Care Recommendation:           - Patient has a contact number available for                            emergencies. The signs and symptoms of potential                            delayed complications were discussed with the                            patient. Return to normal activities tomorrow.                            Written discharge instructions were provided to the                            patient.                           - Resume previous diet.                           - Continue present  medications.                           - Await pathology results.                           - Repeat colonoscopy in 3 years for surveillance. Procedure Code(s):        --- Professional ---                           [phone], Colonoscopy, flexible; with removal of                            tumor(s), polyp(s), or other lesion(s) by snare                            technique Diagnosis Code(s):        --- Professional ---                           D12.3, Benign neoplasm of transverse colon (hepatic                            flexure or splenic flexure)  Z86.010, Personal history of colonic polyps CPT copyright 2016 American Medical Association. All rights reserved. The codes documented in this report are preliminary and upon coder review may  be revised to meet current compliance requirements. Carol Ada, MD Carol Ada, MD 05/16/2016 12:03:26 PM This report has been signed electronically. Number of Addenda: 0

## 2016-05-16 NOTE — Transfer of Care (Signed)
Immediate Anesthesia Transfer of Care Note  Patient: Bradley Alvarez  Procedure(s) Performed: Procedure(s): COLONOSCOPY WITH PROPOFOL (N/A)  Patient Location: PACU  Anesthesia Type:MAC  Level of Consciousness: sedated, patient cooperative and responds to stimulation  Airway & Oxygen Therapy: Patient Spontanous Breathing and Patient connected to face mask oxygen  Post-op Assessment: Report given to RN and Post -op Vital signs reviewed and stable  Post vital signs: Reviewed and stable  Last Vitals:  Filed Vitals:   05/16/16 1029  BP: 155/88  Pulse: 61  Temp: 36.5 C  Resp: 12    Last Pain: There were no vitals filed for this visit.       Complications: No apparent anesthesia complications

## 2016-05-16 NOTE — Anesthesia Postprocedure Evaluation (Signed)
Anesthesia Post Note  Patient: Bradley Alvarez  Procedure(s) Performed: Procedure(s) (LRB): COLONOSCOPY WITH PROPOFOL (N/A)  Patient location during evaluation: PACU Anesthesia Type: MAC Level of consciousness: awake and alert Pain management: pain level controlled Vital Signs Assessment: post-procedure vital signs reviewed and stable Respiratory status: spontaneous breathing, nonlabored ventilation and respiratory function stable Cardiovascular status: stable and blood pressure returned to baseline Anesthetic complications: no    Last Vitals:  Filed Vitals:   05/16/16 1220 05/16/16 1230  BP:  142/95  Pulse: 59 57  Temp:    Resp: 16 17    Last Pain: There were no vitals filed for this visit.               Kinsler Soeder,W. EDMOND

## 2016-05-17 ENCOUNTER — Emergency Department (HOSPITAL_COMMUNITY): Payer: Medicare Other

## 2016-05-17 ENCOUNTER — Emergency Department (HOSPITAL_COMMUNITY)
Admission: EM | Admit: 2016-05-17 | Discharge: 2016-05-18 | Disposition: A | Discharge status: Home / Self Care | Payer: Medicare Other | Attending: Emergency Medicine | Admitting: Emergency Medicine

## 2016-05-17 ENCOUNTER — Encounter (HOSPITAL_COMMUNITY): Payer: Self-pay | Admitting: Emergency Medicine

## 2016-05-17 DIAGNOSIS — Z7984 Long term (current) use of oral hypoglycemic drugs: Secondary | ICD-10-CM | POA: Diagnosis not present

## 2016-05-17 DIAGNOSIS — R1013 Epigastric pain: Secondary | ICD-10-CM | POA: Diagnosis present

## 2016-05-17 DIAGNOSIS — Z8679 Personal history of other diseases of the circulatory system: Secondary | ICD-10-CM | POA: Insufficient documentation

## 2016-05-17 DIAGNOSIS — I10 Essential (primary) hypertension: Secondary | ICD-10-CM | POA: Diagnosis not present

## 2016-05-17 DIAGNOSIS — R112 Nausea with vomiting, unspecified: Secondary | ICD-10-CM | POA: Diagnosis not present

## 2016-05-17 DIAGNOSIS — E119 Type 2 diabetes mellitus without complications: Secondary | ICD-10-CM | POA: Diagnosis not present

## 2016-05-17 DIAGNOSIS — R1033 Periumbilical pain: Secondary | ICD-10-CM | POA: Insufficient documentation

## 2016-05-17 DIAGNOSIS — F329 Major depressive disorder, single episode, unspecified: Secondary | ICD-10-CM | POA: Insufficient documentation

## 2016-05-17 DIAGNOSIS — Z9889 Other specified postprocedural states: Secondary | ICD-10-CM | POA: Insufficient documentation

## 2016-05-17 DIAGNOSIS — Z7982 Long term (current) use of aspirin: Secondary | ICD-10-CM | POA: Diagnosis not present

## 2016-05-17 DIAGNOSIS — Z87891 Personal history of nicotine dependence: Secondary | ICD-10-CM | POA: Insufficient documentation

## 2016-05-17 DIAGNOSIS — R1084 Generalized abdominal pain: Secondary | ICD-10-CM

## 2016-05-17 DIAGNOSIS — K5900 Constipation, unspecified: Secondary | ICD-10-CM | POA: Insufficient documentation

## 2016-05-17 DIAGNOSIS — Z79899 Other long term (current) drug therapy: Secondary | ICD-10-CM | POA: Diagnosis not present

## 2016-05-17 LAB — COMPREHENSIVE METABOLIC PANEL
ALT: 22 U/L (ref 17–63)
ANION GAP: 11 (ref 5–15)
AST: 27 U/L (ref 15–41)
Albumin: 4.5 g/dL (ref 3.5–5.0)
Alkaline Phosphatase: 40 U/L (ref 38–126)
BILIRUBIN TOTAL: 0.9 mg/dL (ref 0.3–1.2)
BUN: 9 mg/dL (ref 6–20)
CALCIUM: 9.2 mg/dL (ref 8.9–10.3)
CO2: 27 mmol/L (ref 22–32)
Chloride: 96 mmol/L — ABNORMAL LOW (ref 101–111)
Creatinine, Ser: 1.23 mg/dL (ref 0.61–1.24)
GFR, EST NON AFRICAN AMERICAN: 56 mL/min — AB (ref >60)
GLUCOSE: 184 mg/dL — AB (ref 65–99)
POTASSIUM: 2.8 mmol/L — AB (ref 3.5–5.1)
Sodium: 134 mmol/L — ABNORMAL LOW (ref 135–145)
TOTAL PROTEIN: 8 g/dL (ref 6.5–8.1)

## 2016-05-17 LAB — CBC
HCT: 39.9 % (ref 39.0–52.0)
HEMOGLOBIN: 13.6 g/dL (ref 13.0–17.0)
MCH: 26.7 pg (ref 26.0–34.0)
MCHC: 34.1 g/dL (ref 30.0–36.0)
MCV: 78.2 fL (ref 78.0–100.0)
Platelets: 177 10*3/uL (ref 150–400)
RBC: 5.1 MIL/uL (ref 4.22–5.81)
RDW: 14 % (ref 11.5–15.5)
WBC: 10.7 10*3/uL — AB (ref 4.0–10.5)

## 2016-05-17 LAB — I-STAT CG4 LACTIC ACID, ED: LACTIC ACID, VENOUS: 3 mmol/L — AB (ref 0.5–2.0)

## 2016-05-17 LAB — CBG MONITORING, ED: GLUCOSE-CAPILLARY: 180 mg/dL — AB (ref 65–99)

## 2016-05-17 LAB — LIPASE, BLOOD: LIPASE: 22 U/L (ref 11–51)

## 2016-05-17 MED ORDER — ONDANSETRON HCL 4 MG/2ML IJ SOLN
4.0000 mg | Freq: Once | INTRAMUSCULAR | Status: AC | PRN
  Administered 2016-05-17: 4 mg via INTRAVENOUS
  Filled 2016-05-17: qty 2

## 2016-05-17 MED ORDER — IOPAMIDOL (ISOVUE-300) INJECTION 61%
100.0000 mL | Freq: Once | INTRAVENOUS | Status: AC | PRN
  Administered 2016-05-17: 100 mL via INTRAVENOUS

## 2016-05-17 MED ORDER — HYDROMORPHONE HCL 1 MG/ML IJ SOLN
0.5000 mg | Freq: Once | INTRAMUSCULAR | Status: AC
  Administered 2016-05-17: 0.5 mg via INTRAVENOUS
  Filled 2016-05-17: qty 1

## 2016-05-17 NOTE — Discharge Instructions (Addendum)

## 2016-05-17 NOTE — ED Notes (Signed)
Patient complaining of mid upper abdominal pain. Patient had a colonoscopy yesterday. Patient states he did not pass gas before he left the colonoscopy procedure and was having problems with his stomach then. Patient states that he has nausea and vomiting over the course of the day. Patient states that his blood sugars have been high today also.

## 2016-05-17 NOTE — ED Notes (Signed)
Informed Dr. Alfonse Spruce of lactic acid of 3.00.

## 2016-05-17 NOTE — ED Notes (Signed)
MD at bedside. 

## 2016-05-17 NOTE — ED Notes (Signed)
Bed: [id number] Expected date:  Expected time:  Means of arrival:  Comments: Katon, Maraldo

## 2016-05-18 ENCOUNTER — Other Ambulatory Visit: Payer: Self-pay

## 2016-05-18 LAB — URINALYSIS, ROUTINE W REFLEX MICROSCOPIC
BILIRUBIN URINE: NEGATIVE
GLUCOSE, UA: NEGATIVE mg/dL
HGB URINE DIPSTICK: NEGATIVE
Ketones, ur: 15 mg/dL — AB
Leukocytes, UA: NEGATIVE
Nitrite: NEGATIVE
PROTEIN: NEGATIVE mg/dL
Specific Gravity, Urine: 1.046 — ABNORMAL HIGH (ref 1.005–1.030)
pH: 6 (ref 5.0–8.0)

## 2016-05-18 LAB — I-STAT TROPONIN, ED: TROPONIN I, POC: 0 ng/mL (ref 0.00–0.08)

## 2016-05-18 LAB — I-STAT CG4 LACTIC ACID, ED: Lactic Acid, Venous: 2.95 mmol/L (ref 0.5–2.0)

## 2016-05-18 MED ORDER — GI COCKTAIL ~~LOC~~
30.0000 mL | Freq: Once | ORAL | Status: AC
  Administered 2016-05-18: 30 mL via ORAL
  Filled 2016-05-18: qty 30

## 2016-05-18 MED ORDER — POTASSIUM CHLORIDE 10 MEQ/100ML IV SOLN
10.0000 meq | Freq: Once | INTRAVENOUS | Status: AC
  Administered 2016-05-18: 10 meq via INTRAVENOUS
  Filled 2016-05-18: qty 100

## 2016-05-18 MED ORDER — POTASSIUM CHLORIDE CRYS ER 20 MEQ PO TBCR
40.0000 meq | EXTENDED_RELEASE_TABLET | Freq: Once | ORAL | Status: AC
  Administered 2016-05-18: 40 meq via ORAL
  Filled 2016-05-18: qty 2

## 2016-05-18 MED ORDER — SODIUM CHLORIDE 0.9 % IV BOLUS (SEPSIS)
1000.0000 mL | Freq: Once | INTRAVENOUS | Status: AC
  Administered 2016-05-18: 1000 mL via INTRAVENOUS

## 2016-05-18 NOTE — ED Provider Notes (Signed)
Care assumed from Dr. Alfonse Spruce at shift change. Patient receiving IV fluids for slightly elevated lactate level. He is 2 days status post colonoscopy. His CT scan reveals no radiographically-visible complications. He has no white count, fever, or bleeding. He is now feeling better after fluids and medications in the ER. I believe he is appropriate for discharge. He is to return as needed for any problems. I suspect his pain may be related to residual air from the colonoscopy.  Veryl Speak, MD 05/18/16 [phone]

## 2016-05-18 NOTE — ED Provider Notes (Signed)
CSN: [id number]     Arrival date & time 05/17/16  2138 History   First MD Initiated Contact with Patient 05/17/16 2207     Chief Complaint  Patient presents with  . Abdominal Pain     (Consider location/radiation/quality/duration/timing/severity/associated sxs/prior Treatment) HPI Comments: 75 year old male with history of DM, Hypercholesterolemia, sleep apnea, HTN presents for for abdominal pain.  The patient states that he underwent a colonoscopy yesterday.  He states he felt fine before the colonoscopy but that he has developed pain in his upper abdomen and distension over the course of the day.  He reports associated nausea and vomiting.  No fevers or chills.  Patient is a 75 y.o. male presenting with abdominal pain.  Abdominal Pain Associated symptoms: constipation, nausea and vomiting (x1)   Associated symptoms: no chest pain, no chills, no cough, no diarrhea, no dysuria, no fatigue, no fever, no hematuria and no shortness of breath     Past Medical History  Diagnosis Date  . Diabetes mellitus type 2, uncontrolled, with complications ([place])   . Hypercholesteremia   . Benign prostate hyperplasia   . Morbid obesity with BMI of 45.0-49.9, adult ([place])   . Lichen planus   . Left knee DJD   . Shortness of breath     WITH EXERTION   . Sleep apnea, obstructive 10/14/08    PSA- [facility] AHI duuring total sleep was 61.8/hr and during REMsleep 0.0/hr RDI during total sleep was 96.1/hr. and during REM sleep at 0.0/hr. The average O2 sat range during REM and NREM was 94% The lowest O2 Sat during REM and NREM was 85%  Severe OSA /hypopnea syndrome. Notably events were worse in the supine position and during REM sleep.   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 Aortic valve sclerosis   . Hypertension 10/02/11    lexiscan myoview negative for ischemia EF 46%low risk scan.  ECHO 12/07/12 No change in previous echo.  . Hernia of abdominal wall   .  Early cataracts, bilateral   . Heart murmur     as a young adult  . GERD (gastroesophageal reflux disease)   . 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 Depression     medication induced   Past Surgical History  Procedure Laterality Date  . 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 Suprapubic prostatectomy    . Knee arthroscopy      RT KNEE   . Total knee arthroplasty  12/20/2012    Procedure: TOTAL KNEE ARTHROPLASTY;  Surgeon: Lorn Junes, MD;  Location: [place];  Service: Orthopedics;  Laterality: Left;  left total knee arthroplasty  . Cardiac catheterization    . Multiple tooth extractions    . Colonoscopy w/ biopsies and polypectomy    . Orif ankle fracture Left 09/13/2014    Procedure: Open Reduction Internal Fixation Left Ankle Medial Malleolus;  Surgeon: Newt Minion, MD;  Location: [facility];  Service: Orthopedics;  Laterality: Left;   Family History  Problem Relation Age of Onset  . Hypertension    . Diabetes Mellitus II    . Stroke    . Heart disease     Social History  Substance Use Topics  . Smoking status: Former Smoker -- 1.50 packs/day for 10 years    Quit date: 12/14/1971  . Smokeless tobacco: Former Systems developer    Types: Snuff, Chew  .  Alcohol Use: 0.0 oz/week    0 Standard drinks or equivalent per week     Comment: 1/5 of liqour will last him about 2 weeks    Review of Systems  Constitutional: Negative for fever, chills and fatigue.  HENT: Negative for congestion, postnasal drip and rhinorrhea.   Eyes: Negative for visual disturbance.  Respiratory: Negative for cough, chest tightness, shortness of breath and wheezing.   Cardiovascular: Negative for chest pain and palpitations.  Gastrointestinal: Positive for nausea, vomiting (x1), abdominal pain and constipation. Negative for diarrhea.  Genitourinary: Negative for dysuria, urgency and hematuria.  Musculoskeletal: Negative for myalgias and back pain.  Skin: Negative for rash and wound.   Neurological: Negative for dizziness, weakness, light-headedness and headaches.  Hematological: Does not bruise/bleed easily.      Allergies  Review of patient's allergies indicates no known allergies.  Home Medications   Prior to Admission medications   Medication Sig Start Date End Date Taking? Authorizing Provider  aspirin EC 81 MG tablet Take 81 mg by mouth daily.   Yes Historical Provider, MD  B-D UF III MINI PEN NEEDLES 31G X 5 MM MISC  02/08/15  Yes Historical Provider, MD  BYETTA 10 MCG PEN 10 MCG/0.04ML SOPN injection Inject 10 mcg into the skin daily.  10/07/13  Yes Historical Provider, MD  carvedilol (COREG) 25 MG tablet Take 25 mg by mouth 2 (two) times daily with a meal.   Yes Historical Provider, MD  clobetasol ointment (TEMOVATE) [id number] % Apply 1 application topically daily.  09/23/13  Yes Historical Provider, MD  cloNIDine (CATAPRES) 0.1 MG tablet Take 0.1 mg by mouth 2 (two) times daily.  04/09/15  Yes Historical Provider, MD  Cyanocobalamin 2500 MCG TABS Take 2,500 mcg by mouth daily.   Yes Historical Provider, MD  diclofenac sodium (VOLTAREN) 1 % GEL Apply 1 application topically 4 (four) times daily as needed (For pain.).   Yes Historical Provider, MD  docusate sodium 100 MG CAPS 1 tab 2 times a day while on narcotics.  STOOL SOFTENER Patient taking differently: Take 100-300 mg by mouth 2 (two) times daily as needed (for constipation).  12/23/12  Yes Kirstin Shepperson, PA-C  felodipine (PLENDIL) 5 MG 24 hr tablet Take 10 mg by mouth every morning.    Yes Historical Provider, MD  FIBER PO Take 1 capsule by mouth daily.   Yes Historical Provider, MD  furosemide (LASIX) 80 MG tablet TAKE 1 TABLET(80 MG) BY MOUTH DAILY 05/12/16  Yes Troy Sine, MD  HYDROcodone-acetaminophen (NORCO/VICODIN) 5-325 MG per tablet Take 1 tablet by mouth every 12 (twelve) hours as needed for moderate pain.  12/06/13  Yes Historical Provider, MD  loratadine (CLARITIN) 10 MG tablet Take 20 mg by mouth  every morning.   Yes Historical Provider, MD  losartan (COZAAR) 100 MG tablet Take 100 tablets by mouth daily.  03/29/15  Yes Historical Provider, MD  MEGARED OMEGA-3 KRILL OIL PO Take 1 tablet by mouth daily.   Yes Historical Provider, MD  metFORMIN (GLUCOPHAGE) 1000 MG tablet Take 1,000 mg by mouth 2 (two) times daily with a meal.   Yes Historical Provider, MD  Multiple Vitamin (MULTIVITAMIN WITH MINERALS) TABS tablet Take 1 tablet by mouth daily.   Yes Historical Provider, MD  Glory Rosebush VERIO test strip USE TO TEST BID AS DIRECTED 03/20/16  Yes Historical Provider, MD  simvastatin (ZOCOR) 40 MG tablet Take 40 mg by mouth every evening.   Yes Historical Provider, MD  spironolactone (ALDACTONE) 25  MG tablet TAKE 1 TABLET(25 MG) BY MOUTH TWICE DAILY 12/26/15  Yes Troy Sine, MD  vitamin C (ASCORBIC ACID) 500 MG tablet Take 500 mg by mouth daily as needed (For immune system.).    Yes Historical Provider, MD   BP 163/89 mmHg  Pulse 57  Temp(Src) 98.2 F (36.8 C) (Oral)  Resp 17  Ht 6\' 2"  (1.88 m)  Wt 306 lb (138.801 kg)  BMI 39.27 kg/m2  SpO2 97% Physical Exam  Constitutional: He is oriented to person, place, and time. He appears well-developed and well-nourished. No distress.  HENT:  Head: Normocephalic and atraumatic.  Right Ear: External ear normal.  Left Ear: External ear normal.  Mouth/Throat: Oropharynx is clear and moist. No oropharyngeal exudate.  Eyes: EOM are normal. Pupils are equal, round, and reactive to light.  Neck: Normal range of motion. Neck supple.  Cardiovascular: Normal rate, regular rhythm, normal heart sounds and intact distal pulses.   No murmur heard. Pulmonary/Chest: Effort normal. No respiratory distress. He has no wheezes. He has no rales.  Abdominal: Soft. He exhibits distension (mild). There is tenderness (mild) in the epigastric area and periumbilical area.  Musculoskeletal: He exhibits no edema.  Neurological: He is alert and oriented to person, place,  and time.  Skin: Skin is warm and dry. No rash noted. He is not diaphoretic.  Vitals reviewed.   ED Course  Procedures (including critical care time) Labs Review Labs Reviewed  COMPREHENSIVE METABOLIC PANEL - Abnormal; Notable for the following:    Sodium 134 (*)    Potassium 2.8 (*)    Chloride 96 (*)    Glucose, Bld 184 (*)    GFR calc non Af Amer 56 (*)    All other components within normal limits  CBC - Abnormal; Notable for the following:    WBC 10.7 (*)    All other components within normal limits  URINALYSIS, ROUTINE W REFLEX MICROSCOPIC (NOT AT [facility]) - Abnormal; Notable for the following:    Specific Gravity, Urine 1.046 (*)    Ketones, ur 15 (*)    All other components within normal limits  CBG MONITORING, ED - Abnormal; Notable for the following:    Glucose-Capillary 180 (*)    All other components within normal limits  I-STAT CG4 LACTIC ACID, ED - Abnormal; Notable for the following:    Lactic Acid, Venous 3.00 (*)    All other components within normal limits  LIPASE, BLOOD  I-STAT TROPOININ, ED  I-STAT CG4 LACTIC ACID, ED    Imaging Review Ct Abdomen Pelvis W Contrast  05/18/2016  CLINICAL DATA:  Abdominal pain and distension. Colonoscopy yesterday. EXAM: CT ABDOMEN AND PELVIS WITH CONTRAST TECHNIQUE: Multidetector CT imaging of the abdomen and pelvis was performed using the standard protocol following bolus administration of intravenous contrast. CONTRAST:  140mL ISOVUE-300 IOPAMIDOL (ISOVUE-300) INJECTION 61% COMPARISON:  CT 08/29/2009.  Pelvic MRI 04/28/2009 FINDINGS: Lower chest: Chronic elevation of right hemidiaphragm with adjacent atelectasis. No pleural effusion. Scattered coronary artery calcifications. Liver: No focal hepatic lesion. Hepatobiliary: Gallbladder physiologically distended, no calcified stone. No biliary dilatation. Pancreas: No ductal dilatation or inflammation. Spleen: Normal. Adrenal glands: Minimal thickening of the right adrenal gland  without discrete nodule. The left adrenal gland is normal. Kidneys: Symmetric renal enhancement. No hydronephrosis. Mild symmetric perinephric soft tissue stranding. There is a simple cyst in the interpolar right kidney. Exophytic cyst from the posterior left kidney. Stomach/Bowel: Stomach is decompressed. There are no dilated or thickened small bowel loops.  No abnormal colonic distention. Small volume of stool throughout the colon without colonic wall thickening. No pericolonic inflammation. No evidence appendicitis. Vascular/Lymphatic: No retroperitoneal adenopathy. Prominent right inguinal node is unchanged from prior MRI. Abdominal aorta is normal in caliber. Mild atherosclerosis without aneurysm. Reproductive: Prostate gland not well visualized. Bladder: Physiologically distended without wall thickening. Other: No free air, free fluid, or intra-abdominal fluid collection. Fat within the left inguinal canal. Musculoskeletal: There are no acute or suspicious osseous abnormalities. Fatty infiltration of right tensor fascia lata muscle. 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 IMPRESSION: No acute abnormality in the abdomen/pelvis. No evidence of complication post colonoscopy. Electronically Signed   By: Jeb Levering M.D.   On: 05/18/2016 00:13   I have personally reviewed and evaluated these images and lab results as part of my medical decision-making.   EKG Interpretation ED ECG REPORT   Date: 05/18/2016  Rate: 62  Rhythm: normal sinus rhythm  QRS Axis: normal  Intervals: normal  ST/T Wave abnormalities: normal  Conduction Disutrbances:borderline intraventricular conduction delay  Narrative Interpretation:   Old EKG Reviewed: unchanged  I have personally reviewed the EKG tracing and agree with the computerized printout as noted.     MDM  Patient was seen and evaluated in stable condition. Patient with mild epigastric and periumbilical abdominal discomfort on examination.  Laboratory studies other than lactic acid and potassium unremarkable. Lactic acid 3.0. Potassium 2.8 and supplemented. CT abdomen and pelvis without acute process. EKG and troponin unremarkable. Patient tolerating oral fluids. Discussed all results with patient and his wife at bedside. Case signed out pending repeat lactic acid after completion of IV fluids. Final diagnoses:  None    1. Abdominal pain    Harvel Quale, MD 05/18/16 [phone]

## 2016-05-18 NOTE — ED Notes (Signed)
Lactic given to RN and MD.   

## 2016-05-18 NOTE — Discharge Instructions (Signed)
Return to the emergency department if you develop high fever, bleeding from your bowels, worsening pain, or other new and concerning symptoms   Abdominal Pain, Adult Many things can cause abdominal pain. Usually, abdominal pain is not caused by a disease and will improve without treatment. It can often be observed and treated at home. Your health care provider will do a physical exam and possibly order blood tests and X-rays to help determine the seriousness of your pain. However, in many cases, more time must pass before a clear cause of the pain can be found. Before that point, your health care provider may not know if you need more testing or further treatment. HOME CARE INSTRUCTIONS Monitor your abdominal pain for any changes. The following actions may help to alleviate any discomfort you are experiencing:  Only take over-the-counter or prescription medicines as directed by your health care provider.  Do not take laxatives unless directed to do so by your health care provider.  Try a clear liquid diet (broth, tea, or water) as directed by your health care provider. Slowly move to a bland diet as tolerated. SEEK MEDICAL CARE IF:  You have unexplained abdominal pain.  You have abdominal pain associated with nausea or diarrhea.  You have pain when you urinate or have a bowel movement.  You experience abdominal pain that wakes you in the night.  You have abdominal pain that is worsened or improved by eating food.  You have abdominal pain that is worsened with eating fatty foods.  You have a fever. SEEK IMMEDIATE MEDICAL CARE IF:  Your pain does not go away within 2 hours.  You keep throwing up (vomiting).  Your pain is felt only in portions of the abdomen, such as the right side or the left lower portion of the abdomen.  You pass bloody or black tarry stools. MAKE SURE YOU:  Understand these instructions.  Will watch your condition.  Will get help right away if you are not  doing well or get worse.   This information is not intended to replace advice given to you by your health care provider. Make sure you discuss any questions you have with your health care provider.   Document Released: 08/27/2005 Document Revised: 08/08/2015 Document Reviewed: 07/27/2013 Elsevier Interactive Patient Education [employer].

## 2016-05-18 NOTE — ED Notes (Addendum)
Pt transported to CT ?

## 2016-05-19 ENCOUNTER — Encounter (HOSPITAL_COMMUNITY): Payer: Self-pay | Admitting: Gastroenterology

## 2016-05-22 ENCOUNTER — Inpatient Hospital Stay (HOSPITAL_COMMUNITY): Payer: Medicare Other

## 2016-05-22 ENCOUNTER — Emergency Department (HOSPITAL_COMMUNITY): Payer: Medicare Other

## 2016-05-22 ENCOUNTER — Inpatient Hospital Stay (HOSPITAL_COMMUNITY)
Admission: EM | Admit: 2016-05-22 | Discharge: 2016-05-25 | DRG: 683 | Disposition: A | Discharge status: Home / Self Care | Payer: Medicare Other | Attending: Internal Medicine | Admitting: Internal Medicine

## 2016-05-22 ENCOUNTER — Encounter (HOSPITAL_COMMUNITY): Payer: Self-pay | Admitting: Emergency Medicine

## 2016-05-22 DIAGNOSIS — I358 Other nonrheumatic aortic valve disorders: Secondary | ICD-10-CM | POA: Diagnosis present

## 2016-05-22 DIAGNOSIS — Z96653 Presence of artificial knee joint, bilateral: Secondary | ICD-10-CM | POA: Diagnosis present

## 2016-05-22 DIAGNOSIS — E1142 Type 2 diabetes mellitus with diabetic polyneuropathy: Secondary | ICD-10-CM | POA: Diagnosis present

## 2016-05-22 DIAGNOSIS — Z7982 Long term (current) use of aspirin: Secondary | ICD-10-CM

## 2016-05-22 DIAGNOSIS — Z96651 Presence of right artificial knee joint: Secondary | ICD-10-CM | POA: Diagnosis present

## 2016-05-22 DIAGNOSIS — Z79899 Other long term (current) drug therapy: Secondary | ICD-10-CM | POA: Diagnosis not present

## 2016-05-22 DIAGNOSIS — N189 Chronic kidney disease, unspecified: Secondary | ICD-10-CM | POA: Diagnosis present

## 2016-05-22 DIAGNOSIS — E871 Hypo-osmolality and hyponatremia: Secondary | ICD-10-CM

## 2016-05-22 DIAGNOSIS — N141 Nephropathy induced by other drugs, medicaments and biological substances: Secondary | ICD-10-CM | POA: Diagnosis present

## 2016-05-22 DIAGNOSIS — E1140 Type 2 diabetes mellitus with diabetic neuropathy, unspecified: Secondary | ICD-10-CM | POA: Diagnosis present

## 2016-05-22 DIAGNOSIS — E7800 Pure hypercholesterolemia, unspecified: Secondary | ICD-10-CM | POA: Diagnosis present

## 2016-05-22 DIAGNOSIS — N179 Acute kidney failure, unspecified: Secondary | ICD-10-CM | POA: Diagnosis present

## 2016-05-22 DIAGNOSIS — G4733 Obstructive sleep apnea (adult) (pediatric): Secondary | ICD-10-CM | POA: Diagnosis present

## 2016-05-22 DIAGNOSIS — T508X5A Adverse effect of diagnostic agents, initial encounter: Secondary | ICD-10-CM | POA: Diagnosis present

## 2016-05-22 DIAGNOSIS — R7989 Other specified abnormal findings of blood chemistry: Secondary | ICD-10-CM

## 2016-05-22 DIAGNOSIS — Z87891 Personal history of nicotine dependence: Secondary | ICD-10-CM

## 2016-05-22 DIAGNOSIS — Z7984 Long term (current) use of oral hypoglycemic drugs: Secondary | ICD-10-CM | POA: Diagnosis not present

## 2016-05-22 DIAGNOSIS — R112 Nausea with vomiting, unspecified: Secondary | ICD-10-CM | POA: Diagnosis not present

## 2016-05-22 DIAGNOSIS — N400 Enlarged prostate without lower urinary tract symptoms: Secondary | ICD-10-CM | POA: Diagnosis present

## 2016-05-22 DIAGNOSIS — K219 Gastro-esophageal reflux disease without esophagitis: Secondary | ICD-10-CM | POA: Diagnosis present

## 2016-05-22 DIAGNOSIS — E1165 Type 2 diabetes mellitus with hyperglycemia: Secondary | ICD-10-CM | POA: Diagnosis present

## 2016-05-22 DIAGNOSIS — E118 Type 2 diabetes mellitus with unspecified complications: Secondary | ICD-10-CM

## 2016-05-22 DIAGNOSIS — N170 Acute kidney failure with tubular necrosis: Principal | ICD-10-CM | POA: Diagnosis present

## 2016-05-22 DIAGNOSIS — IMO0002 Reserved for concepts with insufficient information to code with codable children: Secondary | ICD-10-CM

## 2016-05-22 DIAGNOSIS — I10 Essential (primary) hypertension: Secondary | ICD-10-CM | POA: Diagnosis not present

## 2016-05-22 DIAGNOSIS — R0600 Dyspnea, unspecified: Secondary | ICD-10-CM | POA: Diagnosis not present

## 2016-05-22 DIAGNOSIS — I5030 Unspecified diastolic (congestive) heart failure: Secondary | ICD-10-CM | POA: Diagnosis present

## 2016-05-22 DIAGNOSIS — I130 Hypertensive heart and chronic kidney disease with heart failure and stage 1 through stage 4 chronic kidney disease, or unspecified chronic kidney disease: Secondary | ICD-10-CM | POA: Diagnosis present

## 2016-05-22 DIAGNOSIS — Z6841 Body Mass Index (BMI) 40.0 and over, adult: Secondary | ICD-10-CM

## 2016-05-22 DIAGNOSIS — E876 Hypokalemia: Secondary | ICD-10-CM | POA: Diagnosis present

## 2016-05-22 DIAGNOSIS — E119 Type 2 diabetes mellitus without complications: Secondary | ICD-10-CM | POA: Diagnosis present

## 2016-05-22 LAB — CBC
HEMATOCRIT: 32.1 % — AB (ref 39.0–52.0)
Hemoglobin: 11.1 g/dL — ABNORMAL LOW (ref 13.0–17.0)
MCH: 26.4 pg (ref 26.0–34.0)
MCHC: 34.6 g/dL (ref 30.0–36.0)
MCV: 76.4 fL — ABNORMAL LOW (ref 78.0–100.0)
PLATELETS: 176 10*3/uL (ref 150–400)
RBC: 4.2 MIL/uL — ABNORMAL LOW (ref 4.22–5.81)
RDW: 13.5 % (ref 11.5–15.5)
WBC: 10.2 10*3/uL (ref 4.0–10.5)

## 2016-05-22 LAB — MAGNESIUM: MAGNESIUM: 1.4 mg/dL — AB (ref 1.7–2.4)

## 2016-05-22 LAB — BASIC METABOLIC PANEL
Anion gap: 14 (ref 5–15)
BUN: 42 mg/dL — AB (ref 6–20)
CALCIUM: 8.1 mg/dL — AB (ref 8.9–10.3)
CO2: 24 mmol/L (ref 22–32)
CREATININE: 4.65 mg/dL — AB (ref 0.61–1.24)
Chloride: 87 mmol/L — ABNORMAL LOW (ref 101–111)
GFR calc Af Amer: 13 mL/min — ABNORMAL LOW (ref >60)
GFR, EST NON AFRICAN AMERICAN: 11 mL/min — AB (ref >60)
GLUCOSE: 212 mg/dL — AB (ref 65–99)
Potassium: 2.9 mmol/L — ABNORMAL LOW (ref 3.5–5.1)
SODIUM: 125 mmol/L — AB (ref 135–145)

## 2016-05-22 LAB — URINE MICROSCOPIC-ADD ON

## 2016-05-22 LAB — URINALYSIS, ROUTINE W REFLEX MICROSCOPIC
Bilirubin Urine: NEGATIVE
GLUCOSE, UA: NEGATIVE mg/dL
Ketones, ur: NEGATIVE mg/dL
LEUKOCYTES UA: NEGATIVE
Nitrite: NEGATIVE
PH: 5 (ref 5.0–8.0)
Protein, ur: NEGATIVE mg/dL
Specific Gravity, Urine: 1.009 (ref 1.005–1.030)

## 2016-05-22 LAB — I-STAT CG4 LACTIC ACID, ED: LACTIC ACID, VENOUS: 1.57 mmol/L (ref 0.5–2.0)

## 2016-05-22 LAB — NA AND K (SODIUM & POTASSIUM), RAND UR
POTASSIUM UR: 11 mmol/L
Sodium, Ur: 37 mmol/L

## 2016-05-22 LAB — GLUCOSE, CAPILLARY
GLUCOSE-CAPILLARY: 167 mg/dL — AB (ref 65–99)
Glucose-Capillary: 193 mg/dL — ABNORMAL HIGH (ref 65–99)

## 2016-05-22 LAB — TROPONIN I
Troponin I: <0.03 ng/mL (ref <0.031)
Troponin I: <0.03 ng/mL (ref <0.031)
Troponin I: <0.03 ng/mL (ref <0.031)

## 2016-05-22 LAB — CBG MONITORING, ED: Glucose-Capillary: 228 mg/dL — ABNORMAL HIGH (ref 65–99)

## 2016-05-22 LAB — CREATININE, URINE, RANDOM: Creatinine, Urine: 106.93 mg/dL

## 2016-05-22 MED ORDER — SODIUM CHLORIDE 0.9 % IV BOLUS (SEPSIS): 1000.0000 mL | Freq: Once | INTRAVENOUS | Status: DC

## 2016-05-22 MED ORDER — SODIUM CHLORIDE 0.9 % IV BOLUS (SEPSIS)
1000.0000 mL | Freq: Once | INTRAVENOUS | Status: AC
  Administered 2016-05-22: 1000 mL via INTRAVENOUS

## 2016-05-22 MED ORDER — TECHNETIUM TC 99M DIETHYLENETRIAME-PENTAACETIC ACID: 30.9000 | Freq: Once | INTRAVENOUS | Status: DC | PRN

## 2016-05-22 MED ORDER — SODIUM CHLORIDE 0.9% FLUSH
3.0000 mL | Freq: Two times a day (BID) | INTRAVENOUS | Status: DC
  Administered 2016-05-22: 3 mL via INTRAVENOUS

## 2016-05-22 MED ORDER — INSULIN ASPART 100 UNIT/ML ~~LOC~~ SOLN
0.0000 [IU] | Freq: Every day | SUBCUTANEOUS | Status: DC
  Administered 2016-05-23: 2 [IU] via SUBCUTANEOUS

## 2016-05-22 MED ORDER — POTASSIUM CHLORIDE CRYS ER 20 MEQ PO TBCR
40.0000 meq | EXTENDED_RELEASE_TABLET | Freq: Once | ORAL | Status: AC
  Administered 2016-05-22: 40 meq via ORAL
  Filled 2016-05-22: qty 2

## 2016-05-22 MED ORDER — ACETAMINOPHEN 650 MG RE SUPP: 650.0000 mg | Freq: Four times a day (QID) | RECTAL | Status: DC | PRN

## 2016-05-22 MED ORDER — ACETAMINOPHEN 325 MG PO TABS: 650.0000 mg | ORAL_TABLET | Freq: Four times a day (QID) | ORAL | Status: DC | PRN

## 2016-05-22 MED ORDER — HYDROCODONE-ACETAMINOPHEN 5-325 MG PO TABS: 1.0000 | ORAL_TABLET | Freq: Two times a day (BID) | ORAL | Status: DC | PRN

## 2016-05-22 MED ORDER — SODIUM CHLORIDE 0.9 % IV SOLN
INTRAVENOUS | Status: DC
  Administered 2016-05-22 – 2016-05-23 (×2): via INTRAVENOUS
  Administered 2016-05-23: 1000 mL via INTRAVENOUS
  Administered 2016-05-24 (×2): via INTRAVENOUS

## 2016-05-22 MED ORDER — HEPARIN SODIUM (PORCINE) 5000 UNIT/ML IJ SOLN
5000.0000 [IU] | Freq: Three times a day (TID) | INTRAMUSCULAR | Status: DC
  Administered 2016-05-22 – 2016-05-25 (×8): 5000 [IU] via SUBCUTANEOUS
  Filled 2016-05-22 (×8): qty 1

## 2016-05-22 MED ORDER — TECHNETIUM TO 99M ALBUMIN AGGREGATED: 4.3000 | Freq: Once | INTRAVENOUS | Status: DC | PRN

## 2016-05-22 MED ORDER — INSULIN ASPART 100 UNIT/ML ~~LOC~~ SOLN
0.0000 [IU] | Freq: Three times a day (TID) | SUBCUTANEOUS | Status: DC
  Administered 2016-05-22 – 2016-05-23 (×3): 2 [IU] via SUBCUTANEOUS
  Administered 2016-05-23: 1 [IU] via SUBCUTANEOUS
  Administered 2016-05-24 (×2): 3 [IU] via SUBCUTANEOUS
  Administered 2016-05-24 – 2016-05-25 (×2): 2 [IU] via SUBCUTANEOUS

## 2016-05-22 NOTE — ED Notes (Signed)
Pt. Unable to urinate. Pt. Aware of urine specimen.  Nurse aware.

## 2016-05-22 NOTE — H&P (Signed)
History and Physical    Bradley Alvarez [id number] DOB: 1941/10/17 DOA: 05/22/2016  Referring MD/NP/PA:  PCP: Precious Reel, MD Outpatient Specialists:  Patient coming from:   Chief Complaint:   HPI: Bradley Alvarez is a 75 y.o. male with medical history significant of HTN, DM, BPH.  He had a routine colonoscopy on 6/16 with Dr. Benson Norway.  He presented to the ER on 6/17 with "gas pain" and some nausea and vomiting.  Patient resumed all his medications including diuretics (lasix/aldactone).  He also had a CT scan during that visit with IV contrast.  He never felt back to his baseline and went to see Dr. Virgina Jock on 6/22.  His BP was found to be low.  His labs showed elevated kidney function and d dimer.  He is chronically short of breath but does admit to being more short of breath than usual.  Family also states he is weaker than his norm.    ED Course: Bladder scan showed 43 ml.  Patient has not voided yet.  Cr elevated at >4.  Baseline appears to be 1.1-1.2.  X ray was negative for pulm edema.  Was found to be hypokalemia and hyponatremic.  Hospitalist were asked to admit    Review of Systems: all systems reviewed, negative unless stated above in HPI   Past Medical History  Diagnosis Date  . Diabetes mellitus type 2, uncontrolled, with complications ([place])   . Hypercholesteremia   . Benign prostate hyperplasia   . Morbid obesity with BMI of 45.0-49.9, adult ([place])   . Lichen planus   . Left knee DJD   . Shortness of breath     WITH EXERTION   . Sleep apnea, obstructive 10/14/08    PSA- [facility] AHI duuring total sleep was 61.8/hr and during REMsleep 0.0/hr RDI during total sleep was 96.1/hr. and during REM sleep at 0.0/hr. The average O2 sat range during REM and NREM was 94% The lowest O2 Sat during REM and NREM was 85%  Severe OSA /hypopnea syndrome. Notably events were worse in the supine position and during REM sleep.   Marland Kitchen Heart valve disorder     grade I  dyastolic dysfunction  . Aortic valve sclerosis   . Hypertension 10/02/11    lexiscan myoview negative for ischemia EF 46%low risk scan.  ECHO 12/07/12 No change in previous echo.  . Hernia of abdominal wall   . Early cataracts, bilateral   . Heart murmur     as a young adult  . GERD (gastroesophageal reflux disease)   . 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 Depression     medication induced    Past Surgical History  Procedure Laterality Date  . 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 Suprapubic prostatectomy    . Knee arthroscopy      RT KNEE   . Total knee arthroplasty  12/20/2012    Procedure: TOTAL KNEE ARTHROPLASTY;  Surgeon: Lorn Junes, MD;  Location: [facility];  Service: Orthopedics;  Laterality: Left;  left total knee arthroplasty  . Cardiac catheterization    . Multiple tooth extractions    . Colonoscopy w/ biopsies and polypectomy    . Orif ankle fracture Left 09/13/2014    Procedure: Open Reduction Internal Fixation Left Ankle Medial Malleolus;  Surgeon: Newt Minion, MD;  Location: [facility];  Service: Orthopedics;  Laterality: Left;  . Colonoscopy with propofol N/A 05/16/2016  Procedure: COLONOSCOPY WITH PROPOFOL;  Surgeon: Carol Ada, MD;  Location: [facility];  Service: Endoscopy;  Laterality: N/A;     reports that he quit smoking about 44 years ago. He has quit using smokeless tobacco. His smokeless tobacco use included Snuff and Chew. He reports that he drinks alcohol. He reports that he does not use illicit drugs.  No Known Allergies  Family History  Problem Relation Age of Onset  . Hypertension    . Diabetes Mellitus II    . Stroke    . Heart disease      Prior to Admission medications   Medication Sig Start Date End Date Taking? Authorizing Provider  aspirin EC 81 MG tablet Take 81 mg by mouth daily.   Yes Historical Provider, MD  B-D UF III MINI PEN NEEDLES 31G X 5 MM MISC  02/08/15  Yes Historical Provider, MD  BYETTA 10 MCG PEN 10  MCG/0.04ML SOPN injection Inject 10 mcg into the skin daily.  10/07/13  Yes Historical Provider, MD  carvedilol (COREG) 25 MG tablet Take 25 mg by mouth 2 (two) times daily with a meal.   Yes Historical Provider, MD  clobetasol ointment (TEMOVATE) [id number] % Apply 1 application topically daily as needed (for itching).  09/23/13  Yes Historical Provider, MD  cloNIDine (CATAPRES) 0.1 MG tablet Take 0.1 mg by mouth 2 (two) times daily.  04/09/15  Yes Historical Provider, MD  Cyanocobalamin 2500 MCG TABS Take 2,500 mcg by mouth daily.   Yes Historical Provider, MD  diclofenac sodium (VOLTAREN) 1 % GEL Apply 1 application topically 4 (four) times daily as needed (For pain.).   Yes Historical Provider, MD  docusate sodium 100 MG CAPS 1 tab 2 times a day while on narcotics.  STOOL SOFTENER Patient taking differently: Take 100-300 mg by mouth 2 (two) times daily as needed (for constipation).  12/23/12  Yes Kirstin Shepperson, PA-C  felodipine (PLENDIL) 5 MG 24 hr tablet Take 10 mg by mouth every morning.    Yes Historical Provider, MD  FIBER PO Take 1 capsule by mouth daily.   Yes Historical Provider, MD  furosemide (LASIX) 80 MG tablet TAKE 1 TABLET(80 MG) BY MOUTH DAILY 05/12/16  Yes Troy Sine, MD  HYDROcodone-acetaminophen (NORCO/VICODIN) 5-325 MG per tablet Take 1 tablet by mouth every 12 (twelve) hours as needed for moderate pain.  12/06/13  Yes Historical Provider, MD  loratadine (CLARITIN) 10 MG tablet Take 20 mg by mouth every morning.   Yes Historical Provider, MD  losartan (COZAAR) 100 MG tablet Take 100 tablets by mouth daily.  03/29/15  Yes Historical Provider, MD  MEGARED OMEGA-3 KRILL OIL PO Take 1 tablet by mouth daily.   Yes Historical Provider, MD  metFORMIN (GLUCOPHAGE) 1000 MG tablet Take 1,000 mg by mouth 2 (two) times daily with a meal.   Yes Historical Provider, MD  Multiple Vitamin (MULTIVITAMIN WITH MINERALS) TABS tablet Take 1 tablet by mouth daily.   Yes Historical Provider, MD  Glory Rosebush  VERIO test strip USE TO TEST BID AS DIRECTED 03/20/16  Yes Historical Provider, MD  simvastatin (ZOCOR) 40 MG tablet Take 40 mg by mouth every evening.   Yes Historical Provider, MD  spironolactone (ALDACTONE) 25 MG tablet TAKE 1 TABLET(25 MG) BY MOUTH TWICE DAILY 12/26/15  Yes Troy Sine, MD  vitamin C (ASCORBIC ACID) 500 MG tablet Take 500 mg by mouth daily.    Yes Historical Provider, MD    Physical Exam: Filed Vitals:  05/22/16 1101 05/22/16 1200  BP: 100/58 112/66  Pulse: 64 63  Temp: 98.5 F (36.9 C)   TempSrc: Oral   Resp: 18 21  SpO2: 95% 91%      Constitutional: NAD, calm, comfortable Filed Vitals:   05/22/16 1101 05/22/16 1200  BP: 100/58 112/66  Pulse: 64 63  Temp: 98.5 F (36.9 C)   TempSrc: Oral   Resp: 18 21  SpO2: 95% 91%   Eyes: PERRL, lids and conjunctivae normal ENMT: Mucous membranes are moist. Posterior pharynx clear of any exudate or lesions.Normal dentition.  Neck: normal, supple, no masses, no thyromegaly Respiratory: clear to auscultation bilaterally, no wheezing, no crackles. Normal respiratory effort. No accessory muscle use.  Cardiovascular: Regular rate and rhythm, no murmurs / rubs / gallops. Min LE edema.  Abdomen: no tenderness, no masses palpated. No hepatosplenomegaly. Bowel sounds positive.  Musculoskeletal: no clubbing / cyanosis. No joint deformity upper and lower extremities. Good ROM, no contractures. Normal muscle tone.  Neurologic: CN 2-12 grossly intact. Sensation intact, DTR normal. Strength 5/5 in all 4.  Psychiatric: Normal judgment and insight. Alert and oriented x 3. Normal mood.    Labs on Admission: I have personally reviewed following labs and imaging studies  CBC:  Recent Labs Lab 05/17/16 2214 05/22/16 1129  WBC 10.7* 10.2  HGB 13.6 11.1*  HCT 39.9 32.1*  MCV 78.2 76.4*  PLT 177 0000000   Basic Metabolic Panel:  Recent Labs Lab 05/17/16 2214 05/22/16 1129  NA 134* 125*  K 2.8* 2.9*  CL 96* 87*  CO2 27  24  GLUCOSE 184* 212*  BUN 9 42*  CREATININE 1.23 4.65*  CALCIUM 9.2 8.1*   GFR: Estimated Creatinine Clearance: 20.3 mL/min (by C-G formula based on Cr of 4.65). Liver Function Tests:  Recent Labs Lab 05/17/16 2214  AST 27  ALT 22  ALKPHOS 40  BILITOT 0.9  PROT 8.0  ALBUMIN 4.5    Recent Labs Lab 05/17/16 2214  LIPASE 22   No results for input(s): AMMONIA in the last 168 hours. Coagulation Profile: No results for input(s): INR, PROTIME in the last 168 hours. Cardiac Enzymes:  Recent Labs Lab 05/22/16 1129  TROPONINI <0.03   BNP (last 3 results) No results for input(s): PROBNP in the last 8760 hours. HbA1C: No results for input(s): HGBA1C in the last 72 hours. CBG:  Recent Labs Lab 05/16/16 1027 05/17/16 2206 05/22/16 1125  GLUCAP 127* 180* 228*   Lipid Profile: No results for input(s): CHOL, HDL, LDLCALC, TRIG, CHOLHDL, LDLDIRECT in the last 72 hours. Thyroid Function Tests: No results for input(s): TSH, T4TOTAL, FREET4, T3FREE, THYROIDAB in the last 72 hours. Anemia Panel: No results for input(s): VITAMINB12, FOLATE, FERRITIN, TIBC, IRON, RETICCTPCT in the last 72 hours. Urine analysis:    Component Value Date/Time   COLORURINE YELLOW 05/18/2016 0059   APPEARANCEUR CLEAR 05/18/2016 0059   LABSPEC 1.046* 05/18/2016 0059   PHURINE 6.0 05/18/2016 0059   GLUCOSEU NEGATIVE 05/18/2016 0059   HGBUR NEGATIVE 05/18/2016 0059   BILIRUBINUR NEGATIVE 05/18/2016 0059   KETONESUR 15* 05/18/2016 0059   PROTEINUR NEGATIVE 05/18/2016 0059   UROBILINOGEN 0.2 12/16/2012 1352   NITRITE NEGATIVE 05/18/2016 0059   LEUKOCYTESUR NEGATIVE 05/18/2016 0059   Sepsis Labs: Invalid input(s): PROCALCITONIN, LACTICIDVEN No results found for this or any previous visit (from the past 240 hour(s)).   Radiological Exams on Admission: Dg Chest 2 View  05/22/2016  CLINICAL DATA:  Shortness of breath and weakness. EXAM: CHEST  2  VIEW COMPARISON:  09/12/2014 FINDINGS: Mild  cardiac enlargement. Asymmetric elevation of the right hemidiaphragm is identified. No pleural effusion or edema identified. No airspace consolidation. IMPRESSION: 1. Asymmetric elevation of the right hemidiaphragm. 2. No acute cardiopulmonary abnormalities. Electronically Signed   By: Kerby Moors M.D.   On: 05/22/2016 12:59    EKG: Independently reviewed. sinus  Assessment/Plan Active Problems:   Diabetes mellitus type 2, uncontrolled, with complications (H[facility])   Morbid obesity with BMI of 45.0-49.9, adult ([facility])   Sleep apnea, obstructive   Hypokalemia   Acute kidney injury ([place])   Hyponatremia    AKI -suspect pre-renal -FENA -u/A pending -gentle IVF (grade 1 diastolic dsfx in [id number])  Shortness of breath -r/o PE  Elevated d dimer (outpatient lab-- 5) -V/Q scan-- heparin gtt if positive -can not have CTA  DM -SSI -hold home meds  OSA -CPAP at night  Grade 1 diastolic CHF -echo  Hyponatremia -IVF  Hypokalemia Po replacement   DVT prophylaxis: heparin Code Status: full Family Communication: daughter at bedside Disposition Plan:  Consults called:  Admission status: inpt   [place] DO [facility] Pager [phone]  If 7PM-7AM, please contact night-coverage [email or web address] Password [email or web address]  05/22/2016, 1:52 PM

## 2016-05-22 NOTE — ED Provider Notes (Signed)
CSN: [id number]     Arrival date & time 05/22/16  1038 History   First MD Initiated Contact with Patient 05/22/16 1156     Chief Complaint  Patient presents with  . Weakness  . Shortness of Breath     (Consider location/radiation/quality/duration/timing/severity/associated sxs/prior Treatment) HPI  75 year old male presents with not feeling well and sent from PCPs office for acute renal failure. Patient had a colonoscopy 6 days ago. The next day he was seen here for chest pain and feeling like he needs to burp. Seen in the ER and had a CT abdomen pelvis with contrast. He felt better. Went home and has still felt short of breath and weak and is progressively worsening. Saw PCP today and had blood work obtained. Noted to have a creatinine over 4 which is atypical for him. Told to go to the ER. I talked to Dr. Ardeth Perfect who called the ED and stated that his d-dimer was above 5. Patient states he is only short of breath on exertion. The chest symptoms have gone and he has no chest pain for the last several days. His blood pressure was relatively low for him (90s). Patient states that he held his metformin after the CT scan for the next 2 days as instructed. Has a mild cough. Patient states he had a little trouble urinating when he was at the doctor's office this morning. Otherwise has been urinating normally. His prostate has been removed.  Past Medical History  Diagnosis Date  . Diabetes mellitus type 2, uncontrolled, with complications ([place])   . Hypercholesteremia   . Benign prostate hyperplasia   . Morbid obesity with BMI of 45.0-49.9, adult ([place])   . Lichen planus   . Left knee DJD   . Shortness of breath     WITH EXERTION   . Sleep apnea, obstructive 10/14/08    PSA- [facility] AHI duuring total sleep was 61.8/hr and during REMsleep 0.0/hr RDI during total sleep was 96.1/hr. and during REM sleep at 0.0/hr. The average O2 sat range during REM and NREM was 94% The lowest  O2 Sat during REM and NREM was 85%  Severe OSA /hypopnea syndrome. Notably events were worse in the supine position and during REM sleep.   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 Aortic valve sclerosis   . Hypertension 10/02/11    lexiscan myoview negative for ischemia EF 46%low risk scan.  ECHO 12/07/12 No change in previous echo.  . Hernia of abdominal wall   . Early cataracts, bilateral   . Heart murmur     as a young adult  . GERD (gastroesophageal reflux disease)   . 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 Depression     medication induced   Past Surgical History  Procedure Laterality Date  . 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 Suprapubic prostatectomy    . Knee arthroscopy      RT KNEE   . Total knee arthroplasty  12/20/2012    Procedure: TOTAL KNEE ARTHROPLASTY;  Surgeon: Lorn Junes, MD;  Location: [facility];  Service: Orthopedics;  Laterality: Left;  left total knee arthroplasty  . Cardiac catheterization    . Multiple tooth extractions    . Colonoscopy w/ biopsies and polypectomy    . Orif ankle fracture Left 09/13/2014    Procedure: Open Reduction Internal Fixation Left Ankle Medial Malleolus;  Surgeon:  Newt Minion, MD;  Location: [facility];  Service: Orthopedics;  Laterality: Left;  . Colonoscopy with propofol N/A 05/16/2016    Procedure: COLONOSCOPY WITH PROPOFOL;  Surgeon: Carol Ada, MD;  [facility];  Service: Endoscopy;  Laterality: N/A;   Family History  Problem Relation Age of Onset  . Hypertension    . Diabetes Mellitus II    . Stroke    . Heart disease     Social History  Substance Use Topics  . Smoking status: Former Smoker -- 1.50 packs/day for 10 years    Quit date: 12/14/1971  . Smokeless tobacco: Former Systems developer    Types: Snuff, Chew  . Alcohol Use: 0.0 oz/week    0 Standard drinks or equivalent per week     Comment: 1/5 of liqour will last him about 2 weeks    Review of Systems  Constitutional:  Positive for fatigue. Negative for fever.  Respiratory: Positive for cough and shortness of breath.   Cardiovascular: Negative for chest pain.  Gastrointestinal: Positive for vomiting and diarrhea. Negative for abdominal pain.  Neurological: Positive for weakness.  All other systems reviewed and are negative.     Allergies  Review of patient's allergies indicates no known allergies.  Home Medications   Prior to Admission medications   Medication Sig Start Date End Date Taking? Authorizing Provider  aspirin EC 81 MG tablet Take 81 mg by mouth daily.   Yes Historical Provider, MD  B-D UF III MINI PEN NEEDLES 31G X 5 MM MISC  02/08/15  Yes Historical Provider, MD  BYETTA 10 MCG PEN 10 MCG/0.04ML SOPN injection Inject 10 mcg into the skin daily.  10/07/13  Yes Historical Provider, MD  carvedilol (COREG) 25 MG tablet Take 25 mg by mouth 2 (two) times daily with a meal.   Yes Historical Provider, MD  clobetasol ointment (TEMOVATE) [id number] % Apply 1 application topically daily as needed (for itching).  09/23/13  Yes Historical Provider, MD  cloNIDine (CATAPRES) 0.1 MG tablet Take 0.1 mg by mouth 2 (two) times daily.  04/09/15  Yes Historical Provider, MD  Cyanocobalamin 2500 MCG TABS Take 2,500 mcg by mouth daily.   Yes Historical Provider, MD  diclofenac sodium (VOLTAREN) 1 % GEL Apply 1 application topically 4 (four) times daily as needed (For pain.).   Yes Historical Provider, MD  docusate sodium 100 MG CAPS 1 tab 2 times a day while on narcotics.  STOOL SOFTENER Patient taking differently: Take 100-300 mg by mouth 2 (two) times daily as needed (for constipation).  12/23/12  Yes Kirstin Shepperson, PA-C  felodipine (PLENDIL) 5 MG 24 hr tablet Take 10 mg by mouth every morning.    Yes Historical Provider, MD  FIBER PO Take 1 capsule by mouth daily.   Yes Historical Provider, MD  furosemide (LASIX) 80 MG tablet TAKE 1 TABLET(80 MG) BY MOUTH DAILY 05/12/16  Yes Troy Sine, MD   HYDROcodone-acetaminophen (NORCO/VICODIN) 5-325 MG per tablet Take 1 tablet by mouth every 12 (twelve) hours as needed for moderate pain.  12/06/13  Yes Historical Provider, MD  loratadine (CLARITIN) 10 MG tablet Take 20 mg by mouth every morning.   Yes Historical Provider, MD  losartan (COZAAR) 100 MG tablet Take 100 tablets by mouth daily.  03/29/15  Yes Historical Provider, MD  MEGARED OMEGA-3 KRILL OIL PO Take 1 tablet by mouth daily.   Yes Historical Provider, MD  metFORMIN (GLUCOPHAGE) 1000 MG tablet Take 1,000 mg by mouth 2 (two) times  daily with a meal.   Yes Historical Provider, MD  Multiple Vitamin (MULTIVITAMIN WITH MINERALS) TABS tablet Take 1 tablet by mouth daily.   Yes Historical Provider, MD  Glory Rosebush VERIO test strip USE TO TEST BID AS DIRECTED 03/20/16  Yes Historical Provider, MD  simvastatin (ZOCOR) 40 MG tablet Take 40 mg by mouth every evening.   Yes Historical Provider, MD  spironolactone (ALDACTONE) 25 MG tablet TAKE 1 TABLET(25 MG) BY MOUTH TWICE DAILY 12/26/15  Yes Troy Sine, MD  vitamin C (ASCORBIC ACID) 500 MG tablet Take 500 mg by mouth daily.    Yes Historical Provider, MD   BP 112/66 mmHg  Pulse 63  Temp(Src) 98.5 F (36.9 C) (Oral)  Resp 21  SpO2 91% Physical Exam  Constitutional: He is oriented to person, place, and time. He appears well-developed and well-nourished. No distress.  Morbidly obese  HENT:  Head: Normocephalic and atraumatic.  Right Ear: External ear normal.  Left Ear: External ear normal.  Nose: Nose normal.  Eyes: Right eye exhibits no discharge. Left eye exhibits no discharge.  Neck: Neck supple.  Cardiovascular: Normal rate, regular rhythm, normal heart sounds and intact distal pulses.   Pulmonary/Chest: Effort normal and breath sounds normal. He has no wheezes. He has no rales.  Abdominal: Soft. He exhibits no distension. There is no tenderness.  Musculoskeletal: He exhibits edema (1+ pitting edema, BLE).  Neurological: He is alert  and oriented to person, place, and time.  Skin: Skin is warm and dry. He is not diaphoretic.  Nursing note and vitals reviewed.   ED Course  Procedures (including critical care time) Labs Review Labs Reviewed  BASIC METABOLIC PANEL - Abnormal; Notable for the following:    Sodium 125 (*)    Potassium 2.9 (*)    Chloride 87 (*)    Glucose, Bld 212 (*)    BUN 42 (*)    Creatinine, Ser 4.65 (*)    Calcium 8.1 (*)    GFR calc non Af Amer 11 (*)    GFR calc Af Amer 13 (*)    All other components within normal limits  CBC - Abnormal; Notable for the following:    RBC 4.20 (*)    Hemoglobin 11.1 (*)    HCT 32.1 (*)    MCV 76.4 (*)    All other components within normal limits  CBG MONITORING, ED - Abnormal; Notable for the following:    Glucose-Capillary 228 (*)    All other components within normal limits  URINALYSIS, ROUTINE W REFLEX MICROSCOPIC (NOT AT [facility])  TROPONIN I  I-STAT CG4 LACTIC ACID, ED    Imaging Review Dg Chest 2 View  05/22/2016  CLINICAL DATA:  Shortness of breath and weakness. EXAM: CHEST  2 VIEW COMPARISON:  09/12/2014 FINDINGS: Mild cardiac enlargement. Asymmetric elevation of the right hemidiaphragm is identified. No pleural effusion or edema identified. No airspace consolidation. IMPRESSION: 1. Asymmetric elevation of the right hemidiaphragm. 2. No acute cardiopulmonary abnormalities. Electronically Signed   By: Kerby Moors M.D.   On: 05/22/2016 12:59   Nm Pulmonary Perf And Vent  05/22/2016  CLINICAL DATA:  Shortness of breath and elevated D-dimer. Acute renal failure with contraindication to contrast administration. EXAM: NUCLEAR MEDICINE VENTILATION - PERFUSION LUNG SCAN TECHNIQUE: Ventilation images were obtained in multiple projections using inhaled aerosol Tc-77m DTPA. Perfusion images were obtained in multiple projections after intravenous injection of Tc-32m MAA. RADIOPHARMACEUTICALS:  30.9 mCi Technetium-31m DTPA aerosol inhalation and 4.4 mCi  Technetium-15m  MAA IV COMPARISON:  Chest x-ray earlier today. FINDINGS: Ventilation: No significant ventilatory defects. There is some retention of aerosol in the central airways. This may reflect poor ventilation. There is elevation of the right hemidiaphragm causing reduced right lung volume. Perfusion: No significant subsegmental or segmental perfusion defects are identified. IMPRESSION: Low probability for acute pulmonary embolism. Although the ventilatory study is relatively poor, the perfusion study shows no significant perfusion defects. Electronically Signed   By: Aletta Edouard M.D.   On: 05/22/2016 17:09   I have personally reviewed and evaluated these images and lab results as part of my medical decision-making.   EKG Interpretation None      MDM   Final diagnoses:  Positive D dimer  AKI (acute kidney injury) ([place])    Patient's acute renal failure is probably prerenal. No infectious symptoms currently. He is having decreased urine output the bladder scan shows minimal urine, indicating he likely needs more fluids. Does have some soft blood pressures, especially relative to his chronic hypertension. No signs of acute CHF. Patient will need a VQ scan given positive d-dimer as an outpatient. He'll be admitted to the hospitalist for fluid resuscitation and workup of kidney failure. He is currently stable.    Sherwood Gambler, MD 05/22/16 [phone]

## 2016-05-22 NOTE — Care Management Note (Signed)
Case Management Note  Patient Details  Name: Bradley Alvarez MRN: [id number] Date of Birth: 1941-07-01  Subjective/Objective: 75 y/o m admitted w/AKI. From home.Will f/u on med asst issue.                   Action/Plan:d/c home.   Expected Discharge Date:   (unknown)               Expected Discharge Plan:  Home/Self Care  In-House Referral:     Discharge planning Services  CM Consult  Post Acute Care Choice:    Choice offered to:     DME Arranged:    DME Agency:     HH Arranged:    HH Agency:     Status of Service:  In process, will continue to follow  If discussed at Long Length of Stay Meetings, dates discussed:    Additional Comments:  Dessa Phi, RN 05/22/2016, 4:04 PM

## 2016-05-22 NOTE — Progress Notes (Signed)
Noted Cm consult for Pt having trouble affording medications. Please provide assistance. ED CM spoke with pt who does confirm issues with cost of medications (not any particular medication) because of not having part D medicare Cm discussed with pt the importance of getting part D to cover his medications during the next active enrollment period Informed him he will generally receive a notice in the mail  Discussed options of obtaining special medicaid coverage for his medication Provided him and his family at bedside (2 females and a male) the medicaid website [email or web address] Discussed [facility] 2017 formulary to verify he is only getting prescriptions for preferred meds not non preferred ones on the list  Discussed the [facility] website and customer service number to ask also if there is an [facility]c medication assistance recommendation until part D medicare obtained  Discussed needymeds.org to use to find possible discounts or drug pt assistance program for individual medications Discussed no program at [facility] to assist an insured [facility] pt with medications  Pt and family voiced understanding

## 2016-05-22 NOTE — ED Notes (Signed)
Pt was at [facility] MD's office, hasn't been feeling well over the weekend. Had low BP in office, felt SOB, kidney function elevated in office. Pt denies pain, feels weak and SOB. Is a diabetic.

## 2016-05-23 ENCOUNTER — Inpatient Hospital Stay (HOSPITAL_COMMUNITY): Payer: Medicare Other

## 2016-05-23 DIAGNOSIS — R0600 Dyspnea, unspecified: Secondary | ICD-10-CM

## 2016-05-23 DIAGNOSIS — G4733 Obstructive sleep apnea (adult) (pediatric): Secondary | ICD-10-CM

## 2016-05-23 LAB — ECHOCARDIOGRAM COMPLETE
E/e' ratio: 8.26
EWDT: 310 ms
FS: 28 % (ref 28–44)
HEIGHTINCHES: 74 in
IVS/LV PW RATIO, ED: 1.09
LA ID, A-P, ES: 51 mm
LA diam index: 1.96 cm/m2
LA vol A4C: 112 ml
LA vol index: 58.1 mL/m2
LA vol: 151 mL
LDCA: 3.8 cm2
LEFT ATRIUM END SYS DIAM: 51 mm
LV TDI E'LATERAL: 8.05
LV e' LATERAL: 8.05 cm/s
LVEEAVG: 8.26
LVEEMED: 8.26
LVOTD: 22 mm
MV Dec: 310
MV pk A vel: 118 m/s
MVPKEVEL: 66.5 m/s
PW: 18.1 mm — AB (ref 0.6–1.1)
TDI e' medial: 8.63
WEIGHTICAEL: 4880 [oz_av]

## 2016-05-23 LAB — BASIC METABOLIC PANEL
ANION GAP: 12 (ref 5–15)
BUN: 37 mg/dL — ABNORMAL HIGH (ref 6–20)
CALCIUM: 8.2 mg/dL — AB (ref 8.9–10.3)
CO2: 26 mmol/L (ref 22–32)
Chloride: 94 mmol/L — ABNORMAL LOW (ref 101–111)
Creatinine, Ser: 3.32 mg/dL — ABNORMAL HIGH (ref 0.61–1.24)
GFR calc non Af Amer: 17 mL/min — ABNORMAL LOW (ref >60)
GFR, EST AFRICAN AMERICAN: 19 mL/min — AB (ref >60)
Glucose, Bld: 142 mg/dL — ABNORMAL HIGH (ref 65–99)
Potassium: 3 mmol/L — ABNORMAL LOW (ref 3.5–5.1)
Sodium: 132 mmol/L — ABNORMAL LOW (ref 135–145)

## 2016-05-23 LAB — GLUCOSE, CAPILLARY
GLUCOSE-CAPILLARY: 136 mg/dL — AB (ref 65–99)
GLUCOSE-CAPILLARY: 193 mg/dL — AB (ref 65–99)
Glucose-Capillary: 184 mg/dL — ABNORMAL HIGH (ref 65–99)
Glucose-Capillary: 203 mg/dL — ABNORMAL HIGH (ref 65–99)

## 2016-05-23 LAB — CBC
HCT: 30.9 % — ABNORMAL LOW (ref 39.0–52.0)
HEMOGLOBIN: 10.5 g/dL — AB (ref 13.0–17.0)
MCH: 26 pg (ref 26.0–34.0)
MCHC: 34 g/dL (ref 30.0–36.0)
MCV: 76.5 fL — ABNORMAL LOW (ref 78.0–100.0)
Platelets: 195 10*3/uL (ref 150–400)
RBC: 4.04 MIL/uL — AB (ref 4.22–5.81)
RDW: 13.5 % (ref 11.5–15.5)
WBC: 8.3 10*3/uL (ref 4.0–10.5)

## 2016-05-23 LAB — TROPONIN I: Troponin I: 0.03 ng/mL (ref <0.031)

## 2016-05-23 LAB — PROTEIN, URINE, RANDOM: Total Protein, Urine: 37 mg/dL

## 2016-05-23 LAB — MAGNESIUM: Magnesium: 1.5 mg/dL — ABNORMAL LOW (ref 1.7–2.4)

## 2016-05-23 LAB — SODIUM, URINE, RANDOM: Sodium, Ur: 28 mmol/L

## 2016-05-23 LAB — CREATININE, URINE, RANDOM: Creatinine, Urine: 78.56 mg/dL

## 2016-05-23 LAB — PHOSPHORUS: Phosphorus: 3.4 mg/dL (ref 2.5–4.6)

## 2016-05-23 MED ORDER — MAGNESIUM SULFATE 2 GM/50ML IV SOLN
2.0000 g | Freq: Once | INTRAVENOUS | Status: AC
  Administered 2016-05-23: 2 g via INTRAVENOUS
  Filled 2016-05-23: qty 50

## 2016-05-23 MED ORDER — POTASSIUM CHLORIDE 20 MEQ/15ML (10%) PO SOLN
40.0000 meq | Freq: Once | ORAL | Status: AC
  Administered 2016-05-23: 40 meq via ORAL
  Filled 2016-05-23: qty 30

## 2016-05-23 NOTE — Progress Notes (Signed)
  Echocardiogram 2D Echocardiogram has been performed.  Bradley Alvarez 05/23/2016, 1:43 PM

## 2016-05-23 NOTE — Progress Notes (Signed)
PROGRESS NOTE    Bradley Alvarez  [id number] DOB: [birth year] DOA: 05/22/2016 PCP: Precious Reel, MD  Outpatient Specialists:   Brief Narrative: 75 y.o. male with medical history significant of HTN, DM, BPH. He had a routine colonoscopy on 6/16 with Dr. Benson Norway. He presented to the ER on 6/17 with "gas pain" and some nausea and vomiting. Patient resumed all his medications including diuretics (lasix/aldactone). He also had a CT scan during that visit with IV contrast. He never felt back to his baseline and went to see Dr. Virgina Jock on 6/22. His BP was found to be low. His labs showed elevated kidney function and d dimer. He is chronically short of breath but does admit to being more short of breath than usual. Family also states he is weaker than his norm.   Assessment & Plan:   Active Problems:   Diabetes mellitus type 2, uncontrolled, with complications ([place])   Morbid obesity with BMI of 45.0-49.9, adult (HCC)   Sleep apnea, obstructive   Hypokalemia   Acute kidney injury ([place])   Hyponatremia  - AKI, possible pre renal versus contrast induced nephropathy versus ATN versus combined. Serum creatinine is improving with IVF which would favor pre renal AKI. Will check urine sodium, urine creatinine, urine protein and urine bicarb (if feasible as patient was on diuretics). Renal ultrasound is non revealing. Further management will depend on hospital course. Continue IVF for now. 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 - DM - Optimize. -OSA - CPAP at night - Hyponatremia - Possibly pre renal as well. History of diarrhea prior to admission, and patient was on diuretics. - Hypomagnesemia - Repeat levels. Replete. -  Hypokalemia - Replete. Monitor renal function and electrolytes closely.  DVT prophylaxis: heparin Code Status: full Family Communication: daughter at bedside Disposition Plan:  Consults called:  Admission status: inpt  Subjective: No new complaints. No  nausea or vomiting. Diarrhea has resolved. Renal function is slowly improving.  Objective: Filed Vitals:   05/22/16 2123 05/22/16 2219 05/23/16 0535 05/23/16 0700  BP: 125/76  129/68   Pulse: 57 62 60   Temp: 98 F (36.7 C)  98.4 F (36.9 C)   TempSrc: Oral  Oral   Resp: 20 16 18    Height:    6\' 2"  (1.88 m)  Weight:    138.347 kg (305 lb)  SpO2: 99% 95% 97%     Intake/Output Summary (Last 24 hours) at 05/23/16 0952 Last data filed at 05/23/16 0810  Gross per 24 hour  Intake   1533 ml  Output   2650 ml  Net  -1117 ml   Filed Weights   05/23/16 0700  Weight: 138.347 kg (305 lb)    Examination:  General exam: Obese. Appears calm and comfortable  HEENT - Pallor, no jaundice. Dry buccal mucosa Respiratory system: Clear to auscultation. Respiratory effort normal. Cardiovascular system: S1 & S2.  Gastrointestinal system: Abdomen is nondistended, soft and nontender.  Central nervous system: Alert and oriented. Moves all limbs.N  Extremities: No leg edema..  Data Reviewed: I have personally reviewed following labs and imaging studies  CBC:  Recent Labs Lab 05/17/16 2214 05/22/16 1129 05/23/16 0334  WBC 10.7* 10.2 8.3  HGB 13.6 11.1* 10.5*  HCT 39.9 32.1* 30.9*  MCV 78.2 76.4* 76.5*  PLT 177 176 0000000   Basic Metabolic Panel:  Recent Labs Lab 05/17/16 2214 05/22/16 1129 05/22/16 1548 05/23/16 0334  NA 134* 125*  --  132*  K 2.8* 2.9*  --  3.0*  CL 96* 87*  --  94*  CO2 27 24  --  26  GLUCOSE 184* 212*  --  142*  BUN 9 42*  --  37*  CREATININE 1.23 4.65*  --  3.32*  CALCIUM 9.2 8.1*  --  8.2*  MG  --   --  1.4*  --    GFR: Estimated Creatinine Clearance: 28.4 mL/min (by C-G formula based on Cr of 3.32). Liver Function Tests:  Recent Labs Lab 05/17/16 2214  AST 27  ALT 22  ALKPHOS 40  BILITOT 0.9  PROT 8.0  ALBUMIN 4.5    Recent Labs Lab 05/17/16 2214  LIPASE 22   No results for input(s): AMMONIA in the last 168 hours. Coagulation  Profile: No results for input(s): INR, PROTIME in the last 168 hours. Cardiac Enzymes:  Recent Labs Lab 05/22/16 1129 05/22/16 1548 05/22/16 2117 05/23/16 0334  TROPONINI <0.03 <0.03 <0.03 0.03   BNP (last 3 results) No results for input(s): PROBNP in the last 8760 hours. HbA1C: No results for input(s): HGBA1C in the last 72 hours. CBG:  Recent Labs Lab 05/17/16 2206 05/22/16 1125 05/22/16 1725 05/22/16 2118 05/23/16 0738  GLUCAP 180* 228* 167* 193* 136*   Lipid Profile: No results for input(s): CHOL, HDL, LDLCALC, TRIG, CHOLHDL, LDLDIRECT in the last 72 hours. Thyroid Function Tests: No results for input(s): TSH, T4TOTAL, FREET4, T3FREE, THYROIDAB in the last 72 hours. Anemia Panel: No results for input(s): VITAMINB12, FOLATE, FERRITIN, TIBC, IRON, RETICCTPCT in the last 72 hours. Urine analysis:    Component Value Date/Time   COLORURINE YELLOW 05/22/2016 1735   APPEARANCEUR CLOUDY* 05/22/2016 1735   LABSPEC 1.009 05/22/2016 1735   PHURINE 5.0 05/22/2016 1735   GLUCOSEU NEGATIVE 05/22/2016 1735   HGBUR TRACE* 05/22/2016 1735   BILIRUBINUR NEGATIVE 05/22/2016 1735   KETONESUR NEGATIVE 05/22/2016 1735   PROTEINUR NEGATIVE 05/22/2016 1735   UROBILINOGEN 0.2 12/16/2012 1352   NITRITE NEGATIVE 05/22/2016 1735   LEUKOCYTESUR NEGATIVE 05/22/2016 1735   Sepsis Labs: @LABRCNTIP (procalcitonin:4,lacticidven:4)  )No results found for this or any previous visit (from the past 240 hour(s)).       Radiology Studies: Dg Chest 2 View  05/22/2016  CLINICAL DATA:  Shortness of breath and weakness. EXAM: CHEST  2 VIEW COMPARISON:  09/12/2014 FINDINGS: Mild cardiac enlargement. Asymmetric elevation of the right hemidiaphragm is identified. No pleural effusion or edema identified. No airspace consolidation. IMPRESSION: 1. Asymmetric elevation of the right hemidiaphragm. 2. No acute cardiopulmonary abnormalities. Electronically Signed   By: Kerby Moors M.D.   On: 05/22/2016  12:59   US Renal  05/22/2016  CLINICAL DATA:  Acute kidney injury EXAM: RENAL / URINARY TRACT ULTRASOUND COMPLETE COMPARISON:  None. FINDINGS: Right Kidney: Length: 12.2 cm. Echogenicity within normal limits. No mass or hydronephrosis visualized. Left Kidney: Length: 11.7 cm. Echogenicity within normal limits. No mass or hydronephrosis visualized. Bladder: Appears normal for degree of bladder distention. IMPRESSION: Normal renal ultrasound. Electronically Signed   By: Kathreen Devoid   On: 05/22/2016 20:37   Nm Pulmonary Perf And Vent  05/22/2016  CLINICAL DATA:  Shortness of breath and elevated D-dimer. Acute renal failure with contraindication to contrast administration. EXAM: NUCLEAR MEDICINE VENTILATION - PERFUSION LUNG SCAN TECHNIQUE: Ventilation images were obtained in multiple projections using inhaled aerosol Tc-91m DTPA. Perfusion images were obtained in multiple projections after intravenous injection of Tc-41m MAA. RADIOPHARMACEUTICALS:  30.9 mCi Technetium-27m DTPA aerosol inhalation and 4.4 mCi Technetium-87m MAA IV COMPARISON:  Chest x-ray earlier  today. FINDINGS: Ventilation: No significant ventilatory defects. There is some retention of aerosol in the central airways. This may reflect poor ventilation. There is elevation of the right hemidiaphragm causing reduced right lung volume. Perfusion: No significant subsegmental or segmental perfusion defects are identified. IMPRESSION: Low probability for acute pulmonary embolism. Although the ventilatory study is relatively poor, the perfusion study shows no significant perfusion defects. Electronically Signed   By: Aletta Edouard M.D.   On: 05/22/2016 17:09        Scheduled Meds: . heparin  5,000 Units Subcutaneous Q8H  . insulin aspart  0-5 Units Subcutaneous QHS  . insulin aspart  0-9 Units Subcutaneous TID WC  . magnesium sulfate 1 - 4 g bolus IVPB  2 g Intravenous Once  . potassium chloride  40 mEq Oral Once  . sodium chloride flush  3  mL Intravenous Q12H   Continuous Infusions: . sodium chloride 1,000 mL (05/23/16 0516)     LOS: 1 day    Time spent: 30 Minutes.    Dana Allan, MD  [facility] Pager #: [phone] 7PM-7AM contact night coverage as above

## 2016-05-24 DIAGNOSIS — I10 Essential (primary) hypertension: Secondary | ICD-10-CM

## 2016-05-24 DIAGNOSIS — E118 Type 2 diabetes mellitus with unspecified complications: Secondary | ICD-10-CM

## 2016-05-24 DIAGNOSIS — N179 Acute kidney failure, unspecified: Secondary | ICD-10-CM

## 2016-05-24 DIAGNOSIS — E1165 Type 2 diabetes mellitus with hyperglycemia: Secondary | ICD-10-CM

## 2016-05-24 DIAGNOSIS — E876 Hypokalemia: Secondary | ICD-10-CM

## 2016-05-24 LAB — RENAL FUNCTION PANEL
Albumin: 3.4 g/dL — ABNORMAL LOW (ref 3.5–5.0)
Anion gap: 9 (ref 5–15)
BUN: 25 mg/dL — ABNORMAL HIGH (ref 6–20)
CO2: 27 mmol/L (ref 22–32)
Calcium: 8.8 mg/dL — ABNORMAL LOW (ref 8.9–10.3)
Chloride: 100 mmol/L — ABNORMAL LOW (ref 101–111)
Creatinine, Ser: 1.9 mg/dL — ABNORMAL HIGH (ref 0.61–1.24)
GFR calc Af Amer: 38 mL/min — ABNORMAL LOW (ref >60)
GFR calc non Af Amer: 33 mL/min — ABNORMAL LOW (ref >60)
Glucose, Bld: 165 mg/dL — ABNORMAL HIGH (ref 65–99)
Phosphorus: 2.9 mg/dL (ref 2.5–4.6)
Potassium: 3.1 mmol/L — ABNORMAL LOW (ref 3.5–5.1)
Sodium: 136 mmol/L (ref 135–145)

## 2016-05-24 LAB — GLUCOSE, CAPILLARY
GLUCOSE-CAPILLARY: 228 mg/dL — AB (ref 65–99)
GLUCOSE-CAPILLARY: 209 mg/dL — AB (ref 65–99)
GLUCOSE-CAPILLARY: 188 mg/dL — AB (ref 65–99)
Glucose-Capillary: 173 mg/dL — ABNORMAL HIGH (ref 65–99)

## 2016-05-24 MED ORDER — CARVEDILOL 25 MG PO TABS
25.0000 mg | ORAL_TABLET | Freq: Two times a day (BID) | ORAL | Status: DC
  Administered 2016-05-24 – 2016-05-25 (×2): 25 mg via ORAL
  Filled 2016-05-24 (×2): qty 1

## 2016-05-24 MED ORDER — POTASSIUM CHLORIDE CRYS ER 20 MEQ PO TBCR
40.0000 meq | EXTENDED_RELEASE_TABLET | Freq: Once | ORAL | Status: AC
  Administered 2016-05-24: 40 meq via ORAL
  Filled 2016-05-24: qty 2

## 2016-05-24 MED ORDER — FELODIPINE ER 10 MG PO TB24
10.0000 mg | ORAL_TABLET | Freq: Every morning | ORAL | Status: DC
  Administered 2016-05-24: 10 mg via ORAL
  Filled 2016-05-24 (×2): qty 1

## 2016-05-24 MED ORDER — CLONIDINE HCL 0.1 MG PO TABS
0.1000 mg | ORAL_TABLET | Freq: Every day | ORAL | Status: DC
  Administered 2016-05-24: 0.1 mg via ORAL
  Filled 2016-05-24: qty 1

## 2016-05-24 MED ORDER — HYDRALAZINE HCL 20 MG/ML IJ SOLN
10.0000 mg | Freq: Once | INTRAMUSCULAR | Status: AC
  Administered 2016-05-24: 10 mg via INTRAVENOUS
  Filled 2016-05-24: qty 1

## 2016-05-24 NOTE — Progress Notes (Signed)
BP 175/75 this am, rechecked manually and got 162/76. On-call paged, 10 mg hydralazine ordered, BP came down to 156/76.

## 2016-05-24 NOTE — Progress Notes (Signed)
[facility] PROGRESS NOTE  Bradley Alvarez [id number] DOB: [birth year] DOA: 05/22/2016  PCP: Precious Reel, MD  Brief HPI: 75 year old male with a past medical history of hypertension, diabetes, BPH presented with complaints of nausea, vomiting, malaise. Patient was found to have acute renal failure. Patient had undergone a colonoscopy on June 16 with removal of polyps. He underwent a CT scan subsequently due to abdominal pain which was unremarkable. And then presented with the above issues. He was hospitalized for further management.  Past medical history:  Past Medical History  Diagnosis Date  . Diabetes mellitus type 2, uncontrolled, with complications ([place])   . Hypercholesteremia   . Benign prostate hyperplasia   . Morbid obesity with BMI of 45.0-49.9, adult ([place])   . Lichen planus   . Left knee DJD   . Shortness of breath     WITH EXERTION   . Sleep apnea, obstructive 10/14/08    PSA- [facility] AHI duuring total sleep was 61.8/hr and during REMsleep 0.0/hr RDI during total sleep was 96.1/hr. and during REM sleep at 0.0/hr. The average O2 sat range during REM and NREM was 94% The lowest O2 Sat during REM and NREM was 85%  Severe OSA /hypopnea syndrome. Notably events were worse in the supine position and during REM sleep.   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 Aortic valve sclerosis   . Hypertension 10/02/11    lexiscan myoview negative for ischemia EF 46%low risk scan.  ECHO 12/07/12 No change in previous echo.  . Hernia of abdominal wall   . Early cataracts, bilateral   . Heart murmur     as a young adult  . GERD (gastroesophageal reflux disease)   . 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 Depression     medication induced    Consultants: None  Procedures: None  Antibiotics: None  Subjective: Patient feels much better this morning. His wife is at the bedside. He was concerned about his elevated blood pressure.  Denies any nausea, vomiting. No abdominal pain.  Objective:  Vital Signs  Filed Vitals:   05/23/16 2121 05/24/16 0344 05/24/16 0609 05/24/16 0659  BP: 146/71 175/75 162/76 156/76  Pulse: 59 63    Temp: 97.7 F (36.5 C) 97.5 F (36.4 C)    TempSrc: Oral Oral    Resp: 18 16    Height:      Weight:      SpO2: 99% 97%      Intake/Output Summary (Last 24 hours) at 05/24/16 1228 Last data filed at 05/24/16 1148  Gross per 24 hour  Intake   2640 ml  Output   1750 ml  Net    890 ml   Filed Weights   05/23/16 0700  Weight: 138.347 kg (305 lb)    General appearance: alert, cooperative, appears stated age and no distress Resp: clear to auscultation bilaterally Cardio: regular rate and rhythm, S1, S2 normal, no murmur, click, rub or gallop GI: soft, non-tender; bowel sounds normal; no masses,  no organomegaly Extremities: extremities normal, atraumatic, no cyanosis or edema Neurologic: Alert and oriented X 3, normal strength and tone. No focal deficits  Lab Results:  Data Reviewed: I have personally reviewed following labs and imaging studies  CBC:  Recent Labs Lab 05/17/16 2214 05/22/16 1129 05/23/16 0334  WBC 10.7* 10.2 8.3  HGB 13.6 11.1* 10.5*  HCT 39.9 32.1* 30.9*  MCV 78.2  76.4* 76.5*  PLT 177 176 0000000   Basic Metabolic Panel:  Recent Labs Lab 05/17/16 2214 05/22/16 1129 05/22/16 1548 05/23/16 0334 05/24/16 0517  NA 134* 125*  --  132* 136  K 2.8* 2.9*  --  3.0* 3.1*  CL 96* 87*  --  94* 100*  CO2 27 24  --  26 27  GLUCOSE 184* 212*  --  142* 165*  BUN 9 42*  --  37* 25*  CREATININE 1.23 4.65*  --  3.32* 1.90*  CALCIUM 9.2 8.1*  --  8.2* 8.8*  MG  --   --  1.4* 1.5*  --   PHOS  --   --   --  3.4 2.9   GFR: Estimated Creatinine Clearance: 49.7 mL/min (by C-G formula based on Cr of 1.9).  Liver Function Tests:  Recent Labs Lab 05/17/16 2214 05/24/16 0517  AST 27  --   ALT 22  --   ALKPHOS 40  --   BILITOT 0.9  --   PROT 8.0  --     ALBUMIN 4.5 3.4*    Recent Labs Lab 05/17/16 2214  LIPASE 22   Cardiac Enzymes:  Recent Labs Lab 05/22/16 1129 05/22/16 1548 05/22/16 2117 05/23/16 0334  TROPONINI <0.03 <0.03 <0.03 0.03   CBG:  Recent Labs Lab 05/23/16 1123 05/23/16 1640 05/23/16 2117 05/24/16 0722 05/24/16 1137  GLUCAP 184* 193* 203* 173* 228*   Urine analysis:    Component Value Date/Time   COLORURINE YELLOW 05/22/2016 1735   APPEARANCEUR CLOUDY* 05/22/2016 1735   LABSPEC 1.009 05/22/2016 1735   PHURINE 5.0 05/22/2016 1735   GLUCOSEU NEGATIVE 05/22/2016 1735   HGBUR TRACE* 05/22/2016 1735   BILIRUBINUR NEGATIVE 05/22/2016 1735   KETONESUR NEGATIVE 05/22/2016 1735   PROTEINUR NEGATIVE 05/22/2016 1735   UROBILINOGEN 0.2 12/16/2012 1352   NITRITE NEGATIVE 05/22/2016 1735   LEUKOCYTESUR NEGATIVE 05/22/2016 1735     Radiology Studies: Dg Chest 2 View  05/22/2016  CLINICAL DATA:  Shortness of breath and weakness. EXAM: CHEST  2 VIEW COMPARISON:  09/12/2014 FINDINGS: Mild cardiac enlargement. Asymmetric elevation of the right hemidiaphragm is identified. No pleural effusion or edema identified. No airspace consolidation. IMPRESSION: 1. Asymmetric elevation of the right hemidiaphragm. 2. No acute cardiopulmonary abnormalities. Electronically Signed   By: Kerby Moors M.D.   On: 05/22/2016 12:59   US Renal  05/22/2016  CLINICAL DATA:  Acute kidney injury EXAM: RENAL / URINARY TRACT ULTRASOUND COMPLETE COMPARISON:  None. FINDINGS: Right Kidney: Length: 12.2 cm. Echogenicity within normal limits. No mass or hydronephrosis visualized. Left Kidney: Length: 11.7 cm. Echogenicity within normal limits. No mass or hydronephrosis visualized. Bladder: Appears normal for degree of bladder distention. IMPRESSION: Normal renal ultrasound. Electronically Signed   By: Kathreen Devoid   On: 05/22/2016 20:37   Nm Pulmonary Perf And Vent  05/22/2016  CLINICAL DATA:  Shortness of breath and elevated D-dimer. Acute  renal failure with contraindication to contrast administration. EXAM: NUCLEAR MEDICINE VENTILATION - PERFUSION LUNG SCAN TECHNIQUE: Ventilation images were obtained in multiple projections using inhaled aerosol Tc-20m DTPA. Perfusion images were obtained in multiple projections after intravenous injection of Tc-83m MAA. RADIOPHARMACEUTICALS:  30.9 mCi Technetium-6m DTPA aerosol inhalation and 4.4 mCi Technetium-7m MAA IV COMPARISON:  Chest x-ray earlier today. FINDINGS: Ventilation: No significant ventilatory defects. There is some retention of aerosol in the central airways. This may reflect poor ventilation. There is elevation of the right hemidiaphragm causing reduced right lung volume. Perfusion: No significant subsegmental  or segmental perfusion defects are identified. IMPRESSION: Low probability for acute pulmonary embolism. Although the ventilatory study is relatively poor, the perfusion study shows no significant perfusion defects. Electronically Signed   By: Aletta Edouard M.D.   On: 05/22/2016 17:09     Medications:  Scheduled: . carvedilol  25 mg Oral BID WC  . felodipine  10 mg Oral q morning - 10a  . heparin  5,000 Units Subcutaneous Q8H  . insulin aspart  0-5 Units Subcutaneous QHS  . insulin aspart  0-9 Units Subcutaneous TID WC  . sodium chloride flush  3 mL Intravenous Q12H   Continuous: . sodium chloride 50 mL/hr at 05/24/16 1024   [id number] **OR** acetaminophen, HYDROcodone-acetaminophen, technetium albumin aggregated, technetium TC 35M diethylenetriame-pentaacetic acid  Assessment/Plan:  Active Problems:   Diabetes mellitus type 2, uncontrolled, with complications ([place])   Morbid obesity with BMI of 45.0-49.9, adult (HCC)   Sleep apnea, obstructive   Hypokalemia   Acute kidney injury ([place])   Hyponatremia    Acute kidney injury. Differential diagnoses include prerenal, versus contrast-induced, ATN or likely a combination of factors. Patient was given IV  fluids. Renal ultrasound did not show any hydronephrosis. His renal function is improving. He has good urine output. Continue current management for now. Cut back on fluids.  Dyspnea Currently resolved. He did have elevated d-dimer, however, VQ scan was low probability. Chest x-ray did not show any acute abnormalities.  Diabetes mellitus type 2 Sliding scale insulin coverage. Check CBGs.  History of obstructive sleep apnea. Continue CPAP at night.  Hyponatremia and hypokalemia Sodium levels have improved with IV hydration. Replace potassium. Magnesium was also low and this was also repleted.  History of essential hypertension. Blood pressure was elevated this morning. His antihypertensive agents were held due to initial low blood pressure and renal failure. Okay to resume Coreg and felodipine. Continue to hold valsartan and diuretics. Patient was also on clonidine which can also be resumed to avoid rebound hypertension.  DVT Prophylaxis: Subcutaneous heparin    Code Status: Full code  Family Communication: Discussed with the patient and his wife  Disposition Plan: Mobilize. Anticipate discharge tomorrow.    LOS: 2 days   [facility] Pager [phone] 05/24/2016, 12:28 PM  If 7PM-7AM, please contact night-coverage at [email or web address], password [facility]

## 2016-05-24 NOTE — Progress Notes (Signed)
Patient has been wearing on and off. Patient is tolerating well. RT will continue to monitor.

## 2016-05-25 LAB — GLUCOSE, CAPILLARY: GLUCOSE-CAPILLARY: 177 mg/dL — AB (ref 65–99)

## 2016-05-25 LAB — CBC
HCT: 31.1 % — ABNORMAL LOW (ref 39.0–52.0)
Hemoglobin: 10.7 g/dL — ABNORMAL LOW (ref 13.0–17.0)
MCH: 26.4 pg (ref 26.0–34.0)
MCHC: 34.4 g/dL (ref 30.0–36.0)
MCV: 76.6 fL — ABNORMAL LOW (ref 78.0–100.0)
PLATELETS: 250 10*3/uL (ref 150–400)
RBC: 4.06 MIL/uL — ABNORMAL LOW (ref 4.22–5.81)
RDW: 13.6 % (ref 11.5–15.5)
WBC: 10.1 10*3/uL (ref 4.0–10.5)

## 2016-05-25 LAB — BASIC METABOLIC PANEL
ANION GAP: 9 (ref 5–15)
BUN: 13 mg/dL (ref 6–20)
CALCIUM: 8.8 mg/dL — AB (ref 8.9–10.3)
CO2: 27 mmol/L (ref 22–32)
CREATININE: 1.38 mg/dL — AB (ref 0.61–1.24)
Chloride: 100 mmol/L — ABNORMAL LOW (ref 101–111)
GFR, EST AFRICAN AMERICAN: 56 mL/min — AB (ref >60)
GFR, EST NON AFRICAN AMERICAN: 48 mL/min — AB (ref >60)
Glucose, Bld: 173 mg/dL — ABNORMAL HIGH (ref 65–99)
Potassium: 3.4 mmol/L — ABNORMAL LOW (ref 3.5–5.1)
SODIUM: 136 mmol/L (ref 135–145)

## 2016-05-25 MED ORDER — POTASSIUM CHLORIDE CRYS ER 20 MEQ PO TBCR
40.0000 meq | EXTENDED_RELEASE_TABLET | Freq: Once | ORAL | Status: AC
  Administered 2016-05-25: 40 meq via ORAL
  Filled 2016-05-25: qty 2

## 2016-05-25 NOTE — Discharge Instructions (Signed)
Acute Kidney Injury °Acute kidney injury is any condition in which there is sudden (acute) damage to the kidneys. Acute kidney injury was previously known as acute kidney failure or acute renal failure. The kidneys are two organs that lie on either side of the spine between the middle of the back and the front of the abdomen. The kidneys: °· Remove wastes and extra water from the blood.   °· Produce important hormones. These help keep bones strong, regulate blood pressure, and help create red blood cells.   °· Balance the fluids and chemicals in the blood and tissues. °A small amount of kidney damage may not cause problems, but a large amount of damage may make it difficult or impossible for the kidneys to work the way they should. Acute kidney injury may develop into long-lasting (chronic) kidney disease. It may also develop into a life-threatening disease called end-stage kidney disease. Acute kidney injury can get worse very quickly, so it should be treated right away. Early treatment may prevent other kidney diseases from developing. °CAUSES  °· A problem with blood flow to the kidneys. This may be caused by:   °¨ Blood loss.   °¨ Heart disease.   °¨ Severe burns.   °¨ Liver disease. °· Direct damage to the kidneys. This may be caused by: °¨ Some medicines.   °¨ A kidney infection.   °¨ Poisoning or consuming toxic substances.   °¨ A surgical wound.   °¨ A blow to the kidney area.   °· A problem with urine flow. This may be caused by:   °¨ Cancer.   °¨ Kidney stones.   °¨ An enlarged prostate. °SIGNS AND SYMPTOMS  °· Swelling (edema) of the legs, ankles, or feet.   °· Tiredness (lethargy).   °· Nausea or vomiting.   °· Confusion.   °· Problems with urination, such as:   °¨ Painful or burning feeling during urination.   °¨ Decreased urine production.   °¨ Frequent accidents in children who are potty trained.   °¨ Bloody urine.   °· Muscle twitches and cramps.   °· Shortness of breath.   °· Seizures.   °· Chest  pain or pressure. °Sometimes, no symptoms are present.  °DIAGNOSIS °Acute kidney injury may be detected and diagnosed by tests, including blood, urine, imaging, or kidney biopsy tests.  °TREATMENT °Treatment of acute kidney injury varies depending on the cause and severity of the kidney damage. In mild cases, no treatment may be needed. The kidneys may heal on their own. If acute kidney injury is more severe, your health care provider will treat the cause of the kidney damage, help the kidneys heal, and prevent complications from occurring. Severe cases may require a procedure to remove toxic wastes from the body (dialysis) or surgery to repair kidney damage. Surgery may involve:  °· Repair of a torn kidney.   °· Removal of an obstruction. °HOME CARE INSTRUCTIONS °· Follow your prescribed diet. °· Take medicines only as directed by your health care provider.  °· Do not take any new medicines (prescription, over-the-counter, or nutritional supplements) unless approved by your health care provider. Many medicines can worsen your kidney damage or may need to have the dose adjusted.   °· Keep all follow-up visits as directed by your health care provider. This is important. °· Observe your condition to make sure you are healing as expected. °SEEK IMMEDIATE MEDICAL CARE IF: °· You are feeling ill or have severe pain in the back or side.   °· Your symptoms return or you have new symptoms. °· You have any symptoms of end-stage kidney disease. These include:   °¨ Persistent itchiness.   °¨   Loss of appetite.   °¨ Headaches.   °¨ Abnormally dark or light skin. °¨ Numbness in the hands or feet.   °¨ Easy bruising.   °¨ Frequent hiccups.   °¨ Menstruation stops.   °· You have a fever. °· You have increased urine production. °· You have pain or bleeding when urinating. °MAKE SURE YOU:  °· Understand these instructions. °· Will watch your condition. °· Will get help right away if you are not doing well or get worse. °  °This  information is not intended to replace advice given to you by your health care provider. Make sure you discuss any questions you have with your health care provider. °  °Document Released: 06/02/2011 Document Revised: 12/08/2014 Document Reviewed: 07/16/2012 °Elsevier Interactive Patient Education ©2016 [employer] ° °

## 2016-05-25 NOTE — Progress Notes (Signed)
Pt discharged to home. DC instructions given with wife at bedside. No concerns voiced. No prescriptions given. Pt left un it in wheelchair pushed by nurse tech accompanied by wife. Left in good condition. VWilliams,rn.

## 2016-05-25 NOTE — Discharge Summary (Signed)
[facility]  Physician Discharge Summary   Patient ID: Bradley Alvarez MRN: [id number] DOB/AGE: 1941/11/10 75 y.o.  Admit date: 05/22/2016 Discharge date: 05/25/2016  PCP: Precious Reel, MD  DISCHARGE DIAGNOSES:  Active Problems:   Diabetes mellitus type 2, uncontrolled, with complications ([place])   Morbid obesity with BMI of 45.0-49.9, adult (H[facility])   Sleep apnea, obstructive   Hypokalemia   Acute kidney injury ([place])   Hyponatremia   RECOMMENDATIONS FOR OUTPATIENT FOLLOW UP: 1. Close follow-up with PCP for blood work 2. Lasix and spironolactone have been placed on hold for now. Please address this at follow-up.  DISCHARGE CONDITION: fair  Diet recommendation: Modified carbohydrate  Filed Weights   05/23/16 0700  Weight: 138.347 kg (305 lb)    INITIAL HISTORY: 75 year old male with a past medical history of hypertension, diabetes, BPH presented with complaints of nausea, vomiting, malaise. Patient was found to have acute renal failure. Patient had undergone a colonoscopy on June 16 with removal of polyps. He underwent a CT scan subsequently due to abdominal pain which was unremarkable. And then presented with the above issues. He was hospitalized for further management.   HOSPITAL COURSE:   Acute kidney injury. Differential diagnoses include prerenal, versus contrast-induced, ATN or likely a combination of factors. Patient was given IV fluids. Renal ultrasound did not show any hydronephrosis. Patient's renal function started improving. He had good urine output. Almost back to his baseline now. Will need repeat work at follow-up.  Dyspnea Etiology for his symptoms not clear. However, he is feeling much better. Denies any shortness of breath. He did have elevated d-dimer, however, VQ scan was low probability. Chest x-ray did not show any acute abnormalities.  Diabetes mellitus type 2 Continue home medication regimen.  History of obstructive sleep apnea. Continue CPAP  at night.  Hyponatremia and hypokalemia Sodium levels have improved with IV hydration. Replaced potassium. Magnesium was also low and this was also repleted.  History of essential hypertension. His antihypertensive agents were held due to initial low blood pressure and renal failure. He may resume all of his home medications. Blood pressure noted to be elevated. His diuretics have been held due to renal failure.  Overall much improved. Patient feels much better. Wishes to go home. Medically stable for discharge.   PERTINENT LABS:  The results of significant diagnostics from this hospitalization (including imaging, microbiology, ancillary and laboratory) are listed below for reference.     Labs: Basic Metabolic Panel:  Recent Labs Lab 05/22/16 1129 05/22/16 1548 05/23/16 0334 05/24/16 0517 05/25/16 0521  NA 125*  --  132* 136 136  K 2.9*  --  3.0* 3.1* 3.4*  CL 87*  --  94* 100* 100*  CO2 24  --  26 27 27   GLUCOSE 212*  --  142* 165* 173*  BUN 42*  --  37* 25* 13  CREATININE 4.65*  --  3.32* 1.90* 1.38*  CALCIUM 8.1*  --  8.2* 8.8* 8.8*  MG  --  1.4* 1.5*  --   --   PHOS  --   --  3.4 2.9  --    Liver Function Tests:  Recent Labs Lab 05/24/16 0517  ALBUMIN 3.4*   CBC:  Recent Labs Lab 05/22/16 1129 05/23/16 0334 05/25/16 0521  WBC 10.2 8.3 10.1  HGB 11.1* 10.5* 10.7*  HCT 32.1* 30.9* 31.1*  MCV 76.4* 76.5* 76.6*  PLT 176 195 250   Cardiac Enzymes:  Recent Labs Lab 05/22/16 1129 05/22/16 1548 05/22/16 2117  05/23/16 0334  TROPONINI <0.03 <0.03 <0.03 0.03   CBG:  Recent Labs Lab 05/24/16 0722 05/24/16 1137 05/24/16 1648 05/24/16 2113 05/25/16 0721  GLUCAP 173* 228* 209* 188* 177*     IMAGING STUDIES Dg Chest 2 View  05/22/2016  CLINICAL DATA:  Shortness of breath and weakness. EXAM: CHEST  2 VIEW COMPARISON:  09/12/2014 FINDINGS: Mild cardiac enlargement. Asymmetric elevation of the right hemidiaphragm is identified. No pleural effusion or  edema identified. No airspace consolidation. IMPRESSION: 1. Asymmetric elevation of the right hemidiaphragm. 2. No acute cardiopulmonary abnormalities. Electronically Signed   By: Kerby Moors M.D.   On: 05/22/2016 12:59   Ct Abdomen Pelvis W Contrast  05/18/2016  CLINICAL DATA:  Abdominal pain and distension. Colonoscopy yesterday. EXAM: CT ABDOMEN AND PELVIS WITH CONTRAST TECHNIQUE: Multidetector CT imaging of the abdomen and pelvis was performed using the standard protocol following bolus administration of intravenous contrast. CONTRAST:  130mL ISOVUE-300 IOPAMIDOL (ISOVUE-300) INJECTION 61% COMPARISON:  CT 08/29/2009.  Pelvic MRI 04/28/2009 FINDINGS: Lower chest: Chronic elevation of right hemidiaphragm with adjacent atelectasis. No pleural effusion. Scattered coronary artery calcifications. Liver: No focal hepatic lesion. Hepatobiliary: Gallbladder physiologically distended, no calcified stone. No biliary dilatation. Pancreas: No ductal dilatation or inflammation. Spleen: Normal. Adrenal glands: Minimal thickening of the right adrenal gland without discrete nodule. The left adrenal gland is normal. Kidneys: Symmetric renal enhancement. No hydronephrosis. Mild symmetric perinephric soft tissue stranding. There is a simple cyst in the interpolar right kidney. Exophytic cyst from the posterior left kidney. Stomach/Bowel: Stomach is decompressed. There are no dilated or thickened small bowel loops. No abnormal colonic distention. Small volume of stool throughout the colon without colonic wall thickening. No pericolonic inflammation. No evidence appendicitis. Vascular/Lymphatic: No retroperitoneal adenopathy. Prominent right inguinal node is unchanged from prior MRI. Abdominal aorta is normal in caliber. Mild atherosclerosis without aneurysm. Reproductive: Prostate gland not well visualized. Bladder: Physiologically distended without wall thickening. Other: No free air, free fluid, or intra-abdominal fluid  collection. Fat within the left inguinal canal. Musculoskeletal: There are no acute or suspicious osseous abnormalities. Fatty infiltration of right tensor fascia lata muscle. 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 IMPRESSION: No acute abnormality in the abdomen/pelvis. No evidence of complication post colonoscopy. Electronically Signed   By: Jeb Levering M.D.   On: 05/18/2016 00:13   US Renal  05/22/2016  CLINICAL DATA:  Acute kidney injury EXAM: RENAL / URINARY TRACT ULTRASOUND COMPLETE COMPARISON:  None. FINDINGS: Right Kidney: Length: 12.2 cm. Echogenicity within normal limits. No mass or hydronephrosis visualized. Left Kidney: Length: 11.7 cm. Echogenicity within normal limits. No mass or hydronephrosis visualized. Bladder: Appears normal for degree of bladder distention. IMPRESSION: Normal renal ultrasound. Electronically Signed   By: Kathreen Devoid   On: 05/22/2016 20:37   Nm Pulmonary Perf And Vent  05/22/2016  CLINICAL DATA:  Shortness of breath and elevated D-dimer. Acute renal failure with contraindication to contrast administration. EXAM: NUCLEAR MEDICINE VENTILATION - PERFUSION LUNG SCAN TECHNIQUE: Ventilation images were obtained in multiple projections using inhaled aerosol Tc-73m DTPA. Perfusion images were obtained in multiple projections after intravenous injection of Tc-84m MAA. RADIOPHARMACEUTICALS:  30.9 mCi Technetium-30m DTPA aerosol inhalation and 4.4 mCi Technetium-67m MAA IV COMPARISON:  Chest x-ray earlier today. FINDINGS: Ventilation: No significant ventilatory defects. There is some retention of aerosol in the central airways. This may reflect poor ventilation. There is elevation of the right hemidiaphragm causing reduced right lung volume. Perfusion: No significant subsegmental or segmental perfusion defects are identified. IMPRESSION:  Low probability for acute pulmonary embolism. Although the ventilatory study is relatively poor, the perfusion study shows no  significant perfusion defects. Electronically Signed   By: Aletta Edouard M.D.   On: 05/22/2016 17:09    DISCHARGE EXAMINATION: Filed Vitals:   05/24/16 1558 05/24/16 2114 05/25/16 0541 05/25/16 0606  BP: 155/96 144/74 186/80 168/78  Pulse: 61 59 64   Temp:  97.4 F (36.3 C) 98.6 F (37 C)   TempSrc:  Oral Oral   Resp:  18 18   Height:      Weight:      SpO2:  99% 100%    General appearance: alert, cooperative, appears stated age and no distress Resp: clear to auscultation bilaterally Cardio: regular rate and rhythm, S1, S2 normal, no murmur, click, rub or gallop GI: soft, non-tender; bowel sounds normal; no masses,  no organomegaly Extremities: extremities normal, atraumatic, no cyanosis or edema  DISPOSITION: Home with wife  Discharge Instructions    Call MD for:  difficulty breathing, headache or visual disturbances    Complete by:  As directed      Call MD for:  extreme fatigue    Complete by:  As directed      Call MD for:  hives    Complete by:  As directed      Call MD for:  persistant dizziness or light-headedness    Complete by:  As directed      Call MD for:  persistant nausea and vomiting    Complete by:  As directed      Call MD for:  severe uncontrolled pain    Complete by:  As directed      Call MD for:  temperature >100.4    Complete by:  As directed      Discharge instructions    Complete by:  As directed   Please see your PCP before end of week for blood work and follow up. He will need to tell you when you can resume the Lasix and the Spironolactone.  You were cared for by a hospitalist during your hospital stay. If you have any questions about your discharge medications or the care you received while you were in the hospital after you are discharged, you can call the unit and asked to speak with the hospitalist on call if the hospitalist that took care of you is not available. Once you are discharged, your primary care physician will handle any further  medical issues. Please note that NO REFILLS for any discharge medications will be authorized once you are discharged, as it is imperative that you return to your primary care physician (or establish a relationship with a primary care physician if you do not have one) for your aftercare needs so that they can reassess your need for medications and monitor your lab values. If you do not have a primary care physician, you can call [phone] for a physician referral.     Increase activity slowly    Complete by:  As directed            ALLERGIES: No Known Allergies   Discharge Medication List as of 05/25/2016  8:17 AM    CONTINUE these medications which have NOT CHANGED   Details  aspirin EC 81 MG tablet Take 81 mg by mouth daily., Until Discontinued, Historical Med    B-D UF III MINI PEN NEEDLES 31G X 5 MM MISC Starting 02/08/2015, Until Discontinued, Historical Med    BYETTA 10 MCG  PEN 10 MCG/0.04ML SOPN injection Inject 10 mcg into the skin daily. , Starting 10/07/2013, Until Discontinued, Historical Med    carvedilol (COREG) 25 MG tablet Take 25 mg by mouth 2 (two) times daily with a meal., Until Discontinued, Historical Med    clobetasol ointment (TEMOVATE) [id number] % Apply 1 application topically daily as needed (for itching). , Starting 09/23/2013, Until Discontinued, Historical Med    cloNIDine (CATAPRES) 0.1 MG tablet Take 0.1 mg by mouth 2 (two) times daily. , Starting 04/09/2015, Until Discontinued, Historical Med    Cyanocobalamin 2500 MCG TABS Take 2,500 mcg by mouth daily., Until Discontinued, Historical Med    diclofenac sodium (VOLTAREN) 1 % GEL Apply 1 application topically 4 (four) times daily as needed (For pain.)., Until Discontinued, Historical Med    docusate sodium 100 MG CAPS 1 tab 2 times a day while on narcotics.  STOOL SOFTENER, Print    felodipine (PLENDIL) 5 MG 24 hr tablet Take 10 mg by mouth every morning. , Until Discontinued, Historical Med    FIBER PO Take 1  capsule by mouth daily., Until Discontinued, Historical Med    HYDROcodone-acetaminophen (NORCO/VICODIN) 5-325 MG per tablet Take 1 tablet by mouth every 12 (twelve) hours as needed for moderate pain. , Starting 12/06/2013, Until Discontinued, Historical Med    loratadine (CLARITIN) 10 MG tablet Take 20 mg by mouth every morning., Until Discontinued, Historical Med    losartan (COZAAR) 100 MG tablet Take 100 tablets by mouth daily. , Starting 03/29/2015, Until Discontinued, Historical Med    MEGARED OMEGA-3 KRILL OIL PO Take 1 tablet by mouth daily., Until Discontinued, Historical Med    metFORMIN (GLUCOPHAGE) 1000 MG tablet Take 1,000 mg by mouth 2 (two) times daily with a meal., Until Discontinued, Historical Med    Multiple Vitamin (MULTIVITAMIN WITH MINERALS) TABS tablet Take 1 tablet by mouth daily., Until Discontinued, Historical Med    ONETOUCH VERIO test strip USE TO TEST BID AS DIRECTED, Historical Med    simvastatin (ZOCOR) 40 MG tablet Take 40 mg by mouth every evening., Until Discontinued, Historical Med    vitamin C (ASCORBIC ACID) 500 MG tablet Take 500 mg by mouth daily. , Until Discontinued, Historical Med      STOP taking these medications     furosemide (LASIX) 80 MG tablet      spironolactone (ALDACTONE) 25 MG tablet        Follow-up Information    Follow up with Precious Reel, MD. Schedule an appointment as soon as possible for a visit in 3 days.   Specialty:  Internal Medicine   Why:  for blood work to talk about resuming lasix and spironolactone   Contact information:   [place] [id number] [phone]       TOTAL DISCHARGE TIME: 35 minutes  [facility] Pager [phone]  05/25/2016, 11:47 AM

## 2016-05-30 ENCOUNTER — Telehealth: Payer: Self-pay | Admitting: *Deleted

## 2016-05-30 NOTE — Telephone Encounter (Signed)
Called patient to schedule an appointment for his diabetic shoe measurement.  Patient states that he just received his shoes from another company.  I told that we would not be able to do shoes for him as it would be a duplication of service and end up costing him the full amount out of pocket.  He asked when he can do again and I told him we can do again in January 2018

## 2016-06-19 ENCOUNTER — Encounter: Payer: Self-pay | Admitting: Podiatry

## 2016-06-19 ENCOUNTER — Ambulatory Visit (INDEPENDENT_AMBULATORY_CARE_PROVIDER_SITE_OTHER): Payer: Medicare Other | Admitting: Podiatry

## 2016-06-19 DIAGNOSIS — M79673 Pain in unspecified foot: Secondary | ICD-10-CM | POA: Diagnosis not present

## 2016-06-19 DIAGNOSIS — B351 Tinea unguium: Secondary | ICD-10-CM | POA: Diagnosis not present

## 2016-06-19 DIAGNOSIS — E119 Type 2 diabetes mellitus without complications: Secondary | ICD-10-CM

## 2016-06-19 NOTE — Progress Notes (Signed)
Patient ID: Bradley Alvarez, male   DOB: [birth year], 75 y.o.   MRN: [id number] Complaint:  Visit Type: Patient returns to my office for continued preventative foot care services. Complaint: Patient states" my nails have grown long and thick and become painful to walk and wear shoes" Patient has been diagnosed with DM with no complications. He presents for preventative foot care services. Eye surgery eyes B/L.  Podiatric Exam: Vascular: dorsalis pedis and posterior tibial pulses are palpable bilateral. Capillary return is immediate. Temperature gradient is WNL. Skin turgor WNL  Sensorium: Normal Semmes Weinstein monofilament test. Normal tactile sensation bilaterally. Nail Exam: Pt has thick disfigured discolored nails with subungual debris noted bilateral entire nail hallux through fifth toenails Ulcer Exam: There is no evidence of ulcer or pre-ulcerative changes or infection. Orthopedic Exam: Muscle tone and strength are WNL. No limitations in general ROM. No crepitus or effusions noted. PTT tendinitis/arthritis left foot.  HAV  B/L.  Pes planus B/L Skin: No Porokeratosis. No infection or ulcers  Diagnosis:  Tinea unguium, Pain in right toe, pain in left toes  Treatment & Plan Procedures and Treatment: Consent by patient was obtained for treatment procedures. The patient understood the discussion of treatment and procedures well. All questions were answered thoroughly reviewed. Debridement of mycotic and hypertrophic toenails, 1 through 5 bilateral and clearing of subungual debris. No ulceration, no infection noted. 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 Return Visit-Office Procedure: Patient instructed to return to the office for a follow up visit 3 months for continued evaluation and treatment.   Gardiner Barefoot DPM

## 2016-08-12 ENCOUNTER — Encounter: Payer: Self-pay | Admitting: Cardiovascular Disease

## 2016-08-12 ENCOUNTER — Ambulatory Visit (INDEPENDENT_AMBULATORY_CARE_PROVIDER_SITE_OTHER): Payer: Medicare Other | Admitting: Cardiovascular Disease

## 2016-08-12 VITALS — BP 138/90 | HR 73 | Ht 74.0 in | Wt 308.0 lb

## 2016-08-12 DIAGNOSIS — E785 Hyperlipidemia, unspecified: Secondary | ICD-10-CM | POA: Diagnosis not present

## 2016-08-12 DIAGNOSIS — R600 Localized edema: Secondary | ICD-10-CM

## 2016-08-12 DIAGNOSIS — I10 Essential (primary) hypertension: Secondary | ICD-10-CM | POA: Diagnosis not present

## 2016-08-12 DIAGNOSIS — G4733 Obstructive sleep apnea (adult) (pediatric): Secondary | ICD-10-CM

## 2016-08-12 DIAGNOSIS — I2510 Atherosclerotic heart disease of native coronary artery without angina pectoris: Secondary | ICD-10-CM | POA: Diagnosis not present

## 2016-08-12 DIAGNOSIS — Z6841 Body Mass Index (BMI) 40.0 and over, adult: Secondary | ICD-10-CM

## 2016-08-12 NOTE — Patient Instructions (Signed)
Medication Instructions:   TAKE FUROSEMIDE 20 MG TODAY  Follow-Up:  Your physician wants you to follow-up in: [place] will receive a reminder letter in the mail two months in advance. If you don't receive a letter, please call our office to schedule the follow-up appointment.   If you need a refill on your cardiac medications before your next appointment, please call your pharmacy.

## 2016-08-14 NOTE — Progress Notes (Signed)
Patient ID: MEKEL HAVERSTOCK, male   DOB: [birth year], 75 y.o.   MRN: [id number]    PCP: Dr. [name]  HPI: LOYCE KLASEN is a 75 y.o. African-American male who presents to the office for a 2 month follow-up cardiology  evaluation.  Mr. Hyland has a history of hypertension, type 2 diabetes mellitus, mixed hyperlipidemia, severe sleep apnea with CPAP therapy, morbid obesity, and documented grade 1 diastolic dysfunction.  He underwent total knee replacement surgery on 12/20/2012 without cardiovascular complications. A nuclear perfusion study in August 2015  showed small apical area of attenuation without ischemia.  Ejection fraction was 53%.  He had 17 teeth pulled in September 2015 and underwent left ankle operation in October 2015.  He has had issues with dyspnea and leg swelling. He has been on carvedilol 25 mg twice a day, Plendil 10 mg daily, clonidine 0.1 mg twice a day, furosemide 20 mg and losartan 100 daily for blood pressure control.  I last saw him, he was having some difficulty with lower extremity edema.  I also reviewed lab work from [employer] which revealed mildly low potassium.  For improved blood pressure control, edema, and his low potassium  I recommended initiation of spironolactone initially at 25 mg daily.  He has been on metformin 1000 g twice a day in addition to Byetta for his diabetes mellitus.  Has been taking simvastatin 40 mg for hyperlipidemia..  With reference to his obstructive sleep apnea, he has a new Resmed AirSense 10 AutoSet CPAP unit and is set at a 5 cm and a maximum pressure of 20 cm.  His mask is a [employer] full.  Face mask large.  A download from 03/05/2014 through 04/03/2014:  AHI is excellent at 1.4, with an apnea index of only 0.6.  His 95th percentile pressure was 9.9.  He did not have any central apnea events.  He is 100% compliant, averaging 9 hours and 11 minutes of usage daily.  He denies significant awareness of daytime sleepiness.  He is  unaware of breakthrough snoring.  His most recent Lipid studies from [place] revealed a total cholesterol 131, triglycerides 97, HDL 42, LDL 77.  Hemoglobin A1c was 6.1.  TSH 1.66.  He had evidence for urinary microalbuminurea.  When I last saw Mr. Heindel had noted increasing shortness of breath and lower extremity edema for at least a month.  He had been on furosemide 40 mg daily.  His Lasix was increased to 60 mg daily with improvement over the past several months, Mr. Parham has made a dramatic change in his diet.  He also has stopped eating any foods after 8 PM.  When I last saw him, he had lost 20 pounds and since that evaluation, he has lost an additional 20 pounds for a 40 pound weight loss since November 2016. He continues to use CPAP with 100% compliance.  He went to the hospital in June with dehydration and acute kidney injury.  This occurred after a colonoscopy was contributed by his continuing to take Lasix during his bowel prep. His creatinine was 4.65 and serum sodium 126 with a potassium of 2.9.  His renal function ultimately normalized.  While hospitalized, a 2-D echo Doppler study showed an EF of 60-65% with grade 1 diastolic dysfunction.  He had moderate left atrial dilatation.  His right ventricle was mildly dilated.  There was mild AR.  His  ascending aorta measured 45 mm, but was unchanged from 2014.  He had recently seen Dr. Virgina Jock and was told that he can resume Lasix on an as-needed basis if swelling develops.  Recent blood work by Dr. Hebert Soho had shown a creatinine of 1.1 and a hemoglobin A1c of 5.1.  He still notes some occasional right ankle swelling.  He presents for reevaluation.  Past Medical History:  Diagnosis Date  . Aortic valve sclerosis   . Benign prostate hyperplasia   . Depression    medication induced  . Diabetes mellitus type 2, uncontrolled, with complications ([place])   . Early cataracts, bilateral   . GERD (gastroesophageal reflux disease)   . Heart  murmur    as a young adult  . Heart valve disorder    grade I dyastolic dysfunction  . Hernia of abdominal wall   . Hypercholesteremia   . Hypertension 10/02/11   lexiscan myoview negative for ischemia EF 46%low risk scan.  ECHO 12/07/12 No change in previous echo.  . Left knee DJD   . Lichen planus   . Morbid obesity with BMI of 45.0-49.9, adult ([place])   . 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 Shortness of breath    WITH EXERTION   . Sleep apnea, obstructive 10/14/08   PSA- [facility] AHI duuring total sleep was 61.8/hr and during REMsleep 0.0/hr RDI during total sleep was 96.1/hr. and during REM sleep at 0.0/hr. The average O2 sat range during REM and NREM was 94% The lowest O2 Sat during REM and NREM was 85%  Severe OSA /hypopnea syndrome. Notably events were worse in the supine position and during REM sleep.     Past Surgical History:  Procedure Laterality Date  . CARDIAC CATHETERIZATION    . COLONOSCOPY W/ BIOPSIES AND POLYPECTOMY    . COLONOSCOPY WITH PROPOFOL N/A 05/16/2016   Procedure: COLONOSCOPY WITH PROPOFOL;  Surgeon: Carol Ada, MD;  Location: [facility];  Service: Endoscopy;  Laterality: N/A;  . KNEE ARTHROSCOPY     RT KNEE   . MULTIPLE TOOTH EXTRACTIONS    . ORIF ANKLE FRACTURE Left 09/13/2014   Procedure: Open Reduction Internal Fixation Left Ankle Medial Malleolus;  Surgeon: Newt Minion, MD;  Location: [facility];  Service: Orthopedics;  Laterality: Left;  . SUPRAPUBIC PROSTATECTOMY    . TOTAL KNEE ARTHROPLASTY  08/27/2009   right knee  . TOTAL KNEE ARTHROPLASTY  12/20/2012   Procedure: TOTAL KNEE ARTHROPLASTY;  Surgeon: Lorn Junes, MD;  Location: [facility];  Service: Orthopedics;  Laterality: Left;  left total knee arthroplasty    No Known Allergies  Current Outpatient Prescriptions  Medication Sig Dispense Refill  . aspirin EC 81 MG tablet Take 81 mg by mouth daily.    . B-D UF III MINI PEN NEEDLES 31G X 5 MM MISC   3  . BYETTA 10  MCG PEN 10 MCG/0.04ML SOPN injection Inject 10 mcg into the skin daily.     . carvedilol (COREG) 25 MG tablet Take 25 mg by mouth 2 (two) times daily with a meal.    . clobetasol ointment (TEMOVATE) 9.52 % Apply 1 application topically daily as needed (for itching).     . cloNIDine (CATAPRES) 0.1 MG tablet Take 0.1 mg by mouth 2 (two) times daily.     . Cyanocobalamin 2500 MCG TABS Take 2,500 mcg by mouth daily.    . diclofenac sodium (VOLTAREN) 1 % GEL Apply 1 application topically 4 (four) times daily as needed (For pain.).    Marland Kitchen  docusate sodium 100 MG CAPS 1 tab 2 times a day while on narcotics.  STOOL SOFTENER (Patient taking differently: Take 100-300 mg by mouth 2 (two) times daily as needed (for constipation). ) 60 capsule 0  . felodipine (PLENDIL) 5 MG 24 hr tablet Take 10 mg by mouth every morning.     Marland Kitchen FIBER PO Take 1 capsule by mouth daily.    . furosemide (LASIX) 40 MG tablet Take 40 mg by mouth daily as needed.    Marland Kitchen HYDROcodone-acetaminophen (NORCO/VICODIN) 5-325 MG per tablet Take 1 tablet by mouth every 12 (twelve) hours as needed for moderate pain.     Marland Kitchen loratadine (CLARITIN) 10 MG tablet Take 20 mg by mouth every morning.    Marland Kitchen losartan (COZAAR) 100 MG tablet Take 100 tablets by mouth daily.     Marland Kitchen MEGARED OMEGA-3 KRILL OIL PO Take 1 tablet by mouth daily.    . metFORMIN (GLUCOPHAGE) 1000 MG tablet Take 1,000 mg by mouth 2 (two) times daily with a meal.    . Multiple Vitamin (MULTIVITAMIN WITH MINERALS) TABS tablet Take 1 tablet by mouth daily.    Glory Rosebush VERIO test strip USE TO TEST BID AS DIRECTED  2  . potassium chloride (KLOR-CON) 20 MEQ packet Take 20 mEq by mouth daily as needed.    . simvastatin (ZOCOR) 40 MG tablet Take 40 mg by mouth every evening.    . vitamin C (ASCORBIC ACID) 500 MG tablet Take 500 mg by mouth daily.      No current facility-administered medications for this visit.     Socially he is married and has 5 children. He is not able to exercise  routinely. He has remote tobacco history. There is no alcohol. He states he did lose more weight but has regained some weight back.  ROS General: Negative; No fevers, chills, or night sweats;  HEENT: Negative; No changes in vision or hearing, sinus congestion, difficulty swallowing Pulmonary: Negative; No cough, wheezing, shortness of breath, hemoptysis Cardiovascular: Negative; No chest pain, presyncope, syncope, palpitations Positive for edema GI: Negative; No nausea, vomiting, diarrhea, or abdominal pain GU: Negative; No dysuria, hematuria, or difficulty voiding Musculoskeletal: Negative; no myalgias, joint pain, or weakness Hematologic/Oncology: Negative; no easy bruising, bleeding Endocrine: Negative; no heat/cold intolerance; no diabetes Neuro: Negative; no changes in balance, headaches Skin: Negative; No rashes or skin lesions Psychiatric: Negative; No behavioral problems, depression Sleep: Positive for obstructive sleep apnea.  He cannot sleep without it.  He is unaware of breakthrough snoring No daytime sleepiness, hypersomnolence, bruxism, restless legs, hypnogognic hallucinations, no cataplexy Other comprehensive 14 point system review is negative.   PE BP 138/90   Pulse 73   Ht _0  (1.88 m)   Wt (!) 308 lb (139.7 kg)   BMI 39.54 kg/m   Repeat blood pressure 160/888.  He states his blood pressure at home typically runs in the 710G systolically.  Wt Readings from Last 3 Encounters:  08/12/16 (!) 308 lb (139.7 kg)  05/23/16 (!) 305 lb (138.3 kg)  05/17/16 (!) 306 lb (138.8 kg)   General: Alert, oriented, no distress. Morbidly obese. Skin: normal turgor, no rashes HEENT: Normocephalic, atraumatic. Pupils round and reactive; sclera anicteric;no lid lag.  Nose without nasal septal hypertrophy Mouth/Parynx benign; Mallinpatti scale 4 Neck: Very thick neck; No JVD, no carotid bruits; normal carotid upstroke Lungs: clear to ausculatation and percussion; no wheezing or  rales Chest wall: Nontender to palpation Heart: RRR, s1 s2 normal 2-6/9 systolic murmur.  No S3 or S4 gallop heard.  No diastolic murmur, rubs, thrills or heaves Abdomen: Moderate central adiposity soft, nontender; no hepatosplenomehaly, BS+; abdominal aorta nontender and not dilated by palpation. Back: No CVA tenderness Pulses 2+ Extremities: Resolution of previous tense edema in his lower extremities bilaterally.Trivial right ankle edema.; no clubbing cyanosis, Homan's sign negative  Neurologic: grossly nonfocal Psychological: Normal affect and mood  ECG (independently read by me): Normal sinus rhythm at 73 bpm.  First-degree AV block with a PR interval 210 ms.  Premature atrial contraction.  January 2017 ECG (independently read by me): Normal sinus rhythm at 64 bpm.  No ectopy.  November 2016 ECG (independently read by me): Normal sinus rhythm at 71 bpm.  Poor anterior R-wave progression.  Normal PR interval at 190 ms.  QTc interval 471 ms.  Prior ECG (independently read by me): Sinus bradycardia with first-degree AV block with PR interval of 216 ms.  No significant ST-T changes.  May 2016 ECG (independently read by me): Normal sinus rhythm with first-degree AV block with a PR interval at 232 ms.  No significant ST changes.  November 2015 ECG (independently read by me): Normal sinus rhythm at 62 bpm.  PR interval 190 ms QTc interval 454 ms.  Prior ECG (independently read by me): Normal sinus rhythm at 75 beats per minute. Borderline first degree AV block with a PR interval of 240 ms. QTC interval normal at 444 ms. No significant ST changes.   Prior ECG from 06/13/2013 : Sinus rhythm at 55 beats per minute. PR interval 202 ms. QTC interval 443 ms.  LABS:  BMP Latest Ref Rng & Units 05/25/2016 05/24/2016 05/23/2016  Glucose 65 - 99 mg/dL 173(H) 165(H) 142(H)  BUN 6 - 20 mg/dL 13 25(H) 37(H)  Creatinine 0.61 - 1.24 mg/dL 1.38(H) 1.90(H) 3.32(H)  Sodium 135 - 145 mmol/L 136 136 132(L)    Potassium 3.5 - 5.1 mmol/L 3.4(L) 3.1(L) 3.0(L)  Chloride 101 - 111 mmol/L 100(L) 100(L) 94(L)  CO2 22 - 32 mmol/L _0 Calcium 8.9 - 10.3 mg/dL 8.8(L) 8.8(L) 8.2(L)   Hepatic Function Latest Ref Rng & Units 05/24/2016 05/17/2016 10/29/2015  Total Protein 6.5 - 8.1 g/dL - 8.0 7.2  Albumin 3.5 - 5.0 g/dL 3.4(L) 4.5 4.5  AST 15 - 41 U/L - 27 23  ALT 17 - 63 U/L - 22 21  Alk Phosphatase 38 - 126 U/L - 40 42  Total Bilirubin 0.3 - 1.2 mg/dL - 0.9 0.7   CBC Latest Ref Rng & Units 05/25/2016 05/23/2016 05/22/2016  WBC 4.0 - 10.5 K/uL 10.1 8.3 10.2  Hemoglobin 13.0 - 17.0 g/dL 10.7(L) 10.5(L) 11.1(L)  Hematocrit 39.0 - 52.0 % 31.1(L) 30.9(L) 32.1(L)  Platelets 150 - 400 K/uL 250 195 176   Lab Results  Component Value Date   MCV 76.6 (L) 05/25/2016   MCV 76.5 (L) 05/23/2016   MCV 76.4 (L) 05/22/2016   Lab Results  Component Value Date   TSH 3.006 10/29/2015    Lipid Panel     Component Value Date/Time   CHOL 144 10/29/2015 0836   TRIG 136 10/29/2015 0836   HDL 55 10/29/2015 0836   CHOLHDL 2.6 10/29/2015 0836   VLDL 27 10/29/2015 0836   LDLCALC 62 10/29/2015 0836     RADIOLOGY: No results found.    ASSESSMENT AND PLAN: Mr. Lechuga is a 75 year old African-American gentleman who has a history of significant morbid obesity and has cardiovascular comorbidities including hypertension with grade 1  diastolic dysfunction, type 2 diabetes mellitus, mixed hyperlipidemia, and severe sleep apnea with CPAP therapy. The past he has had issues with stage II hypertension.  Over the past year, he has significantly changed his diet and no longer eats after 8 PM.  He is lost over 40 pounds.  He had undergone a colonoscopy this summer following his extensive bowel prep while continuing to take his diuretic regimen.  He developed acute kidney injury leading to discontinuance of his diuretic.  Repeat echo Doppler study.  While he was hospitalized in June 2017 showed an EF of 60-65% with grade 1  diastolic dysfunction.  He is recently started to notice some recurrence of ankle edema and has available Lasix to take on an as-needed basis.  His blood pressure today is controlled on his current regimen consisting of carvedilol 25 mg twice a day, Catapres 0.1 mg twice a day, felodipine 10 mg daily.  Losartan 100 mg daily and he has a prescription for furosemide 40 mg.  Since he has edema today.  I have suggested that he take a 20 mg dose of furosemide rather than the 40.  He continues to tolerate simvastatin for hyperlipidemia.  He is diabetic on metformin in addition to his Byetta.  He continues to use CPAP with 100% compliance and denies any residual daytime sleepiness or awareness of breakthrough snoring.  I commended him on his weight loss.  Still hopes to lose another 20 pounds.  I will see him in 6 months for reevaluation.  Time spent: 25 minutes  Troy Sine, MD, [facility]  08/14/2016 4:44 PM

## 2016-09-01 ENCOUNTER — Other Ambulatory Visit: Payer: Self-pay | Admitting: Cardiovascular Disease

## 2016-09-01 NOTE — Telephone Encounter (Signed)
REFILL 

## 2016-09-11 ENCOUNTER — Ambulatory Visit (INDEPENDENT_AMBULATORY_CARE_PROVIDER_SITE_OTHER): Payer: Medicare Other | Admitting: Podiatry

## 2016-09-11 ENCOUNTER — Encounter: Payer: Self-pay | Admitting: Podiatry

## 2016-09-11 DIAGNOSIS — M79676 Pain in unspecified toe(s): Secondary | ICD-10-CM

## 2016-09-11 DIAGNOSIS — E119 Type 2 diabetes mellitus without complications: Secondary | ICD-10-CM | POA: Diagnosis not present

## 2016-09-11 DIAGNOSIS — B351 Tinea unguium: Secondary | ICD-10-CM

## 2016-09-11 NOTE — Progress Notes (Signed)
Patient ID: HARTFORD SKYBERG, male   DOB: 01-12-1941, 75 y.o.   MRN: [id number] Complaint:  Visit Type: Patient returns to my office for continued preventative foot care services. Complaint: Patient states" my nails have grown long and thick and become painful to walk and wear shoes" Patient has been diagnosed with DM with no complications. He presents for preventative foot care services. Eye surgery eyes B/L.  Podiatric Exam: Vascular: dorsalis pedis and posterior tibial pulses are palpable bilateral. Capillary return is immediate. Temperature gradient is WNL. Skin turgor WNL  Sensorium: Normal Semmes Weinstein monofilament test. Normal tactile sensation bilaterally. Nail Exam: Pt has thick disfigured discolored nails with subungual debris noted bilateral entire nail hallux through fifth toenails Ulcer Exam: There is no evidence of ulcer or pre-ulcerative changes or infection. Orthopedic Exam: Muscle tone and strength are WNL. No limitations in general ROM. No crepitus or effusions noted. PTT tendinitis/arthritis left foot.  HAV  B/L.  Pes planus B/L Skin: No Porokeratosis. No infection or ulcers  Diagnosis:  Tinea unguium, Pain in right toe, pain in left toes  Treatment & Plan Procedures and Treatment: Consent by patient was obtained for treatment procedures. The patient understood the discussion of treatment and procedures well. All questions were answered thoroughly reviewed. Debridement of mycotic and hypertrophic toenails, 1 through 5 bilateral and clearing of subungual debris. No ulceration, no infection noted. . Return Visit-Office Procedure: Patient instructed to return to the office for a follow up visit 3 months for continued evaluation and treatment.   Gardiner Barefoot DPM

## 2016-12-01 HISTORY — PX: CATARACT EXTRACTION W/ INTRAOCULAR LENS  IMPLANT, BILATERAL: SHX1307

## 2016-12-04 ENCOUNTER — Ambulatory Visit (INDEPENDENT_AMBULATORY_CARE_PROVIDER_SITE_OTHER): Payer: Medicare HMO | Admitting: Podiatry

## 2016-12-04 ENCOUNTER — Encounter: Payer: Self-pay | Admitting: Podiatry

## 2016-12-04 VITALS — Ht 74.0 in | Wt 308.0 lb

## 2016-12-04 DIAGNOSIS — B351 Tinea unguium: Secondary | ICD-10-CM

## 2016-12-04 DIAGNOSIS — M2140 Flat foot [pes planus] (acquired), unspecified foot: Secondary | ICD-10-CM

## 2016-12-04 DIAGNOSIS — E119 Type 2 diabetes mellitus without complications: Secondary | ICD-10-CM

## 2016-12-04 DIAGNOSIS — M2010 Hallux valgus (acquired), unspecified foot: Secondary | ICD-10-CM

## 2016-12-04 NOTE — Progress Notes (Signed)
Patient ID: Bradley Alvarez, male   DOB: 1941/10/20, 76 y.o.   MRN: [id number] Complaint:  Visit Type: Patient returns to my office for continued preventative foot care services. Complaint: Patient states" my nails have grown long and thick and become painful to walk and wear shoes" Patient has been diagnosed with DM with no complications. He presents for preventative foot care services. Eye surgery eyes B/L.  Podiatric Exam: Vascular: dorsalis pedis and posterior tibial pulses are palpable bilateral. Capillary return is immediate. Temperature gradient is WNL. Skin turgor WNL  Sensorium: Normal Semmes Weinstein monofilament test. Normal tactile sensation bilaterally. Nail Exam: Pt has thick disfigured discolored nails with subungual debris noted bilateral entire nail hallux through fifth toenails Ulcer Exam: There is no evidence of ulcer or pre-ulcerative changes or infection. Orthopedic Exam: Muscle tone and strength are WNL. No limitations in general ROM. No crepitus or effusions noted. PTT tendinitis/arthritis left foot.  HAV  B/L.  Pes planus B/L Skin: No Porokeratosis. No infection or ulcers  Diagnosis:  Tinea unguium, Pain in right toe, pain in left toes  Treatment & Plan Procedures and Treatment: Consent by patient was obtained for treatment procedures. The patient understood the discussion of treatment and procedures well. All questions were answered thoroughly reviewed. Debridement of mycotic and hypertrophic toenails, 1 through 5 bilateral and clearing of subungual debris. No ulceration, no infection noted. .Initiate diabetic shoe paperwork. Return Visit-Office Procedure: Patient instructed to return to the office for a follow up visit 3 months for continued evaluation and treatment.   Gardiner Barefoot DPM

## 2016-12-08 DIAGNOSIS — H9193 Unspecified hearing loss, bilateral: Secondary | ICD-10-CM | POA: Diagnosis not present

## 2016-12-08 DIAGNOSIS — H35039 Hypertensive retinopathy, unspecified eye: Secondary | ICD-10-CM | POA: Diagnosis not present

## 2016-12-08 DIAGNOSIS — I130 Hypertensive heart and chronic kidney disease with heart failure and stage 1 through stage 4 chronic kidney disease, or unspecified chronic kidney disease: Secondary | ICD-10-CM | POA: Diagnosis not present

## 2016-12-08 DIAGNOSIS — Z6841 Body Mass Index (BMI) 40.0 and over, adult: Secondary | ICD-10-CM | POA: Diagnosis not present

## 2016-12-08 DIAGNOSIS — I5042 Chronic combined systolic (congestive) and diastolic (congestive) heart failure: Secondary | ICD-10-CM | POA: Diagnosis not present

## 2016-12-08 DIAGNOSIS — E1129 Type 2 diabetes mellitus with other diabetic kidney complication: Secondary | ICD-10-CM | POA: Diagnosis not present

## 2016-12-09 DIAGNOSIS — L439 Lichen planus, unspecified: Secondary | ICD-10-CM | POA: Diagnosis not present

## 2016-12-09 DIAGNOSIS — B360 Pityriasis versicolor: Secondary | ICD-10-CM | POA: Diagnosis not present

## 2016-12-09 DIAGNOSIS — L308 Other specified dermatitis: Secondary | ICD-10-CM | POA: Diagnosis not present

## 2017-01-16 ENCOUNTER — Telehealth: Payer: Self-pay | Admitting: *Deleted

## 2017-01-16 NOTE — Telephone Encounter (Signed)
Faxed CPAP supply order to [facility]. 

## 2017-01-29 ENCOUNTER — Ambulatory Visit: Payer: Medicare HMO | Admitting: *Deleted

## 2017-01-29 DIAGNOSIS — E1165 Type 2 diabetes mellitus with hyperglycemia: Secondary | ICD-10-CM

## 2017-01-29 DIAGNOSIS — E118 Type 2 diabetes mellitus with unspecified complications: Principal | ICD-10-CM

## 2017-01-29 NOTE — Progress Notes (Signed)
Patient ID: Bradley Alvarez, male   DOB: 04-01-1941, 76 y.o.   MRN: [id number]   Patient presents to be scanned and measured for diabetic shoes and inserts.

## 2017-02-04 DIAGNOSIS — G4733 Obstructive sleep apnea (adult) (pediatric): Secondary | ICD-10-CM | POA: Diagnosis not present

## 2017-02-06 DIAGNOSIS — L308 Other specified dermatitis: Secondary | ICD-10-CM | POA: Diagnosis not present

## 2017-02-06 DIAGNOSIS — L439 Lichen planus, unspecified: Secondary | ICD-10-CM | POA: Diagnosis not present

## 2017-02-06 DIAGNOSIS — Z23 Encounter for immunization: Secondary | ICD-10-CM | POA: Diagnosis not present

## 2017-02-06 DIAGNOSIS — B360 Pityriasis versicolor: Secondary | ICD-10-CM | POA: Diagnosis not present

## 2017-02-10 ENCOUNTER — Ambulatory Visit: Payer: Medicare HMO | Admitting: Cardiovascular Disease

## 2017-02-16 ENCOUNTER — Encounter: Payer: Self-pay | Admitting: Cardiovascular Disease

## 2017-02-16 ENCOUNTER — Ambulatory Visit (INDEPENDENT_AMBULATORY_CARE_PROVIDER_SITE_OTHER): Payer: Medicare HMO | Admitting: Cardiovascular Disease

## 2017-02-16 VITALS — BP 144/100 | HR 62 | Ht 74.0 in | Wt 317.0 lb

## 2017-02-16 DIAGNOSIS — I10 Essential (primary) hypertension: Secondary | ICD-10-CM | POA: Diagnosis not present

## 2017-02-16 DIAGNOSIS — E785 Hyperlipidemia, unspecified: Secondary | ICD-10-CM

## 2017-02-16 DIAGNOSIS — G4733 Obstructive sleep apnea (adult) (pediatric): Secondary | ICD-10-CM

## 2017-02-16 DIAGNOSIS — E118 Type 2 diabetes mellitus with unspecified complications: Secondary | ICD-10-CM | POA: Diagnosis not present

## 2017-02-16 DIAGNOSIS — E1165 Type 2 diabetes mellitus with hyperglycemia: Secondary | ICD-10-CM

## 2017-02-16 DIAGNOSIS — Z9989 Dependence on other enabling machines and devices: Secondary | ICD-10-CM

## 2017-02-16 DIAGNOSIS — N183 Chronic kidney disease, stage 3 unspecified: Secondary | ICD-10-CM

## 2017-02-16 NOTE — Progress Notes (Signed)
Patient ID: Bradley Alvarez, male   DOB: 12-20-1940, 76 y.o.   MRN: [id number]    PCP: Dr. [name]  HPI: Bradley Alvarez is a 76 y.o. African-American male who presents to the office for a 6 month follow-up cardiology  evaluation.  Mr. Oki has a history of hypertension, type 2 diabetes mellitus, mixed hyperlipidemia, severe sleep apnea with CPAP therapy, morbid obesity, and documented grade 1 diastolic dysfunction.  He underwent total knee replacement surgery on 12/20/2012 without cardiovascular complications. A nuclear perfusion study in August 2015  showed small apical area of attenuation without ischemia.  Ejection fraction was 53%.  He had 17 teeth pulled in September 2015 and underwent left ankle operation in October 2015.  He has had issues with dyspnea and leg swelling. He has been on carvedilol 25 mg twice a day, Plendil 10 mg daily, clonidine 0.1 mg twice a day, furosemide 20 mg and losartan 100 daily for blood pressure control.  I last saw him, he was having some difficulty with lower extremity edema.  I also reviewed lab work from [employer] which revealed mildly low potassium.  For improved blood pressure control, edema, and his low potassium  I recommended initiation of spironolactone initially at 25 mg daily.  He has been on metformin 1000 g twice a day in addition to [place] for his diabetes mellitus.  Has been taking simvastatin 40 mg for hyperlipidemia..  With reference to his obstructive sleep apnea, he has a new Resmed AirSense 10 AutoSet CPAP unit and is set at a 5 cm and a maximum pressure of 20 cm.  His mask is a [employer] full.  Face mask large.  A download from 03/05/2014 through 04/03/2014:  AHI is excellent at 1.4, with an apnea index of only 0.6.  His 95th percentile pressure was 9.9.  He did not have any central apnea events.  He is 100% compliant, averaging 9 hours and 11 minutes of usage daily.  He denies significant awareness of daytime sleepiness.  He is  unaware of breakthrough snoring.  His most recent Lipid studies from [place] revealed a total cholesterol 131, triglycerides 97, HDL 42, LDL 77.  Hemoglobin A1c was 6.1.  TSH 1.66.  He had evidence for urinary microalbuminurea.  When I saw Mr. Gaughan last year he had noted increasing shortness of breath and lower extremity edema for at least a month.  He had been on furosemide 40 mg daily.  His Lasix was increased to 60 mg daily with improvement over the past several months, Mr. Sagraves has made a dramatic change in his diet.  He also has stopped eating any foods after 8 PM.  When I saw him, he had lost 20 pounds and since that evaluation, he had lost an additional 20 pounds for a 40 pound weight loss since November 2016.  He continues to use CPAP with 100% compliance.  He went to the hospital in June with dehydration and acute kidney injury.  This occurred after a colonoscopy was contributed by his continuing to take Lasix during his bowel prep. His creatinine was 4.65 and serum sodium 126 with a potassium of 2.9.  His renal function ultimately normalized.  While hospitalized, a 2-D echo Doppler study showed an EF of 60-65% with grade 1 diastolic dysfunction.  He had moderate left atrial dilatation.  His right ventricle was mildly dilated.  There was mild AR.  His  ascending aorta measured 45 mm, but was unchanged from 2014.  Since I saw him in September 2017, he admits to 100% compliance with CPAP.  I obtained a download from from her 17 2018 through 02/15/2017.  He is 100% compliance and averaging 6 hours and 22 minutes of usage per day.  He has an air sent 10 AutoSet ResMed unit and his 95th percent pressure is 8.5 with a maximum average pressure of 9.8.  AHI is excellent at 1.6.  He does have significant leak which may be contributed by his beard.  His blood pressure has recently been elevated.  He also admits to some swelling right greater than left.  In his lower extremity. He presents for  reevaluation.  Past Medical History:  Diagnosis Date  . Aortic valve sclerosis   . Benign prostate hyperplasia   . Depression    medication induced  . Diabetes mellitus type 2, uncontrolled, with complications ([place])   . Early cataracts, bilateral   . GERD (gastroesophageal reflux disease)   . Heart murmur    as a young adult  . Heart valve disorder    grade I dyastolic dysfunction  . Hernia of abdominal wall   . Hypercholesteremia   . Hypertension 10/02/11   lexiscan myoview negative for ischemia EF 46%low risk scan.  ECHO 12/07/12 No change in previous echo.  . Left knee DJD   . Lichen planus   . Morbid obesity with BMI of 45.0-49.9, adult ([place])   . 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 Shortness of breath    WITH EXERTION   . Sleep apnea, obstructive 10/14/08   PSA- [facility] AHI duuring total sleep was 61.8/hr and during REMsleep 0.0/hr RDI during total sleep was 96.1/hr. and during REM sleep at 0.0/hr. The average O2 sat range during REM and NREM was 94% The lowest O2 Sat during REM and NREM was 85%  Severe OSA /hypopnea syndrome. Notably events were worse in the supine position and during REM sleep.     Past Surgical History:  Procedure Laterality Date  . CARDIAC CATHETERIZATION    . COLONOSCOPY W/ BIOPSIES AND POLYPECTOMY    . COLONOSCOPY WITH PROPOFOL N/A 05/16/2016   Procedure: COLONOSCOPY WITH PROPOFOL;  Surgeon: Carol Ada, MD;  Location: [facility];  Service: Endoscopy;  Laterality: N/A;  . KNEE ARTHROSCOPY     RT KNEE   . MULTIPLE TOOTH EXTRACTIONS    . ORIF ANKLE FRACTURE Left 09/13/2014   Procedure: Open Reduction Internal Fixation Left Ankle Medial Malleolus;  Surgeon: Newt Minion, MD;  Location: [facility];  Service: Orthopedics;  Laterality: Left;  . SUPRAPUBIC PROSTATECTOMY    . TOTAL KNEE ARTHROPLASTY  08/27/2009   right knee  . TOTAL KNEE ARTHROPLASTY  12/20/2012   Procedure: TOTAL KNEE ARTHROPLASTY;  Surgeon: Lorn Junes,  MD;  Location: [facility];  Service: Orthopedics;  Laterality: Left;  left total knee arthroplasty    No Known Allergies  Current Outpatient Prescriptions  Medication Sig Dispense Refill  . aspirin EC 81 MG tablet Take 81 mg by mouth daily.    . carvedilol (COREG) 25 MG tablet Take 25 mg by mouth 2 (two) times daily with a meal.    . clobetasol ointment (TEMOVATE) 3.71 % Apply 1 application topically daily as needed (for itching).     . cloNIDine (CATAPRES) 0.1 MG tablet Take 0.1 mg by mouth 2 (two) times daily.     . Cyanocobalamin 2500 MCG TABS Take 2,500 mcg by mouth daily.    Marland Kitchen  diclofenac sodium (VOLTAREN) 1 % GEL Apply 1 application topically 4 (four) times daily as needed (For pain.).    Marland Kitchen docusate sodium 100 MG CAPS 1 tab 2 times a day while on narcotics.  STOOL SOFTENER (Patient taking differently: Take 100-300 mg by mouth 2 (two) times daily as needed (for constipation). ) 60 capsule 0  . Dulaglutide (TRULICITY) 1.5 JI/9.6VE SOPN Inject 1 Dose into the skin once a week.    . felodipine (PLENDIL) 5 MG 24 hr tablet Take 10 mg by mouth every morning.     Marland Kitchen FIBER PO Take 1 capsule by mouth daily.    . furosemide (LASIX) 80 MG tablet TAKE 1 TABLET(80 MG) BY MOUTH DAILY 30 tablet 7  . HYDROcodone-acetaminophen (NORCO/VICODIN) 5-325 MG per tablet Take 1 tablet by mouth every 12 (twelve) hours as needed for moderate pain.     Marland Kitchen loratadine (CLARITIN) 10 MG tablet Take 20 mg by mouth every morning.    Marland Kitchen losartan (COZAAR) 100 MG tablet Take 100 tablets by mouth daily.     Marland Kitchen MEGARED OMEGA-3 KRILL OIL PO Take 1 tablet by mouth daily.    . metFORMIN (GLUCOPHAGE) 1000 MG tablet Take 1,000 mg by mouth 2 (two) times daily with a meal.    . Multiple Vitamin (MULTIVITAMIN WITH MINERALS) TABS tablet Take 1 tablet by mouth daily.    Glory Rosebush VERIO test strip USE TO TEST BID AS DIRECTED  2  . potassium chloride (KLOR-CON) 20 MEQ packet Take 20 mEq by mouth daily as needed.    . simvastatin (ZOCOR) 40 MG  tablet Take 40 mg by mouth every evening.    . vitamin C (ASCORBIC ACID) 500 MG tablet Take 500 mg by mouth daily.      No current facility-administered medications for this visit.     Socially he is married and has 5 children. He is not able to exercise routinely. He has remote tobacco history. There is no alcohol. He states he did lose more weight but has regained some weight back.  ROS General: Negative; No fevers, chills, or night sweats;  HEENT: Negative; No changes in vision or hearing, sinus congestion, difficulty swallowing Pulmonary: Negative; No cough, wheezing, shortness of breath, hemoptysis Cardiovascular: Negative; No chest pain, presyncope, syncope, palpitations Positive for edema GI: Negative; No nausea, vomiting, diarrhea, or abdominal pain GU: Negative; No dysuria, hematuria, or difficulty voiding Musculoskeletal: Negative; no myalgias, joint pain, or weakness Hematologic/Oncology: Negative; no easy bruising, bleeding Endocrine: Negative; no heat/cold intolerance; no diabetes Neuro: Negative; no changes in balance, headaches Skin: Negative; No rashes or skin lesions Psychiatric: Negative; No behavioral problems, depression Sleep: Positive for obstructive sleep apnea.  He cannot sleep without it.  He is unaware of breakthrough snoring No daytime sleepiness, hypersomnolence, bruxism, restless legs, hypnogognic hallucinations, no cataplexy Other comprehensive 14 point system review is negative.   PE BP (!) 144/100   Pulse 62   Ht '6\' 2"'  (1.88 m)   Wt (!) 317 lb (143.8 kg)   BMI 40.70 kg/m   Repeat blood pressure 168/96.    Wt Readings from Last 3 Encounters:  02/16/17 (!) 317 lb (143.8 kg)  12/04/16 (!) 308 lb (139.7 kg)  08/12/16 (!) 308 lb (139.7 kg)   General: Alert, oriented, no distress. Morbidly obese. Skin: normal turgor, no rashes HEENT: Normocephalic, atraumatic. Pupils round and reactive; sclera anicteric;no lid lag.  Nose without nasal septal  hypertrophy Mouth/Parynx benign; Mallinpatti scale 4 Neck: Very thick neck; No JVD,  no carotid bruits; normal carotid upstroke Lungs: clear to ausculatation and percussion; no wheezing or rales Chest wall: Nontender to palpation Heart: RRR, s1 s2 normal 0-7/3 systolic murmur.  No S3 or S4 gallop heard.  No diastolic murmur, rubs, thrills or heaves Abdomen: Moderate central adiposity soft, nontender; no hepatosplenomehaly, BS+; abdominal aorta nontender and not dilated by palpation. Back: No CVA tenderness Pulses 2+ Extremities: Edema in his lower extremities bilaterally R>L, but less tense that previously..Trivial right ankle edema.; no clubbing cyanosis, Homan's sign negative  Neurologic: grossly nonfocal Psychological: Normal affect and mood  ECG (independently read by me): Normal sinus rhythm at 63 bpm.  First-degree AV block with a PR interval of 214 ms.  September 2017 ECG (independently read by me): Normal sinus rhythm at 73 bpm.  First-degree AV block with a PR interval 210 ms.  Premature atrial contraction.  January 2017 ECG (independently read by me): Normal sinus rhythm at 64 bpm.  No ectopy.  November 2016 ECG (independently read by me): Normal sinus rhythm at 71 bpm.  Poor anterior R-wave progression.  Normal PR interval at 190 ms.  QTc interval 471 ms.  Prior ECG (independently read by me): Sinus bradycardia with first-degree AV block with PR interval of 216 ms.  No significant ST-T changes.  May 2016 ECG (independently read by me): Normal sinus rhythm with first-degree AV block with a PR interval at 232 ms.  No significant ST changes.  November 2015 ECG (independently read by me): Normal sinus rhythm at 62 bpm.  PR interval 190 ms QTc interval 454 ms.  Prior ECG (independently read by me): Normal sinus rhythm at 75 beats per minute. Borderline first degree AV block with a PR interval of 240 ms. QTC interval normal at 444 ms. No significant ST changes.   Prior ECG from  06/13/2013 : Sinus rhythm at 55 beats per minute. PR interval 202 ms. QTC interval 443 ms.  LABS:  BMP Latest Ref Rng & Units 05/25/2016 05/24/2016 05/23/2016  Glucose 65 - 99 mg/dL 173(H) 165(H) 142(H)  BUN 6 - 20 mg/dL 13 25(H) 37(H)  Creatinine 0.61 - 1.24 mg/dL 1.38(H) 1.90(H) 3.32(H)  Sodium 135 - 145 mmol/L 136 136 132(L)  Potassium 3.5 - 5.1 mmol/L 3.4(L) 3.1(L) 3.0(L)  Chloride 101 - 111 mmol/L 100(L) 100(L) 94(L)  CO2 22 - 32 mmol/L '27 27 26  ' Calcium 8.9 - 10.3 mg/dL 8.8(L) 8.8(L) 8.2(L)   Hepatic Function Latest Ref Rng & Units 05/24/2016 05/17/2016 10/29/2015  Total Protein 6.5 - 8.1 g/dL - 8.0 7.2  Albumin 3.5 - 5.0 g/dL 3.4(L) 4.5 4.5  AST 15 - 41 U/L - 27 23  ALT 17 - 63 U/L - 22 21  Alk Phosphatase 38 - 126 U/L - 40 42  Total Bilirubin 0.3 - 1.2 mg/dL - 0.9 0.7   CBC Latest Ref Rng & Units 05/25/2016 05/23/2016 05/22/2016  WBC 4.0 - 10.5 K/uL 10.1 8.3 10.2  Hemoglobin 13.0 - 17.0 g/dL 10.7(L) 10.5(L) 11.1(L)  Hematocrit 39.0 - 52.0 % 31.1(L) 30.9(L) 32.1(L)  Platelets 150 - 400 K/uL 250 195 176   Lab Results  Component Value Date   MCV 76.6 (L) 05/25/2016   MCV 76.5 (L) 05/23/2016   MCV 76.4 (L) 05/22/2016   Lab Results  Component Value Date   TSH 3.006 10/29/2015    Lipid Panel     Component Value Date/Time   CHOL 144 10/29/2015 0836   TRIG 136 10/29/2015 0836   HDL 55 10/29/2015 0836  CHOLHDL 2.6 10/29/2015 0836   VLDL 27 10/29/2015 0836   LDLCALC 62 10/29/2015 0836     RADIOLOGY: No results found.  IMPRESSION:  1. Essential hypertension   2. Uncontrolled type 2 diabetes mellitus with complication, unspecified long term insulin use status ([place])   3. OSA on CPAP   4. Hyperlipidemia LDL goal <70   5. Morbid obesity ([place])   6. Type 2 diabetes mellitus with complication, without long-term current use of insulin ([facility])   7. Stage 3 chronic kidney disease     ASSESSMENT AND PLAN: Mr. Rodda is a 76 year old African-American gentleman who has a  history of significant morbid obesity and has cardiovascular comorbidities including hypertension with grade 1 diastolic dysfunction, type 2 diabetes mellitus, mixed hyperlipidemia, and severe sleep apnea with CPAP therapy. Over the past year, he has significantly changed his diet and no longer eats after 8 PM.  He is lost over 40 pounds.  He had undergone a colonoscopy this summer following his extensive bowel prep while continuing to take his diuretic regimen.  He developed acute kidney injury leading to discontinuance of his diuretic.  While he was hospitalized in June 2017 a follow-up echo Doppler study showed an EF of 60-65% with grade 1 diastolic dysfunction.  His blood pressure today is elevated and I have recommended titration of losartan to 100 mg from his present dose of 50 mg.  I have recommended he change is furosemide intake 20 mg daily.  He continues to be on felodipine, clonidine, and carvedilol.  I will recheck a be met after 1 week of increased ARB therapy.  He has continued to be on simvastatin for hyperlipidemia.  He is diabetic on metformin.  I reviewed his download with him in detail.  His AHI is excellent despite having a significant mask leak.  I suspect this is related to his beard which may need to be trimmed or shaved.  I will be following up with Dr. Virgina Jock in several months.  I will see him in 6 months for cardiology reevaluation.    Time spent: 25 minutes  Troy Sine, MD, [facility]  02/18/2017 7:08 PM

## 2017-02-16 NOTE — Patient Instructions (Signed)
Your physician has recommended you make the following change in your medication:   1.) Dr Claiborne Billings requests that you take 100 mg of Losartan daily.  Your physician recommends that you return for lab work in: 2 weeks.  Your physician recommends that you keep your follow-up appointment that's scheduled with Dr Virgina Jock in May.  Your physician wants you to follow-up in: AUGUST-September to see Dr Claiborne Billings. You will receive a reminder letter in the mail two months in advance. If you don't receive a letter, please call our office to schedule the follow-up appointment.

## 2017-02-26 ENCOUNTER — Ambulatory Visit: Payer: Medicare Other | Admitting: Podiatry

## 2017-02-27 ENCOUNTER — Telehealth: Payer: Self-pay | Admitting: *Deleted

## 2017-02-27 ENCOUNTER — Encounter: Payer: Self-pay | Admitting: Podiatry

## 2017-02-27 ENCOUNTER — Ambulatory Visit (INDEPENDENT_AMBULATORY_CARE_PROVIDER_SITE_OTHER): Payer: Medicare HMO | Admitting: Podiatry

## 2017-02-27 DIAGNOSIS — B351 Tinea unguium: Secondary | ICD-10-CM | POA: Diagnosis not present

## 2017-02-27 DIAGNOSIS — M79676 Pain in unspecified toe(s): Secondary | ICD-10-CM | POA: Diagnosis not present

## 2017-02-27 DIAGNOSIS — E119 Type 2 diabetes mellitus without complications: Secondary | ICD-10-CM

## 2017-02-27 DIAGNOSIS — M2010 Hallux valgus (acquired), unspecified foot: Secondary | ICD-10-CM

## 2017-02-27 NOTE — Progress Notes (Signed)
Patient ID: Bradley Alvarez, male   DOB: Mar 24, 1941, 76 y.o.   MRN: [id number] Complaint:  Visit Type: Patient returns to my office for continued preventative foot care services. Complaint: Patient states" my nails have grown long and thick and become painful to walk and wear shoes" Patient has been diagnosed with DM with no complications. He presents for preventative foot care services. Eye surgery eyes B/L.  Podiatric Exam: Vascular: dorsalis pedis and posterior tibial pulses are palpable bilateral. Capillary return is immediate. Temperature gradient is WNL. Skin turgor WNL  Sensorium: Normal Semmes Weinstein monofilament test. Normal tactile sensation bilaterally. Nail Exam: Pt has thick disfigured discolored nails with subungual debris noted bilateral entire nail hallux through fifth toenails Ulcer Exam: There is no evidence of ulcer or pre-ulcerative changes or infection. Orthopedic Exam: Muscle tone and strength are WNL. No limitations in general ROM. No crepitus or effusions noted. PTT tendinitis/arthritis left foot.  HAV  B/L.  Pes planus B/L Skin: No Porokeratosis. No infection or ulcers  Diagnosis:  Tinea unguium, Pain in right toe, pain in left toes  Treatment & Plan Procedures and Treatment: Consent by patient was obtained for treatment procedures. The patient understood the discussion of treatment and procedures well. All questions were answered thoroughly reviewed. Debridement of mycotic and hypertrophic toenails, 1 through 5 bilateral and clearing of subungual debris. No ulceration, no infection noted. .To check on diabetic shoe paperwork. Return Visit-Office Procedure: Patient instructed to return to the office for a follow up visit 3 months for continued evaluation and treatment.   Gardiner Barefoot DPM

## 2017-03-02 ENCOUNTER — Ambulatory Visit (INDEPENDENT_AMBULATORY_CARE_PROVIDER_SITE_OTHER): Payer: Medicare HMO

## 2017-03-03 DIAGNOSIS — I10 Essential (primary) hypertension: Secondary | ICD-10-CM | POA: Diagnosis not present

## 2017-03-03 LAB — BASIC METABOLIC PANEL
BUN: 12 mg/dL (ref 7–25)
CALCIUM: 8.8 mg/dL (ref 8.6–10.3)
CO2: 31 mmol/L (ref 20–31)
CREATININE: 1.14 mg/dL (ref 0.70–1.18)
Chloride: 102 mmol/L (ref 98–110)
Glucose, Bld: 106 mg/dL — ABNORMAL HIGH (ref 65–99)
POTASSIUM: 3.2 mmol/L — AB (ref 3.5–5.3)
Sodium: 142 mmol/L (ref 135–146)

## 2017-03-04 ENCOUNTER — Telehealth: Payer: Self-pay | Admitting: *Deleted

## 2017-03-04 NOTE — Telephone Encounter (Signed)
-----   Message from Troy Sine, MD sent at 03/04/2017  8:02 AM EDT ----- K is low; take 40 meq KCL x 2 days then 20 meq daily

## 2017-03-04 NOTE — Telephone Encounter (Signed)
Patient and wife notified of results and recommendations. Voiced understanding.

## 2017-03-10 DIAGNOSIS — R05 Cough: Secondary | ICD-10-CM | POA: Diagnosis not present

## 2017-03-10 DIAGNOSIS — I429 Cardiomyopathy, unspecified: Secondary | ICD-10-CM | POA: Diagnosis not present

## 2017-03-10 DIAGNOSIS — E1129 Type 2 diabetes mellitus with other diabetic kidney complication: Secondary | ICD-10-CM | POA: Diagnosis not present

## 2017-03-10 DIAGNOSIS — I130 Hypertensive heart and chronic kidney disease with heart failure and stage 1 through stage 4 chronic kidney disease, or unspecified chronic kidney disease: Secondary | ICD-10-CM | POA: Diagnosis not present

## 2017-03-10 DIAGNOSIS — J310 Chronic rhinitis: Secondary | ICD-10-CM | POA: Diagnosis not present

## 2017-03-19 NOTE — Telephone Encounter (Signed)
complete

## 2017-03-19 NOTE — Telephone Encounter (Signed)
error 

## 2017-03-30 DIAGNOSIS — Z79899 Other long term (current) drug therapy: Secondary | ICD-10-CM | POA: Diagnosis not present

## 2017-03-30 DIAGNOSIS — I509 Heart failure, unspecified: Secondary | ICD-10-CM | POA: Diagnosis not present

## 2017-03-30 DIAGNOSIS — J302 Other seasonal allergic rhinitis: Secondary | ICD-10-CM | POA: Diagnosis not present

## 2017-03-30 DIAGNOSIS — Z7982 Long term (current) use of aspirin: Secondary | ICD-10-CM | POA: Diagnosis not present

## 2017-03-30 DIAGNOSIS — I110 Hypertensive heart disease with heart failure: Secondary | ICD-10-CM | POA: Diagnosis not present

## 2017-03-30 DIAGNOSIS — E1142 Type 2 diabetes mellitus with diabetic polyneuropathy: Secondary | ICD-10-CM | POA: Diagnosis not present

## 2017-03-30 DIAGNOSIS — R0609 Other forms of dyspnea: Secondary | ICD-10-CM | POA: Diagnosis not present

## 2017-03-30 DIAGNOSIS — K08109 Complete loss of teeth, unspecified cause, unspecified class: Secondary | ICD-10-CM | POA: Diagnosis not present

## 2017-03-30 DIAGNOSIS — R21 Rash and other nonspecific skin eruption: Secondary | ICD-10-CM | POA: Diagnosis not present

## 2017-03-30 DIAGNOSIS — R600 Localized edema: Secondary | ICD-10-CM | POA: Diagnosis not present

## 2017-03-30 DIAGNOSIS — L439 Lichen planus, unspecified: Secondary | ICD-10-CM | POA: Diagnosis not present

## 2017-03-30 DIAGNOSIS — H9113 Presbycusis, bilateral: Secondary | ICD-10-CM | POA: Diagnosis not present

## 2017-03-30 DIAGNOSIS — Z9849 Cataract extraction status, unspecified eye: Secondary | ICD-10-CM | POA: Diagnosis not present

## 2017-03-30 DIAGNOSIS — Z6841 Body Mass Index (BMI) 40.0 and over, adult: Secondary | ICD-10-CM | POA: Diagnosis not present

## 2017-03-30 DIAGNOSIS — Z7984 Long term (current) use of oral hypoglycemic drugs: Secondary | ICD-10-CM | POA: Diagnosis not present

## 2017-03-30 DIAGNOSIS — E785 Hyperlipidemia, unspecified: Secondary | ICD-10-CM | POA: Diagnosis not present

## 2017-03-30 DIAGNOSIS — Z79891 Long term (current) use of opiate analgesic: Secondary | ICD-10-CM | POA: Diagnosis not present

## 2017-03-30 DIAGNOSIS — Z0000 Encounter for general adult medical examination without abnormal findings: Secondary | ICD-10-CM | POA: Diagnosis not present

## 2017-03-30 DIAGNOSIS — Z87891 Personal history of nicotine dependence: Secondary | ICD-10-CM | POA: Diagnosis not present

## 2017-04-07 DIAGNOSIS — E1129 Type 2 diabetes mellitus with other diabetic kidney complication: Secondary | ICD-10-CM | POA: Diagnosis not present

## 2017-04-07 DIAGNOSIS — I10 Essential (primary) hypertension: Secondary | ICD-10-CM | POA: Diagnosis not present

## 2017-04-07 DIAGNOSIS — Z125 Encounter for screening for malignant neoplasm of prostate: Secondary | ICD-10-CM | POA: Diagnosis not present

## 2017-04-07 DIAGNOSIS — E784 Other hyperlipidemia: Secondary | ICD-10-CM | POA: Diagnosis not present

## 2017-04-14 ENCOUNTER — Other Ambulatory Visit: Payer: Medicare HMO

## 2017-04-14 DIAGNOSIS — M14672 Charcot's joint, left ankle and foot: Secondary | ICD-10-CM | POA: Diagnosis not present

## 2017-04-14 DIAGNOSIS — H9190 Unspecified hearing loss, unspecified ear: Secondary | ICD-10-CM | POA: Diagnosis not present

## 2017-04-14 DIAGNOSIS — K635 Polyp of colon: Secondary | ICD-10-CM | POA: Diagnosis not present

## 2017-04-14 DIAGNOSIS — Z0000 Encounter for general adult medical examination without abnormal findings: Secondary | ICD-10-CM | POA: Diagnosis not present

## 2017-04-14 DIAGNOSIS — I5042 Chronic combined systolic (congestive) and diastolic (congestive) heart failure: Secondary | ICD-10-CM | POA: Diagnosis not present

## 2017-04-14 DIAGNOSIS — E1129 Type 2 diabetes mellitus with other diabetic kidney complication: Secondary | ICD-10-CM | POA: Diagnosis not present

## 2017-04-14 DIAGNOSIS — M2010 Hallux valgus (acquired), unspecified foot: Secondary | ICD-10-CM | POA: Diagnosis not present

## 2017-04-14 DIAGNOSIS — I130 Hypertensive heart and chronic kidney disease with heart failure and stage 1 through stage 4 chronic kidney disease, or unspecified chronic kidney disease: Secondary | ICD-10-CM | POA: Diagnosis not present

## 2017-04-14 DIAGNOSIS — M2142 Flat foot [pes planus] (acquired), left foot: Secondary | ICD-10-CM | POA: Diagnosis not present

## 2017-04-14 DIAGNOSIS — I428 Other cardiomyopathies: Secondary | ICD-10-CM | POA: Diagnosis not present

## 2017-04-14 DIAGNOSIS — N183 Chronic kidney disease, stage 3 (moderate): Secondary | ICD-10-CM | POA: Diagnosis not present

## 2017-04-14 DIAGNOSIS — E1140 Type 2 diabetes mellitus with diabetic neuropathy, unspecified: Secondary | ICD-10-CM | POA: Diagnosis not present

## 2017-04-14 DIAGNOSIS — H35039 Hypertensive retinopathy, unspecified eye: Secondary | ICD-10-CM | POA: Diagnosis not present

## 2017-04-15 DIAGNOSIS — Z1212 Encounter for screening for malignant neoplasm of rectum: Secondary | ICD-10-CM | POA: Diagnosis not present

## 2017-06-05 ENCOUNTER — Encounter: Payer: Self-pay | Admitting: Podiatry

## 2017-06-05 ENCOUNTER — Ambulatory Visit (INDEPENDENT_AMBULATORY_CARE_PROVIDER_SITE_OTHER): Payer: Medicare HMO | Admitting: Podiatry

## 2017-06-05 DIAGNOSIS — B351 Tinea unguium: Secondary | ICD-10-CM | POA: Diagnosis not present

## 2017-06-05 DIAGNOSIS — M79676 Pain in unspecified toe(s): Secondary | ICD-10-CM | POA: Diagnosis not present

## 2017-06-05 DIAGNOSIS — E119 Type 2 diabetes mellitus without complications: Secondary | ICD-10-CM | POA: Diagnosis not present

## 2017-06-05 DIAGNOSIS — M2010 Hallux valgus (acquired), unspecified foot: Secondary | ICD-10-CM

## 2017-06-05 NOTE — Progress Notes (Signed)
Patient ID: Bradley Alvarez, male   DOB: [birth year], 76 y.o.   MRN: [id number] Complaint:  Visit Type: Patient returns to my office for continued preventative foot care services. Complaint: Patient states" my nails have grown long and thick and become painful to walk and wear shoes" Patient has been diagnosed with DM with no complications. He presents for preventative foot care services. Eye surgery eyes B/L.  Podiatric Exam: Vascular: dorsalis pedis and posterior tibial pulses are palpable bilateral. Capillary return is immediate. Temperature gradient is WNL. Skin turgor WNL  Sensorium: Normal Semmes Weinstein monofilament test. Normal tactile sensation bilaterally. Nail Exam: Pt has thick disfigured discolored nails with subungual debris noted bilateral entire nail hallux through fifth toenails Ulcer Exam: There is no evidence of ulcer or pre-ulcerative changes or infection. Orthopedic Exam: Muscle tone and strength are WNL. No limitations in general ROM. No crepitus or effusions noted. PTT tendinitis/arthritis left foot.  HAV  B/L.  Pes planus B/L Skin: No Porokeratosis. No infection or ulcers  Diagnosis:  Tinea unguium, Pain in right toe, pain in left toes  Treatment & Plan Procedures and Treatment: Consent by patient was obtained for treatment procedures. The patient understood the discussion of treatment and procedures well. All questions were answered thoroughly reviewed. Debridement of mycotic and hypertrophic toenails, 1 through 5 bilateral and clearing of subungual debris. No ulceration, no infection noted. .To check on diabetic shoe paperwork Return Visit-Office Procedure: Patient instructed to return to the office for a follow up visit 3 months for continued evaluation and treatment.   Gardiner Barefoot DPM

## 2017-06-09 DIAGNOSIS — H04123 Dry eye syndrome of bilateral lacrimal glands: Secondary | ICD-10-CM | POA: Diagnosis not present

## 2017-06-09 DIAGNOSIS — H40013 Open angle with borderline findings, low risk, bilateral: Secondary | ICD-10-CM | POA: Diagnosis not present

## 2017-08-07 DIAGNOSIS — I130 Hypertensive heart and chronic kidney disease with heart failure and stage 1 through stage 4 chronic kidney disease, or unspecified chronic kidney disease: Secondary | ICD-10-CM | POA: Diagnosis not present

## 2017-08-07 DIAGNOSIS — Z6841 Body Mass Index (BMI) 40.0 and over, adult: Secondary | ICD-10-CM | POA: Diagnosis not present

## 2017-08-07 DIAGNOSIS — Z23 Encounter for immunization: Secondary | ICD-10-CM | POA: Diagnosis not present

## 2017-08-07 DIAGNOSIS — H35039 Hypertensive retinopathy, unspecified eye: Secondary | ICD-10-CM | POA: Diagnosis not present

## 2017-08-07 DIAGNOSIS — E1129 Type 2 diabetes mellitus with other diabetic kidney complication: Secondary | ICD-10-CM | POA: Diagnosis not present

## 2017-08-07 DIAGNOSIS — I5042 Chronic combined systolic (congestive) and diastolic (congestive) heart failure: Secondary | ICD-10-CM | POA: Diagnosis not present

## 2017-08-25 DIAGNOSIS — G4733 Obstructive sleep apnea (adult) (pediatric): Secondary | ICD-10-CM | POA: Diagnosis not present

## 2017-09-04 ENCOUNTER — Ambulatory Visit (INDEPENDENT_AMBULATORY_CARE_PROVIDER_SITE_OTHER): Payer: Medicare HMO | Admitting: Podiatry

## 2017-09-04 ENCOUNTER — Encounter: Payer: Self-pay | Admitting: Podiatry

## 2017-09-04 ENCOUNTER — Telehealth: Payer: Self-pay | Admitting: *Deleted

## 2017-09-04 DIAGNOSIS — B351 Tinea unguium: Secondary | ICD-10-CM | POA: Diagnosis not present

## 2017-09-04 DIAGNOSIS — M79675 Pain in left toe(s): Secondary | ICD-10-CM

## 2017-09-04 DIAGNOSIS — M79674 Pain in right toe(s): Secondary | ICD-10-CM | POA: Diagnosis not present

## 2017-09-04 NOTE — Progress Notes (Signed)
Patient ID: Bradley Alvarez, male   DOB: Oct 30, 1941, 76 y.o.   MRN: [id number] Complaint:  Visit Type: Patient returns to my office for continued preventative foot care services. Complaint: Patient states" my nails have grown long and thick and become painful to walk and wear shoes" Patient has been diagnosed with DM with no complications. He presents for preventative foot care services. Eye surgery eyes B/L.  Podiatric Exam: Vascular: dorsalis pedis and posterior tibial pulses are palpable bilateral. Capillary return is immediate. Temperature gradient is WNL. Skin turgor WNL  Sensorium: Normal Semmes Weinstein monofilament test. Normal tactile sensation bilaterally. Nail Exam: Pt has thick disfigured discolored nails with subungual debris noted bilateral entire nail hallux through fifth toenails Ulcer Exam: There is no evidence of ulcer or pre-ulcerative changes or infection. Orthopedic Exam: Muscle tone and strength are WNL. No limitations in general ROM. No crepitus or effusions noted. PTT tendinitis/arthritis left foot.  HAV  B/L.  Pes planus B/L Skin: No Porokeratosis. No infection or ulcers  Diagnosis:  Tinea unguium, Pain in right toe, pain in left toes,  HAV  B/L  PTT tendinitis  Pes planus  Treatment & Plan Procedures and Treatment: Consent by patient was obtained for treatment procedures. The patient understood the discussion of treatment and procedures well. All questions were answered thoroughly reviewed. Debridement of mycotic and hypertrophic toenails, 1 through 5 bilateral and clearing of subungual debris. No ulceration, no infection noted.  Return Visit-Office Procedure: Patient instructed to return to the office for a follow up visit 3 months for continued evaluation and treatment.   Gardiner Barefoot DPM

## 2017-09-04 NOTE — Addendum Note (Signed)
Addended byDeidre Ala, Geanie Pacifico L on: 09/04/2017 11:11 AM   Modules accepted: Orders

## 2017-09-04 NOTE — Telephone Encounter (Signed)
CPAP supply orders signed by Dr. Claiborne Billings and returned to [facility]

## 2017-09-10 ENCOUNTER — Ambulatory Visit (INDEPENDENT_AMBULATORY_CARE_PROVIDER_SITE_OTHER): Payer: Medicare HMO | Admitting: Cardiovascular Disease

## 2017-09-10 ENCOUNTER — Encounter: Payer: Self-pay | Admitting: Cardiovascular Disease

## 2017-09-10 VITALS — BP 168/94 | HR 67 | Ht 74.0 in | Wt 332.8 lb

## 2017-09-10 DIAGNOSIS — Z794 Long term (current) use of insulin: Secondary | ICD-10-CM | POA: Diagnosis not present

## 2017-09-10 DIAGNOSIS — N183 Chronic kidney disease, stage 3 unspecified: Secondary | ICD-10-CM

## 2017-09-10 DIAGNOSIS — Z79899 Other long term (current) drug therapy: Secondary | ICD-10-CM | POA: Diagnosis not present

## 2017-09-10 DIAGNOSIS — R600 Localized edema: Secondary | ICD-10-CM | POA: Diagnosis not present

## 2017-09-10 DIAGNOSIS — Z6841 Body Mass Index (BMI) 40.0 and over, adult: Secondary | ICD-10-CM | POA: Diagnosis not present

## 2017-09-10 DIAGNOSIS — G4733 Obstructive sleep apnea (adult) (pediatric): Secondary | ICD-10-CM

## 2017-09-10 DIAGNOSIS — E785 Hyperlipidemia, unspecified: Secondary | ICD-10-CM

## 2017-09-10 DIAGNOSIS — E119 Type 2 diabetes mellitus without complications: Secondary | ICD-10-CM

## 2017-09-10 DIAGNOSIS — I10 Essential (primary) hypertension: Secondary | ICD-10-CM | POA: Diagnosis not present

## 2017-09-10 MED ORDER — CLONIDINE HCL 0.2 MG PO TABS: 0.2000 mg | ORAL_TABLET | Freq: Two times a day (BID) | ORAL | 3 refills | Status: DC

## 2017-09-10 NOTE — Patient Instructions (Signed)
Medication Instructions:  INCREASE clonidine to 0.2 mg (1 tablet) two times daily  Labwork: TODAY (CMET, CBC, TSH, Lipid, A1C)  Follow-Up: Your physician wants you to follow-up in: 6 MONTHS with Dr. Claiborne Billings. You will receive a reminder letter in the mail two months in advance. If you don't receive a letter, please call our office to schedule the follow-up appointment.   Any Other Special Instructions Will Be Listed Below (If Applicable).     If you need a refill on your cardiac medications before your next appointment, please call your pharmacy.

## 2017-09-10 NOTE — Progress Notes (Signed)
Patient ID: Bradley Alvarez, male   DOB: August 11, 1941, 76 y.o.   MRN: [id number]    PCP: Dr. [name]  HPI: Bradley Alvarez is a 76 y.o. African-American male who presents to the office for a 7 month follow-up cardiology  evaluation.  Bradley Alvarez has a history of hypertension, type 2 diabetes mellitus, mixed hyperlipidemia, severe sleep apnea with CPAP therapy, morbid obesity, and documented grade 1 diastolic dysfunction.  He underwent total knee replacement surgery on 12/20/2012 without cardiovascular complications. A nuclear perfusion study in August 2015  showed small apical area of attenuation without ischemia.  Ejection fraction was 53%.  He had 17 teeth pulled in September 2015 and underwent left ankle operation in October 2015.  He has had issues with dyspnea and leg swelling. He has been on carvedilol 25 mg twice a day, Plendil 10 mg daily, clonidine 0.1 mg twice a day, furosemide 20 mg and losartan 100 daily for blood pressure control.  I last saw him, he was having some difficulty with lower extremity edema.  I also reviewed lab work from [employer] which revealed mildly low potassium.  For improved blood pressure control, edema, and his low potassium  I recommended initiation of spironolactone initially at 25 mg daily.  He has been on metformin 1000 g twice a day in addition to [place] for his diabetes mellitus.  Has been taking simvastatin 40 mg for hyperlipidemia..  With reference to his obstructive sleep apnea, he received a new Resmed AirSense 10 AutoSet CPAP unit and is set at a 5 cm and a maximum pressure of 20 cm.  His mask is a [employer] full.  Face mask large.  A download from 03/05/2014 through 04/03/2014:  AHI is excellent at 1.4, with an apnea index of only 0.6.  His 95th percentile pressure was 9.9.  He did not have any central apnea events.  He is 100% compliant, averaging 9 hours and 11 minutes of usage daily.  He denies significant awareness of daytime sleepiness.   He is unaware of breakthrough snoring.  His most recent Lipid studies from [place] revealed a total cholesterol 131, triglycerides 97, HDL 42, LDL 77.  Hemoglobin A1c was 6.1.  TSH 1.66.  He had evidence for urinary microalbuminurea.  When I saw Bradley Alvarez last year he had noted increasing shortness of breath and lower extremity edema for at least a month.  He had been on furosemide 40 mg daily.  His Lasix was increased to 60 mg daily with improvement over the past several months, Bradley Alvarez has made a dramatic change in his diet.  He also has stopped eating any foods after 8 PM.  When I saw him, he had lost 20 pounds and since that evaluation, he had lost an additional 20 pounds for a 40 pound weight loss since November 2016.  He continues to use CPAP with 100% compliance.  He went to the hospital in June with dehydration and acute kidney injury.  This occurred after a colonoscopy was contributed by his continuing to take Lasix during his bowel prep. His creatinine was 4.65 and serum sodium 126 with a potassium of 2.9.  His renal function ultimately normalized.  While hospitalized, a 2-D echo Doppler study showed an EF of 60-65% with grade 1 diastolic dysfunction.  He had moderate left atrial dilatation.  His right ventricle was mildly dilated.  There was mild AR.  His  ascending aorta measured 45 mm, but was unchanged from 2014.  When I saw him in September 2017, he admits to 100% compliance with CPAP.  I obtained a download from from her 17 2018 through 02/15/2017.  He is 100% compliance and averaging 6 hours and 22 minutes of usage per day.  He has an air sent 10 AutoSet ResMed unit and his 95th percent pressure is 8.5 with a maximum average pressure of 9.8.  AHI is excellent at 1.6.  He does have significant leak which may be contributed by his beard.  His blood pressure has recently been elevated.  He also admits to some lower extremity swelling right greater than left.   At his last  office visit in March 2018 his blood pressure was elevated and I recommended titration of losartan to 100 mg daily from 50 mg and recommended he increase furosemide and take 20 mg daily.  Over last 7 months, he has felt fairly well.  He continues 6.  Ankle swelling right greater than left.  He's had difficulty with arthritis in his back, making it difficult to walk.  He has not had recent fasting laboratory.  He continues to use CPAP.  I was able to obtain a download in the office today from September 11 through 09/10/2007.  He is 100% compliant.  He had significant leak in his mask, but over the past 2 weeks.  This improved when he received a new mask.  His 95th percentile pressure was 7.3 with a maximum average pressure of 8.3.  He denies daytime sleepiness.  Epworth scale is calculated at 7.  He presents for evaluation  Past Medical History:  Diagnosis Date  . Aortic valve sclerosis   . Benign prostate hyperplasia   . Depression    medication induced  . Diabetes mellitus type 2, uncontrolled, with complications ([place])   . Early cataracts, bilateral   . GERD (gastroesophageal reflux disease)   . Heart murmur    as a young adult  . Heart valve disorder    grade I dyastolic dysfunction  . Hernia of abdominal wall   . Hypercholesteremia   . Hypertension 10/02/11   lexiscan myoview negative for ischemia EF 46%low risk scan.  ECHO 12/07/12 No change in previous echo.  . Left knee DJD   . Lichen planus   . Morbid obesity with BMI of 45.0-49.9, adult ([place])   . Neuropathy    due to diabetes  . Shortness of breath    WITH EXERTION   . Sleep apnea, obstructive 10/14/08   PS[facility]- [facility] AHI duuring total sleep was 61.8/hr and during REMsleep 0.0/hr RDI during total sleep was 96.1/hr. and during REM sleep at 0.0/hr. The average O2 sat range during REM and NREM was 94% The lowest O2 Sat during REM and NREM was 85%  Severe OSA /hypopnea syndrome. Notably events were worse in the  supine position and during REM sleep.     Past Surgical History:  Procedure Laterality Date  . CARDIAC CATHETERIZATION    . COLONOSCOPY W/ BIOPSIES AND POLYPECTOMY    . COLONOSCOPY WITH PROPOFOL N/A 05/16/2016   Procedure: COLONOSCOPY WITH PROPOFOL;  Surgeon: Carol Ada, MD;  Location: [facility];  Service: Endoscopy;  Laterality: N/A;  . KNEE ARTHROSCOPY     RT KNEE   . MULTIPLE TOOTH EXTRACTIONS    . ORIF ANKLE FRACTURE Left 09/13/2014   Procedure: Open Reduction Internal Fixation Left Ankle Medial Malleolus;  Surgeon: Newt Minion, MD;  Location: [facility];  Service: Orthopedics;  Laterality:  Left;  . SUPRAPUBIC PROSTATECTOMY    . TOTAL KNEE ARTHROPLASTY  08/27/2009   right knee  . TOTAL KNEE ARTHROPLASTY  12/20/2012   Procedure: TOTAL KNEE ARTHROPLASTY;  Surgeon: Lorn Junes, MD;  Location: [facility];  Service: Orthopedics;  Laterality: Left;  left total knee arthroplasty    No Known Allergies  Current Outpatient Prescriptions  Medication Sig Dispense Refill  . aspirin EC 81 MG tablet Take 81 mg by mouth daily.    . carvedilol (COREG) 25 MG tablet Take 25 mg by mouth 2 (two) times daily with a meal.    . clobetasol ointment (TEMOVATE) 7.59 % Apply 1 application topically daily as needed (for itching).     . cloNIDine (CATAPRES) 0.2 MG tablet Take 1 tablet (0.2 mg total) by mouth 2 (two) times daily. 180 tablet 3  . Cyanocobalamin 2500 MCG TABS Take 2,500 mcg by mouth daily.    . diclofenac sodium (VOLTAREN) 1 % GEL Apply 1 application topically 4 (four) times daily as needed (For pain.).    Marland Kitchen docusate sodium 100 MG CAPS 1 tab 2 times a day while on narcotics.  STOOL SOFTENER 60 capsule 0  . Dulaglutide (TRULICITY) 1.5 FM/3.8GY SOPN Inject 1 Dose into the skin once a week.    . felodipine (PLENDIL) 5 MG 24 hr tablet Take 10 mg by mouth every morning.     Marland Kitchen FIBER PO Take 1 capsule by mouth daily.    . furosemide (LASIX) 20 MG tablet Take 20 mg by mouth as directed.    Marland Kitchen  HYDROcodone-acetaminophen (NORCO/VICODIN) 5-325 MG per tablet Take 1 tablet by mouth every 12 (twelve) hours as needed for moderate pain.     Marland Kitchen loratadine (CLARITIN) 10 MG tablet Take 20 mg by mouth every morning.    Marland Kitchen losartan (COZAAR) 100 MG tablet     . MEGARED OMEGA-3 KRILL OIL PO Take 1 tablet by mouth daily.    . metFORMIN (GLUCOPHAGE) 1000 MG tablet Take 1,000 mg by mouth 2 (two) times daily with a meal.    . Multiple Vitamin (MULTIVITAMIN WITH MINERALS) TABS tablet Take 1 tablet by mouth daily.    Glory Rosebush VERIO test strip USE TO TEST BID AS DIRECTED  2  . potassium chloride SA (K-DUR,KLOR-CON) 20 MEQ tablet Take 20 mEq by mouth as directed.     . simvastatin (ZOCOR) 40 MG tablet Take 40 mg by mouth every evening.    . urea (CARMOL) 10 % cream Apply topically as needed.    . vitamin C (ASCORBIC ACID) 500 MG tablet Take 500 mg by mouth daily.      No current facility-administered medications for this visit.     Socially he is married and has 5 children. He is not able to exercise routinely. He has remote tobacco history. There is no alcohol. He states he did lose more weight but has regained some weight back.  ROS General: Negative; No fevers, chills, or night sweats;  HEENT: Negative; No changes in vision or hearing, sinus congestion, difficulty swallowing Pulmonary: Negative; No cough, wheezing, shortness of breath, hemoptysis Cardiovascular: Negative; No chest pain, presyncope, syncope, palpitations Positive for edema GI: Negative; No nausea, vomiting, diarrhea, or abdominal pain GU: Negative; No dysuria, hematuria, or difficulty voiding Musculoskeletal: Negative; no myalgias, joint pain, or weakness Hematologic/Oncology: Negative; no easy bruising, bleeding Endocrine: Negative; no heat/cold intolerance; no diabetes Neuro: Negative; no changes in balance, headaches Skin: Negative; No rashes or skin lesions Psychiatric: Negative; No  behavioral problems, depression Sleep:  Positive for obstructive sleep apnea.  He cannot sleep without it.  He is unaware of breakthrough snoring No daytime sleepiness, hypersomnolence, bruxism, restless legs, hypnogognic hallucinations, no cataplexy Other comprehensive 14 point system review is negative.   PE BP (!) 168/94   Pulse 67   Ht '6\' 2"'  (1.88 m)   Wt (!) 332 lb 12.8 oz (151 kg)   BMI 42.73 kg/m    Repeat blood pressure today by me was elevated at 170/92.  Wt Readings from Last 3 Encounters:  09/10/17 (!) 332 lb 12.8 oz (151 kg)  02/16/17 (!) 317 lb (143.8 kg)  12/04/16 (!) 308 lb (139.7 kg)   General: Alert, oriented, no distress. Morbidly obese Skin: normal turgor, no rashes, warm and dry HEENT: Normocephalic, atraumatic. Pupils equal round and reactive to light; sclera anicteric; extraocular muscles intact;  Nose without nasal septal hypertrophy Mouth/Parynx benign; Mallinpatti scale 4 Neck: No JVD, no carotid bruits; normal carotid upstroke Lungs: clear to ausculatation and percussion; no wheezing or rales Chest wall: without tenderness to palpitation Heart: PMI not displaced, RRR, s1 s2 normal, 1/6 systolic murmur, no diastolic murmur, no rubs, gallops, thrills, or heaves Abdomen: soft, nontender; no hepatosplenomehaly, BS+; abdominal aorta nontender and not dilated by palpation. Back: no CVA tenderness Pulses 2+ Musculoskeletal: full range of motion, normal strength, no joint deformities Extremities: Right ankle lower extremity edema, right greater than left; no clubbing ,cyanosis,  Homan's sign negative  Neurologic: grossly nonfocal; Cranial nerves grossly wnl Psychologic: Normal mood and affect   ECG (independently read by me): Normal sinus rhythm at 67 bpm.He segment changes.  QTc interval 469 ms.  March 2018 ECG (independently read by me): Normal sinus rhythm at 63 bpm.  First-degree AV block with a PR interval of 214 ms.  September 2017 ECG (independently read by me): Normal sinus rhythm at 73  bpm.  First-degree AV block with a PR interval 210 ms.  Premature atrial contraction.  January 2017 ECG (independently read by me): Normal sinus rhythm at 64 bpm.  No ectopy.  November 2016 ECG (independently read by me): Normal sinus rhythm at 71 bpm.  Poor anterior R-wave progression.  Normal PR interval at 190 ms.  QTc interval 471 ms.  Prior ECG (independently read by me): Sinus bradycardia with first-degree AV block with PR interval of 216 ms.  No significant ST-T changes.  May 2016 ECG (independently read by me): Normal sinus rhythm with first-degree AV block with a PR interval at 232 ms.  No significant ST changes.  November 2015 ECG (independently read by me): Normal sinus rhythm at 62 bpm.  PR interval 190 ms QTc interval 454 ms.  Prior ECG (independently read by me): Normal sinus rhythm at 75 beats per minute. Borderline first degree AV block with a PR interval of 240 ms. QTC interval normal at 444 ms. No significant ST changes.   Prior ECG from 06/13/2013 : Sinus rhythm at 55 beats per minute. PR interval 202 ms. QTC interval 443 ms.  LABS:  BMP Latest Ref Rng & Units 09/10/2017 03/03/2017 05/25/2016  Glucose 65 - 99 mg/dL 118(H) 106(H) 173(H)  BUN 8 - 27 mg/dL 7(L) 12 13  Creatinine 0.76 - 1.27 mg/dL 1.15 1.14 1.38(H)  BUN/Creat Ratio 10 - 24 6(L) - -  Sodium 134 - 144 mmol/L 143 142 136  Potassium 3.5 - 5.2 mmol/L 3.8 3.2(L) 3.4(L)  Chloride 96 - 106 mmol/L 98 102 100(L)  CO2 20 - 29  mmol/L '29 31 27  ' Calcium 8.6 - 10.2 mg/dL 9.2 8.8 8.8(L)   Hepatic Function Latest Ref Rng & Units 09/10/2017 05/24/2016 05/17/2016  Total Protein 6.0 - 8.5 g/dL 7.5 - 8.0  Albumin 3.5 - 4.8 g/dL 4.4 3.4(L) 4.5  AST 0 - 40 IU/L 25 - 27  ALT 0 - 44 IU/L 16 - 22  Alk Phosphatase 39 - 117 IU/L 46 - 40  Total Bilirubin 0.0 - 1.2 mg/dL 0.6 - 0.9   CBC Latest Ref Rng & Units 09/10/2017 05/25/2016 05/23/2016  WBC 3.4 - 10.8 x10E3/uL 5.7 10.1 8.3  Hemoglobin 13.0 - 17.7 g/dL 14.1 10.7(L) 10.5(L)    Hematocrit 37.5 - 51.0 % 42.3 31.1(L) 30.9(L)  Platelets 150 - 379 x10E3/uL 141(L) 250 195   Lab Results  Component Value Date   MCV 81 09/10/2017   MCV 76.6 (L) 05/25/2016   MCV 76.5 (L) 05/23/2016   Lab Results  Component Value Date   TSH 2.980 09/10/2017    Lipid Panel     Component Value Date/Time   CHOL 136 09/10/2017 1056   TRIG 91 09/10/2017 1056   HDL 63 09/10/2017 1056   CHOLHDL 2.2 09/10/2017 1056   CHOLHDL 2.6 10/29/2015 0836   VLDL 27 10/29/2015 0836   LDLCALC 55 09/10/2017 1056     RADIOLOGY: No results found.  IMPRESSION:  1. Essential hypertension   2. Hyperlipidemia LDL goal <70   3. Type 2 diabetes mellitus without complication, with long-term current use of insulin ([place])   4. Sleep apnea, obstructive   5. Medication management   6. Stage 3 chronic kidney disease ([place])   7. Bilateral edema of lower extremity   8. Morbid obesity with BMI of 45.0-49.9, adult [facility])     ASSESSMENT AND PLAN: Bradley Alvarez is a 76 year old African-American gentleman who has a history of significant morbid obesity and has cardiovascular comorbidities including hypertension with grade 1 diastolic dysfunction, type 2 diabetes mellitus, mixed hyperlipidemia, and severe sleep apnea with CPAP therapy.  Asked year he had significantly changed his diet, was longer eats after 8 PM and lostlost over 40 pounds.  Unfortunately, his weight has increased and compared to September 2017 when he was 308 pounds, his weight today is 332 pounds.  He had undergone a colonoscopy this summer following his extensive bowel prep while continuing to take his diuretic regimen.  He developed acute kidney injury leading to discontinuance of his diuretic.  While he was hospitalized in June 2017 a follow-up echo Doppler study showed an EF of 60-65% with grade 1 diastolic dysfunction.  When I last saw him, his blood pressure was elevated, and losartan was increased.  His blood pressure today continues to be  elevated despite taking carvedilol 25 mmol twice a day, clonidine 0.1 mg twice a day, felodipine 10 mg, furosemide 20 mg as needed, and losartan 100 mg.  Rem[place]ely had been on spironolactone, but this was discontinued by his primary physician.  I am recommending he increase clonidine and take 0.2 mg twice a day and have suggested that he at least take the Lasix 2 days per week and possibly more depending upon his leg edema.  Support stockings were recommended.  He has not had laboratory in 6 months and I will check fasting laboratory today and adjustments to his medical regimen will be made if necessary.  He will be undergoing repeat blood work by his primary physician in 2019  He has regained significant amount of weight, which undoubtedly may  be playing a role in his blood pressure.  He continues to use CPAP with 100% compliance of his AHI is excellent at 0.9/h.  He continues to be on metformin entry listed.  He for diabetes.  Time spent: 25 minutes  Troy Sine, MD, [facility]  09/12/2017 3:59 PM

## 2017-09-11 LAB — HEMOGLOBIN A1C
ESTIMATED AVERAGE GLUCOSE: 143 mg/dL
HEMOGLOBIN A1C: 6.6 % — AB (ref 4.8–5.6)

## 2017-09-11 LAB — COMPREHENSIVE METABOLIC PANEL
A/G RATIO: 1.4 (ref 1.2–2.2)
ALBUMIN: 4.4 g/dL (ref 3.5–4.8)
ALT: 16 IU/L (ref 0–44)
AST: 25 IU/L (ref 0–40)
Alkaline Phosphatase: 46 IU/L (ref 39–117)
BUN / CREAT RATIO: 6 — AB (ref 10–24)
BUN: 7 mg/dL — ABNORMAL LOW (ref 8–27)
Bilirubin Total: 0.6 mg/dL (ref 0.0–1.2)
CALCIUM: 9.2 mg/dL (ref 8.6–10.2)
CO2: 29 mmol/L (ref 20–29)
Chloride: 98 mmol/L (ref 96–106)
Creatinine, Ser: 1.15 mg/dL (ref 0.76–1.27)
GFR calc Af Amer: 71 mL/min/{1.73_m2} (ref >59)
GFR, EST NON AFRICAN AMERICAN: 61 mL/min/{1.73_m2} (ref >59)
GLOBULIN, TOTAL: 3.1 g/dL (ref 1.5–4.5)
Glucose: 118 mg/dL — ABNORMAL HIGH (ref 65–99)
Potassium: 3.8 mmol/L (ref 3.5–5.2)
SODIUM: 143 mmol/L (ref 134–144)
Total Protein: 7.5 g/dL (ref 6.0–8.5)

## 2017-09-11 LAB — CBC
HEMATOCRIT: 42.3 % (ref 37.5–51.0)
HEMOGLOBIN: 14.1 g/dL (ref 13.0–17.7)
MCH: 27.1 pg (ref 26.6–33.0)
MCHC: 33.3 g/dL (ref 31.5–35.7)
MCV: 81 fL (ref 79–97)
Platelets: 141 10*3/uL — ABNORMAL LOW (ref 150–379)
RBC: 5.2 x10E6/uL (ref 4.14–5.80)
RDW: 15.1 % (ref 12.3–15.4)
WBC: 5.7 10*3/uL (ref 3.4–10.8)

## 2017-09-11 LAB — LIPID PANEL
CHOL/HDL RATIO: 2.2 ratio (ref 0.0–5.0)
Cholesterol, Total: 136 mg/dL (ref 100–199)
HDL: 63 mg/dL (ref >39)
LDL CALC: 55 mg/dL (ref 0–99)
Triglycerides: 91 mg/dL (ref 0–149)
VLDL Cholesterol Cal: 18 mg/dL (ref 5–40)

## 2017-09-11 LAB — TSH: TSH: 2.98 u[IU]/mL (ref 0.450–4.500)

## 2017-09-15 ENCOUNTER — Telehealth: Payer: Self-pay | Admitting: Cardiovascular Disease

## 2017-09-15 NOTE — Telephone Encounter (Signed)
New Message ° ° pt verbalized that she is returning call for rn °

## 2017-09-15 NOTE — Telephone Encounter (Signed)
Returning your call from yesterday,concerning his lab results.

## 2017-09-15 NOTE — Telephone Encounter (Signed)
Notes recorded by Troy Sine, MD on 09/13/2017 at 5:37 PM EDT Labs stable

## 2017-09-15 NOTE — Telephone Encounter (Signed)
Patient's wife notified of results.  

## 2017-09-15 NOTE — Telephone Encounter (Signed)
Left message to call back  

## 2017-09-22 DIAGNOSIS — E1129 Type 2 diabetes mellitus with other diabetic kidney complication: Secondary | ICD-10-CM | POA: Diagnosis not present

## 2017-09-22 DIAGNOSIS — L989 Disorder of the skin and subcutaneous tissue, unspecified: Secondary | ICD-10-CM | POA: Diagnosis not present

## 2017-09-22 DIAGNOSIS — I130 Hypertensive heart and chronic kidney disease with heart failure and stage 1 through stage 4 chronic kidney disease, or unspecified chronic kidney disease: Secondary | ICD-10-CM | POA: Diagnosis not present

## 2017-09-22 DIAGNOSIS — Z6841 Body Mass Index (BMI) 40.0 and over, adult: Secondary | ICD-10-CM | POA: Diagnosis not present

## 2017-09-23 DIAGNOSIS — L820 Inflamed seborrheic keratosis: Secondary | ICD-10-CM | POA: Diagnosis not present

## 2017-09-23 DIAGNOSIS — L821 Other seborrheic keratosis: Secondary | ICD-10-CM | POA: Diagnosis not present

## 2017-09-23 DIAGNOSIS — I8311 Varicose veins of right lower extremity with inflammation: Secondary | ICD-10-CM | POA: Diagnosis not present

## 2017-09-23 DIAGNOSIS — I872 Venous insufficiency (chronic) (peripheral): Secondary | ICD-10-CM | POA: Diagnosis not present

## 2017-09-23 DIAGNOSIS — I8312 Varicose veins of left lower extremity with inflammation: Secondary | ICD-10-CM | POA: Diagnosis not present

## 2017-09-30 DIAGNOSIS — I10 Essential (primary) hypertension: Secondary | ICD-10-CM | POA: Diagnosis not present

## 2017-10-05 DIAGNOSIS — I10 Essential (primary) hypertension: Secondary | ICD-10-CM | POA: Diagnosis not present

## 2017-10-05 DIAGNOSIS — R0602 Shortness of breath: Secondary | ICD-10-CM | POA: Diagnosis not present

## 2017-10-07 DIAGNOSIS — I10 Essential (primary) hypertension: Secondary | ICD-10-CM | POA: Diagnosis not present

## 2017-10-07 DIAGNOSIS — I77810 Thoracic aortic ectasia: Secondary | ICD-10-CM | POA: Diagnosis not present

## 2017-10-07 DIAGNOSIS — Z136 Encounter for screening for cardiovascular disorders: Secondary | ICD-10-CM | POA: Diagnosis not present

## 2017-11-04 DIAGNOSIS — I77810 Thoracic aortic ectasia: Secondary | ICD-10-CM | POA: Diagnosis not present

## 2017-11-04 DIAGNOSIS — I10 Essential (primary) hypertension: Secondary | ICD-10-CM | POA: Diagnosis not present

## 2017-11-04 DIAGNOSIS — I5030 Unspecified diastolic (congestive) heart failure: Secondary | ICD-10-CM | POA: Diagnosis not present

## 2017-11-04 DIAGNOSIS — Z136 Encounter for screening for cardiovascular disorders: Secondary | ICD-10-CM | POA: Diagnosis not present

## 2017-12-04 ENCOUNTER — Encounter: Payer: Self-pay | Admitting: Podiatry

## 2017-12-04 ENCOUNTER — Ambulatory Visit: Payer: Medicare HMO | Admitting: Podiatry

## 2017-12-04 DIAGNOSIS — M79675 Pain in left toe(s): Secondary | ICD-10-CM

## 2017-12-04 DIAGNOSIS — M2010 Hallux valgus (acquired), unspecified foot: Secondary | ICD-10-CM

## 2017-12-04 DIAGNOSIS — M2140 Flat foot [pes planus] (acquired), unspecified foot: Secondary | ICD-10-CM

## 2017-12-04 DIAGNOSIS — B351 Tinea unguium: Secondary | ICD-10-CM | POA: Diagnosis not present

## 2017-12-04 DIAGNOSIS — E119 Type 2 diabetes mellitus without complications: Secondary | ICD-10-CM

## 2017-12-04 DIAGNOSIS — M79674 Pain in right toe(s): Secondary | ICD-10-CM | POA: Diagnosis not present

## 2017-12-04 NOTE — Progress Notes (Signed)
Patient ID: Bradley Alvarez, male   DOB: 1941-10-20, 78 y.o.   MRN: [id number] Complaint:  Visit Type: Patient returns to my office for continued preventative foot care services. Complaint: Patient states" my nails have grown long and thick and become painful to walk and wear shoes" Patient has been diagnosed with DM with no complications. He presents for preventative foot care services. Eye surgery eyes B/L.  Podiatric Exam: Vascular: dorsalis pedis and posterior tibial pulses are palpable bilateral. Capillary return is immediate. Temperature gradient is WNL. Skin turgor WNL  Sensorium: Normal Semmes Weinstein monofilament test. Normal tactile sensation bilaterally. Nail Exam: Pt has thick disfigured discolored nails with subungual debris noted bilateral entire nail hallux through fifth toenails Ulcer Exam: There is no evidence of ulcer or pre-ulcerative changes or infection. Orthopedic Exam: Muscle tone and strength are WNL. No limitations in general ROM. No crepitus or effusions noted. PTT tendinitis/arthritis left foot.  HAV  B/L.  Pes planus B/L Skin: No Porokeratosis. No infection or ulcers  Diagnosis:  Tinea unguium, Pain in right toe, pain in left toes,  HAV  B/L  PTT tendinitis  Pes planus  Treatment & Plan Procedures and Treatment: Consent by patient was obtained for treatment procedures. The patient understood the discussion of treatment and procedures well. All questions were answered thoroughly reviewed. Debridement of mycotic and hypertrophic toenails, 1 through 5 bilateral and clearing of subungual debris. No ulceration, no infection noted.  Return Visit-Office Procedure: Patient instructed to return to the office for a follow up visit 3 months for continued evaluation and treatment.   Gardiner Barefoot DPM

## 2017-12-10 DIAGNOSIS — Z961 Presence of intraocular lens: Secondary | ICD-10-CM | POA: Diagnosis not present

## 2017-12-10 DIAGNOSIS — H35371 Puckering of macula, right eye: Secondary | ICD-10-CM | POA: Diagnosis not present

## 2017-12-10 DIAGNOSIS — E119 Type 2 diabetes mellitus without complications: Secondary | ICD-10-CM | POA: Diagnosis not present

## 2017-12-10 DIAGNOSIS — H40013 Open angle with borderline findings, low risk, bilateral: Secondary | ICD-10-CM | POA: Diagnosis not present

## 2018-01-12 DIAGNOSIS — I5042 Chronic combined systolic (congestive) and diastolic (congestive) heart failure: Secondary | ICD-10-CM | POA: Diagnosis not present

## 2018-01-12 DIAGNOSIS — I714 Abdominal aortic aneurysm, without rupture: Secondary | ICD-10-CM | POA: Diagnosis not present

## 2018-01-12 DIAGNOSIS — E1129 Type 2 diabetes mellitus with other diabetic kidney complication: Secondary | ICD-10-CM | POA: Diagnosis not present

## 2018-01-12 DIAGNOSIS — G4733 Obstructive sleep apnea (adult) (pediatric): Secondary | ICD-10-CM | POA: Diagnosis not present

## 2018-01-12 DIAGNOSIS — I130 Hypertensive heart and chronic kidney disease with heart failure and stage 1 through stage 4 chronic kidney disease, or unspecified chronic kidney disease: Secondary | ICD-10-CM | POA: Diagnosis not present

## 2018-01-12 DIAGNOSIS — N183 Chronic kidney disease, stage 3 (moderate): Secondary | ICD-10-CM | POA: Diagnosis not present

## 2018-01-12 DIAGNOSIS — Z6841 Body Mass Index (BMI) 40.0 and over, adult: Secondary | ICD-10-CM | POA: Diagnosis not present

## 2018-02-16 DIAGNOSIS — H1132 Conjunctival hemorrhage, left eye: Secondary | ICD-10-CM | POA: Diagnosis not present

## 2018-03-05 ENCOUNTER — Ambulatory Visit: Payer: Medicare HMO | Admitting: Podiatry

## 2018-03-05 ENCOUNTER — Encounter: Payer: Self-pay | Admitting: Podiatry

## 2018-03-05 DIAGNOSIS — B351 Tinea unguium: Secondary | ICD-10-CM | POA: Diagnosis not present

## 2018-03-05 DIAGNOSIS — M79674 Pain in right toe(s): Secondary | ICD-10-CM | POA: Diagnosis not present

## 2018-03-05 DIAGNOSIS — M79675 Pain in left toe(s): Secondary | ICD-10-CM | POA: Diagnosis not present

## 2018-03-05 DIAGNOSIS — M2010 Hallux valgus (acquired), unspecified foot: Secondary | ICD-10-CM

## 2018-03-05 DIAGNOSIS — E119 Type 2 diabetes mellitus without complications: Secondary | ICD-10-CM

## 2018-03-05 NOTE — Progress Notes (Signed)
Patient ID: Bradley Alvarez, male   DOB: 03-29-1941, 77 y.o.   MRN: [id number] Complaint:  Visit Type: Patient returns to my office for continued preventative foot care services. Complaint: Patient states" my nails have grown long and thick and become painful to walk and wear shoes" Patient has been diagnosed with DM with no complications. He presents for preventative foot care services. Eye surgery eyes B/L.  Podiatric Exam: Vascular: dorsalis pedis and posterior tibial pulses are palpable bilateral. Capillary return is immediate. Temperature gradient is WNL. Skin turgor WNL  Sensorium: Normal Semmes Weinstein monofilament test. Normal tactile sensation bilaterally. Nail Exam: Pt has thick disfigured discolored nails with subungual debris noted bilateral entire nail hallux through fifth toenails Ulcer Exam: There is no evidence of ulcer or pre-ulcerative changes or infection. Orthopedic Exam: Muscle tone and strength are WNL. No limitations in general ROM. No crepitus or effusions noted. PTT tendinitis/arthritis left foot.  HAV  B/L.  Pes planus B/L Skin: No Porokeratosis. No infection or ulcers  Diagnosis:  Tinea unguium, Pain in right toe, pain in left toes,  HAV  B/L  PTT tendinitis  Pes planus  Treatment & Plan Procedures and Treatment: Consent by patient was obtained for treatment procedures. The patient understood the discussion of treatment and procedures well. All questions were answered thoroughly reviewed. Debridement of mycotic and hypertrophic toenails, 1 through 5 bilateral and clearing of subungual debris. No ulceration, no infection noted.  Return Visit-Office Procedure: Patient instructed to return to the office for a follow up visit 3 months for continued evaluation and treatment.   Bradley Alvarez DPM

## 2018-04-08 DIAGNOSIS — R0609 Other forms of dyspnea: Secondary | ICD-10-CM | POA: Diagnosis not present

## 2018-04-08 DIAGNOSIS — I5042 Chronic combined systolic (congestive) and diastolic (congestive) heart failure: Secondary | ICD-10-CM | POA: Diagnosis not present

## 2018-04-08 DIAGNOSIS — R062 Wheezing: Secondary | ICD-10-CM | POA: Diagnosis not present

## 2018-04-08 DIAGNOSIS — I10 Essential (primary) hypertension: Secondary | ICD-10-CM | POA: Diagnosis not present

## 2018-04-08 DIAGNOSIS — J310 Chronic rhinitis: Secondary | ICD-10-CM | POA: Diagnosis not present

## 2018-04-08 DIAGNOSIS — E1129 Type 2 diabetes mellitus with other diabetic kidney complication: Secondary | ICD-10-CM | POA: Diagnosis not present

## 2018-04-08 DIAGNOSIS — Z6841 Body Mass Index (BMI) 40.0 and over, adult: Secondary | ICD-10-CM | POA: Diagnosis not present

## 2018-04-13 DIAGNOSIS — E7849 Other hyperlipidemia: Secondary | ICD-10-CM | POA: Diagnosis not present

## 2018-04-13 DIAGNOSIS — Z125 Encounter for screening for malignant neoplasm of prostate: Secondary | ICD-10-CM | POA: Diagnosis not present

## 2018-04-13 DIAGNOSIS — E1129 Type 2 diabetes mellitus with other diabetic kidney complication: Secondary | ICD-10-CM | POA: Diagnosis not present

## 2018-04-13 DIAGNOSIS — R82998 Other abnormal findings in urine: Secondary | ICD-10-CM | POA: Diagnosis not present

## 2018-04-13 DIAGNOSIS — I10 Essential (primary) hypertension: Secondary | ICD-10-CM | POA: Diagnosis not present

## 2018-04-20 DIAGNOSIS — R69 Illness, unspecified: Secondary | ICD-10-CM | POA: Diagnosis not present

## 2018-04-20 DIAGNOSIS — I5030 Unspecified diastolic (congestive) heart failure: Secondary | ICD-10-CM | POA: Diagnosis not present

## 2018-04-20 DIAGNOSIS — N183 Chronic kidney disease, stage 3 (moderate): Secondary | ICD-10-CM | POA: Diagnosis not present

## 2018-04-20 DIAGNOSIS — I428 Other cardiomyopathies: Secondary | ICD-10-CM | POA: Diagnosis not present

## 2018-04-20 DIAGNOSIS — I714 Abdominal aortic aneurysm, without rupture: Secondary | ICD-10-CM | POA: Diagnosis not present

## 2018-04-20 DIAGNOSIS — E7849 Other hyperlipidemia: Secondary | ICD-10-CM | POA: Diagnosis not present

## 2018-04-20 DIAGNOSIS — Z0000 Encounter for general adult medical examination without abnormal findings: Secondary | ICD-10-CM | POA: Diagnosis not present

## 2018-04-20 DIAGNOSIS — I130 Hypertensive heart and chronic kidney disease with heart failure and stage 1 through stage 4 chronic kidney disease, or unspecified chronic kidney disease: Secondary | ICD-10-CM | POA: Diagnosis not present

## 2018-04-20 DIAGNOSIS — H35039 Hypertensive retinopathy, unspecified eye: Secondary | ICD-10-CM | POA: Diagnosis not present

## 2018-04-20 DIAGNOSIS — I5042 Chronic combined systolic (congestive) and diastolic (congestive) heart failure: Secondary | ICD-10-CM | POA: Diagnosis not present

## 2018-04-20 DIAGNOSIS — E1129 Type 2 diabetes mellitus with other diabetic kidney complication: Secondary | ICD-10-CM | POA: Diagnosis not present

## 2018-04-23 DIAGNOSIS — Z1212 Encounter for screening for malignant neoplasm of rectum: Secondary | ICD-10-CM | POA: Diagnosis not present

## 2018-05-07 DIAGNOSIS — I10 Essential (primary) hypertension: Secondary | ICD-10-CM | POA: Diagnosis not present

## 2018-05-07 DIAGNOSIS — I77810 Thoracic aortic ectasia: Secondary | ICD-10-CM | POA: Diagnosis not present

## 2018-05-07 DIAGNOSIS — I5030 Unspecified diastolic (congestive) heart failure: Secondary | ICD-10-CM | POA: Diagnosis not present

## 2018-06-04 DIAGNOSIS — R69 Illness, unspecified: Secondary | ICD-10-CM | POA: Diagnosis not present

## 2018-06-08 DIAGNOSIS — H40013 Open angle with borderline findings, low risk, bilateral: Secondary | ICD-10-CM | POA: Diagnosis not present

## 2018-06-11 ENCOUNTER — Ambulatory Visit: Payer: Medicare HMO | Admitting: Podiatry

## 2018-06-18 ENCOUNTER — Ambulatory Visit: Payer: Medicare HMO | Admitting: Podiatry

## 2018-06-18 ENCOUNTER — Encounter: Payer: Self-pay | Admitting: Podiatry

## 2018-06-18 DIAGNOSIS — B351 Tinea unguium: Secondary | ICD-10-CM | POA: Diagnosis not present

## 2018-06-18 DIAGNOSIS — E119 Type 2 diabetes mellitus without complications: Secondary | ICD-10-CM

## 2018-06-18 DIAGNOSIS — M79675 Pain in left toe(s): Secondary | ICD-10-CM

## 2018-06-18 DIAGNOSIS — M2140 Flat foot [pes planus] (acquired), unspecified foot: Secondary | ICD-10-CM

## 2018-06-18 DIAGNOSIS — M79674 Pain in right toe(s): Secondary | ICD-10-CM

## 2018-06-18 NOTE — Progress Notes (Signed)
Patient ID: Bradley Alvarez, male   DOB: 04/14/1941, 77 y.o.   MRN: [id number] Complaint:  Visit Type: Patient returns to my office for continued preventative foot care services. Complaint: Patient states" my nails have grown long and thick and become painful to walk and wear shoes" Patient has been diagnosed with DM with no complications. He presents for preventative foot care services. Eye surgery eyes B/L.  Podiatric Exam: Vascular: dorsalis pedis and posterior tibial pulses are palpable bilateral. Capillary return is immediate. Temperature gradient is WNL. Skin turgor WNL  Sensorium: Normal Semmes Weinstein monofilament test. Normal tactile sensation bilaterally. Nail Exam: Pt has thick disfigured discolored nails with subungual debris noted bilateral entire nail hallux through fifth toenails Ulcer Exam: There is no evidence of ulcer or pre-ulcerative changes or infection. Orthopedic Exam: Muscle tone and strength are WNL. No limitations in general ROM. No crepitus or effusions noted. PTT tendinitis/arthritis left foot.  HAV  B/L.  Pes planus B/L Skin: No Porokeratosis. No infection or ulcers  Diagnosis:  Tinea unguium, Pain in right toe, pain in left toes,  HAV  B/L  PTT tendinitis  Pes planus  Treatment & Plan Procedures and Treatment: Consent by patient was obtained for treatment procedures. The patient understood the discussion of treatment and procedures well. All questions were answered thoroughly reviewed. Debridement of mycotic and hypertrophic toenails, 1 through 5 bilateral and clearing of subungual debris. No ulceration, no infection noted.  Return Visit-Office Procedure: Patient instructed to return to the office for a follow up visit 3 months for continued evaluation and treatment.   Deolinda Frid DPM 

## 2018-07-21 DIAGNOSIS — R69 Illness, unspecified: Secondary | ICD-10-CM | POA: Diagnosis not present

## 2018-08-24 DIAGNOSIS — Z23 Encounter for immunization: Secondary | ICD-10-CM | POA: Diagnosis not present

## 2018-08-27 DIAGNOSIS — G4733 Obstructive sleep apnea (adult) (pediatric): Secondary | ICD-10-CM | POA: Diagnosis not present

## 2018-08-27 DIAGNOSIS — N5089 Other specified disorders of the male genital organs: Secondary | ICD-10-CM | POA: Diagnosis not present

## 2018-08-27 DIAGNOSIS — Z1389 Encounter for screening for other disorder: Secondary | ICD-10-CM | POA: Diagnosis not present

## 2018-08-27 DIAGNOSIS — I130 Hypertensive heart and chronic kidney disease with heart failure and stage 1 through stage 4 chronic kidney disease, or unspecified chronic kidney disease: Secondary | ICD-10-CM | POA: Diagnosis not present

## 2018-08-27 DIAGNOSIS — Z6841 Body Mass Index (BMI) 40.0 and over, adult: Secondary | ICD-10-CM | POA: Diagnosis not present

## 2018-08-27 DIAGNOSIS — N183 Chronic kidney disease, stage 3 (moderate): Secondary | ICD-10-CM | POA: Diagnosis not present

## 2018-08-27 DIAGNOSIS — E1129 Type 2 diabetes mellitus with other diabetic kidney complication: Secondary | ICD-10-CM | POA: Diagnosis not present

## 2018-08-27 DIAGNOSIS — I5042 Chronic combined systolic (congestive) and diastolic (congestive) heart failure: Secondary | ICD-10-CM | POA: Diagnosis not present

## 2018-08-27 DIAGNOSIS — I714 Abdominal aortic aneurysm, without rupture: Secondary | ICD-10-CM | POA: Diagnosis not present

## 2018-08-30 ENCOUNTER — Other Ambulatory Visit: Payer: Self-pay | Admitting: Internal Medicine

## 2018-08-30 DIAGNOSIS — N5089 Other specified disorders of the male genital organs: Secondary | ICD-10-CM

## 2018-08-31 ENCOUNTER — Other Ambulatory Visit: Payer: Self-pay | Admitting: Internal Medicine

## 2018-08-31 DIAGNOSIS — N5089 Other specified disorders of the male genital organs: Secondary | ICD-10-CM

## 2018-08-31 DIAGNOSIS — I77810 Thoracic aortic ectasia: Secondary | ICD-10-CM | POA: Diagnosis not present

## 2018-08-31 DIAGNOSIS — I10 Essential (primary) hypertension: Secondary | ICD-10-CM | POA: Diagnosis not present

## 2018-08-31 DIAGNOSIS — I5030 Unspecified diastolic (congestive) heart failure: Secondary | ICD-10-CM | POA: Diagnosis not present

## 2018-09-01 ENCOUNTER — Other Ambulatory Visit: Payer: Self-pay | Admitting: Internal Medicine

## 2018-09-01 DIAGNOSIS — N5089 Other specified disorders of the male genital organs: Secondary | ICD-10-CM

## 2018-09-01 DIAGNOSIS — N183 Chronic kidney disease, stage 3 (moderate): Secondary | ICD-10-CM | POA: Diagnosis not present

## 2018-09-01 DIAGNOSIS — I10 Essential (primary) hypertension: Secondary | ICD-10-CM | POA: Diagnosis not present

## 2018-09-03 ENCOUNTER — Ambulatory Visit
Admission: RE | Admit: 2018-09-03 | Discharge: 2018-09-03 | Disposition: A | Discharge status: Home / Self Care | Payer: Medicare HMO | Source: Ambulatory Visit | Attending: Internal Medicine | Admitting: Internal Medicine

## 2018-09-03 ENCOUNTER — Other Ambulatory Visit: Payer: Medicare HMO

## 2018-09-03 DIAGNOSIS — N5089 Other specified disorders of the male genital organs: Secondary | ICD-10-CM

## 2018-09-03 DIAGNOSIS — N433 Hydrocele, unspecified: Secondary | ICD-10-CM | POA: Diagnosis not present

## 2018-09-08 ENCOUNTER — Other Ambulatory Visit: Payer: Self-pay | Admitting: Internal Medicine

## 2018-09-08 DIAGNOSIS — I714 Abdominal aortic aneurysm, without rupture: Secondary | ICD-10-CM | POA: Diagnosis not present

## 2018-09-08 DIAGNOSIS — I130 Hypertensive heart and chronic kidney disease with heart failure and stage 1 through stage 4 chronic kidney disease, or unspecified chronic kidney disease: Secondary | ICD-10-CM | POA: Diagnosis not present

## 2018-09-08 DIAGNOSIS — N5089 Other specified disorders of the male genital organs: Secondary | ICD-10-CM | POA: Diagnosis not present

## 2018-09-08 DIAGNOSIS — N5082 Scrotal pain: Secondary | ICD-10-CM

## 2018-09-08 DIAGNOSIS — E1129 Type 2 diabetes mellitus with other diabetic kidney complication: Secondary | ICD-10-CM | POA: Diagnosis not present

## 2018-09-08 DIAGNOSIS — N183 Chronic kidney disease, stage 3 (moderate): Secondary | ICD-10-CM | POA: Diagnosis not present

## 2018-09-08 DIAGNOSIS — R109 Unspecified abdominal pain: Secondary | ICD-10-CM

## 2018-09-08 DIAGNOSIS — I5042 Chronic combined systolic (congestive) and diastolic (congestive) heart failure: Secondary | ICD-10-CM | POA: Diagnosis not present

## 2018-09-08 DIAGNOSIS — Z6841 Body Mass Index (BMI) 40.0 and over, adult: Secondary | ICD-10-CM | POA: Diagnosis not present

## 2018-09-10 ENCOUNTER — Ambulatory Visit
Admission: RE | Admit: 2018-09-10 | Discharge: 2018-09-10 | Disposition: A | Discharge status: Home / Self Care | Payer: Medicare HMO | Source: Ambulatory Visit | Attending: Internal Medicine | Admitting: Internal Medicine

## 2018-09-10 DIAGNOSIS — R109 Unspecified abdominal pain: Secondary | ICD-10-CM

## 2018-09-10 DIAGNOSIS — N5082 Scrotal pain: Secondary | ICD-10-CM

## 2018-09-10 DIAGNOSIS — K8020 Calculus of gallbladder without cholecystitis without obstruction: Secondary | ICD-10-CM | POA: Diagnosis not present

## 2018-09-13 DIAGNOSIS — R69 Illness, unspecified: Secondary | ICD-10-CM | POA: Diagnosis not present

## 2018-09-24 ENCOUNTER — Ambulatory Visit: Payer: Medicare HMO | Admitting: Podiatry

## 2018-09-24 ENCOUNTER — Encounter: Payer: Self-pay | Admitting: Podiatry

## 2018-09-24 DIAGNOSIS — B351 Tinea unguium: Secondary | ICD-10-CM | POA: Diagnosis not present

## 2018-09-24 DIAGNOSIS — M79674 Pain in right toe(s): Secondary | ICD-10-CM

## 2018-09-24 DIAGNOSIS — M2140 Flat foot [pes planus] (acquired), unspecified foot: Secondary | ICD-10-CM | POA: Diagnosis not present

## 2018-09-24 DIAGNOSIS — M79675 Pain in left toe(s): Secondary | ICD-10-CM | POA: Diagnosis not present

## 2018-09-24 DIAGNOSIS — E119 Type 2 diabetes mellitus without complications: Secondary | ICD-10-CM

## 2018-09-24 NOTE — Progress Notes (Signed)
Patient ID: Bradley Alvarez, male   DOB: 01/20/1941, 77 y.o.   MRN: [id number] Complaint:  Visit Type: Patient returns to my office for continued preventative foot care services. Complaint: Patient states" my nails have grown long and thick and become painful to walk and wear shoes" Patient has been diagnosed with DM with no complications. He presents for preventative foot care services. Eye surgery eyes B/L.  Podiatric Exam: Vascular: dorsalis pedis and posterior tibial pulses are palpable bilateral. Capillary return is immediate. Temperature gradient is WNL. Skin turgor WNL  Sensorium: Normal Semmes Weinstein monofilament test. Normal tactile sensation bilaterally. Nail Exam: Pt has thick disfigured discolored nails with subungual debris noted bilateral entire nail hallux through fifth toenails Ulcer Exam: There is no evidence of ulcer or pre-ulcerative changes or infection. Orthopedic Exam: Muscle tone and strength are WNL. No limitations in general ROM. No crepitus or effusions noted. PTT tendinitis/arthritis left foot.  HAV  B/L.  Pes planus B/L Skin: No Porokeratosis. No infection or ulcers  Diagnosis:  Tinea unguium, Pain in right toe, pain in left toes,  HAV  B/L  PTT tendinitis  Pes planus  Treatment & Plan Procedures and Treatment: Consent by patient was obtained for treatment procedures. The patient understood the discussion of treatment and procedures well. All questions were answered thoroughly reviewed. Debridement of mycotic and hypertrophic toenails, 1 through 5 bilateral and clearing of subungual debris. No ulceration, no infection noted.  Return Visit-Office Procedure: Patient instructed to return to the office for a follow up visit 3 months for continued evaluation and treatment.   Dwayna Kentner DPM 

## 2018-10-04 DIAGNOSIS — I10 Essential (primary) hypertension: Secondary | ICD-10-CM | POA: Diagnosis not present

## 2018-10-04 DIAGNOSIS — I77810 Thoracic aortic ectasia: Secondary | ICD-10-CM | POA: Diagnosis not present

## 2018-10-04 DIAGNOSIS — I5030 Unspecified diastolic (congestive) heart failure: Secondary | ICD-10-CM | POA: Diagnosis not present

## 2018-11-02 DIAGNOSIS — N433 Hydrocele, unspecified: Secondary | ICD-10-CM | POA: Diagnosis not present

## 2018-11-03 DIAGNOSIS — R69 Illness, unspecified: Secondary | ICD-10-CM | POA: Diagnosis not present

## 2018-11-04 DIAGNOSIS — I77810 Thoracic aortic ectasia: Secondary | ICD-10-CM | POA: Diagnosis not present

## 2018-11-04 DIAGNOSIS — I5030 Unspecified diastolic (congestive) heart failure: Secondary | ICD-10-CM | POA: Diagnosis not present

## 2018-11-04 DIAGNOSIS — I5042 Chronic combined systolic (congestive) and diastolic (congestive) heart failure: Secondary | ICD-10-CM | POA: Diagnosis not present

## 2018-11-05 DIAGNOSIS — I10 Essential (primary) hypertension: Secondary | ICD-10-CM | POA: Diagnosis not present

## 2018-11-05 DIAGNOSIS — I5030 Unspecified diastolic (congestive) heart failure: Secondary | ICD-10-CM | POA: Diagnosis not present

## 2018-11-05 DIAGNOSIS — I77810 Thoracic aortic ectasia: Secondary | ICD-10-CM | POA: Diagnosis not present

## 2018-11-09 ENCOUNTER — Other Ambulatory Visit: Payer: Self-pay | Admitting: Urology

## 2018-11-10 ENCOUNTER — Other Ambulatory Visit: Payer: Self-pay

## 2018-11-10 ENCOUNTER — Encounter (HOSPITAL_BASED_OUTPATIENT_CLINIC_OR_DEPARTMENT_OTHER): Payer: Self-pay | Admitting: *Deleted

## 2018-11-10 NOTE — Progress Notes (Addendum)
Spoke w/ pt wife, Bradley Alvarez, via phone for pre-op interview.  Pt HOH and wife knows medication (need wife in pre-op).  Arrive at [facility].  Will take claritin, coreg, plendil, and bidil am dos w/ sips of water.   Needs istat 8.   Called and Requested lov note, ekg, and echo to be faxed , from dr Heloise Beecham (cardiologist).  Asked to bring cpap dos.  ADDENDUM:  Received via fax current ekg, echo, and cardiology note and place in chart.

## 2018-11-14 NOTE — H&P (Signed)
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 His hydrocele is on both sides. The patient has undergone a scrotal ultrasound. He does not have pain on the side of his hydrocele. His hydrocele does cause restriction of normal activities.   He has not had injuries to the testicles or scrotum. He has not had scrotal surgery. He has not had a testicular infection. His hydrocele does bother him enough to consider surgical repair. This condition would be considered of mild to moderate severity with no modifying factors or associated signs or symptoms other than as noted above.   11/02/18: He is seen today for further evaluation of bilateral hydroceles. He reports that for about 3 years he has had scrotal swelling but it really became a problem when he tried to board a in airplane and was evaluated by security for the mass he had between his legs. It has increased in size over the past 3 years. There is no pain. He said when he sits on the toilet his scrotum does hang in water.     ALLERGIES: No Allergies    MEDICATIONS: Aspir-81 81 MG Oral Tablet Delayed Release Oral  Carvedilol 12.5 MG Oral Tablet Oral  Claritin  Clobetasol Emollient 0.05 % cream  Felodipine ER 5 MG Oral Tablet Extended Release 24 Hour Oral  Fluticasone Propionate 0.005 % ointment  Furosemide 40 mg tablet  Hemp Oil 1 PO Daily PRN  Hydralazine Hcl  Hydrocodone-Acetaminophen 7.5-325 MG Oral Tablet Oral  Ketoconazole  Krill Oil 1000 MG Oral Capsule Oral  Losartan Potassium 100 mg tablet  MetFORMIN HCl - 1000 MG Oral Tablet Oral  Multivitamins TABS Oral  Potassium  Spironolactone 50 mg tablet  Trulicity 1.5 OI/7.1 ml pen injector  Urea  Vitamin B-12 TABS Oral  Volteran Gel  Zocor 40 MG Oral Tablet Oral     GU PSH: Radical Prostatectomy      PSH Notes: Knee Surgery, Bladder Surgery, Prostate Surgery   NON-GU PSH: Knee  replacement, Bilateral    GU PMH: Disorder of Penis Ot, Penile pain - 2014      PMH Notes: BPH: He underwent a simple prostatectomy by Dr. Diona Fanti in the past.     NON-GU PMH: Personal history of neoplasm of uncertain behavior, History of neoplasm of uncertain behavior of bladder - 2014 Personal history of other diseases of the nervous system and sense organs, History of sleep apnea - 2014 Personal history of other mental and behavioral disorders, History of depression - 2014 Type 2 diabetes mellitus with hyperglycemia, Diabetes Mellitus Poorly Controlled - 2014    FAMILY HISTORY: Diabetes - Brother, Brother, Mother, Brother, Sister, Brother, Sister Esophageal Cancer - Brother Heart Disease - Father, Mother renal failure - Brother Renal Transplant RLQ - Brother   SOCIAL HISTORY: Marital Status: Married Current Smoking Status: Patient does not smoke anymore.   Tobacco Use Assessment Completed: Used Tobacco in last 30 days? Social Drinker.  Drinks 1 caffeinated drink per day.     Notes: Previous History Of Smoking, Alcohol Use, Caffeine Use, Marital History - Currently Married   REVIEW OF SYSTEMS:    GU Review Male:   Patient reports frequent urination, get up at night to urinate, stream starts and stops, and have to strain to urinate . Patient denies hard to postpone urination, burning/ pain with urination, leakage of urine, trouble starting your stream, erection problems, and penile pain.  Gastrointestinal (  Upper):   Patient denies nausea, vomiting, and indigestion/ heartburn.  Gastrointestinal (Lower):   Patient denies diarrhea and constipation.  Constitutional:   Patient denies fever, night sweats, weight loss, and fatigue.  Skin:   Patient denies skin rash/ lesion and itching.  Eyes:   Patient denies blurred vision and double vision.  Ears/ Nose/ Throat:   Patient denies sore throat and sinus problems.  Hematologic/Lymphatic:   Patient denies swollen glands and easy  bruising.  Cardiovascular:   Patient denies leg swelling and chest pains.  Respiratory:   Patient denies cough and shortness of breath.  Endocrine:   Patient denies excessive thirst.  Musculoskeletal:   Patient denies back pain and joint pain.  Neurological:   Patient denies headaches and dizziness.  Psychologic:   Patient denies depression and anxiety.   VITAL SIGNS:    Weight 326 lb / 147.87 kg  Height 74 in / 187.96 cm  BP 171/84 mmHg  Pulse 60 /min  BMI 41.9 kg/m   GU PHYSICAL EXAMINATION:    Scrotum: No lesions. No edema. No cysts. No warts.  Epididymides: His right left epididymis are not palpable due to his surrounding hydroceles.  Testes: He has massive bilateral hydroceles right> left. His testicles are not palpable  Urethral Meatus: Normal size. No lesion, no wart, no discharge, no polyp. Normal location.  Penis: Circumcised, no warts, no cracks. No dorsal Peyronie's plaques, no left corporal Peyronie's plaques, no right corporal Peyronie's plaques, no scarring, no warts. No balanitis, no meatal stenosis.   MULTI-SYSTEM PHYSICAL EXAMINATION:    Constitutional: Well-nourished. No physical deformities. Normally developed. Good grooming.  Neck: Neck symmetrical, not swollen. Normal tracheal position.  Respiratory: No labored breathing, no use of accessory muscles.   Cardiovascular: Normal temperature, normal extremity pulses, no swelling, no varicosities.  Lymphatic: No enlargement of neck, axillae, groin.  Skin: No paleness, no jaundice, no cyanosis. No lesion, no ulcer, no rash.  Neurologic / Psychiatric: Oriented to time, oriented to place, oriented to person. No depression, no anxiety, no agitation.  Gastrointestinal: No mass, no tenderness, no rigidity, non obese abdomen.  Eyes: Normal conjunctivae. Normal eyelids.  Ears, Nose, Mouth, and Throat: Left ear no scars, no lesions, no masses. Right ear no scars, no lesions, no masses. Nose no scars, no lesions, no masses.  Normal hearing. Normal lips.  Musculoskeletal: Normal gait and station of head and neck.     PAST DATA REVIEWED:  Source Of History:  Patient  Lab Test Review:   BUN/Creatinine  Records Review:   Previous Doctor Records, Previous Hospital Records  X-Ray Review: Renal Ultrasound: Reviewed Report. A renal ultrasound in 11/19 revealed no hydronephrosis or stones. An 18 mm exophytic lesion was identified and had been seen on previous ultrasounds over several years. It appeared unchanged in size. Scrotal Ultrasound: Reviewed Films. Reviewed Report. Discussed With Patient. Scrotal ultrasound in 11/19 revealed large bilateral hydroceles   Notes:                     A creatinine in 10/18 was 1.15.  He had a PSA in 5/19 was 0.21.   PROCEDURES: None   ASSESSMENT/PLAN:      ICD-10 Details  1 GU:   Hydrocele, Unspec - N43.3 Bilateral, He will be scheduled for a bilateral hydrocelectomy as an outpatient with the use of a drain postoperatively.          Notes:   He has massive bilateral hydroceles with the right side much  larger than the left. We therefore discussed surgical management. I did discuss aspiration but feel this has a low probability of long-term success. I went over the incision used for hydrocelectomy, how the procedure would be performed, its risks and complications, the probability of success, the need for a drain postoperatively and the anticipated postoperative course. He understands and has elected to proceed.

## 2018-11-15 ENCOUNTER — Encounter (HOSPITAL_BASED_OUTPATIENT_CLINIC_OR_DEPARTMENT_OTHER): Payer: Self-pay

## 2018-11-15 ENCOUNTER — Ambulatory Visit (HOSPITAL_BASED_OUTPATIENT_CLINIC_OR_DEPARTMENT_OTHER): Payer: Medicare HMO | Admitting: Anesthesiology

## 2018-11-15 ENCOUNTER — Ambulatory Visit (HOSPITAL_BASED_OUTPATIENT_CLINIC_OR_DEPARTMENT_OTHER)
Admission: RE | Admit: 2018-11-15 | Discharge: 2018-11-15 | Disposition: A | Discharge status: Home / Self Care | Payer: Medicare HMO | Attending: Urology | Admitting: Urology

## 2018-11-15 ENCOUNTER — Encounter (HOSPITAL_BASED_OUTPATIENT_CLINIC_OR_DEPARTMENT_OTHER)
Admission: RE | Disposition: A | Discharge status: Home / Self Care | Payer: Self-pay | Source: Home / Self Care | Attending: Urology

## 2018-11-15 DIAGNOSIS — Z7982 Long term (current) use of aspirin: Secondary | ICD-10-CM | POA: Insufficient documentation

## 2018-11-15 DIAGNOSIS — Z96653 Presence of artificial knee joint, bilateral: Secondary | ICD-10-CM | POA: Insufficient documentation

## 2018-11-15 DIAGNOSIS — G4730 Sleep apnea, unspecified: Secondary | ICD-10-CM | POA: Diagnosis not present

## 2018-11-15 DIAGNOSIS — I10 Essential (primary) hypertension: Secondary | ICD-10-CM | POA: Diagnosis not present

## 2018-11-15 DIAGNOSIS — Z7984 Long term (current) use of oral hypoglycemic drugs: Secondary | ICD-10-CM | POA: Diagnosis not present

## 2018-11-15 DIAGNOSIS — I110 Hypertensive heart disease with heart failure: Secondary | ICD-10-CM | POA: Insufficient documentation

## 2018-11-15 DIAGNOSIS — E119 Type 2 diabetes mellitus without complications: Secondary | ICD-10-CM | POA: Diagnosis not present

## 2018-11-15 DIAGNOSIS — Z6841 Body Mass Index (BMI) 40.0 and over, adult: Secondary | ICD-10-CM | POA: Diagnosis not present

## 2018-11-15 DIAGNOSIS — N433 Hydrocele, unspecified: Secondary | ICD-10-CM | POA: Insufficient documentation

## 2018-11-15 DIAGNOSIS — E785 Hyperlipidemia, unspecified: Secondary | ICD-10-CM | POA: Diagnosis not present

## 2018-11-15 DIAGNOSIS — I509 Heart failure, unspecified: Secondary | ICD-10-CM | POA: Diagnosis not present

## 2018-11-15 DIAGNOSIS — Z87891 Personal history of nicotine dependence: Secondary | ICD-10-CM | POA: Diagnosis not present

## 2018-11-15 DIAGNOSIS — Z79899 Other long term (current) drug therapy: Secondary | ICD-10-CM | POA: Diagnosis not present

## 2018-11-15 DIAGNOSIS — Z791 Long term (current) use of non-steroidal anti-inflammatories (NSAID): Secondary | ICD-10-CM | POA: Diagnosis not present

## 2018-11-15 DIAGNOSIS — N430 Encysted hydrocele: Secondary | ICD-10-CM | POA: Diagnosis not present

## 2018-11-15 HISTORY — DX: Presence of dental prosthetic device (complete) (partial): Z97.2

## 2018-11-15 HISTORY — PX: HYDROCELE EXCISION: SHX482

## 2018-11-15 HISTORY — DX: Dependence on other enabling machines and devices: Z99.89

## 2018-11-15 HISTORY — DX: Chronic combined systolic (congestive) and diastolic (congestive) heart failure: I50.42

## 2018-11-15 HISTORY — DX: Type 2 diabetes mellitus with diabetic neuropathy, unspecified: E11.40

## 2018-11-15 HISTORY — DX: Chronic kidney disease, stage 3 (moderate): N18.3

## 2018-11-15 HISTORY — DX: Unspecified osteoarthritis, unspecified site: M19.90

## 2018-11-15 HISTORY — DX: Type 2 diabetes mellitus without complications: Z79.4

## 2018-11-15 HISTORY — DX: Localized edema: R60.0

## 2018-11-15 HISTORY — DX: Nocturia: R35.1

## 2018-11-15 HISTORY — DX: Chronic kidney disease, stage 3 unspecified: N18.30

## 2018-11-15 HISTORY — DX: Unspecified hearing loss, unspecified ear: H91.90

## 2018-11-15 HISTORY — DX: Abdominal aortic aneurysm, without rupture: I71.4

## 2018-11-15 HISTORY — DX: Other forms of dyspnea: R06.09

## 2018-11-15 HISTORY — DX: Complete loss of teeth, unspecified cause, unspecified class: K08.109

## 2018-11-15 HISTORY — DX: Dyspnea, unspecified: R06.00

## 2018-11-15 HISTORY — DX: Essential (primary) hypertension: I10

## 2018-11-15 HISTORY — DX: Mixed hyperlipidemia: E78.2

## 2018-11-15 HISTORY — DX: Hydrocele, unspecified: N43.3

## 2018-11-15 HISTORY — DX: Obstructive sleep apnea (adult) (pediatric): G47.33

## 2018-11-15 HISTORY — DX: Abdominal aortic aneurysm, without rupture, unspecified: I71.40

## 2018-11-15 HISTORY — DX: Type 2 diabetes mellitus without complications: E11.9

## 2018-11-15 LAB — POCT I-STAT, CHEM 8
BUN: 19 mg/dL (ref 8–23)
Calcium, Ion: 1.16 mmol/L (ref 1.15–1.40)
Chloride: 102 mmol/L (ref 98–111)
Creatinine, Ser: 1.7 mg/dL — ABNORMAL HIGH (ref 0.61–1.24)
Glucose, Bld: 140 mg/dL — ABNORMAL HIGH (ref 70–99)
HCT: 42 % (ref 39.0–52.0)
Hemoglobin: 14.3 g/dL (ref 13.0–17.0)
Potassium: 4.3 mmol/L (ref 3.5–5.1)
Sodium: 138 mmol/L (ref 135–145)
TCO2: 27 mmol/L (ref 22–32)

## 2018-11-15 LAB — GLUCOSE, CAPILLARY: Glucose-Capillary: 129 mg/dL — ABNORMAL HIGH (ref 70–99)

## 2018-11-15 SURGERY — HYDROCELECTOMY
Anesthesia: General | Laterality: Bilateral

## 2018-11-15 MED ORDER — FENTANYL CITRATE (PF) 100 MCG/2ML IJ SOLN
INTRAMUSCULAR | Status: DC | PRN
  Administered 2018-11-15: 50 ug via INTRAVENOUS
  Administered 2018-11-15: 25 ug via INTRAVENOUS
  Administered 2018-11-15 (×2): 50 ug via INTRAVENOUS
  Administered 2018-11-15: 25 ug via INTRAVENOUS

## 2018-11-15 MED ORDER — CEFAZOLIN SODIUM-DEXTROSE 2-4 GM/100ML-% IV SOLN
2.0000 g | Freq: Once | INTRAVENOUS | Status: AC
  Administered 2018-11-15: 2 g via INTRAVENOUS
  Filled 2018-11-15: qty 100

## 2018-11-15 MED ORDER — ONDANSETRON HCL 4 MG/2ML IJ SOLN
INTRAMUSCULAR | Status: DC | PRN
  Administered 2018-11-15: 4 mg via INTRAVENOUS

## 2018-11-15 MED ORDER — HYDROCODONE-ACETAMINOPHEN 5-325 MG PO TABS: 1.0000 | ORAL_TABLET | Freq: Four times a day (QID) | ORAL | 0 refills | Status: DC | PRN

## 2018-11-15 MED ORDER — PROPOFOL 10 MG/ML IV BOLUS
INTRAVENOUS | Status: DC | PRN
  Administered 2018-11-15: 20 mg via INTRAVENOUS
  Administered 2018-11-15: 150 mg via INTRAVENOUS
  Administered 2018-11-15: 20 mg via INTRAVENOUS

## 2018-11-15 MED ORDER — ONDANSETRON HCL 4 MG/2ML IJ SOLN
INTRAMUSCULAR | Status: AC
  Filled 2018-11-15: qty 2

## 2018-11-15 MED ORDER — DEXAMETHASONE SODIUM PHOSPHATE 4 MG/ML IJ SOLN
INTRAMUSCULAR | Status: DC | PRN
  Administered 2018-11-15: 5 mg via INTRAVENOUS

## 2018-11-15 MED ORDER — SODIUM CHLORIDE 0.9 % IV SOLN
INTRAVENOUS | Status: DC
  Administered 2018-11-15 (×2): via INTRAVENOUS
  Filled 2018-11-15: qty 1000

## 2018-11-15 MED ORDER — DEXAMETHASONE SODIUM PHOSPHATE 10 MG/ML IJ SOLN
INTRAMUSCULAR | Status: AC
  Filled 2018-11-15: qty 1

## 2018-11-15 MED ORDER — HYDROCODONE-ACETAMINOPHEN 5-325 MG PO TABS: 1.0000 | ORAL_TABLET | Freq: Four times a day (QID) | ORAL | 0 refills | Status: AC | PRN

## 2018-11-15 MED ORDER — FENTANYL CITRATE (PF) 100 MCG/2ML IJ SOLN
INTRAMUSCULAR | Status: AC
  Filled 2018-11-15: qty 2

## 2018-11-15 MED ORDER — LIDOCAINE 2% (20 MG/ML) 5 ML SYRINGE
INTRAMUSCULAR | Status: DC | PRN
  Administered 2018-11-15: 100 mg via INTRAVENOUS

## 2018-11-15 MED ORDER — CEFAZOLIN SODIUM-DEXTROSE 2-4 GM/100ML-% IV SOLN
INTRAVENOUS | Status: AC
  Filled 2018-11-15: qty 100

## 2018-11-15 MED ORDER — LIDOCAINE 2% (20 MG/ML) 5 ML SYRINGE
INTRAMUSCULAR | Status: AC
  Filled 2018-11-15: qty 5

## 2018-11-15 MED ORDER — PROPOFOL 10 MG/ML IV BOLUS
INTRAVENOUS | Status: AC
  Filled 2018-11-15: qty 40

## 2018-11-15 MED ORDER — FENTANYL CITRATE (PF) 100 MCG/2ML IJ SOLN
25.0000 ug | INTRAMUSCULAR | Status: DC | PRN
  Administered 2018-11-15 (×2): 50 ug via INTRAVENOUS
  Filled 2018-11-15: qty 1

## 2018-11-15 MED ORDER — OXYCODONE HCL 5 MG PO TABS
ORAL_TABLET | ORAL | Status: AC
  Filled 2018-11-15: qty 1

## 2018-11-15 MED ORDER — ONDANSETRON HCL 4 MG/2ML IJ SOLN
4.0000 mg | Freq: Once | INTRAMUSCULAR | Status: DC | PRN
  Filled 2018-11-15: qty 2

## 2018-11-15 SURGICAL SUPPLY — 42 items
BLADE CLIPPER SURG (BLADE) IMPLANT
BLADE SURG 15 STRL LF DISP TIS (BLADE) ×1 IMPLANT
BLADE SURG 15 STRL SS (BLADE) ×1
BNDG GAUZE ELAST 4 BULKY (GAUZE/BANDAGES/DRESSINGS) ×2 IMPLANT
CANISTER SUCT 3000ML PPV (MISCELLANEOUS) IMPLANT
CANISTER SUCTION 1200CC (MISCELLANEOUS) IMPLANT
CLEANER CAUTERY TIP 5X5 PAD (MISCELLANEOUS) IMPLANT
COVER BACK TABLE 60X90IN (DRAPES) ×2 IMPLANT
COVER MAYO STAND STRL (DRAPES) ×2 IMPLANT
COVER WAND RF STERILE (DRAPES) ×2 IMPLANT
DISSECTOR ROUND CHERRY 3/8 STR (MISCELLANEOUS) IMPLANT
DRAIN PENROSE 18X1/2 LTX STRL (DRAIN) IMPLANT
DRAIN PENROSE 18X1/4 LTX STRL (WOUND CARE) ×2 IMPLANT
DRAPE LAPAROTOMY 100X72 PEDS (DRAPES) ×2 IMPLANT
ELECT REM PT RETURN 9FT ADLT (ELECTROSURGICAL) ×2
ELECTRODE REM PT RTRN 9FT ADLT (ELECTROSURGICAL) ×1 IMPLANT
GAUZE SPONGE 4X4 12PLY STRL (GAUZE/BANDAGES/DRESSINGS) ×2 IMPLANT
GLOVE BIO SURGEON STRL SZ8 (GLOVE) ×2 IMPLANT
GOWN STRL REUS W/ TWL LRG LVL3 (GOWN DISPOSABLE) ×1 IMPLANT
GOWN STRL REUS W/ TWL XL LVL3 (GOWN DISPOSABLE) ×1 IMPLANT
GOWN STRL REUS W/TWL LRG LVL3 (GOWN DISPOSABLE) ×1
GOWN STRL REUS W/TWL XL LVL3 (GOWN DISPOSABLE) ×1
KIT TURNOVER CYSTO (KITS) ×2 IMPLANT
NEEDLE HYPO 25X1 1.5 SAFETY (NEEDLE) IMPLANT
NS IRRIG 500ML POUR BTL (IV SOLUTION) ×2 IMPLANT
PACK BASIN DAY SURGERY FS (CUSTOM PROCEDURE TRAY) ×2 IMPLANT
PAD CLEANER CAUTERY TIP 5X5 (MISCELLANEOUS)
PENCIL BUTTON HOLSTER BLD 10FT (ELECTRODE) ×2 IMPLANT
SUPPORT SCROTAL LG STRP (MISCELLANEOUS) ×2 IMPLANT
SUT CHROMIC 3 0 SH 27 (SUTURE) ×4 IMPLANT
SUT ETHILON 3 0 PS 1 (SUTURE) ×2 IMPLANT
SUT SILK 4 0 TIES 17X18 (SUTURE) IMPLANT
SUT VIC AB 4-0 RB1 27 (SUTURE)
SUT VIC AB 4-0 RB1 27X BRD (SUTURE) IMPLANT
SUT VICRYL 6 0 RB 1 (SUTURE) IMPLANT
SYR CONTROL 10ML LL (SYRINGE) ×2 IMPLANT
TOWEL OR 17X24 6PK STRL BLUE (TOWEL DISPOSABLE) ×4 IMPLANT
TRAY DSU PREP LF (CUSTOM PROCEDURE TRAY) ×2 IMPLANT
TUBE CONNECTING 12X1/4 (SUCTIONS) ×2 IMPLANT
WATER STERILE IRR 3000ML UROMA (IV SOLUTION) IMPLANT
WATER STERILE IRR 500ML POUR (IV SOLUTION) ×2 IMPLANT
YANKAUER SUCT BULB TIP NO VENT (SUCTIONS) ×2 IMPLANT

## 2018-11-15 NOTE — Anesthesia Procedure Notes (Signed)
Procedure Name: LMA Insertion Date/Time: 11/15/2018 9:03 AM Performed by: Murvin Natal, MD Pre-anesthesia Checklist: Patient identified, Emergency Drugs available, Suction available and Patient being monitored Patient Re-evaluated:Patient Re-evaluated prior to induction Oxygen Delivery Method: Circle system utilized Preoxygenation: Pre-oxygenation with 100% oxygen Induction Type: IV induction Ventilation: Mask ventilation without difficulty LMA: LMA inserted LMA Size: 5.0 Number of attempts: 1 Airway Equipment and Method: Bite block Placement Confirmation: positive ETCO2 Tube secured with: Tape Dental Injury: Teeth and Oropharynx as per pre-operative assessment

## 2018-11-15 NOTE — Anesthesia Postprocedure Evaluation (Signed)
Anesthesia Post Note  Patient: Bradley Alvarez  Procedure(s) Performed: HYDROCELECTOMY ADULT (Bilateral )     Patient location during evaluation: PACU Anesthesia Type: General Level of consciousness: awake and alert Pain management: pain level controlled Vital Signs Assessment: post-procedure vital signs reviewed and stable Respiratory status: spontaneous breathing, nonlabored ventilation, respiratory function stable and patient connected to nasal cannula oxygen Cardiovascular status: blood pressure returned to baseline and stable Postop Assessment: no apparent nausea or vomiting Anesthetic complications: no    Last Vitals:  Vitals:   11/15/18 1115 11/15/18 1130  BP: (!) 150/70 133/66  Pulse: 63 63  Resp: 12 16  Temp:    SpO2: 95% 95%    Last Pain:  Vitals:   11/15/18 1200  TempSrc:   PainSc: 4                  Danarius Mcconathy P Maila Dukes

## 2018-11-15 NOTE — Op Note (Addendum)
PATIENT: Bradley Alvarez   PRE-OPERATIVE DIAGNOSIS: Bilateral hydroceles   POST-OPERATIVE DIAGNOSIS: Bilateral hydroceles   PROCEDURE:  Procedure(s): Bilateral hydrocelectomy  SURGEON:  Claybon Jabs  RESIDENT: Coralie Common   INDICATION: Bradley Alvarez  is a 77 year old male who presented with very large bilateral hydroceles associated with discomfort. He elected to proceed with surgical correction. Following a discussion of the risks and benefits, informed consent was obtained.    ANESTHESIA:  General   EBL:  Minimal   DRAINS: 1/4 inch penrose bilaterally   LOCAL MEDICATIONS USED: 1/4 percent Marcaine with epinephrine   SPECIMEN:  None     DISPOSITION OF SPECIMEN:  N/A   Description of procedure: After informed consent the patient was brought to the major OR, placed on the table and administered general anesthesia. His genitalia was then sterilely prepped and draped. An official timeout was then performed.   A midline median raphae scrotal incision was then made and carried down over the right hydrocele. The tissue over the hydrocele was cleared using a combination of sharp and blunt technique. The hydrocele was then opened, drained of clear amber fluid and delivered through the incision. The excess hydrocele sac tissue was excised with the Bovie electrocautery and then I cauterized the edges. I then reapproximated the edges posteriorly behind the epididymis with a running, locking 3-0 chromic suture.    His testicle was then replaced in the normal anatomic position and his left hemiscrotum.   Attention was turned to the right testicle and the same procedure was repeated on the right side. Briefly, the tissue over the hydrocele was cleared using a combination of sharp and blunt technique. The hydrocele was then opened, drained of clear amber fluid and delivered through the incision. The excess hydrocele sac tissue was excised with the Bovie electrocautery and then I  cauterized the edges. I then reapproximated the edges posteriorly behind the epididymis with a running, locking 3-0 chromic suture.    His testicle was then replaced in the normal anatomic position and his right hemiscrotum.   1/4 inch penrose drains were placed in the dependent portion of the scrotum bilaterally and secured in place with nylon suture.   I injected quarter percent Marcaine in the subcutaneous tissue and closed the skin with running 3-0 chromic. Neosporin, a sterile gauze dressing, mummy wrap with Kerlix were applied. The patient tolerated the procedure well no intraoperative complications. Needle sponge and instrument counts were correct at the end of the operation.   A total of 1900 cc of hydrocele fluid was drained from his right and left hydroceles.   PLAN OF CARE: Discharge to home after PACU   PATIENT DISPOSITION:  PACU - hemodynamically stable.

## 2018-11-15 NOTE — Discharge Instructions (Signed)
Scrotal surgery postoperative instructions  Wound:  In most cases your incision will have absorbable sutures that will dissolve within the first 10-20 days. Some will fall out even earlier. Expect some redness as the sutures dissolved but this should occur only around the sutures. If there is generalized redness, especially with increasing pain or swelling, let us know. The scrotum will very likely get "black and blue" as the blood in the tissues spread. Sometimes the whole scrotum will turn colors. The black and blue is followed by a yellow and brown color. In time, all the discoloration will go away. In some cases some firm swelling in the area of the testicle may persist for up to 4-6 weeks after the surgery and is considered normal in most cases.  Diet:  You may return to your normal diet within 24 hours following your surgery. You may note some mild nausea and possibly vomiting the first 6-8 hours following surgery. This is usually due to the side effects of anesthesia, and will disappear quite soon. I would suggest clear liquids and a very light meal the first evening following your surgery.  Activity:  Your physical activity should be restricted the first 48 hours. During that time you should remain relatively inactive, moving about only when necessary. During the first 7-10 days following surgery he should avoid lifting any heavy objects (anything greater than 15 pounds), and avoid strenuous exercise. If you work, ask [place] specifically about your restrictions, both for work and home. We will write a note to your employer if needed.  You should plan to wear a tight pair of jockey shorts or an athletic supporter for the first 4-5 days, even to sleep. This will keep the scrotum immobilized to some degree and keep the swelling down.  Ice packs should be placed on and off over the scrotum for the first 48 hours. Frozen peas or corn in a ZipLock bag can be frozen, used and re-frozen. Fifteen minutes  on and 15 minutes off is a reasonable schedule. The ice is a good pain reliever and keeps the swelling down.  Hygiene:  You may shower 48 hours after your surgery. Tub bathing should be restricted until the seventh day.          Medication:  You will be sent home with some type of pain medication. In many cases you will be sent home with a narcotic pain pill (hydrococone or oxycodone). If the pain is not too bad, you may take either Tylenol (acetaminophen) or Advil (ibuprofen) which contain no narcotic agents, and might be tolerated a little better, with fewer side effects. If the pain medication you are sent home with does not control the pain, you will have to let us know. Some narcotic pain medications cannot be given or refilled by a phone call to a pharmacy.  Problems you should report to [place]:   Fever of 101.0 degrees Fahrenheit or greater.  Moderate or severe swelling under the skin incision or involving the scrotum.  Drug reaction such as hives, a rash, nausea or vomiting.  You may resume your aspirin 81 mg after your drains are removed.    Post Anesthesia Home Care Instructions  Activity: Get plenty of rest for the remainder of the day. A responsible individual must stay with you for 24 hours following the procedure.  For the next 24 hours, DO NOT: -Drive a car -Paediatric nurse -Drink alcoholic beverages -Take any medication unless instructed by your physician -Make any legal decisions  or sign important papers.  Meals: Start with liquid foods such as gelatin or soup. Progress to regular foods as tolerated. Avoid greasy, spicy, heavy foods. If nausea and/or vomiting occur, drink only clear liquids until the nausea and/or vomiting subsides. Call your physician if vomiting continues.  Special Instructions/Symptoms: Your throat may feel dry or sore from the anesthesia or the breathing tube placed in your throat during surgery. If this causes discomfort, gargle with  warm salt water. The discomfort should disappear within 24 hours.

## 2018-11-15 NOTE — Anesthesia Preprocedure Evaluation (Addendum)
Anesthesia Evaluation  Patient identified by MRN, date of birth, ID band Patient awake    Reviewed: Allergy & Precautions, NPO status , Patient's Chart, lab work & pertinent test results  Airway Mallampati: II  TM Distance: >3 FB Neck ROM: Full    Dental  (+) Edentulous Upper, Edentulous Lower   Pulmonary sleep apnea and Continuous Positive Airway Pressure Ventilation , former smoker,    Pulmonary exam normal breath sounds clear to auscultation       Cardiovascular hypertension, Pt. on home beta blockers and Pt. on medications +CHF  Normal cardiovascular exam Rhythm:Regular Rate:Normal  ECHO (2017) : LV EF: 60% -   65%  Sees cardiologist ( [place])  AAA   Neuro/Psych negative neurological ROS  negative psych ROS   GI/Hepatic negative GI ROS, Neg liver ROS,   Endo/Other  diabetes, Oral Hypoglycemic AgentsMorbid obesity  Renal/GU CRFRenal disease     Musculoskeletal negative musculoskeletal ROS (+)   Abdominal (+) + obese,   Peds  Hematology HLD   Anesthesia Other Findings BILATERAL HYDROCELES  Reproductive/Obstetrics                            Anesthesia Physical Anesthesia Plan  ASA: III  Anesthesia Plan: General   Post-op Pain Management:    Induction: Intravenous  PONV Risk Score and Plan: 2 and Ondansetron, Dexamethasone and Treatment may vary due to age or medical condition  Airway Management Planned: LMA  Additional Equipment:   Intra-op Plan:   Post-operative Plan: Extubation in OR  Informed Consent: I have reviewed the patients History and Physical, chart, labs and discussed the procedure including the risks, benefits and alternatives for the proposed anesthesia with the patient or authorized representative who has indicated his/her understanding and acceptance.   Dental advisory given  Plan Discussed with: CRNA  Anesthesia Plan Comments:          Anesthesia Quick Evaluation

## 2018-11-15 NOTE — Transfer of Care (Signed)
Last Vitals:  Vitals Value Taken Time  BP 132/74 11/15/2018 10:30 AM  Temp 36.8 C 11/15/2018 10:28 AM  Pulse 67 11/15/2018 10:31 AM  Resp 15 11/15/2018 10:31 AM  SpO2 96 % 11/15/2018 10:31 AM  Vitals shown include unvalidated device data.  Last Pain:  Vitals:   11/15/18 0720  TempSrc:   PainSc: 0-No pain      Patients Stated Pain Goal: 5 (11/15/18 0720) Immediate Anesthesia Transfer of Care Note  Patient: Bradley Alvarez  Procedure(s) Performed: Procedure(s) (LRB): HYDROCELECTOMY ADULT (Bilateral)  Patient Location: PACU  Anesthesia Type: General  Level of Consciousness: awake, alert  and oriented  Airway & Oxygen Therapy: Patient Spontanous Breathing and Patient connected to nasal cannula oxygen  Post-op Assessment: Report given to PACU RN and Post -op Vital signs reviewed and stable  Post vital signs: Reviewed and stable  Complications: No apparent anesthesia complications

## 2018-11-16 ENCOUNTER — Encounter (HOSPITAL_BASED_OUTPATIENT_CLINIC_OR_DEPARTMENT_OTHER): Payer: Self-pay | Admitting: Urology

## 2018-11-25 DIAGNOSIS — N433 Hydrocele, unspecified: Secondary | ICD-10-CM | POA: Diagnosis not present

## 2018-12-14 DIAGNOSIS — Z961 Presence of intraocular lens: Secondary | ICD-10-CM | POA: Diagnosis not present

## 2018-12-14 DIAGNOSIS — H40013 Open angle with borderline findings, low risk, bilateral: Secondary | ICD-10-CM | POA: Diagnosis not present

## 2018-12-14 DIAGNOSIS — E119 Type 2 diabetes mellitus without complications: Secondary | ICD-10-CM | POA: Diagnosis not present

## 2018-12-14 DIAGNOSIS — H35371 Puckering of macula, right eye: Secondary | ICD-10-CM | POA: Diagnosis not present

## 2018-12-16 DIAGNOSIS — N183 Chronic kidney disease, stage 3 (moderate): Secondary | ICD-10-CM | POA: Diagnosis not present

## 2018-12-16 DIAGNOSIS — E1129 Type 2 diabetes mellitus with other diabetic kidney complication: Secondary | ICD-10-CM | POA: Diagnosis not present

## 2018-12-16 DIAGNOSIS — I5042 Chronic combined systolic (congestive) and diastolic (congestive) heart failure: Secondary | ICD-10-CM | POA: Diagnosis not present

## 2018-12-16 DIAGNOSIS — N433 Hydrocele, unspecified: Secondary | ICD-10-CM | POA: Diagnosis not present

## 2018-12-16 DIAGNOSIS — Z6841 Body Mass Index (BMI) 40.0 and over, adult: Secondary | ICD-10-CM | POA: Diagnosis not present

## 2018-12-16 DIAGNOSIS — I130 Hypertensive heart and chronic kidney disease with heart failure and stage 1 through stage 4 chronic kidney disease, or unspecified chronic kidney disease: Secondary | ICD-10-CM | POA: Diagnosis not present

## 2018-12-17 DIAGNOSIS — R69 Illness, unspecified: Secondary | ICD-10-CM | POA: Diagnosis not present

## 2018-12-24 ENCOUNTER — Ambulatory Visit: Payer: Medicare HMO | Admitting: Podiatry

## 2018-12-24 ENCOUNTER — Encounter: Payer: Self-pay | Admitting: Podiatry

## 2018-12-24 DIAGNOSIS — E119 Type 2 diabetes mellitus without complications: Secondary | ICD-10-CM | POA: Diagnosis not present

## 2018-12-24 DIAGNOSIS — B351 Tinea unguium: Secondary | ICD-10-CM

## 2018-12-24 DIAGNOSIS — M79675 Pain in left toe(s): Secondary | ICD-10-CM | POA: Diagnosis not present

## 2018-12-24 DIAGNOSIS — M2140 Flat foot [pes planus] (acquired), unspecified foot: Secondary | ICD-10-CM | POA: Diagnosis not present

## 2018-12-24 DIAGNOSIS — M79674 Pain in right toe(s): Secondary | ICD-10-CM | POA: Diagnosis not present

## 2018-12-24 NOTE — Progress Notes (Signed)
Patient ID: Bradley Alvarez, male   DOB: 1941/05/21, 78 y.o.   MRN: [id number] Complaint:  Visit Type: Patient returns to my office for continued preventative foot care services. Complaint: Patient states" my nails have grown long and thick and become painful to walk and wear shoes" Patient has been diagnosed with DM with no complications. He presents for preventative foot care services. Eye surgery eyes B/L.  Podiatric Exam: Vascular: dorsalis pedis and posterior tibial pulses are palpable bilateral. Capillary return is immediate. Temperature gradient is WNL. Skin turgor WNL  Sensorium: Normal Semmes Weinstein monofilament test. Normal tactile sensation bilaterally. Nail Exam: Pt has thick disfigured discolored nails with subungual debris noted bilateral entire nail hallux through fifth toenails Ulcer Exam: There is no evidence of ulcer or pre-ulcerative changes or infection. Orthopedic Exam: Muscle tone and strength are WNL. No limitations in general ROM. No crepitus or effusions noted. PTT tendinitis/arthritis left foot.  HAV  B/L.  Pes planus B/L Skin: No Porokeratosis. No infection or ulcers  Diagnosis:  Tinea unguium, Pain in right toe, pain in left toes,  HAV  B/L  PTT tendinitis  Pes planus  Treatment & Plan Procedures and Treatment: Consent by patient was obtained for treatment procedures. The patient understood the discussion of treatment and procedures well. All questions were answered thoroughly reviewed. Debridement of mycotic and hypertrophic toenails, 1 through 5 bilateral and clearing of subungual debris. No ulceration, no infection noted.  Return Visit-Office Procedure: Patient instructed to return to the office for a follow up visit 3 months for continued evaluation and treatment.   Gardiner Barefoot DPM

## 2018-12-25 ENCOUNTER — Other Ambulatory Visit: Payer: Self-pay | Admitting: Cardiology

## 2018-12-25 DIAGNOSIS — Z136 Encounter for screening for cardiovascular disorders: Secondary | ICD-10-CM

## 2018-12-25 DIAGNOSIS — I77810 Thoracic aortic ectasia: Secondary | ICD-10-CM

## 2019-01-24 ENCOUNTER — Ambulatory Visit: Payer: Medicare HMO

## 2019-01-24 DIAGNOSIS — Z136 Encounter for screening for cardiovascular disorders: Secondary | ICD-10-CM

## 2019-01-24 DIAGNOSIS — I77810 Thoracic aortic ectasia: Secondary | ICD-10-CM

## 2019-01-29 ENCOUNTER — Telehealth: Payer: Self-pay | Admitting: Cardiology

## 2019-01-29 DIAGNOSIS — I714 Abdominal aortic aneurysm, without rupture, unspecified: Secondary | ICD-10-CM

## 2019-02-03 NOTE — Progress Notes (Signed)
Patient is here for follow up visit.  Subjective:   _0  ID: Bradley Alvarez, male    DOB: 05/09/1941, 78 y.o.   MRN: [id number]  Chief Complaint  Patient presents with  . Hypertension  . Results    echo, vasc US  . Follow-up     HPI  78 year old African-American male with uncontrolled hypertension, uncontrolled type II diabetes mellitus, CKD stage 2/3, OSA on CPAP, heart failure with preserved ejection fraction, here for f/u.  At last visit, his blood pressure was well controlled.  Patient was fairly euvolemic.  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  His biggest complaint at that visit was hydrocele for which she had an upcoming surgery scheduled with Dr. Karsten Ro.  Patient is here for 39-monthfollow-up. Since his last visit, he underwent surgery for his hydrocele. He is doing well and denies chest pain, shortness of breath, palpitations, leg edema, orthopnea, PND, TIA/syncope. He has cut down on his alcohol intake. He asks if he could use Viagra.    Past Medical History:  Diagnosis Date  . AAA (abdominal aortic aneurysm) without rupture ([facility]    followed by dr pDerek Jack-  per office note dated 11-05-2018  3.5cm  . Benign essential HTN    cardiologist-- dr pMargart Sickles . Chronic combined systolic (congestive) and diastolic (congestive) heart failure ([facility]    cardiologist-- dr pRobb Matar . CKD [place]chronic kidney disease), stage III [place]   . Diabetes mellitus type 2, insulin dependent ([place]    followed by dr rVirgina Jock . Diabetic [place] ([place]   . DOE (dyspnea on exertion)   . Edema of both lower extremities   . Full dentures   . HOH (hard of hearing)    does wear hearing aids  . Hydrocele, bilateral   . Lichen planus   . Mixed hyperlipidemia   . Morbid obesity with BMI of 45.0-49.9, adult ([place]   . Nocturia   . OA (osteoarthritis)   . OSA on CPAP 10/14/08   per study  AHI   61.8/hr ,  RDI  96.1/hr.      Past Surgical History:  Procedure Laterality Date  . CARDIAC CATHETERIZATION  per pt yrs ago   was told no blockage  . CARDIOVASCULAR STRESS TEST  07-19-2014   dr tShelva Majestic  Low risk nuclear study w/ small apical attentuation artifact/  normal LV function and wall motion , ef 53%  . CATARACT EXTRACTION W/ INTRAOCULAR LENS  IMPLANT, BILATERAL  2018  . COLONOSCOPY W/ BIOPSIES AND POLYPECTOMY    . COLONOSCOPY WITH PROPOFOL N/A 05/16/2016   Procedure: COLONOSCOPY WITH PROPOFOL;  Surgeon: PCarol Ada MD;  Location: [facility];  Service: Endoscopy;  Laterality: N/A;  . HYDROCELE EXCISION Bilateral 11/15/2018   Procedure: HYDROCELECTOMY ADULT;  Surgeon: OKathie Rhodes MD;  [facility]  Service: Urology;  Laterality: Bilateral;  . KNEE ARTHROSCOPY Right 2009  . MULTIPLE TOOTH EXTRACTIONS    . ORIF ANKLE FRACTURE Left 09/13/2014   Procedure: Open Reduction Internal Fixation Left Ankle Medial Malleolus;  Surgeon: MNewt Minion MD;  [place]  Service: Orthopedics;  Laterality: Left;  . PROSTATECTOMY  1993 approx.  by dr dDiona Fanti  for prostate enlargement  . TOTAL KNEE ARTHROPLASTY Right 08/27/2009   dr wNoemi Chapel_1   . TOTAL KNEE ARTHROPLASTY  12/20/2012   Procedure: TOTAL KNEE ARTHROPLASTY;  Surgeon: RLorn Junes MD;  Location: [facility];  Service: Orthopedics;  Laterality: Left;  left total knee arthroplasty  . TRANSTHORACIC ECHOCARDIOGRAM  11-04-2018   dr Tavien Chestnut   moderate to severe concentric LVH, ef 56%,  grade 1 diastolic dysfunciton/  mild AV calcification annulus and leaflets without stentosis ,  mild regurg./  moderate LAE/ dilated aortic root, 4.5cm/  mild MV annulus calcification with trace regurg./  trace TR     Social History   Socioeconomic History  . Marital status: Married    Spouse name: Not on file  . Number of children: 5  . Years of education: Not on file  . Highest education level: Not on file    Occupational History  . Not on file  Social Needs  . Financial resource strain: Not on file  . Food insecurity:    Worry: Not on file    Inability: Not on file  . Transportation needs:    Medical: Not on file    Non-medical: Not on file  Tobacco Use  . Smoking status: Former Smoker    Packs/day: 1.50    Years: 10.00    Pack years: 15.00    Types: Cigarettes    Last attempt to quit: 12/14/1971    Years since quitting: 47.1  . Smokeless tobacco: Former Systems developer    Types: Snuff, Chew  Substance and Sexual Activity  . Alcohol use: Not Currently    Alcohol/week: 1.0 standard drinks    Types: 1 Standard drinks or equivalent per week    Comment: one shot qhs  . Drug use: No  . Sexual activity: Not on file  Lifestyle  . Physical activity:    Days per week: Not on file    Minutes per session: Not on file  . Stress: Not on file  Relationships  . Social connections:    Talks on phone: Not on file    Gets together: Not on file    Attends religious service: Not on file    Active member of club or organization: Not on file    Attends meetings of clubs or organizations: Not on file    Relationship status: Not on file  . Intimate partner violence:    Fear of current or ex partner: Not on file    Emotionally abused: Not on file    Physically abused: Not on file    Forced sexual activity: Not on file  Other Topics Concern  . Not on file  Social History Narrative  . Not on file     Current Outpatient Medications on File Prior to Visit  Medication Sig Dispense Refill  . aspirin EC 81 MG tablet Take 81 mg by mouth daily.    . carvedilol (COREG) 25 MG tablet Take 25 mg by mouth 2 (two) times daily with a meal.     . clobetasol ointment (TEMOVATE) 1.61 % Apply 1 application topically daily as needed (for itching).     . Cyanocobalamin 2500 MCG TABS Take 2,500 mcg by mouth daily.    . diclofenac sodium (VOLTAREN) 1 % GEL Apply 1 application topically 4 (four) times daily as needed (For  pain.).    Marland Kitchen docusate sodium 100 MG CAPS 1 tab 2 times a day while on narcotics.  STOOL SOFTENER (Patient taking differently: Take 1 capsule by mouth every evening. 1 tab 2 times a day while on narcotics.  STOOL SOFTENER) 60 capsule 0  . Dulaglutide (TRULICITY) 1.5 WR/6.0AV SOPN Inject 1 Dose into the skin once a week.  Wednesday's    . felodipine (PLENDIL) 5 MG 24 hr tablet Take 10 mg by mouth every morning.     . furosemide (LASIX) 40 MG tablet Take 40 mg by mouth every morning.     Marland Kitchen HYDROcodone-acetaminophen (NORCO/VICODIN) 5-325 MG tablet 2 (two) times daily.    . isosorbide-hydrALAZINE (BIDIL) 20-37.5 MG tablet Take 2 tablets by mouth 3 (three) times daily.     Marland Kitchen loratadine (CLARITIN) 10 MG tablet Take 10 mg by mouth every morning.     Marland Kitchen losartan (COZAAR) 100 MG tablet Take 100 mg by mouth every evening.     Marland Kitchen MEGARED OMEGA-3 KRILL OIL PO Take 1 tablet by mouth daily.    . metFORMIN (GLUCOPHAGE) 1000 MG tablet Take 1,000 mg by mouth 2 (two) times daily with a meal.    . Multiple Vitamin (MULTIVITAMIN WITH MINERALS) TABS tablet Take 1 tablet by mouth daily.    Vladimir Faster Glycol-Propyl Glycol (SYSTANE OP) Apply to eye as needed.    . simvastatin (ZOCOR) 40 MG tablet Take 40 mg by mouth every evening.    Marland Kitchen spironolactone (ALDACTONE) 50 MG tablet Take 1 tablet by mouth daily.    . Turmeric 400 MG CAPS Take by mouth 2 (two) times daily.    . urea (CARMOL) 10 % cream Apply topically as needed.    Glory Rosebush VERIO test strip USE TO TEST BID AS DIRECTED  2   No current facility-administered medications on file prior to visit.     Cardiovascular studies:  Abdominal aortic duplex 01/24/2019: Moderate dilatation of the abdominal aorta is noted in the mid and distal aorta. An abdominal aortic aneurysm measuring 3 x 3.12 x 3.13 cm is seen.  Normal iliac artery and aortic velocity. No change from 11/04/2017. Recheck in 2 years.  Echocardiogram 11/04/2018: 1. Left ventricle cavity is normal in size.  Moderate to severe concentric hypertrophy of the left ventricle. Normal global wall motion. Doppler evidence of grade I (impaired) diastolic dysfunction, elevated LAP. Calculated EF 56%. 2. Left atrial cavity is moderately dilated. 3. Mild (Grade I) aortic regurgitation. Mild calcification of the aortic valve annulus. Mild aortic valve leaflet calcification. 4. Trace mitral regurgitation. Mild calcification of the mitral valve annulus. 5. Trace tricuspid regurgitation. 6. The aortic root is dilated, measures 4.5 cm, no significant change in size c.f. echo. of 10/15/2017.  EKG 05/07/2018: Sinus rhythm 75 bpm. Normal axis. Normal conduction. Poor R-wave progression.   Recent labs:  Labs 08/27/2018: Glucose 110. BUN/Cr 13/1.4 eGFR 49/59. Na/K 140/4.1 BNP 12  Labs 04/13/2018: H/H 13/44. MCV 88. Platelets 161 Glucose 93. BUN/creatinine 11/1.3. EGFR 53/64. Sodium 138, potassium 4.5 Chol 135, TG 78, HDL 49 LDL 70 Apolipoprotein B 60 Normal TSH 2/1 normal   Review of Systems  Constitution: Negative for decreased appetite, malaise/fatigue, weight gain and weight loss.  HENT: Negative for congestion.   Eyes: Negative for visual disturbance.  Cardiovascular: Positive for dyspnea on exertion and leg swelling. Negative for chest pain, palpitations and syncope.  Respiratory: Negative for shortness of breath.   Endocrine: Negative for cold intolerance.  Hematologic/Lymphatic: Does not bruise/bleed easily.  Skin: Negative for itching and rash.  Musculoskeletal: Negative for myalgias.  Gastrointestinal: Negative for abdominal pain, nausea and vomiting.  Genitourinary: Negative for dysuria.  Neurological: Negative for dizziness and weakness.  Psychiatric/Behavioral: The patient is not nervous/anxious.   All other systems reviewed and are negative.      Objective:    Vitals:   02/04/19 0827  BP: (!) 142/82  Pulse: 71  SpO2: 94%     Physical Exam  Constitutional: He appears well-developed.  No distress.  HENT:  Head: Atraumatic.  Eyes: Conjunctivae are normal.  Neck: Neck supple. No JVD present. No thyromegaly present.  Cardiovascular: Normal rate, regular rhythm and normal heart sounds. Exam reveals no gallop.  No murmur heard. Pulses:      Carotid pulses are 2+ on the right side and 2+ on the left side.      Dorsalis pedis pulses are 2+ on the right side and 2+ on the left side.       Posterior tibial pulses are 2+ on the right side and 2+ on the left side.  Femoral and popliteal pulse difficult to feel due to patient's body habitus.   Pulmonary/Chest: Effort normal and breath sounds normal.  Abdominal: Soft. Bowel sounds are normal.  Musculoskeletal: Normal range of motion.        General: No edema.  Neurological: He is alert.  Skin: Skin is warm and dry.  Psychiatric: He has a normal mood and affect.        Assessment & Recommendations:    78 year old African-American male with uncontrolled hypertension, uncontrolled type II diabetes mellitus, CKD stage 2/3, OSA on CPAP, heart failure with preserved ejection fraction, here for f/u.  Hypertension: Fairly well conrolled on current antihypertensive therapy. Given he is on Bidil, I have asked him to avoid using sildenafil. If he must use this, avoid taking bidil for 48 hrs on either side of sildenafil use. In future, if he has improvement in his blood pressure with weight loss, may consider weaning him off Bidil.   Dilated aortic root: Stable at 4.5-4.6 cm on repeat echocardiograms in 2014, 2017, and 2018. Repeat echocardiogram in 09/2019.  Mild dilated abdominal aorta 3.5 cm. Repeat US in 09/2019.  If stable, would then repeat the above studies in 2023, as these are normal for his body habitus.   I will see him back in 6 months   Joniece Smotherman Esther Hardy, MD [facility] Pager: [phone] Office: [phone] If no answer Cell [phone]

## 2019-02-04 ENCOUNTER — Other Ambulatory Visit: Payer: Self-pay | Admitting: Cardiology

## 2019-02-04 ENCOUNTER — Ambulatory Visit: Payer: Medicare HMO | Admitting: Cardiology

## 2019-02-04 ENCOUNTER — Encounter: Payer: Self-pay | Admitting: Cardiology

## 2019-02-04 VITALS — BP 142/82 | HR 71 | Ht 74.0 in | Wt 313.8 lb

## 2019-02-04 DIAGNOSIS — I10 Essential (primary) hypertension: Secondary | ICD-10-CM

## 2019-02-04 DIAGNOSIS — I77810 Thoracic aortic ectasia: Secondary | ICD-10-CM

## 2019-02-04 DIAGNOSIS — I77811 Abdominal aortic ectasia: Secondary | ICD-10-CM

## 2019-02-04 DIAGNOSIS — I7789 Other specified disorders of arteries and arterioles: Secondary | ICD-10-CM

## 2019-02-04 NOTE — Patient Instructions (Signed)
Do not use Bidil and sildenafil/Viagra together. They should not be used within 48 hours of each other.

## 2019-02-05 ENCOUNTER — Other Ambulatory Visit: Payer: Self-pay | Admitting: Cardiology

## 2019-02-12 DIAGNOSIS — R69 Illness, unspecified: Secondary | ICD-10-CM | POA: Diagnosis not present

## 2019-03-07 ENCOUNTER — Encounter: Payer: Self-pay | Admitting: Cardiology

## 2019-03-07 DIAGNOSIS — Z0189 Encounter for other specified special examinations: Secondary | ICD-10-CM | POA: Insufficient documentation

## 2019-03-22 DIAGNOSIS — N433 Hydrocele, unspecified: Secondary | ICD-10-CM | POA: Diagnosis not present

## 2019-03-22 DIAGNOSIS — N5201 Erectile dysfunction due to arterial insufficiency: Secondary | ICD-10-CM | POA: Diagnosis not present

## 2019-03-25 ENCOUNTER — Ambulatory Visit: Payer: Medicare HMO | Admitting: Podiatry

## 2019-04-08 ENCOUNTER — Other Ambulatory Visit: Payer: Self-pay

## 2019-04-08 ENCOUNTER — Ambulatory Visit: Payer: Medicare HMO | Admitting: Podiatry

## 2019-04-08 ENCOUNTER — Encounter: Payer: Self-pay | Admitting: Podiatry

## 2019-04-08 VITALS — Temp 97.5°F

## 2019-04-08 DIAGNOSIS — M79675 Pain in left toe(s): Secondary | ICD-10-CM

## 2019-04-08 DIAGNOSIS — M2010 Hallux valgus (acquired), unspecified foot: Secondary | ICD-10-CM

## 2019-04-08 DIAGNOSIS — E119 Type 2 diabetes mellitus without complications: Secondary | ICD-10-CM | POA: Diagnosis not present

## 2019-04-08 DIAGNOSIS — B351 Tinea unguium: Secondary | ICD-10-CM

## 2019-04-08 DIAGNOSIS — M79674 Pain in right toe(s): Secondary | ICD-10-CM

## 2019-04-08 DIAGNOSIS — M2140 Flat foot [pes planus] (acquired), unspecified foot: Secondary | ICD-10-CM

## 2019-04-08 NOTE — Progress Notes (Signed)
Patient ID: Bradley Alvarez, male   DOB: [birth year], 78 y.o.   MRN: [id number] Complaint:  Visit Type: Patient returns to my office for continued preventative foot care services. Complaint: Patient states" my nails have grown long and thick and become painful to walk and wear shoes" Patient has been diagnosed with DM with no complications. He presents for preventative foot care services. Eye surgery eyes B/L.  Podiatric Exam: Vascular: dorsalis pedis and posterior tibial pulses are palpable bilateral. Capillary return is immediate. Temperature gradient is WNL. Skin turgor WNL  Sensorium: Normal Semmes Weinstein monofilament test. Normal tactile sensation bilaterally. Nail Exam: Pt has thick disfigured discolored nails with subungual debris noted bilateral entire nail hallux through fifth toenails Ulcer Exam: There is no evidence of ulcer or pre-ulcerative changes or infection. Orthopedic Exam: Muscle tone and strength are WNL. No limitations in general ROM. No crepitus or effusions noted. PTT tendinitis/arthritis left foot.  HAV  B/L.  Pes planus B/L Skin: No Porokeratosis. No infection or ulcers  Diagnosis:  Tinea unguium, Pain in right toe, pain in left toes,  HAV  B/L  PTT tendinitis  Pes planus  Treatment & Plan Procedures and Treatment: Consent by patient was obtained for treatment procedures. The patient understood the discussion of treatment and procedures well. All questions were answered thoroughly reviewed. Debridement of mycotic and hypertrophic toenails, 1 through 5 bilateral and clearing of subungual debris. No ulceration, no infection noted.  Return Visit-Office Procedure: Patient instructed to return to the office for a follow up visit 3 months for continued evaluation and treatment.   Gardiner Barefoot DPM

## 2019-04-21 DIAGNOSIS — R82998 Other abnormal findings in urine: Secondary | ICD-10-CM | POA: Diagnosis not present

## 2019-04-21 DIAGNOSIS — I10 Essential (primary) hypertension: Secondary | ICD-10-CM | POA: Diagnosis not present

## 2019-04-21 DIAGNOSIS — E7849 Other hyperlipidemia: Secondary | ICD-10-CM | POA: Diagnosis not present

## 2019-04-21 DIAGNOSIS — E1129 Type 2 diabetes mellitus with other diabetic kidney complication: Secondary | ICD-10-CM | POA: Diagnosis not present

## 2019-04-21 DIAGNOSIS — Z125 Encounter for screening for malignant neoplasm of prostate: Secondary | ICD-10-CM | POA: Diagnosis not present

## 2019-04-25 DIAGNOSIS — Z1331 Encounter for screening for depression: Secondary | ICD-10-CM | POA: Diagnosis not present

## 2019-04-28 DIAGNOSIS — I5042 Chronic combined systolic (congestive) and diastolic (congestive) heart failure: Secondary | ICD-10-CM | POA: Diagnosis not present

## 2019-04-28 DIAGNOSIS — Z0000 Encounter for general adult medical examination without abnormal findings: Secondary | ICD-10-CM | POA: Diagnosis not present

## 2019-04-28 DIAGNOSIS — I130 Hypertensive heart and chronic kidney disease with heart failure and stage 1 through stage 4 chronic kidney disease, or unspecified chronic kidney disease: Secondary | ICD-10-CM | POA: Diagnosis not present

## 2019-04-28 DIAGNOSIS — M1990 Unspecified osteoarthritis, unspecified site: Secondary | ICD-10-CM | POA: Diagnosis not present

## 2019-04-28 DIAGNOSIS — E1129 Type 2 diabetes mellitus with other diabetic kidney complication: Secondary | ICD-10-CM | POA: Diagnosis not present

## 2019-04-28 DIAGNOSIS — N183 Chronic kidney disease, stage 3 (moderate): Secondary | ICD-10-CM | POA: Diagnosis not present

## 2019-04-28 DIAGNOSIS — N433 Hydrocele, unspecified: Secondary | ICD-10-CM | POA: Diagnosis not present

## 2019-04-28 DIAGNOSIS — I714 Abdominal aortic aneurysm, without rupture: Secondary | ICD-10-CM | POA: Diagnosis not present

## 2019-04-28 DIAGNOSIS — K760 Fatty (change of) liver, not elsewhere classified: Secondary | ICD-10-CM | POA: Diagnosis not present

## 2019-06-12 NOTE — Telephone Encounter (Signed)
Patient aware of need for AAA testing

## 2019-06-16 DIAGNOSIS — H04123 Dry eye syndrome of bilateral lacrimal glands: Secondary | ICD-10-CM | POA: Diagnosis not present

## 2019-06-16 DIAGNOSIS — H40013 Open angle with borderline findings, low risk, bilateral: Secondary | ICD-10-CM | POA: Diagnosis not present

## 2019-07-05 ENCOUNTER — Other Ambulatory Visit: Payer: Self-pay | Admitting: Cardiology

## 2019-07-13 ENCOUNTER — Ambulatory Visit: Payer: Medicare HMO | Admitting: Podiatry

## 2019-07-22 DIAGNOSIS — Z23 Encounter for immunization: Secondary | ICD-10-CM | POA: Diagnosis not present

## 2019-08-03 ENCOUNTER — Encounter: Payer: Self-pay | Admitting: Podiatry

## 2019-08-03 ENCOUNTER — Other Ambulatory Visit: Payer: Self-pay

## 2019-08-03 ENCOUNTER — Ambulatory Visit: Payer: Medicare HMO | Admitting: Podiatry

## 2019-08-03 DIAGNOSIS — M79674 Pain in right toe(s): Secondary | ICD-10-CM | POA: Diagnosis not present

## 2019-08-03 DIAGNOSIS — B351 Tinea unguium: Secondary | ICD-10-CM | POA: Diagnosis not present

## 2019-08-03 DIAGNOSIS — E119 Type 2 diabetes mellitus without complications: Secondary | ICD-10-CM | POA: Diagnosis not present

## 2019-08-03 DIAGNOSIS — M79675 Pain in left toe(s): Secondary | ICD-10-CM

## 2019-08-03 NOTE — Progress Notes (Signed)
Patient ID: Bradley Alvarez, male   DOB: 01-Jun-1941, 78 y.o.   MRN: [id number] Complaint:  Visit Type: Patient returns to my office for continued preventative foot care services. Complaint: Patient states" my nails have grown long and thick and become painful to walk and wear shoes" Patient has been diagnosed with DM with no complications. He presents for preventative foot care services.   Podiatric Exam: Vascular: dorsalis pedis and posterior tibial pulses are palpable bilateral. Capillary return is immediate. Temperature gradient is WNL. Skin turgor WNL  Sensorium: Normal Semmes Weinstein monofilament test. Normal tactile sensation bilaterally. Nail Exam: Pt has thick disfigured discolored nails with subungual debris noted bilateral entire nail hallux through fifth toenails Ulcer Exam: There is no evidence of ulcer or pre-ulcerative changes or infection. Orthopedic Exam: Muscle tone and strength are WNL. No limitations in general ROM. No crepitus or effusions noted. PTT tendinitis/arthritis left foot.  HAV  B/L.  Pes planus B/L Skin: No Porokeratosis. No infection or ulcers  Diagnosis:  Tinea unguium, Pain in right toe, pain in left toes,  HAV  B/L  PTT tendinitis  Pes planus  Treatment & Plan Procedures and Treatment: Consent by patient was obtained for treatment procedures. The patient understood the discussion of treatment and procedures well. All questions were answered thoroughly reviewed. Debridement of mycotic and hypertrophic toenails, 1 through 5 bilateral and clearing of subungual debris. No ulceration, no infection noted.  Return Visit-Office Procedure: Patient instructed to return to the office for a follow up visit 3 months for continued evaluation and treatment.   Gardiner Barefoot DPM

## 2019-08-04 ENCOUNTER — Other Ambulatory Visit: Payer: Self-pay | Admitting: Cardiology

## 2019-08-13 ENCOUNTER — Other Ambulatory Visit: Payer: Self-pay | Admitting: Cardiology

## 2019-08-16 DIAGNOSIS — M1990 Unspecified osteoarthritis, unspecified site: Secondary | ICD-10-CM | POA: Diagnosis not present

## 2019-08-16 DIAGNOSIS — I130 Hypertensive heart and chronic kidney disease with heart failure and stage 1 through stage 4 chronic kidney disease, or unspecified chronic kidney disease: Secondary | ICD-10-CM | POA: Diagnosis not present

## 2019-08-16 DIAGNOSIS — N183 Chronic kidney disease, stage 3 (moderate): Secondary | ICD-10-CM | POA: Diagnosis not present

## 2019-08-16 DIAGNOSIS — E1129 Type 2 diabetes mellitus with other diabetic kidney complication: Secondary | ICD-10-CM | POA: Diagnosis not present

## 2019-08-16 DIAGNOSIS — I714 Abdominal aortic aneurysm, without rupture: Secondary | ICD-10-CM | POA: Diagnosis not present

## 2019-08-16 DIAGNOSIS — I5042 Chronic combined systolic (congestive) and diastolic (congestive) heart failure: Secondary | ICD-10-CM | POA: Diagnosis not present

## 2019-09-19 ENCOUNTER — Other Ambulatory Visit: Payer: Self-pay

## 2019-09-19 ENCOUNTER — Ambulatory Visit (INDEPENDENT_AMBULATORY_CARE_PROVIDER_SITE_OTHER): Payer: Medicare HMO

## 2019-09-19 DIAGNOSIS — I10 Essential (primary) hypertension: Secondary | ICD-10-CM | POA: Diagnosis not present

## 2019-09-19 DIAGNOSIS — I714 Abdominal aortic aneurysm, without rupture, unspecified: Secondary | ICD-10-CM

## 2019-09-19 DIAGNOSIS — I77810 Thoracic aortic ectasia: Secondary | ICD-10-CM | POA: Diagnosis not present

## 2019-09-22 ENCOUNTER — Other Ambulatory Visit: Payer: Self-pay | Admitting: Cardiology

## 2019-09-22 DIAGNOSIS — I714 Abdominal aortic aneurysm, without rupture, unspecified: Secondary | ICD-10-CM

## 2019-09-22 NOTE — Progress Notes (Deleted)
Patient is here for follow up visit.  Subjective:   _0  ID: Ancil Linsey, male    DOB: [birth year], 78 y.o.   MRN: [id number]  *** No chief complaint on file.    HPI  78 year old African-American male with uncontrolled hypertension, uncontrolled type II diabetes mellitus, CKD stage 2/3, OSA on CPAP, heart failure with preserved ejection fraction, here for f/u.  At last visit, his blood pressure was well controlled.  Patient was fairly euvolemic.  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  His biggest complaint at that visit was hydrocele for which she had an upcoming surgery scheduled with Dr. Karsten Ro.  Patient is here for 14-monthfollow-up. Since his last visit, he underwent surgery for his hydrocele. He is doing well and denies chest pain, shortness of breath, palpitations, leg edema, orthopnea, PND, TIA/syncope. He has cut down on his alcohol intake. He asks if he could use Viagra.    Past Medical History:  Diagnosis Date  . AAA (abdominal aortic aneurysm) without rupture ([facility]    followed by dr pDerek Jack-  per office note dated 11-05-2018  3.5cm  . Benign essential HTN    cardiologist-- dr pMargart Sickles . Chronic combined systolic (congestive) and diastolic (congestive) heart failure ([facility]    cardiologist-- dr pRobb Matar . CKD (chronic kidney disease), stage III ([place]   . Diabetes mellitus type 2, insulin dependent ([place]    followed by dr rVirgina Jock . Diabetic neuropathy ([place]   . DOE (dyspnea on exertion)   . Edema of both lower extremities   . Full dentures   . HOH (hard of hearing)    does wear hearing aids  . Hydrocele, bilateral   . Lichen planus   . Mixed hyperlipidemia   . Morbid obesity with BMI of 45.0-49.9, adult ([place]   . Nocturia   . OA (osteoarthritis)   . OSA on CPAP 10/14/08   per study  AHI  61.8/hr ,  RDI  96.1/hr.      Past Surgical History:  Procedure  Laterality Date  . CARDIAC CATHETERIZATION  per pt yrs ago   was told no blockage  . CARDIOVASCULAR STRESS TEST  07-19-2014   dr tShelva Majestic  Low risk nuclear study w/ small apical attentuation artifact/  normal LV function and wall motion , ef 53%  . CATARACT EXTRACTION W/ INTRAOCULAR LENS  IMPLANT, BILATERAL  2018  . COLONOSCOPY W/ BIOPSIES AND POLYPECTOMY    . COLONOSCOPY WITH PROPOFOL N/A 05/16/2016   Procedure: COLONOSCOPY WITH PROPOFOL;  Surgeon: PCarol Ada MD;  Location: [facility];  Service: Endoscopy;  Laterality: N/A;  . HYDROCELE EXCISION Bilateral 11/15/2018   Procedure: HYDROCELECTOMY ADULT;  Surgeon: OKathie Rhodes MD;  [facility]  Service: Urology;  Laterality: Bilateral;  . KNEE ARTHROSCOPY Right 2009  . MULTIPLE TOOTH EXTRACTIONS    . ORIF ANKLE FRACTURE Left 09/13/2014   Procedure: Open Reduction Internal Fixation Left Ankle Medial Malleolus;  Surgeon: MNewt Minion MD;  [place]  Service: Orthopedics;  Laterality: Left;  . PROSTATECTOMY  1993 approx.  by dr dDiona Fanti  for prostate enlargement  . TOTAL KNEE ARTHROPLASTY Right 08/27/2009   dr wNoemi Chapel_1   . TOTAL KNEE ARTHROPLASTY  12/20/2012   Procedure: TOTAL KNEE ARTHROPLASTY;  Surgeon: RLorn Junes MD;  [place]  Service: Orthopedics;  Laterality: Left;  left total knee arthroplasty  .  TRANSTHORACIC ECHOCARDIOGRAM  11-04-2018   dr Jina Olenick   moderate to severe concentric LVH, ef 56%,  grade 1 diastolic dysfunciton/  mild AV calcification annulus and leaflets without stentosis ,  mild regurg./  moderate LAE/ dilated aortic root, 4.5cm/  mild MV annulus calcification with trace regurg./  trace TR     Social History   Socioeconomic History  . Marital status: Married    Spouse name: Not on file  . Number of children: 5  . Years of education: Not on file  . Highest education level: Not on file  Occupational History  . Not on file  Social Needs  . Financial  resource strain: Not on file  . Food insecurity    Worry: Not on file    Inability: Not on file  . Transportation needs    Medical: Not on file    Non-medical: Not on file  Tobacco Use  . Smoking status: Former Smoker    Packs/day: 1.50    Years: 10.00    Pack years: 15.00    Types: Cigarettes    Quit date: 12/14/1971    Years since quitting: 47.8  . Smokeless tobacco: Former Systems developer    Types: Snuff, Chew  Substance and Sexual Activity  . Alcohol use: Not Currently    Alcohol/week: 1.0 standard drinks    Types: 1 Standard drinks or equivalent per week    Comment: one shot qhs  . Drug use: No  . Sexual activity: Not on file  Lifestyle  . Physical activity    Days per week: Not on file    Minutes per session: Not on file  . Stress: Not on file  Relationships  . Social Herbalist on phone: Not on file    Gets together: Not on file    Attends religious service: Not on file    Active member of club or organization: Not on file    Attends meetings of clubs or organizations: Not on file    Relationship status: Not on file  . Intimate partner violence    Fear of current or ex partner: Not on file    Emotionally abused: Not on file    Physically abused: Not on file    Forced sexual activity: Not on file  Other Topics Concern  . Not on file  Social History Narrative  . Not on file     Current Outpatient Medications on File Prior to Visit  Medication Sig Dispense Refill  . aspirin EC 81 MG tablet Take 81 mg by mouth daily.    Marland Kitchen BIDIL 20-37.5 MG tablet TAKE TWO TABLETS BY MOUTH THREE TIMES A DAY  540 tablet 1  . carvedilol (COREG) 25 MG tablet Take 25 mg by mouth 2 (two) times daily with a meal.     . clobetasol ointment (TEMOVATE) 5.88 % Apply 1 application topically daily as needed (for itching).     . Cyanocobalamin 2500 MCG TABS Take 2,500 mcg by mouth daily.    . diclofenac sodium (VOLTAREN) 1 % GEL Apply 1 application topically 4 (four) times daily as needed  (For pain.).    Marland Kitchen docusate sodium 100 MG CAPS 1 tab 2 times a day while on narcotics.  STOOL SOFTENER (Patient taking differently: Take 1 capsule by mouth every evening. 1 tab 2 times a day while on narcotics.  STOOL SOFTENER) 60 capsule 0  . Dulaglutide (TRULICITY) 1.5 FO/2.7XA SOPN Inject 1 Dose into the skin once a week.  Wednesday's    . felodipine (PLENDIL) 5 MG 24 hr tablet Take 10 mg by mouth every morning.     . furosemide (LASIX) 20 MG tablet TAKE ONE TABLET BY MOUTH ONE TIME DAILY *take 1 tablet twice daily for 3 to 5 days if increase leg swelling or shortness of breath occurs* 90 tablet 0  . furosemide (LASIX) 40 MG tablet Take 40 mg by mouth every morning.     Marland Kitchen HYDROcodone-acetaminophen (NORCO/VICODIN) 5-325 MG tablet 2 (two) times daily.    Marland Kitchen loratadine (CLARITIN) 10 MG tablet Take 10 mg by mouth every morning.     Marland Kitchen losartan (COZAAR) 100 MG tablet Take 100 mg by mouth every evening.     Marland Kitchen MEGARED OMEGA-3 KRILL OIL PO Take 1 tablet by mouth daily.    . metFORMIN (GLUCOPHAGE) 1000 MG tablet Take 1,000 mg by mouth 2 (two) times daily with a meal.    . Multiple Vitamin (MULTIVITAMIN WITH MINERALS) TABS tablet Take 1 tablet by mouth daily.    Glory Rosebush VERIO test strip USE TO TEST BID AS DIRECTED  2  . Polyethyl Glycol-Propyl Glycol (SYSTANE OP) Apply to eye as needed.    . potassium chloride SA (K-DUR) 20 MEQ tablet     . simvastatin (ZOCOR) 40 MG tablet Take 40 mg by mouth every evening.    Marland Kitchen spironolactone (ALDACTONE) 50 MG tablet Take 1 tablet by mouth daily.    . Turmeric 400 MG CAPS Take by mouth 2 (two) times daily.    . urea (CARMOL) 10 % cream Apply topically as needed.     No current facility-administered medications on file prior to visit.     Cardiovascular studies:  Echocardiogram 09/19/2019: Left ventricle cavity is normal in size. Moderate concentric hypertrophy of the left ventricle. Normal LV systolic function with visual EF 50-55%. Normal global wall motion.  Doppler evidence of grade I (impaired) diastolic dysfunction, normal LAP.  Left atrial cavity is moderately dilated. Mild (Grade I) aortic regurgitation. Mild (Grade I) mitral regurgitation. 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 Estimated RA pressure 10-15 mmHg. Compared to previous study on 11/04/2018, aortic root size increased from 4.5 to 4.7 cm.  Abdominal Aortic Duplex  09/19/2019: Moderate dilatation of the abdominal aorta is noted in the mid and distal aorta. Mild plaque observed in the mid and distal aorta. Normal flow velocities noted in the abdominal and interal iliac arteries.   An abdominal aortic aneurysm measuring 3.41 x 3.4 x 3.33 cm is seen.  Moderate mostly soft plaque noted in the mid and distal aorta.  Compared to 01/24/2019, there is mild progression, previously 3.1 cm in size, maximum size 3.42 cm.  In the distal abdominal aorta. Recheck in 1 year for stability.   Echocardiogram 11/04/2018: 1. Left ventricle cavity is normal in size. Moderate to severe concentric hypertrophy of the left ventricle. Normal global wall motion. Doppler evidence of grade I (impaired) diastolic dysfunction, elevated LAP. Calculated EF 56%. 2. Left atrial cavity is moderately dilated. 3. Mild (Grade I) aortic regurgitation. Mild calcification of the aortic valve annulus. Mild aortic valve leaflet calcification. 4. Trace mitral regurgitation. Mild calcification of the mitral valve annulus. 5. Trace tricuspid regurgitation. 6. The aortic root is dilated, measures 4.5 cm, no significant change in size c.f. echo. of 10/15/2017.  EKG 05/07/2018: Sinus rhythm 75 bpm. Normal axis. Normal conduction. Poor R-wave progression.   Recent labs:  08/27/2018: Glucose 110. BUN/Cr  13/1.4 eGFR 49/59. Na/K 140/4.1 BNP 12  04/13/2018: H/H 13/44. MCV 88. Platelets 161 Glucose 93. BUN/creatinine 11/1.3. EGFR 53/64. Sodium 138, potassium 4.5 Chol 135, TG 78,  HDL 49 LDL 70 Apolipoprotein B 60 Normal TSH 2/1 normal   Review of Systems  Constitution: Negative for decreased appetite, malaise/fatigue, weight gain and weight loss.  HENT: Negative for congestion.   Eyes: Negative for visual disturbance.  Cardiovascular: Positive for dyspnea on exertion and leg swelling. Negative for chest pain, palpitations and syncope.  Respiratory: Negative for shortness of breath.   Endocrine: Negative for cold intolerance.  Hematologic/Lymphatic: Does not bruise/bleed easily.  Skin: Negative for itching and rash.  Musculoskeletal: Negative for myalgias.  Gastrointestinal: Negative for abdominal pain, nausea and vomiting.  Genitourinary: Negative for dysuria.  Neurological: Negative for dizziness and weakness.  Psychiatric/Behavioral: The patient is not nervous/anxious.   All other systems reviewed and are negative.      Objective:   *** There were no vitals filed for this visit.   Physical Exam  Constitutional: He appears well-developed. No distress.  HENT:  Head: Atraumatic.  Eyes: Conjunctivae are normal.  Neck: Neck supple. No JVD present. No thyromegaly present.  Cardiovascular: Normal rate, regular rhythm and normal heart sounds. Exam reveals no gallop.  No murmur heard. Pulses:      Carotid pulses are 2+ on the right side and 2+ on the left side.      Dorsalis pedis pulses are 2+ on the right side and 2+ on the left side.       Posterior tibial pulses are 2+ on the right side and 2+ on the left side.  Femoral and popliteal pulse difficult to feel due to patient's body habitus.   Pulmonary/Chest: Effort normal and breath sounds normal.  Abdominal: Soft. Bowel sounds are normal.  Musculoskeletal: Normal range of motion.        General: No edema.  Neurological: He is alert.  Skin: Skin is warm and dry.  Psychiatric: He has a normal mood and affect.        Assessment & Recommendations:    78 year old African-American male with  uncontrolled hypertension, uncontrolled type II diabetes mellitus, CKD stage 2/3, OSA on CPAP, heart failure with preserved ejection fraction, here for f/u.  Hypertension: Fairly well conrolled on current antihypertensive therapy. Given he is on Bidil, I have asked him to avoid using sildenafil. If he must use this, avoid taking bidil for 48 hrs on either side of sildenafil use. In future, if he has improvement in his blood pressure with weight loss, may consider weaning him off Bidil.   Dilated aortic root: Stable at 4.5-4.6 cm on repeat echocardiograms in 2014, 2017, and 2018. Repeat echocardiogram in 09/2019.  Mild dilated abdominal aorta 3.5 cm. Repeat US in 09/2019.  If stable, would then repeat the above studies in 2023, as these are normal for his body habitus.   I will see him back in 6 months   Clova Morlock Esther Hardy, MD [facility] Pager: [phone] Office: [phone] If no answer Cell [phone]

## 2019-09-22 NOTE — Progress Notes (Signed)
I think he is yours and seeing you soon

## 2019-09-24 DIAGNOSIS — I714 Abdominal aortic aneurysm, without rupture, unspecified: Secondary | ICD-10-CM | POA: Insufficient documentation

## 2019-10-05 ENCOUNTER — Ambulatory Visit: Payer: Medicare HMO | Admitting: Cardiology

## 2019-10-13 ENCOUNTER — Telehealth: Payer: Self-pay

## 2019-10-13 ENCOUNTER — Encounter: Payer: Self-pay | Admitting: Cardiology

## 2019-10-13 ENCOUNTER — Ambulatory Visit: Payer: Medicare HMO | Admitting: Cardiology

## 2019-10-13 ENCOUNTER — Other Ambulatory Visit: Payer: Self-pay

## 2019-10-13 VITALS — BP 149/84 | HR 67 | Temp 97.4°F | Ht 74.0 in | Wt 320.0 lb

## 2019-10-13 DIAGNOSIS — I714 Abdominal aortic aneurysm, without rupture, unspecified: Secondary | ICD-10-CM

## 2019-10-13 DIAGNOSIS — I712 Thoracic aortic aneurysm, without rupture: Secondary | ICD-10-CM | POA: Diagnosis not present

## 2019-10-13 DIAGNOSIS — I7121 Aneurysm of the ascending aorta, without rupture: Secondary | ICD-10-CM

## 2019-10-13 DIAGNOSIS — I10 Essential (primary) hypertension: Secondary | ICD-10-CM

## 2019-10-13 MED ORDER — AMLODIPINE BESYLATE 5 MG PO TABS: 5.0000 mg | ORAL_TABLET | Freq: Every day | ORAL | 3 refills | Status: DC

## 2019-10-13 NOTE — Progress Notes (Signed)
Patient is here for follow up visit.  Subjective:   '@Patient'  ID: Bradley Alvarez, male    DOB: 1941-07-19, 78 y.o.   MRN: [id number]  Chief Complaint  Patient presents with  . Hypertension  . Follow-up  . Results     HPI  78 year old African-American male with uncontrolled hypertension, uncontrolled type II diabetes mellitus, CKD stage 2/3, OSA on CPAP, heart failure with preserved ejection fraction, here for f/u.  Patient has not been compliant with diet in recent past.  He has gained a few pounds since his last visit.  Denies any chest pain, negative has resolved.  He has stable mild exertional dyspnea.  Recent echocardiogram and abdominal aorta ultrasound results discussed with the patient.     Past Medical History:  Diagnosis Date  . AAA (abdominal aortic aneurysm) without rupture [facility])    followed by dr Derek Jack--  per office note dated 11-05-2018  3.5cm  . Benign essential HTN    cardiologist-- dr Margart Sickles  . Chronic combined systolic (congestive) and diastolic (congestive) heart failure [facility])    cardiologist-- dr Robb Matar  . CKD (chronic kidney disease), stage III ([place])   . Diabetes mellitus type 2, insulin dependent ([place])    followed by dr Virgina Jock  . Diabetic neuropathy ([place])   . DOE (dyspnea on exertion)   . Edema of both lower extremities   . Full dentures   . HOH (hard of hearing)    does wear hearing aids  . Hydrocele, bilateral   . Lichen planus   . Mixed hyperlipidemia   . Morbid obesity with BMI of 45.0-49.9, adult ([place])   . Nocturia   . OA (osteoarthritis)   . OSA on CPAP 10/14/08   per study  AHI  61.8/hr ,  RDI  96.1/hr.      Past Surgical History:  Procedure Laterality Date  . CARDIAC CATHETERIZATION  per pt yrs ago   was told no blockage  . CARDIOVASCULAR STRESS TEST  07-19-2014   dr Shelva Majestic   Low risk nuclear study w/ small apical attentuation artifact/  normal LV function and wall motion , ef 53%  . CATARACT EXTRACTION W/  INTRAOCULAR LENS  IMPLANT, BILATERAL  2018  . COLONOSCOPY W/ BIOPSIES AND POLYPECTOMY    . COLONOSCOPY WITH PROPOFOL N/A 05/16/2016   Procedure: COLONOSCOPY WITH PROPOFOL;  Surgeon: Carol Ada, MD;  Location: [facility];  Service: Endoscopy;  Laterality: N/A;  . HYDROCELE EXCISION Bilateral 11/15/2018   Procedure: HYDROCELECTOMY ADULT;  Surgeon: Kathie Rhodes, MD;  Location[facility];  Service: Urology;  Laterality: Bilateral;  . KNEE ARTHROSCOPY Right 2009  . MULTIPLE TOOTH EXTRACTIONS    . ORIF ANKLE FRACTURE Left 09/13/2014   Procedure: Open Reduction Internal Fixation Left Ankle Medial Malleolus;  Surgeon: Newt Minion, MD;  Location: [place];  Service: Orthopedics;  Laterality: Left;  . PROSTATECTOMY  1993 approx.  by dr Diona Fanti   for prostate enlargement  . TOTAL KNEE ARTHROPLASTY Right 08/27/2009   dr Noemi Chapel '@[facility]'   . TOTAL KNEE ARTHROPLASTY  12/20/2012   Procedure: TOTAL KNEE ARTHROPLASTY;  Surgeon: Lorn Junes, MD;  Location: [place];  Service: Orthopedics;  Laterality: Left;  left total knee arthroplasty  . TRANSTHORACIC ECHOCARDIOGRAM  11-04-2018   dr Irelynd Zumstein   moderate to severe concentric LVH, ef 56%,  grade 1 diastolic dysfunciton/  mild AV calcification annulus and leaflets without stentosis ,  mild regurg./  moderate LAE/ dilated aortic root, 4.5cm/  mild MV annulus calcification with trace regurg./  trace TR     Social History   Socioeconomic History  . Marital status: Married    Spouse name: Not on file  . Number of children: 5  . Years of education: Not on file  . Highest education level: Not on file  Occupational History  . Not on file  Social Needs  . Financial resource strain: Not on file  . Food insecurity    Worry: Not on file    Inability: Not on file  . Transportation needs    Medical: Not on file    Non-medical: Not on file  Tobacco Use  . Smoking status: Former Smoker    Packs/day: 1.50    Years: 10.00    Pack years: 15.00     Types: Cigarettes    Quit date: 12/14/1971    Years since quitting: 47.8  . Smokeless tobacco: Former Systems developer    Types: Snuff, Chew  Substance and Sexual Activity  . Alcohol use: Not Currently    Alcohol/week: 1.0 standard drinks    Types: 1 Standard drinks or equivalent per week    Comment: one shot qhs  . Drug use: No  . Sexual activity: Not on file  Lifestyle  . Physical activity    Days per week: Not on file    Minutes per session: Not on file  . Stress: Not on file  Relationships  . Social Herbalist on phone: Not on file    Gets together: Not on file    Attends religious service: Not on file    Active member of club or organization: Not on file    Attends meetings of clubs or organizations: Not on file    Relationship status: Not on file  . Intimate partner violence    Fear of current or ex partner: Not on file    Emotionally abused: Not on file    Physically abused: Not on file    Forced sexual activity: Not on file  Other Topics Concern  . Not on file  Social History Narrative  . Not on file     Current Outpatient Medications on File Prior to Visit  Medication Sig Dispense Refill  . aspirin EC 81 MG tablet Take 81 mg by mouth daily.    Marland Kitchen BIDIL 20-37.5 MG tablet TAKE TWO TABLETS BY MOUTH THREE TIMES A DAY  540 tablet 1  . carvedilol (COREG) 25 MG tablet Take 25 mg by mouth 2 (two) times daily with a meal.     . clobetasol ointment (TEMOVATE) 6.01 % Apply 1 application topically daily as needed (for itching).     . Cyanocobalamin 2500 MCG TABS Take 500 mcg by mouth daily.     . diclofenac sodium (VOLTAREN) 1 % GEL Apply 1 application topically 4 (four) times daily as needed (For pain.).    Marland Kitchen Dulaglutide (TRULICITY) 1.5 UX/3.2TF SOPN Inject 1 Dose into the skin once a week. Wednesday's    . felodipine (PLENDIL) 5 MG 24 hr tablet Take 5 mg by mouth 2 (two) times daily.     . furosemide (LASIX) 20 MG tablet TAKE ONE TABLET BY MOUTH ONE TIME DAILY *take 1  tablet twice daily for 3 to 5 days if increase leg swelling or shortness of breath occurs* 90 tablet 0  . HYDROcodone-acetaminophen (NORCO/VICODIN) 5-325 MG tablet 2 (two) times daily.    Marland Kitchen loratadine (CLARITIN) 10 MG tablet Take 10 mg by mouth  every morning.     Marland Kitchen losartan (COZAAR) 100 MG tablet Take 100 mg by mouth every evening.     Marland Kitchen MEGARED OMEGA-3 KRILL OIL PO Take 1 tablet by mouth daily.    . metFORMIN (GLUCOPHAGE) 1000 MG tablet Take 1,000 mg by mouth 2 (two) times daily with a meal.    . Multiple Vitamin (MULTIVITAMIN WITH MINERALS) TABS tablet Take 1 tablet by mouth daily.    Glory Rosebush VERIO test strip USE TO TEST BID AS DIRECTED  2  . Polyethyl Glycol-Propyl Glycol (SYSTANE OP) Apply to eye as needed.    . potassium chloride SA (K-DUR) 20 MEQ tablet     . simvastatin (ZOCOR) 40 MG tablet Take 40 mg by mouth every evening.    Marland Kitchen spironolactone (ALDACTONE) 50 MG tablet Take 1 tablet by mouth daily.    . Turmeric 400 MG CAPS Take by mouth 2 (two) times daily.    . urea (CARMOL) 10 % cream Apply topically as needed.     No current facility-administered medications on file prior to visit.     Cardiovascular studies:  EKG 10/13/2019: Sinus rhythm 66 bpm.  Left anterior fascicular block. Poor R-wave progression.  Echocardiogram 09/19/2019: Left ventricle cavity is normal in size. Moderate concentric hypertrophy of the left ventricle. Normal LV systolic function with visual EF 50-55%. Normal global wall motion. Doppler evidence of grade I (impaired) diastolic dysfunction, normal LAP.  Left atrial cavity is moderately dilated. Mild (Grade I) aortic regurgitation. Mild (Grade I) mitral regurgitation. 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 Estimated RA pressure 10-15 mmHg. Compared to previous study on 11/04/2018, aortic root size increased from 4.5 to 4.7 cm.  Abdominal Aortic Duplex  09/19/2019: Moderate dilatation of  the abdominal aorta is noted in the mid and distal aorta. Mild plaque observed in the mid and distal aorta. Normal flow velocities noted in the abdominal and interal iliac arteries.   An abdominal aortic aneurysm measuring 3.41 x 3.4 x 3.33 cm is seen.  Moderate mostly soft plaque noted in the mid and distal aorta.  Compared to 01/24/2019, there is mild progression, previously 3.1 cm in size, maximum size 3.42 cm.  In the distal abdominal aorta. Recheck in 1 year for stability.   EKG 05/07/2018: Sinus rhythm 75 bpm. Normal axis. Normal conduction. Poor R-wave progression.   Recent labs: 08/27/2018: Glucose 110. BUN/Cr 13/1.4 eGFR 49/59. Na/K 140/4.1 BNP 12  04/13/2018: H/H 13/44. MCV 88. Platelets 161 Glucose 93. BUN/creatinine 11/1.3. EGFR 53/64. Sodium 138, potassium 4.5 Chol 135, TG 78, HDL 49 LDL 70 Apolipoprotein B 60 Normal TSH 2/1 normal   Review of Systems  Constitution: Negative for decreased appetite, malaise/fatigue, weight gain and weight loss.  HENT: Negative for congestion.   Eyes: Negative for visual disturbance.  Cardiovascular: Positive for dyspnea on exertion (Mild, stable). Negative for chest pain, leg swelling, palpitations and syncope.  Respiratory: Negative for shortness of breath.   Endocrine: Negative for cold intolerance.  Hematologic/Lymphatic: Does not bruise/bleed easily.  Skin: Negative for itching and rash.  Musculoskeletal: Negative for myalgias.  Gastrointestinal: Negative for abdominal pain, nausea and vomiting.  Genitourinary: Negative for dysuria.  Neurological: Negative for dizziness and weakness.  Psychiatric/Behavioral: The patient is not nervous/anxious.   All other systems reviewed and are negative.      Objective:    Vitals:   10/13/19 0955  BP: (!) 149/84  Pulse: 67  Temp: (!) 97.4 F (  36.3 C)  SpO2: 94%     Physical Exam  Constitutional: He is oriented to person, place, and time. He appears well-developed and well-nourished.  No distress.  HENT:  Head: Normocephalic and atraumatic.  Eyes: Pupils are equal, round, and reactive to light. Conjunctivae are normal.  Neck: Neck supple. No JVD present. No thyromegaly present.  Cardiovascular: Normal rate, regular rhythm, normal heart sounds and intact distal pulses. Exam reveals no gallop.  No murmur heard. Pulses:      Carotid pulses are 2+ on the right side and 2+ on the left side.      Dorsalis pedis pulses are 2+ on the right side and 2+ on the left side.       Posterior tibial pulses are 2+ on the right side and 2+ on the left side.  Femoral and popliteal pulse difficult to feel due to patient's body habitus.   Pulmonary/Chest: Effort normal and breath sounds normal. He has no wheezes. He has no rales.  Abdominal: Soft. Bowel sounds are normal. There is no rebound.  Musculoskeletal: Normal range of motion.        General: No edema.  Lymphadenopathy:    He has no cervical adenopathy.  Neurological: He is alert and oriented to person, place, and time. No cranial nerve deficit.  Skin: Skin is warm and dry.  Psychiatric: He has a normal mood and affect.  Nursing note and vitals reviewed.       Assessment & Recommendations:    78 year old African-American male with uncontrolled hypertension, uncontrolled type II diabetes mellitus, CKD stage 2/3, OSA on CPAP, heart failure with preserved ejection fraction, here for f/u.  Hypertension: Suboptimal control.  Given his ascending aorta and abdominal aorta aneurysm, it is important to be controlled his heart rate and blood pressure.  Increase Carvedilol to 25 mg tid.  Continue rest of the antihypertensive therapy, including BiDil 20-37.5 mg 2 tablets 3 times daily, felodipine 5 mg bid, losartan 100 mg daily.   Dilated aortic root: Compared to previous study on 11/04/2018, aortic root size increased from 4.5 to 4.7 cm. Stable at 4.5-4.6 cm on repeat echocardiograms in 2014, 2017, and 2018.  I will check BMP.  If  favorable, would proceed with CT angiogram chest and abdomen.  If creatinine is elevated, will continue monitoring with echocardiogram, with repeat echocardiogram in 6 months.  AAA: An abdominal aortic aneurysm measuring 3.41 x 3.4 x 3.33 cm is seen.  Moderate mostly soft plaque noted in the mid and distal aorta with mild increase in size compared to 01/2019.  Nigel Mormon, MD [facility] [place] Pager: [phone] Office: [phone] If no answer Cell [phone]

## 2019-10-13 NOTE — Telephone Encounter (Signed)
Called pt wife answered, informed her to not pick up the amlodipine and to increase the carvedilol 25 to TIB. Wife understood.

## 2019-11-04 ENCOUNTER — Encounter: Payer: Self-pay | Admitting: Podiatry

## 2019-11-04 ENCOUNTER — Other Ambulatory Visit: Payer: Self-pay

## 2019-11-04 ENCOUNTER — Ambulatory Visit: Payer: Medicare HMO | Admitting: Podiatry

## 2019-11-04 DIAGNOSIS — M79674 Pain in right toe(s): Secondary | ICD-10-CM | POA: Diagnosis not present

## 2019-11-04 DIAGNOSIS — E119 Type 2 diabetes mellitus without complications: Secondary | ICD-10-CM | POA: Diagnosis not present

## 2019-11-04 DIAGNOSIS — M2140 Flat foot [pes planus] (acquired), unspecified foot: Secondary | ICD-10-CM | POA: Diagnosis not present

## 2019-11-04 DIAGNOSIS — M79675 Pain in left toe(s): Secondary | ICD-10-CM | POA: Diagnosis not present

## 2019-11-04 DIAGNOSIS — B351 Tinea unguium: Secondary | ICD-10-CM | POA: Diagnosis not present

## 2019-11-04 NOTE — Progress Notes (Signed)
Patient ID: Bradley Alvarez, male   DOB: 01-Jun-1941, 78 y.o.   MRN: [id number] Complaint:  Visit Type: Patient returns to my office for continued preventative foot care services. Complaint: Patient states" my nails have grown long and thick and become painful to walk and wear shoes" Patient has been diagnosed with DM with no complications. He presents for preventative foot care services.   Podiatric Exam: Vascular: dorsalis pedis and posterior tibial pulses are palpable bilateral. Capillary return is immediate. Temperature gradient is WNL. Skin turgor WNL  Sensorium: Normal Semmes Weinstein monofilament test. Normal tactile sensation bilaterally. Nail Exam: Pt has thick disfigured discolored nails with subungual debris noted bilateral entire nail hallux through fifth toenails Ulcer Exam: There is no evidence of ulcer or pre-ulcerative changes or infection. Orthopedic Exam: Muscle tone and strength are WNL. No limitations in general ROM. No crepitus or effusions noted. PTT tendinitis/arthritis left foot.  HAV  B/L.  Pes planus B/L Skin: No Porokeratosis. No infection or ulcers  Diagnosis:  Tinea unguium, Pain in right toe, pain in left toes,  HAV  B/L  PTT tendinitis  Pes planus  Treatment & Plan Procedures and Treatment: Consent by patient was obtained for treatment procedures. The patient understood the discussion of treatment and procedures well. All questions were answered thoroughly reviewed. Debridement of mycotic and hypertrophic toenails, 1 through 5 bilateral and clearing of subungual debris. No ulceration, no infection noted.  Return Visit-Office Procedure: Patient instructed to return to the office for a follow up visit 3 months for continued evaluation and treatment.   Gardiner Barefoot DPM

## 2019-12-01 DIAGNOSIS — R69 Illness, unspecified: Secondary | ICD-10-CM | POA: Diagnosis not present

## 2019-12-20 ENCOUNTER — Other Ambulatory Visit: Payer: Self-pay | Admitting: Cardiology

## 2019-12-29 DIAGNOSIS — N1831 Chronic kidney disease, stage 3a: Secondary | ICD-10-CM | POA: Diagnosis not present

## 2019-12-29 DIAGNOSIS — I714 Abdominal aortic aneurysm, without rupture: Secondary | ICD-10-CM | POA: Diagnosis not present

## 2019-12-29 DIAGNOSIS — I5042 Chronic combined systolic (congestive) and diastolic (congestive) heart failure: Secondary | ICD-10-CM | POA: Diagnosis not present

## 2019-12-29 DIAGNOSIS — I130 Hypertensive heart and chronic kidney disease with heart failure and stage 1 through stage 4 chronic kidney disease, or unspecified chronic kidney disease: Secondary | ICD-10-CM | POA: Diagnosis not present

## 2019-12-29 DIAGNOSIS — E1129 Type 2 diabetes mellitus with other diabetic kidney complication: Secondary | ICD-10-CM | POA: Diagnosis not present

## 2019-12-29 DIAGNOSIS — I429 Cardiomyopathy, unspecified: Secondary | ICD-10-CM | POA: Diagnosis not present

## 2019-12-29 DIAGNOSIS — I712 Thoracic aortic aneurysm, without rupture: Secondary | ICD-10-CM | POA: Diagnosis not present

## 2019-12-29 DIAGNOSIS — D696 Thrombocytopenia, unspecified: Secondary | ICD-10-CM | POA: Diagnosis not present

## 2019-12-29 DIAGNOSIS — G4733 Obstructive sleep apnea (adult) (pediatric): Secondary | ICD-10-CM | POA: Diagnosis not present

## 2020-01-02 DIAGNOSIS — E1129 Type 2 diabetes mellitus with other diabetic kidney complication: Secondary | ICD-10-CM | POA: Diagnosis not present

## 2020-01-10 DIAGNOSIS — H40013 Open angle with borderline findings, low risk, bilateral: Secondary | ICD-10-CM | POA: Diagnosis not present

## 2020-01-10 DIAGNOSIS — E119 Type 2 diabetes mellitus without complications: Secondary | ICD-10-CM | POA: Diagnosis not present

## 2020-01-10 DIAGNOSIS — H35052 Retinal neovascularization, unspecified, left eye: Secondary | ICD-10-CM | POA: Diagnosis not present

## 2020-01-10 DIAGNOSIS — Z961 Presence of intraocular lens: Secondary | ICD-10-CM | POA: Diagnosis not present

## 2020-01-18 DIAGNOSIS — H3562 Retinal hemorrhage, left eye: Secondary | ICD-10-CM | POA: Diagnosis not present

## 2020-01-18 DIAGNOSIS — H353221 Exudative age-related macular degeneration, left eye, with active choroidal neovascularization: Secondary | ICD-10-CM | POA: Diagnosis not present

## 2020-01-18 DIAGNOSIS — H35052 Retinal neovascularization, unspecified, left eye: Secondary | ICD-10-CM | POA: Diagnosis not present

## 2020-01-18 DIAGNOSIS — H35371 Puckering of macula, right eye: Secondary | ICD-10-CM | POA: Diagnosis not present

## 2020-01-20 DIAGNOSIS — R69 Illness, unspecified: Secondary | ICD-10-CM | POA: Diagnosis not present

## 2020-01-25 DIAGNOSIS — H353221 Exudative age-related macular degeneration, left eye, with active choroidal neovascularization: Secondary | ICD-10-CM | POA: Diagnosis not present

## 2020-02-03 ENCOUNTER — Other Ambulatory Visit: Payer: Self-pay

## 2020-02-03 ENCOUNTER — Ambulatory Visit: Payer: Medicare HMO | Admitting: Podiatry

## 2020-02-03 ENCOUNTER — Encounter: Payer: Self-pay | Admitting: Podiatry

## 2020-02-03 VITALS — Temp 97.7°F

## 2020-02-03 DIAGNOSIS — M2140 Flat foot [pes planus] (acquired), unspecified foot: Secondary | ICD-10-CM

## 2020-02-03 DIAGNOSIS — B351 Tinea unguium: Secondary | ICD-10-CM

## 2020-02-03 DIAGNOSIS — M2010 Hallux valgus (acquired), unspecified foot: Secondary | ICD-10-CM

## 2020-02-03 DIAGNOSIS — M79675 Pain in left toe(s): Secondary | ICD-10-CM

## 2020-02-03 DIAGNOSIS — M79674 Pain in right toe(s): Secondary | ICD-10-CM

## 2020-02-03 DIAGNOSIS — E119 Type 2 diabetes mellitus without complications: Secondary | ICD-10-CM | POA: Diagnosis not present

## 2020-02-03 NOTE — Progress Notes (Signed)
Patient ID: Bradley Alvarez, male   DOB: December 17, 1940, 79 y.o.   MRN: [id number] Complaint:  Visit Type: Patient returns to my office for continued preventative foot care services. Complaint: Patient states" my nails have grown long and thick and become painful to walk and wear shoes" Patient has been diagnosed with DM with no complications. He presents for preventative foot care services.   Podiatric Exam: Vascular: dorsalis pedis and posterior tibial pulses are  Weakly  palpable bilateral. Capillary return is immediate. Temperature gradient is WNL. Skin turgor WNL  Sensorium: Normal Semmes Weinstein monofilament test. Normal tactile sensation bilaterally. Nail Exam: Pt has thick disfigured discolored nails with subungual debris noted bilateral entire nail hallux through fifth toenails Ulcer Exam: There is no evidence of ulcer or pre-ulcerative changes or infection. Orthopedic Exam: Muscle tone and strength are WNL. No limitations in general ROM. No crepitus or effusions noted. PTT tendinitis/arthritis left foot.  HAV  B/L.  Pes planus B/L Skin: No Porokeratosis. No infection or ulcers  Diagnosis:  Tinea unguium, Pain in right toe, pain in left toes,  HAV  B/L  PTT tendinitis  Pes planus  Treatment & Plan Procedures and Treatment: Consent by patient was obtained for treatment procedures. The patient understood the discussion of treatment and procedures well. All questions were answered thoroughly reviewed. Debridement of mycotic and hypertrophic toenails, 1 through 5 bilateral and clearing of subungual debris. No ulceration, no infection noted.  Return Visit-Office Procedure: Patient instructed to return to the office for a follow up visit 3 months for continued evaluation and treatment.   Gardiner Barefoot DPM

## 2020-02-27 DIAGNOSIS — H35371 Puckering of macula, right eye: Secondary | ICD-10-CM | POA: Diagnosis not present

## 2020-02-27 DIAGNOSIS — H353221 Exudative age-related macular degeneration, left eye, with active choroidal neovascularization: Secondary | ICD-10-CM | POA: Diagnosis not present

## 2020-02-27 DIAGNOSIS — H3562 Retinal hemorrhage, left eye: Secondary | ICD-10-CM | POA: Diagnosis not present

## 2020-02-27 DIAGNOSIS — H35052 Retinal neovascularization, unspecified, left eye: Secondary | ICD-10-CM | POA: Diagnosis not present

## 2020-03-07 DIAGNOSIS — R69 Illness, unspecified: Secondary | ICD-10-CM | POA: Diagnosis not present

## 2020-03-28 DIAGNOSIS — H353111 Nonexudative age-related macular degeneration, right eye, early dry stage: Secondary | ICD-10-CM | POA: Insufficient documentation

## 2020-03-28 DIAGNOSIS — H3562 Retinal hemorrhage, left eye: Secondary | ICD-10-CM | POA: Insufficient documentation

## 2020-03-28 DIAGNOSIS — H35052 Retinal neovascularization, unspecified, left eye: Secondary | ICD-10-CM | POA: Insufficient documentation

## 2020-03-28 DIAGNOSIS — H35371 Puckering of macula, right eye: Secondary | ICD-10-CM | POA: Insufficient documentation

## 2020-03-28 DIAGNOSIS — H353221 Exudative age-related macular degeneration, left eye, with active choroidal neovascularization: Secondary | ICD-10-CM | POA: Insufficient documentation

## 2020-03-28 DIAGNOSIS — Z961 Presence of intraocular lens: Secondary | ICD-10-CM | POA: Insufficient documentation

## 2020-03-29 ENCOUNTER — Encounter (INDEPENDENT_AMBULATORY_CARE_PROVIDER_SITE_OTHER): Payer: Self-pay | Admitting: Ophthalmology

## 2020-03-29 ENCOUNTER — Other Ambulatory Visit: Payer: Self-pay

## 2020-03-29 ENCOUNTER — Ambulatory Visit (INDEPENDENT_AMBULATORY_CARE_PROVIDER_SITE_OTHER): Payer: Medicare HMO | Admitting: Ophthalmology

## 2020-03-29 DIAGNOSIS — H353111 Nonexudative age-related macular degeneration, right eye, early dry stage: Secondary | ICD-10-CM | POA: Diagnosis not present

## 2020-03-29 DIAGNOSIS — H353221 Exudative age-related macular degeneration, left eye, with active choroidal neovascularization: Secondary | ICD-10-CM | POA: Diagnosis not present

## 2020-03-29 DIAGNOSIS — Z961 Presence of intraocular lens: Secondary | ICD-10-CM | POA: Diagnosis not present

## 2020-03-29 DIAGNOSIS — H35052 Retinal neovascularization, unspecified, left eye: Secondary | ICD-10-CM | POA: Diagnosis not present

## 2020-03-29 DIAGNOSIS — H35371 Puckering of macula, right eye: Secondary | ICD-10-CM | POA: Diagnosis not present

## 2020-03-29 DIAGNOSIS — H3562 Retinal hemorrhage, left eye: Secondary | ICD-10-CM

## 2020-03-29 MED ORDER — BEVACIZUMAB CHEMO INJECTION 1.25MG/0.05ML SYRINGE FOR KALEIDOSCOPE
1.2500 mg | INTRAVITREAL | Status: AC | PRN
  Administered 2020-03-29: 1.25 mg via INTRAVITREAL

## 2020-03-29 NOTE — Progress Notes (Signed)
03/29/2020     CHIEF COMPLAINT Patient presents for Retina Follow Up   HISTORY OF PRESENT ILLNESS: Bradley Alvarez is a 79 y.o. male who presents to the clinic today for:   HPI    Retina Follow Up    Patient presents with  Wet AMD.  In left eye.  Severity is moderate.  Duration of 4.5 weeks.  Since onset it is gradually improving.  I, the attending physician,  performed the HPI with the patient and updated documentation appropriately.          Comments    4.5 Week AMD f\u OS. Possible Avastin OS. OCT,,  Pt states OS has blurry vision that comes and goes. Pt also seen a floater in OS after inj but it is now starting to go away.       Last edited by Hurman Horn, MD on 03/29/2020 11:28 AM. (History)      Referring physician: [name], MD [place],  [place] [address]  HISTORICAL INFORMATION:   Selected notes from the MEDICAL RECORD NUMBER    Lab Results  Component Value Date   HGBA1C 6.6 (H) 09/10/2017     CURRENT MEDICATIONS: Current Outpatient Medications (Ophthalmic Drugs)  Medication Sig  . Polyethyl Glycol-Propyl Glycol (SYSTANE OP) Apply to eye as needed.   No current facility-administered medications for this visit. (Ophthalmic Drugs)   Current Outpatient Medications (Other)  Medication Sig  . aspirin EC 81 MG tablet Take 81 mg by mouth daily.  Marland Kitchen BIDIL 20-37.5 MG tablet TAKE TWO TABLETS BY MOUTH THREE TIMES A DAY   . carvedilol (COREG) 25 MG tablet Take 25 mg by mouth 2 (two) times daily with a meal.   . clobetasol ointment (TEMOVATE) [id number] % Apply 1 application topically daily as needed (for itching).   . Cyanocobalamin 2500 MCG TABS Take 500 mcg by mouth daily.   . diclofenac sodium (VOLTAREN) 1 % GEL Apply 1 application topically 4 (four) times daily as needed (For pain.).  Marland Kitchen Dulaglutide (TRULICITY) 1.5 0000000 SOPN Inject 1 Dose into the skin once a week. Wednesday's  . felodipine (PLENDIL) 5 MG 24 hr tablet Take 5 mg by mouth 2 (two)  times daily.   . furosemide (LASIX) 20 MG tablet TAKE ONE TABLET BY MOUTH ONE TIME DAILY *take 1 tablet twice daily for 3 to 5 days if increase leg swelling or shortness of breath occurs*  . HYDROcodone-acetaminophen (NORCO/VICODIN) 5-325 MG tablet 2 (two) times daily.  Marland Kitchen loratadine (CLARITIN) 10 MG tablet Take 10 mg by mouth every morning.   Marland Kitchen losartan (COZAAR) 100 MG tablet Take 100 mg by mouth every evening.   Marland Kitchen MEGARED OMEGA-3 KRILL OIL PO Take 1 tablet by mouth daily.  . metFORMIN (GLUCOPHAGE) 1000 MG tablet Take 1,000 mg by mouth 2 (two) times daily with a meal.  . Multiple Vitamin (MULTIVITAMIN WITH MINERALS) TABS tablet Take 1 tablet by mouth daily.  Glory Rosebush VERIO test strip USE TO TEST BID AS DIRECTED  . potassium chloride SA (K-DUR) 20 MEQ tablet   . simvastatin (ZOCOR) 40 MG tablet Take 40 mg by mouth every evening.  Marland Kitchen spironolactone (ALDACTONE) 50 MG tablet Take 1 tablet by mouth daily.  . Turmeric 400 MG CAPS Take by mouth 2 (two) times daily.  . urea (CARMOL) 10 % cream Apply topically as needed.   No current facility-administered medications for this visit. (Other)      REVIEW OF SYSTEMS:  ALLERGIES No Known Allergies  PAST MEDICAL HISTORY Past Medical History:  Diagnosis Date  . AAA (abdominal aortic aneurysm) without rupture [facility])    followed by dr Derek Jack--  per office note dated 11-05-2018  3.5cm  . Benign essential HTN    cardiologist-- dr Margart Sickles  . Chronic combined systolic (congestive) and diastolic (congestive) heart failure [facility])    cardiologist-- dr Robb Matar  . CKD (chronic kidney disease), stage III   . Diabetes mellitus type 2, insulin dependent ([place])    followed by dr Virgina Jock  . Diabetic neuropathy ([place])   . DOE (dyspnea on exertion)   . Edema of both lower extremities   . Full dentures   . HOH (hard of hearing)    does wear hearing aids  . Hydrocele, bilateral   . Lichen planus   . Mixed hyperlipidemia   . Morbid obesity with BMI  of 45.0-49.9, adult ([place])   . Nocturia   . OA (osteoarthritis)   . OSA on CPAP 10/14/08   per study  AHI  61.8/hr ,  RDI  96.1/hr.    Past Surgical History:  Procedure Laterality Date  . CARDIAC CATHETERIZATION  per pt yrs ago   was told no blockage  . CARDIOVASCULAR STRESS TEST  07-19-2014   dr Shelva Majestic   Low risk nuclear study w/ small apical attentuation artifact/  normal LV function and wall motion , ef 53%  . CATARACT EXTRACTION W/ INTRAOCULAR LENS  IMPLANT, BILATERAL  2018  . COLONOSCOPY W/ BIOPSIES AND POLYPECTOMY    . COLONOSCOPY WITH PROPOFOL N/A 05/16/2016   Procedure: COLONOSCOPY WITH PROPOFOL;  Surgeon: Carol Ada, MD;  Location: [facility];  Service: Endoscopy;  Laterality: N/A;  . HYDROCELE EXCISION Bilateral 11/15/2018   Procedure: HYDROCELECTOMY ADULT;  Surgeon: Kathie Rhodes, MD;  Location: [facility];  Service: Urology;  Laterality: Bilateral;  . KNEE ARTHROSCOPY Right 2009  . MULTIPLE TOOTH EXTRACTIONS    . ORIF ANKLE FRACTURE Left 09/13/2014   Procedure: Open Reduction Internal Fixation Left Ankle Medial Malleolus;  Surgeon: Newt Minion, MD;  Location: [place];  Service: Orthopedics;  Laterality: Left;  . PROSTATECTOMY  1993 approx.  by dr Diona Fanti   for prostate enlargement  . TOTAL KNEE ARTHROPLASTY Right 08/27/2009   dr Noemi Chapel @[facility]   . TOTAL KNEE ARTHROPLASTY  12/20/2012   Procedure: TOTAL KNEE ARTHROPLASTY;  Surgeon: Lorn Junes, MD;  Location: [place];  Service: Orthopedics;  Laterality: Left;  left total knee arthroplasty  . TRANSTHORACIC ECHOCARDIOGRAM  11-04-2018   dr patwardhan   moderate to severe concentric LVH, ef 56%,  grade 1 diastolic dysfunciton/  mild AV calcification annulus and leaflets without stentosis ,  mild regurg./  moderate LAE/ dilated aortic root, 4.5cm/  mild MV annulus calcification with trace regurg./  trace TR    FAMILY HISTORY Family History  Problem Relation Age of Onset  . Hypertension Other   .  Diabetes Mellitus II Other   . Stroke Other   . Heart disease Other     SOCIAL HISTORY Social History   Tobacco Use  . Smoking status: Former Smoker    Packs/day: 1.50    Years: 10.00    Pack years: 15.00    Types: Cigarettes    Quit date: 12/14/1971    Years since quitting: 48.3  . Smokeless tobacco: Former Systems developer    Types: Snuff, Chew  Substance Use Topics  . Alcohol use: Yes    Alcohol/week: 1.0 standard drinks  Types: 1 Standard drinks or equivalent per week    Comment: one shot qhs  . Drug use: No         OPHTHALMIC EXAM:  Base Eye Exam    Visual Acuity (Snellen - Linear)      Right Left   Dist [place] 20/30 -2 20/25 +       Tonometry (Tonopen, 10:40 AM)      Right Left   Pressure 12 14       Pupils      Pupils Dark Light Shape React APD   Right PERRL 4 3 Round Sluggish None   Left PERRL 4 3 Round Sluggish None       Visual Fields (Counting fingers)      Left Right    Full Full       Neuro/Psych    Oriented x3: Yes   Mood/Affect: Normal       Dilation    Left eye: 1.0% Mydriacyl, 2.5% Phenylephrine @ 10:40 AM        Slit Lamp and Fundus Exam    External Exam      Right Left   External  Normal       Slit Lamp Exam      Right Left   Lids/Lashes  Normal   Conjunctiva/Sclera  White and quiet   Cornea  Clear   Anterior Chamber  Deep and quiet   Iris  Round and reactive   Lens  Posterior chamber intraocular lens   Anterior Vitreous  Normal       Fundus Exam      Right Left   Posterior Vitreous  Posterior vitreous detachment   Disc  Normal,, pericapillary CNVM much less active   C/D Ratio  0.35   Macula  Much less peripapillary fluid and now with a subretinal scar   Vessels  Normal   Periphery  Normal          IMAGING AND PROCEDURES  Imaging and Procedures for 03/29/20  OCT, Retina - OU - Both Eyes       Right Eye Quality was good. Scan locations included subfoveal. Central Foveal Thickness: 354. Progression has improved.  Findings include normal observations, retinal drusen .   Left Eye Quality was good. Scan locations included subfoveal. Central Foveal Thickness: 268. Progression has improved. Findings include normal observations, retinal drusen .   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 11:33 AM. Confirmed correct patient, procedure, site, and patient consented.   Anesthesia Topical anesthesia was used. Anesthetic medications included Akten 3.5%.   Procedure Preparation included Tobramycin 0.3%, Ofloxacin , 10% betadine to eyelids. A 30 gauge needle was used.   Injection:  1.25 mg Bevacizumab (AVASTIN) SOLN   NDC: [id number], Lot: [id number]   Route: Intravitreal, Site: Left Eye, Waste: 0 mg  Post-op Post injection exam found visual acuity of at least counting fingers. The patient tolerated the procedure well. There were no complications. The patient received written and verbal post procedure care education. Post injection medications were not given.                 ASSESSMENT/PLAN:  No problem-specific Assessment & Plan notes found for this encounter.  ICD-10-CM   1. Exudative age-related macular degeneration of left eye with active choroidal neovascularization (HCC)  H35.3221 OCT, Retina - OU - Both Eyes    Intravitreal Injection, Pharmacologic Agent - OS - Left Eye    Bevacizumab (AVASTIN) SOLN 1.25 mg  2. Retinal hemorrhage of left eye  H35.62 OCT, Retina - OU - Both Eyes  3. Choroidal neovascularization of left eye  H35.052   4. Pseudophakia  Z96.1   5. Right epiretinal membrane  H35.371   6. 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  2.  3.  Ophthalmic Meds Ordered this visit:  Meds  ordered this encounter  Medications  . Bevacizumab (AVASTIN) SOLN 1.25 mg       No follow-ups on file.  There are no Patient Instructions on file for this visit.   Explained the diagnoses, plan, and follow up with the patient and they expressed understanding.  Patient expressed understanding of the importance of proper follow up care.   Clent Demark Erleen Egner M.D. Diseases & Surgery of the Retina and Vitreous [facility]ina [facility] Diabetic [place] 03/29/20     Abbreviations: M myopia (nearsighted); A astigmatism; H hyperopia (farsighted); P presbyopia; Mrx spectacle prescription;  CTL contact lenses; OD right eye; OS left eye; OU both eyes  XT exotropia; ET esotropia; PEK punctate epithelial keratitis; PEE punctate epithelial erosions; DES dry eye syndrome; MGD meibomian gland dysfunction; ATs artificial tears; PFAT's preservative free artificial tears; [place] nuclear sclerotic cataract; PSC posterior subcapsular cataract; ERM epi-retinal membrane; PVD posterior vitreous detachment; RD retinal detachment; DM diabetes mellitus; DR diabetic retinopathy; NPDR non-proliferative diabetic retinopathy; PDR proliferative diabetic retinopathy; CSME clinically significant macular edema; DME diabetic macular edema; dbh dot blot hemorrhages; CWS cotton wool spot; POAG primary open angle glaucoma; C/D cup-to-disc ratio; HVF humphrey visual field; GVF goldmann visual field; OCT optical coherence tomography; IOP intraocular pressure; BRVO Branch retinal vein occlusion; CRVO central retinal vein occlusion; CRAO central retinal artery occlusion; BRAO branch retinal artery occlusion; RT retinal tear; SB scleral buckle; PPV pars plana vitrectomy; VH Vitreous hemorrhage; PRP panretinal laser photocoagulation; IVK intravitreal kenalog; VMT vitreomacular traction; MH Macular hole;  NVD neovascularization of the disc; NVE neovascularization elsewhere; AREDS age related eye disease study; ARMD age related macular degeneration; POAG  primary open angle glaucoma; EBMD epithelial/anterior basement membrane dystrophy; ACIOL anterior chamber intraocular lens; IOL intraocular lens; PCIOL posterior chamber intraocular lens; Phaco/IOL phacoemulsification with intraocular lens placement; [place] photorefractive keratectomy; LASIK laser assisted in situ keratomileusis; HTN hypertension; DM diabetes mellitus; COPD chronic obstructive pulmonary disease

## 2020-04-10 DIAGNOSIS — I5042 Chronic combined systolic (congestive) and diastolic (congestive) heart failure: Secondary | ICD-10-CM | POA: Diagnosis not present

## 2020-04-10 DIAGNOSIS — E1129 Type 2 diabetes mellitus with other diabetic kidney complication: Secondary | ICD-10-CM | POA: Diagnosis not present

## 2020-04-10 DIAGNOSIS — I130 Hypertensive heart and chronic kidney disease with heart failure and stage 1 through stage 4 chronic kidney disease, or unspecified chronic kidney disease: Secondary | ICD-10-CM | POA: Diagnosis not present

## 2020-04-10 DIAGNOSIS — G4733 Obstructive sleep apnea (adult) (pediatric): Secondary | ICD-10-CM | POA: Diagnosis not present

## 2020-04-10 DIAGNOSIS — N1831 Chronic kidney disease, stage 3a: Secondary | ICD-10-CM | POA: Diagnosis not present

## 2020-04-10 DIAGNOSIS — R0609 Other forms of dyspnea: Secondary | ICD-10-CM | POA: Diagnosis not present

## 2020-04-13 ENCOUNTER — Ambulatory Visit: Payer: Medicare HMO | Admitting: Cardiology

## 2020-04-23 DIAGNOSIS — R69 Illness, unspecified: Secondary | ICD-10-CM | POA: Diagnosis not present

## 2020-04-24 DIAGNOSIS — E7849 Other hyperlipidemia: Secondary | ICD-10-CM | POA: Diagnosis not present

## 2020-04-24 DIAGNOSIS — Z125 Encounter for screening for malignant neoplasm of prostate: Secondary | ICD-10-CM | POA: Diagnosis not present

## 2020-04-24 DIAGNOSIS — Z0000 Encounter for general adult medical examination without abnormal findings: Secondary | ICD-10-CM | POA: Diagnosis not present

## 2020-04-24 DIAGNOSIS — E1129 Type 2 diabetes mellitus with other diabetic kidney complication: Secondary | ICD-10-CM | POA: Diagnosis not present

## 2020-05-01 DIAGNOSIS — Z0000 Encounter for general adult medical examination without abnormal findings: Secondary | ICD-10-CM | POA: Diagnosis not present

## 2020-05-01 DIAGNOSIS — I130 Hypertensive heart and chronic kidney disease with heart failure and stage 1 through stage 4 chronic kidney disease, or unspecified chronic kidney disease: Secondary | ICD-10-CM | POA: Diagnosis not present

## 2020-05-01 DIAGNOSIS — I712 Thoracic aortic aneurysm, without rupture: Secondary | ICD-10-CM | POA: Diagnosis not present

## 2020-05-01 DIAGNOSIS — H3530 Unspecified macular degeneration: Secondary | ICD-10-CM | POA: Diagnosis not present

## 2020-05-01 DIAGNOSIS — N433 Hydrocele, unspecified: Secondary | ICD-10-CM | POA: Diagnosis not present

## 2020-05-01 DIAGNOSIS — I714 Abdominal aortic aneurysm, without rupture: Secondary | ICD-10-CM | POA: Diagnosis not present

## 2020-05-01 DIAGNOSIS — R82998 Other abnormal findings in urine: Secondary | ICD-10-CM | POA: Diagnosis not present

## 2020-05-01 DIAGNOSIS — Z1331 Encounter for screening for depression: Secondary | ICD-10-CM | POA: Diagnosis not present

## 2020-05-01 DIAGNOSIS — I5030 Unspecified diastolic (congestive) heart failure: Secondary | ICD-10-CM | POA: Diagnosis not present

## 2020-05-01 DIAGNOSIS — D696 Thrombocytopenia, unspecified: Secondary | ICD-10-CM | POA: Diagnosis not present

## 2020-05-01 DIAGNOSIS — I10 Essential (primary) hypertension: Secondary | ICD-10-CM | POA: Diagnosis not present

## 2020-05-01 DIAGNOSIS — I5042 Chronic combined systolic (congestive) and diastolic (congestive) heart failure: Secondary | ICD-10-CM | POA: Diagnosis not present

## 2020-05-01 DIAGNOSIS — E1129 Type 2 diabetes mellitus with other diabetic kidney complication: Secondary | ICD-10-CM | POA: Diagnosis not present

## 2020-05-02 NOTE — Progress Notes (Signed)
Follow up visit  Subjective:   Bradley Alvarez, male    DOB: June 25, 1941, 79 y.o.   MRN: [id number]   HPI   Chief Complaint  Patient presents with  . AAA  . Follow-up    6 month    79 y.o. African-American male  with uncontrolled hypertension, uncontrolled type II diabetes mellitus, CKD stage 2/3, OSA on CPAP, heart failure with preserved ejection fraction.  Patient is doing well, denies chest pain, shortness of breath. Leg edema has resolved. Blood pressure remains sub-optimal.   Current Outpatient Medications on File Prior to Visit  Medication Sig Dispense Refill  . aspirin EC 81 MG tablet Take 81 mg by mouth daily.    Marland Kitchen BIDIL 20-37.5 MG tablet TAKE TWO TABLETS BY MOUTH THREE TIMES A DAY  540 tablet 1  . carvedilol (COREG) 25 MG tablet Take 25 mg by mouth 3 (three) times daily.     . clobetasol ointment (TEMOVATE) 6.29 % Apply 1 application topically daily as needed (for itching).     . Cyanocobalamin 2500 MCG TABS Take 500 mcg by mouth daily.     . diclofenac sodium (VOLTAREN) 1 % GEL Apply 1 application topically 4 (four) times daily as needed (For pain.).    Marland Kitchen Dulaglutide (TRULICITY) 1.5 UT/6.5YY SOPN Inject 1 Dose into the skin once a week. Wednesday's    . felodipine (PLENDIL) 5 MG 24 hr tablet Take 5 mg by mouth 2 (two) times daily.     . furosemide (LASIX) 20 MG tablet TAKE ONE TABLET BY MOUTH ONE TIME DAILY *take 1 tablet twice daily for 3 to 5 days if increase leg swelling or shortness of breath occurs* (Patient taking differently: Take 40 mg by mouth daily. ) 90 tablet 0  . HYDROcodone-acetaminophen (NORCO/VICODIN) 5-325 MG tablet 2 (two) times daily.    Marland Kitchen loratadine (CLARITIN) 10 MG tablet Take 10 mg by mouth every morning.     Marland Kitchen losartan (COZAAR) 100 MG tablet Take 100 mg by mouth every evening.     Marland Kitchen MEGARED OMEGA-3 KRILL OIL PO Take 1 tablet by mouth daily.    . metFORMIN (GLUCOPHAGE) 1000 MG tablet Take 1,000 mg by mouth 2 (two) times daily with a meal.    .  Multiple Vitamin (MULTIVITAMIN WITH MINERALS) TABS tablet Take 1 tablet by mouth daily.    Glory Rosebush VERIO test strip USE TO TEST BID AS DIRECTED  2  . Polyethyl Glycol-Propyl Glycol (SYSTANE OP) Apply to eye as needed.    . potassium chloride SA (K-DUR) 20 MEQ tablet     . simvastatin (ZOCOR) 40 MG tablet Take 40 mg by mouth every evening.    Marland Kitchen spironolactone (ALDACTONE) 50 MG tablet Take 1 tablet by mouth daily.    . Turmeric 400 MG CAPS Take by mouth 2 (two) times daily.     No current facility-administered medications on file prior to visit.    Cardiovascular & other pertient studies:  EKG 05/03/2020: Sinus rhythm 65 bpm.  Poor R-wave progression -nonspecific -consider old anterior infarct.    Echocardiogram 09/19/2019: Left ventricle cavity is normal in size. Moderate concentric hypertrophy of the left ventricle. Normal LV systolic function with visual EF 50-55%. Normal global wall motion. Doppler evidence of grade I (impaired) diastolic dysfunction, normal LAP.  Left atrial cavity is moderately dilated. Mild (Grade I) aortic regurgitation. Mild (Grade I) mitral regurgitation. 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 Estimated  RA pressure 10-15 mmHg. Compared to previous study on 11/04/2018, aortic root size increased from 4.5 to 4.7 cm.  Abdominal Aortic Duplex  09/19/2019: Moderate dilatation of the abdominal aorta is noted in the mid and distal aorta. Mild plaque observed in the mid and distal aorta. Normal flow velocities noted in the abdominal and interal iliac arteries.   An abdominal aortic aneurysm measuring 3.41 x 3.4 x 3.33 cm is seen.  Moderate mostly soft plaque noted in the mid and distal aorta.  Compared to 01/24/2019, there is mild progression, previously 3.1 cm in size, maximum size 3.42 cm.  In the distal abdominal aorta. Recheck in 1 year for stability.   EKG 05/07/2018: Sinus rhythm 75 bpm. Normal axis.  Normal conduction. Poor R-wave progression.    Recent labs: 04/24/2020: Glucose 92, BUN/Cr 10/1.3. EGFR 64. Na 141. Rest of the CMP normal H/H 13/42.  HbA1C 5.5 % Chol 134, TG 125, HDL 47, LDL 62 TSH 2.57 normal   11/15/2018: H/H 14.3/42. Glucose 140. BUN/Cr 19/ 1.7. Na/K 138/4.3.    08/27/2018: Glucose 110. BUN/Cr 13/1.4 eGFR 49/59. Na/K 140/4.1 BNP 12   04/13/2018: H/H 13/44. MCV 88. Platelets 161 Glucose 93. BUN/creatinine 11/1.3. EGFR 53/64. Sodium 138, potassium 4.5 Chol 135, TG 78, HDL 49 LDL 70 Apolipoprotein B 60 Normal TSH 2/1 normal   08/2017: HbA1c 6.6 %    Review of Systems  Cardiovascular: Negative for chest pain, dyspnea on exertion, leg swelling, palpitations and syncope.       Vitals:   05/03/20 0924  BP: (!) 147/81  Pulse: 67  Resp: 17  SpO2: 94%    Body mass index is 40.44 kg/m. Filed Weights   05/03/20 0924  Weight: (!) 315 lb (142.9 kg)     Objective:   Physical Exam  Constitutional: No distress.  Neck: No JVD present.  Cardiovascular: Normal rate, regular rhythm, normal heart sounds and intact distal pulses. Exam reveals no gallop.  No murmur heard. Pulses:      Carotid pulses are 2+ on the right side and 2+ on the left side.      Dorsalis pedis pulses are 2+ on the right side and 2+ on the left side.       Posterior tibial pulses are 2+ on the right side and 2+ on the left side.  Pulmonary/Chest: Effort normal and breath sounds normal. He has no wheezes. He has no rales.  Musculoskeletal:        General: No edema.  Neurological: No cranial nerve deficit.  Psychiatric: He has a normal mood and affect.  Nursing note and vitals reviewed.         Assessment & Recommendations:   79 y.o. African-American male  with uncontrolled hypertension, uncontrolled type II diabetes mellitus, CKD stage 2/3, OSA on CPAP, heart failure with preserved ejection fraction.  Hypertension: Suboptimal control.  Given his ascending aorta and  abdominal aorta aneurysm, it is important to be controlled his heart rate and blood pressure.  Increase Carvedilol to 50 mg bid. Continue BiDil 20-37.5 mg 2 tablets 3 times daily, felodipine 5 mg bid, losartan 100 mg daily.   Dilated aortic root/ TAA: Will obtain echocardiogram. If increase in size, will obtain CTA. Also, repeat echocardiogram in 6 months  AAA: An abdominal aortic aneurysm measuring 3.41 x 3.4 x 3.33 cm is seen.  Moderate mostly soft plaque noted in the mid and distal aorta. Repeat US in 10/2020    Nigel Mormon, MD [facility]. [place] Pager:  [phone] Office: [phone]

## 2020-05-03 ENCOUNTER — Ambulatory Visit (INDEPENDENT_AMBULATORY_CARE_PROVIDER_SITE_OTHER): Payer: Medicare HMO | Admitting: Cardiology

## 2020-05-03 ENCOUNTER — Other Ambulatory Visit: Payer: Self-pay

## 2020-05-03 ENCOUNTER — Encounter: Payer: Self-pay | Admitting: Cardiology

## 2020-05-03 VITALS — BP 147/81 | HR 67 | Resp 17 | Ht 74.0 in | Wt 315.0 lb

## 2020-05-03 DIAGNOSIS — I712 Thoracic aortic aneurysm, without rupture, unspecified: Secondary | ICD-10-CM

## 2020-05-03 DIAGNOSIS — I714 Abdominal aortic aneurysm, without rupture, unspecified: Secondary | ICD-10-CM

## 2020-05-03 DIAGNOSIS — I10 Essential (primary) hypertension: Secondary | ICD-10-CM | POA: Diagnosis not present

## 2020-05-03 MED ORDER — CARVEDILOL 25 MG PO TABS: 50.0000 mg | ORAL_TABLET | Freq: Two times a day (BID) | ORAL | 3 refills | Status: DC

## 2020-05-04 ENCOUNTER — Ambulatory Visit: Payer: Medicare HMO | Admitting: Podiatry

## 2020-05-04 ENCOUNTER — Encounter: Payer: Self-pay | Admitting: Podiatry

## 2020-05-04 DIAGNOSIS — M79675 Pain in left toe(s): Secondary | ICD-10-CM | POA: Diagnosis not present

## 2020-05-04 DIAGNOSIS — B351 Tinea unguium: Secondary | ICD-10-CM

## 2020-05-04 DIAGNOSIS — M79674 Pain in right toe(s): Secondary | ICD-10-CM | POA: Diagnosis not present

## 2020-05-04 DIAGNOSIS — E119 Type 2 diabetes mellitus without complications: Secondary | ICD-10-CM | POA: Diagnosis not present

## 2020-05-04 DIAGNOSIS — M2010 Hallux valgus (acquired), unspecified foot: Secondary | ICD-10-CM

## 2020-05-04 NOTE — Progress Notes (Signed)
This patient returns to my office for at risk foot care.  This patient requires this care by a professional since this patient will be at risk due to having  Diabetes.  This patient is unable to cut nails himself since the patient cannot reach his nails.These nails are painful walking and wearing shoes.  This patient presents for at risk foot care today.  General Appearance  Alert, conversant and in no acute stress.  Vascular  Dorsalis pedis and posterior tibial  pulses are palpable  bilaterally.  Capillary return is within normal limits  bilaterally. Temperature is within normal limits  bilaterally.  Neurologic  Senn-Weinstein monofilament wire test within normal limits  bilaterally. Muscle power within normal limits bilaterally.  Nails Thick disfigured discolored nails with subungual debris  from hallux to fifth toes bilaterally. No evidence of bacterial infection or drainage bilaterally.  Orthopedic  No limitations of motion  feet .  No crepitus or effusions noted.  No bony pathology or digital deformities noted. HAV  B/L.  PTT tendinitis/arthritis left foot.  Pes planus  Skin  normotropic skin with no porokeratosis noted bilaterally.  No signs of infections or ulcers noted.     Onychomycosis  Pain in right toes  Pain in left toes  Consent was obtained for treatment procedures.   Mechanical debridement of nails 1-5  bilaterally performed with a nail nipper.  Filed with dremel without incident.    Return office visit   3 months                   Told patient to return for periodic foot care and evaluation due to potential at risk complications.   Vanden Fawaz DPM  

## 2020-05-09 ENCOUNTER — Other Ambulatory Visit: Payer: Medicare HMO

## 2020-05-10 DIAGNOSIS — Z1212 Encounter for screening for malignant neoplasm of rectum: Secondary | ICD-10-CM | POA: Diagnosis not present

## 2020-05-17 ENCOUNTER — Other Ambulatory Visit: Payer: Self-pay

## 2020-05-17 ENCOUNTER — Ambulatory Visit (INDEPENDENT_AMBULATORY_CARE_PROVIDER_SITE_OTHER): Payer: Medicare HMO | Admitting: Ophthalmology

## 2020-05-17 ENCOUNTER — Encounter (INDEPENDENT_AMBULATORY_CARE_PROVIDER_SITE_OTHER): Payer: Self-pay | Admitting: Ophthalmology

## 2020-05-17 DIAGNOSIS — H35371 Puckering of macula, right eye: Secondary | ICD-10-CM | POA: Diagnosis not present

## 2020-05-17 DIAGNOSIS — H353221 Exudative age-related macular degeneration, left eye, with active choroidal neovascularization: Secondary | ICD-10-CM | POA: Diagnosis not present

## 2020-05-17 MED ORDER — BEVACIZUMAB CHEMO INJECTION 1.25MG/0.05ML SYRINGE FOR KALEIDOSCOPE
1.2500 mg | INTRAVITREAL | Status: AC | PRN
  Administered 2020-05-17: 1.25 mg via INTRAVITREAL

## 2020-05-17 NOTE — Assessment & Plan Note (Signed)
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

## 2020-05-17 NOTE — Assessment & Plan Note (Signed)
Minor, Observe

## 2020-05-17 NOTE — Progress Notes (Signed)
05/17/2020     CHIEF COMPLAINT Patient presents for Retina Follow Up   HISTORY OF PRESENT ILLNESS: Bradley Alvarez is a 79 y.o. male who presents to the clinic today for:   HPI    Retina Follow Up    Patient presents with  Wet AMD.  In left eye.  Duration of 7 weeks.  Since onset it is stable.          Comments    7 week follow up - OCT OU, Poss Avastin OS Patient denies change in vision and overall has no complaints, except for a few floaters.        Last edited by Gerda Diss on 05/17/2020 10:15 AM. (History)      Referring physician: [name], MD [place],  [place] [address]  HISTORICAL INFORMATION:   Selected notes from the MEDICAL RECORD NUMBER    Lab Results  Component Value Date   HGBA1C 6.6 (H) 09/10/2017     CURRENT MEDICATIONS: Current Outpatient Medications (Ophthalmic Drugs)  Medication Sig  . Polyethyl Glycol-Propyl Glycol (SYSTANE OP) Apply to eye as needed.   No current facility-administered medications for this visit. (Ophthalmic Drugs)   Current Outpatient Medications (Other)  Medication Sig  . aspirin EC 81 MG tablet Take 81 mg by mouth daily.  Marland Kitchen BIDIL 20-37.5 MG tablet TAKE TWO TABLETS BY MOUTH THREE TIMES A DAY   . carvedilol (COREG) 25 MG tablet Take 2 tablets (50 mg total) by mouth 2 (two) times daily with a meal.  . clobetasol ointment (TEMOVATE) 6.54 % Apply 1 application topically daily as needed (for itching).   . Cyanocobalamin 2500 MCG TABS Take 500 mcg by mouth daily.   . diclofenac sodium (VOLTAREN) 1 % GEL Apply 1 application topically 4 (four) times daily as needed (For pain.).  Marland Kitchen Dulaglutide (TRULICITY) 1.5 YT/0.3TW SOPN Inject 1 Dose into the skin once a week. Wednesday's  . felodipine (PLENDIL) 5 MG 24 hr tablet Take 5 mg by mouth 2 (two) times daily.   . furosemide (LASIX) 20 MG tablet TAKE ONE TABLET BY MOUTH ONE TIME DAILY *take 1 tablet twice daily for 3 to 5 days if increase leg swelling or shortness of  breath occurs* (Patient taking differently: Take 40 mg by mouth daily. )  . HYDROcodone-acetaminophen (NORCO/VICODIN) 5-325 MG tablet 2 (two) times daily.  Marland Kitchen loratadine (CLARITIN) 10 MG tablet Take 10 mg by mouth every morning.   Marland Kitchen losartan (COZAAR) 100 MG tablet Take 100 mg by mouth every evening.   Marland Kitchen MEGARED OMEGA-3 KRILL OIL PO Take 1 tablet by mouth daily.  . metFORMIN (GLUCOPHAGE) 1000 MG tablet Take 1,000 mg by mouth 2 (two) times daily with a meal.  . Multiple Vitamin (MULTIVITAMIN WITH MINERALS) TABS tablet Take 1 tablet by mouth daily.  Glory Rosebush VERIO test strip USE TO TEST BID AS DIRECTED  . potassium chloride SA (K-DUR) 20 MEQ tablet   . simvastatin (ZOCOR) 40 MG tablet Take 40 mg by mouth every evening.  Marland Kitchen spironolactone (ALDACTONE) 50 MG tablet Take 1 tablet by mouth daily.  . Turmeric 400 MG CAPS Take by mouth 2 (two) times daily.   No current facility-administered medications for this visit. (Other)      REVIEW OF SYSTEMS:    ALLERGIES No Known Allergies  PAST MEDICAL HISTORY Past Medical History:  Diagnosis Date  . AAA (abdominal aortic aneurysm) without rupture ([place])    followed by dr Derek Jack--  per office  note dated 11-05-2018  3.5cm  . Benign essential HTN    cardiologist-- dr Margart Sickles  . Chronic combined systolic (congestive) and diastolic (congestive) heart failure [facility])    cardiologist-- dr Robb Matar  . CKD (chronic kidney disease), stage III   . Diabetes mellitus type 2, insulin dependent ([place])    followed by dr Virgina Jock  . Diabetic neuropathy ([place])   . DOE (dyspnea on exertion)   . Edema of both lower extremities   . Full dentures   . HOH (hard of hearing)    does wear hearing aids  . Hydrocele, bilateral   . Lichen planus   . Mixed hyperlipidemia   . Morbid obesity with BMI of 45.0-49.9, adult ([place])   . Nocturia   . OA (osteoarthritis)   . OSA on CPAP 10/14/08   per study  AHI  61.8/hr ,  RDI  96.1/hr.    Past Surgical History:    Procedure Laterality Date  . CARDIAC CATHETERIZATION  per pt yrs ago   was told no blockage  . CARDIOVASCULAR STRESS TEST  07-19-2014   dr Shelva Majestic   Low risk nuclear study w/ small apical attentuation artifact/  normal LV function and wall motion , ef 53%  . CATARACT EXTRACTION W/ INTRAOCULAR LENS  IMPLANT, BILATERAL  2018  . COLONOSCOPY W/ BIOPSIES AND POLYPECTOMY    . COLONOSCOPY WITH PROPOFOL N/A 05/16/2016   Procedure: COLONOSCOPY WITH PROPOFOL;  Surgeon: Carol Ada, MD;  Location: [facility];  Service: Endoscopy;  Laterality: N/A;  . HYDROCELE EXCISION Bilateral 11/15/2018   Procedure: HYDROCELECTOMY ADULT;  Surgeon: Kathie Rhodes, MD;  Location: [facility];  Service: Urology;  Laterality: Bilateral;  . KNEE ARTHROSCOPY Right 2009  . MULTIPLE TOOTH EXTRACTIONS    . ORIF ANKLE FRACTURE Left 09/13/2014   Procedure: Open Reduction Internal Fixation Left Ankle Medial Malleolus;  Surgeon: Newt Minion, MD;  Location: [facility];  Service: Orthopedics;  Laterality: Left;  . PROSTATECTOMY  1993 approx.  by dr Diona Fanti   for prostate enlargement  . TOTAL KNEE ARTHROPLASTY Right 08/27/2009   dr Noemi Chapel @[facility]   . TOTAL KNEE ARTHROPLASTY  12/20/2012   Procedure: TOTAL KNEE ARTHROPLASTY;  Surgeon: Lorn Junes, MD;  Location: [facility];  Service: Orthopedics;  Laterality: Left;  left total knee arthroplasty  . TRANSTHORACIC ECHOCARDIOGRAM  11-04-2018   dr patwardhan   moderate to severe concentric LVH, ef 56%,  grade 1 diastolic dysfunciton/  mild AV calcification annulus and leaflets without stentosis ,  mild regurg./  moderate LAE/ dilated aortic root, 4.5cm/  mild MV annulus calcification with trace regurg./  trace TR    FAMILY HISTORY Family History  Problem Relation Age of Onset  . Hypertension Mother   . Diabetes Mellitus II Mother   . Heart attack Father   . Diabetes Mellitus II Sister   . Heart disease Sister   . Diabetes Mellitus II Brother   . Stroke Brother    . Diabetes Mellitus II Brother   . Kidney disease Brother   . Diabetes Mellitus II Brother   . Diabetes Mellitus II Sister   . Heart disease Sister   . Hypertension Other   . Diabetes Mellitus II Other   . Stroke Other   . Heart disease Other     SOCIAL HISTORY Social History   Tobacco Use  . Smoking status: Former Smoker    Packs/day: 1.50    Years: 10.00    Pack years: 15.00  Types: Cigarettes    Quit date: 12/14/1971    Years since quitting: 48.4  . Smokeless tobacco: Former Systems developer    Types: Snuff, Chew  Vaping Use  . Vaping Use: Never used  Substance Use Topics  . Alcohol use: Yes    Alcohol/week: 1.0 standard drink    Types: 1 Standard drinks or equivalent per week    Comment: one shot qhs  . Drug use: No         OPHTHALMIC EXAM:  Base Eye Exam    Visual Acuity (Snellen - Linear)      Right Left   Dist [place] 20/30-1 20/25-2       Tonometry (Tonopen, 10:19 AM)      Right Left   Pressure 14 11       Pupils      Pupils Dark Light Shape React APD   Right PERRL 4 3 Round Sluggish None   Left PERRL 4 3 Round Sluggish None       Visual Fields (Counting fingers)      Left Right    Full Full       Extraocular Movement      Right Left    Full Full       Neuro/Psych    Oriented x3: Yes   Mood/Affect: Normal       Dilation    Left eye: 1.0% Mydriacyl, 2.5% Phenylephrine @ 10:20 AM        Slit Lamp and Fundus Exam    External Exam      Right Left   External Normal Normal       Slit Lamp Exam      Right Left   Lids/Lashes Normal Normal   Conjunctiva/Sclera White and quiet White and quiet   Cornea Clear Clear   Anterior Chamber Deep and quiet Deep and quiet   Iris Round and reactive Round and reactive   Lens  Posterior chamber intraocular lens   Anterior Vitreous Normal Normal       Fundus Exam      Right Left   Posterior Vitreous  Posterior vitreous detachment   Disc  Normal,, pericapillary CNVM much less active   C/D Ratio  0.35    Macula  no peripapillary fluid and now with a subretinal scar   Vessels  Normal   Periphery  Normal          IMAGING AND PROCEDURES  Imaging and Procedures for 05/17/20  OCT, Retina - OU - Both Eyes       Right Eye Quality was good. Scan locations included subfoveal. Central Foveal Thickness: 359. Findings include epiretinal membrane.   Left Eye Quality was good. Scan locations included subfoveal. Central Foveal Thickness: 270. Progression has improved. Findings include no SRF.   Notes OS as compared to February 2021, much less subretinal fluid and a smaller CNVM pericapillary currently on 7-week examination.  We will repeat intravitreal Avastin OS today and examination in 9 weeks                ASSESSMENT/PLAN:  Exudative age-related macular degeneration of left eye with active choroidal neovascularization ([place]) Vastly improved anatomy of pericapillary CNVM once active February 2021, now involutional and no evidence of activity ongoing at 7-week interval of intravitreal Avastin.      ICD-10-CM   1. Exudative age-related macular degeneration of left eye with active choroidal neovascularization (HCC)  H35.3221 OCT, Retina - OU - Both Eyes    1.  To report any change in his visual acuity in either eye prior to his scheduled follow-up  2.  3.  Ophthalmic Meds Ordered this visit:  No orders of the defined types were placed in this encounter.      Return in about 9 weeks (around 07/19/2020) for DILATE OU, AVASTIN OCT, OS.  There are no Patient Instructions on file for this visit.   Explained the diagnoses, plan, and follow up with the patient and they expressed understanding.  Patient expressed understanding of the importance of proper follow up care.   Clent Demark Denise Bramblett M.D. Diseases & Surgery of the Retina and Vitreous Retina & Diabetic [place] 05/17/20     Abbreviations: M myopia (nearsighted); A astigmatism; H hyperopia (farsighted); P  presbyopia; Mrx spectacle prescription;  CTL contact lenses; OD right eye; OS left eye; OU both eyes  XT exotropia; ET esotropia; PEK punctate epithelial keratitis; PEE punctate epithelial erosions; DES dry eye syndrome; MGD meibomian gland dysfunction; ATs artificial tears; PFAT's preservative free artificial tears; [place] nuclear sclerotic cataract; PSC posterior subcapsular cataract; ERM epi-retinal membrane; PVD posterior vitreous detachment; RD retinal detachment; DM diabetes mellitus; DR diabetic retinopathy; NPDR non-proliferative diabetic retinopathy; PDR proliferative diabetic retinopathy; CSME clinically significant macular edema; DME diabetic macular edema; dbh dot blot hemorrhages; CWS cotton wool spot; POAG primary open angle glaucoma; C/D cup-to-disc ratio; HVF humphrey visual field; GVF goldmann visual field; OCT optical coherence tomography; IOP intraocular pressure; BRVO Branch retinal vein occlusion; CRVO central retinal vein occlusion; CRAO central retinal artery occlusion; BRAO branch retinal artery occlusion; RT retinal tear; SB scleral buckle; PPV pars plana vitrectomy; VH Vitreous hemorrhage; PRP panretinal laser photocoagulation; IVK intravitreal kenalog; VMT vitreomacular traction; MH Macular hole;  NVD neovascularization of the disc; NVE neovascularization elsewhere; AREDS age related eye disease study; ARMD age related macular degeneration; POAG primary open angle glaucoma; EBMD epithelial/anterior basement membrane dystrophy; ACIOL anterior chamber intraocular lens; IOL intraocular lens; PCIOL posterior chamber intraocular lens; Phaco/IOL phacoemulsification with intraocular lens placement; [place] photorefractive keratectomy; LASIK laser assisted in situ keratomileusis; HTN hypertension; DM diabetes mellitus; COPD chronic obstructive pulmonary disease

## 2020-06-25 ENCOUNTER — Other Ambulatory Visit: Payer: Self-pay | Admitting: Cardiology

## 2020-07-09 DIAGNOSIS — H40013 Open angle with borderline findings, low risk, bilateral: Secondary | ICD-10-CM | POA: Diagnosis not present

## 2020-07-19 ENCOUNTER — Encounter (INDEPENDENT_AMBULATORY_CARE_PROVIDER_SITE_OTHER): Payer: Medicare HMO | Admitting: Ophthalmology

## 2020-08-10 ENCOUNTER — Other Ambulatory Visit: Payer: Self-pay

## 2020-08-10 ENCOUNTER — Encounter: Payer: Self-pay | Admitting: Podiatry

## 2020-08-10 ENCOUNTER — Ambulatory Visit: Payer: Medicare HMO | Admitting: Podiatry

## 2020-08-10 DIAGNOSIS — M79674 Pain in right toe(s): Secondary | ICD-10-CM | POA: Diagnosis not present

## 2020-08-10 DIAGNOSIS — M79675 Pain in left toe(s): Secondary | ICD-10-CM

## 2020-08-10 DIAGNOSIS — B351 Tinea unguium: Secondary | ICD-10-CM

## 2020-08-10 DIAGNOSIS — E119 Type 2 diabetes mellitus without complications: Secondary | ICD-10-CM

## 2020-08-10 NOTE — Progress Notes (Signed)
This patient returns to my office for at risk foot care.  This patient requires this care by a professional since this patient will be at risk due to having  Diabetes.  This patient is unable to cut nails himself since the patient cannot reach his nails.These nails are painful walking and wearing shoes.  This patient presents for at risk foot care today.  General Appearance  Alert, conversant and in no acute stress.  Vascular  Dorsalis pedis and posterior tibial  pulses are palpable  bilaterally.  Capillary return is within normal limits  bilaterally. Temperature is within normal limits  bilaterally.  Neurologic  Senn-Weinstein monofilament wire test within normal limits  bilaterally. Muscle power within normal limits bilaterally.  Nails Thick disfigured discolored nails with subungual debris  from hallux to fifth toes bilaterally. No evidence of bacterial infection or drainage bilaterally.  Orthopedic  No limitations of motion  feet .  No crepitus or effusions noted.  No bony pathology or digital deformities noted. HAV  B/L.  PTT tendinitis/arthritis left foot.  Pes planus  Skin  normotropic skin with no porokeratosis noted bilaterally.  No signs of infections or ulcers noted.     Onychomycosis  Pain in right toes  Pain in left toes  Consent was obtained for treatment procedures.   Mechanical debridement of nails 1-5  bilaterally performed with a nail nipper.  Filed with dremel without incident.    Return office visit   3 months                   Told patient to return for periodic foot care and evaluation due to potential at risk complications.   Gardiner Barefoot DPM

## 2020-09-21 ENCOUNTER — Ambulatory Visit: Payer: Medicare HMO

## 2020-09-21 ENCOUNTER — Other Ambulatory Visit: Payer: Self-pay

## 2020-09-21 DIAGNOSIS — I10 Essential (primary) hypertension: Secondary | ICD-10-CM | POA: Diagnosis not present

## 2020-09-21 DIAGNOSIS — I714 Abdominal aortic aneurysm, without rupture, unspecified: Secondary | ICD-10-CM

## 2020-09-24 NOTE — Progress Notes (Signed)
Stable AAA> I have not ordered as we do not have option to order for 3 years from now

## 2020-09-24 NOTE — Progress Notes (Signed)
Spoke with patient. Patient voiced understanding.

## 2020-09-25 DIAGNOSIS — K760 Fatty (change of) liver, not elsewhere classified: Secondary | ICD-10-CM | POA: Diagnosis not present

## 2020-09-25 DIAGNOSIS — I130 Hypertensive heart and chronic kidney disease with heart failure and stage 1 through stage 4 chronic kidney disease, or unspecified chronic kidney disease: Secondary | ICD-10-CM | POA: Diagnosis not present

## 2020-09-25 DIAGNOSIS — E785 Hyperlipidemia, unspecified: Secondary | ICD-10-CM | POA: Diagnosis not present

## 2020-09-25 DIAGNOSIS — I714 Abdominal aortic aneurysm, without rupture: Secondary | ICD-10-CM | POA: Diagnosis not present

## 2020-09-25 DIAGNOSIS — I5042 Chronic combined systolic (congestive) and diastolic (congestive) heart failure: Secondary | ICD-10-CM | POA: Diagnosis not present

## 2020-09-25 DIAGNOSIS — E1129 Type 2 diabetes mellitus with other diabetic kidney complication: Secondary | ICD-10-CM | POA: Diagnosis not present

## 2020-09-25 DIAGNOSIS — N1831 Chronic kidney disease, stage 3a: Secondary | ICD-10-CM | POA: Diagnosis not present

## 2020-09-25 DIAGNOSIS — Z23 Encounter for immunization: Secondary | ICD-10-CM | POA: Diagnosis not present

## 2020-09-25 DIAGNOSIS — G629 Polyneuropathy, unspecified: Secondary | ICD-10-CM | POA: Diagnosis not present

## 2020-10-04 ENCOUNTER — Other Ambulatory Visit: Payer: Self-pay | Admitting: Cardiology

## 2020-10-04 ENCOUNTER — Other Ambulatory Visit: Payer: Self-pay

## 2020-10-04 DIAGNOSIS — I10 Essential (primary) hypertension: Secondary | ICD-10-CM

## 2020-11-01 ENCOUNTER — Encounter: Payer: Self-pay | Admitting: Cardiology

## 2020-11-01 ENCOUNTER — Ambulatory Visit: Payer: Medicare HMO | Admitting: Cardiology

## 2020-11-01 ENCOUNTER — Other Ambulatory Visit: Payer: Self-pay

## 2020-11-01 VITALS — BP 130/71 | HR 73 | Resp 16 | Ht 74.0 in | Wt 303.0 lb

## 2020-11-01 DIAGNOSIS — I714 Abdominal aortic aneurysm, without rupture, unspecified: Secondary | ICD-10-CM

## 2020-11-01 DIAGNOSIS — I10 Essential (primary) hypertension: Secondary | ICD-10-CM

## 2020-11-01 DIAGNOSIS — I712 Thoracic aortic aneurysm, without rupture, unspecified: Secondary | ICD-10-CM

## 2020-11-01 NOTE — Progress Notes (Signed)
Follow up visit  Subjective:   Bradley Alvarez, male    DOB: Jan 21, 1941, 79 y.o.   MRN: [id number]   HPI   Chief Complaint  Patient presents with  . AAA  . Hypertension  . Follow-up    6 moth    79 y.o. African-American male  with uncontrolled hypertension, uncontrolled type II diabetes mellitus, CKD stage 2/3, OSA on CPAP, heart failure with preserved ejection fraction.  Patient is doing well, denies chest pain. Exertional dyspnea is mild and unchanged. Leg edema has resolved. Blood pressure and weight are much better controlled.   Current Outpatient Medications on File Prior to Visit  Medication Sig Dispense Refill  . aspirin EC 81 MG tablet Take 81 mg by mouth daily.    Marland Kitchen BIDIL 20-37.5 MG tablet TAKE 2 TABLETS BY MOUTH THREE TIMES A DAY 540 tablet 1  . carvedilol (COREG) 25 MG tablet Take 2 tablets (50 mg total) by mouth 2 (two) times daily with a meal. 180 tablet 3  . clobetasol ointment (TEMOVATE) 1.00 % Apply 1 application topically daily as needed (for itching).     . Cyanocobalamin 2500 MCG TABS Take 500 mcg by mouth daily.  (Patient not taking: Reported on 08/10/2020)    . diclofenac sodium (VOLTAREN) 1 % GEL Apply 1 application topically 4 (four) times daily as needed (For pain.).    Marland Kitchen Dulaglutide (TRULICITY) 1.5 FH/2.1FX SOPN Inject 1 Dose into the skin once a week. Wednesday's    . felodipine (PLENDIL) 5 MG 24 hr tablet Take 5 mg by mouth 2 (two) times daily.     . fluticasone (CUTIVATE) 0.005 % ointment Apply 1 application topically 2 (two) times daily.    . furosemide (LASIX) 20 MG tablet TAKE ONE TABLET BY MOUTH ONE TIME DAILY *take 1 tablet twice daily for 3 to 5 days if increase leg swelling or shortness of breath occurs* (Patient taking differently: Take 40 mg by mouth daily. ) 90 tablet 0  . HYDROcodone-acetaminophen (NORCO/VICODIN) 5-325 MG tablet 2 (two) times daily.    Marland Kitchen loratadine (CLARITIN) 10 MG tablet Take 10 mg by mouth every morning.     Marland Kitchen losartan  (COZAAR) 100 MG tablet Take 100 mg by mouth every evening.     Marland Kitchen MEGARED OMEGA-3 KRILL OIL PO Take 1 tablet by mouth daily.    . metFORMIN (GLUCOPHAGE) 1000 MG tablet Take 1,000 mg by mouth 2 (two) times daily with a meal.    . Multiple Vitamin (MULTIVITAMIN WITH MINERALS) TABS tablet Take 1 tablet by mouth daily.    Glory Rosebush VERIO test strip USE TO TEST BID AS DIRECTED  2  . Polyethyl Glycol-Propyl Glycol (SYSTANE OP) Apply to eye as needed.    . potassium chloride SA (K-DUR) 20 MEQ tablet     . simvastatin (ZOCOR) 40 MG tablet Take 40 mg by mouth every evening.    Marland Kitchen spironolactone (ALDACTONE) 50 MG tablet Take 1 tablet by mouth daily.    . Turmeric 400 MG CAPS Take by mouth 2 (two) times daily.    . urea (CARMOL) 10 % cream Apply topically as needed.     No current facility-administered medications on file prior to visit.    Cardiovascular & other pertient studies:  EKG 11/01/2020: Sinus rhythm 68 bpm Old anterior infarct Poor R-wave progression  Echocardiogram 09/21/2020:  1. Normal LV systolic function with visual EF 50-55%. Left ventricle  cavity is normal in size. Severe left ventricular hypertrophy. Normal  global wall motion. Doppler evidence of grade II (pseudonormal) diastolic  dysfunction, elevated LAP.  2. Left atrial cavity is moderately dilated.  3. Mild (Grade I) mitral regurgitation.  4. Mild (Grade I) aortic regurgitation.  5. Insignificant pericardial effusion.  6. The aortic root is dilated, Sinotubular junction 4.6cm. Proximal  ascending aorta not will visualized.  7. Compared to prior study dated 09/09/2019: No significant changes noted.  Abdominal Aortic Duplex 09/21/2020:  Moderate dilatation of the abdominal aorta is noted in the mid and distal  aorta. Abdominal aortic aneurysm observed. An abdominal aortic aneurysm  measuring 3.66 x 3.62 x 3.61 cm is seen.  Normal iliac artery velocity right and mildly dampened left iliac artery  velocity and may  suggest suggest <50% stenosis.  No significant change from 09/19/2019. Recheck in 3 years.   Recent labs: 04/24/2020: Glucose 92, BUN/Cr 10/1.3. EGFR 64. Na 141. Rest of the CMP normal H/H 13/42.  HbA1C 5.5 % Chol 134, TG 125, HDL 47, LDL 62 TSH 2.57 normal   Review of Systems  Cardiovascular: Negative for chest pain, dyspnea on exertion, leg swelling, palpitations and syncope.       Vitals:   11/01/20 0923  BP: 130/71  Pulse: 73  Resp: 16  SpO2: 92%     Body mass index is 38.9 kg/m. Filed Weights   11/01/20 0923  Weight: (!) 303 lb (137.4 kg)     Objective:   Physical Exam Vitals and nursing note reviewed.  Constitutional:      General: He is not in acute distress. Neck:     Vascular: No JVD.  Cardiovascular:     Rate and Rhythm: Normal rate and regular rhythm.     Pulses: Intact distal pulses.          Carotid pulses are 2+ on the right side and 2+ on the left side.      Dorsalis pedis pulses are 2+ on the right side and 2+ on the left side.       Posterior tibial pulses are 2+ on the right side and 2+ on the left side.     Heart sounds: Normal heart sounds. No murmur heard.  No gallop.   Pulmonary:     Effort: Pulmonary effort is normal.     Breath sounds: Normal breath sounds. No wheezing or rales.  Neurological:     Cranial Nerves: No cranial nerve deficit.           Assessment & Recommendations:   79 y.o. African-American male  with uncontrolled hypertension, uncontrolled type II diabetes mellitus, CKD stage 2/3, OSA on CPAP, heart failure with preserved ejection fraction.  Essential hypertension: Well controlled. No change made today.  Thoracic aortic aneurysm without rupture [facility]): Stable at 4.6-4.7 cm on echocardiogram., Will obtain CTA to correlate. Check BMP. Encourage hydration. Repeat echocardiogram in 1 year.   Abdominal aortic aneurysm (AAA) 30 to 34 mm in diameter: Small, stable. Repeat aorta duplex in 3 years  F/u in 6  months  Brinley Treanor Esther Hardy, MD [facility] Pager: [phone] Office: [phone]

## 2020-11-02 ENCOUNTER — Ambulatory Visit: Payer: Medicare HMO | Admitting: Cardiology

## 2020-11-09 ENCOUNTER — Ambulatory Visit: Payer: Medicare HMO | Admitting: Podiatry

## 2020-11-09 ENCOUNTER — Other Ambulatory Visit (HOSPITAL_COMMUNITY): Payer: Self-pay | Admitting: Cardiology

## 2020-11-09 ENCOUNTER — Encounter: Payer: Self-pay | Admitting: Podiatry

## 2020-11-09 ENCOUNTER — Other Ambulatory Visit: Payer: Self-pay

## 2020-11-09 DIAGNOSIS — M2140 Flat foot [pes planus] (acquired), unspecified foot: Secondary | ICD-10-CM

## 2020-11-09 DIAGNOSIS — E119 Type 2 diabetes mellitus without complications: Secondary | ICD-10-CM

## 2020-11-09 DIAGNOSIS — B351 Tinea unguium: Secondary | ICD-10-CM | POA: Diagnosis not present

## 2020-11-09 DIAGNOSIS — M79675 Pain in left toe(s): Secondary | ICD-10-CM | POA: Diagnosis not present

## 2020-11-09 DIAGNOSIS — M79674 Pain in right toe(s): Secondary | ICD-10-CM

## 2020-11-09 DIAGNOSIS — I712 Thoracic aortic aneurysm, without rupture: Secondary | ICD-10-CM | POA: Diagnosis not present

## 2020-11-09 NOTE — Progress Notes (Signed)
This patient returns to my office for at risk foot care.  This patient requires this care by a professional since this patient will be at risk due to having  Diabetes.  This patient is unable to cut nails himself since the patient cannot reach his nails.These nails are painful walking and wearing shoes.  This patient presents for at risk foot care today.  General Appearance  Alert, conversant and in no acute stress.  Vascular  Dorsalis pedis  Are  weakly  palpable  bilaterally.  Posterior tibial pulses are absent due to ankle swelling.  Absent pedal hair.Capillary return is within normal limits  bilaterally. Temperature is within normal limits  bilaterally.  Neurologic  Senn-Weinstein monofilament wire test within normal limits  bilaterally. Muscle power within normal limits bilaterally.  Nails Thick disfigured discolored nails with subungual debris  from hallux to fifth toes bilaterally. No evidence of bacterial infection or drainage bilaterally.  Orthopedic  No limitations of motion  feet .  No crepitus or effusions noted.  No bony pathology or digital deformities noted. HAV  B/L.  PTT tendinitis/arthritis left foot.  Pes planus  Skin  normotropic skin with no porokeratosis noted bilaterally.  No signs of infections or ulcers noted.     Onychomycosis  Pain in right toes  Pain in left toes  Consent was obtained for treatment procedures.   Mechanical debridement of nails 1-5  bilaterally performed with a nail nipper.  Filed with dremel without incident.    Return office visit   3 months                   Told patient to return for periodic foot care and evaluation due to potential at risk complications.   Idy Rawling DPM  

## 2020-11-09 NOTE — Addendum Note (Signed)
Addended byDeidre Ala, Mayling Aber L on: 11/09/2020 11:49 AM   Modules accepted: Orders

## 2020-11-10 LAB — BASIC METABOLIC PANEL
BUN/Creatinine Ratio: 12 (ref 10–24)
BUN: 16 mg/dL (ref 8–27)
CO2: 24 mmol/L (ref 20–29)
Calcium: 9.6 mg/dL (ref 8.6–10.2)
Chloride: 99 mmol/L (ref 96–106)
Creatinine, Ser: 1.36 mg/dL — ABNORMAL HIGH (ref 0.76–1.27)
GFR calc Af Amer: 57 mL/min/{1.73_m2} — ABNORMAL LOW (ref >59)
GFR calc non Af Amer: 49 mL/min/{1.73_m2} — ABNORMAL LOW (ref >59)
Glucose: 99 mg/dL (ref 65–99)
Potassium: 4.5 mmol/L (ref 3.5–5.2)
Sodium: 140 mmol/L (ref 134–144)

## 2020-11-14 NOTE — Progress Notes (Signed)
Called and spoke with pt and his wife. Pts wife voiced understanding. AD/S

## 2020-12-26 ENCOUNTER — Other Ambulatory Visit: Payer: Self-pay | Admitting: Cardiology

## 2020-12-31 DIAGNOSIS — E1129 Type 2 diabetes mellitus with other diabetic kidney complication: Secondary | ICD-10-CM | POA: Diagnosis not present

## 2020-12-31 DIAGNOSIS — I712 Thoracic aortic aneurysm, without rupture: Secondary | ICD-10-CM | POA: Diagnosis not present

## 2020-12-31 DIAGNOSIS — I130 Hypertensive heart and chronic kidney disease with heart failure and stage 1 through stage 4 chronic kidney disease, or unspecified chronic kidney disease: Secondary | ICD-10-CM | POA: Diagnosis not present

## 2020-12-31 DIAGNOSIS — R809 Proteinuria, unspecified: Secondary | ICD-10-CM | POA: Diagnosis not present

## 2020-12-31 DIAGNOSIS — I5042 Chronic combined systolic (congestive) and diastolic (congestive) heart failure: Secondary | ICD-10-CM | POA: Diagnosis not present

## 2020-12-31 DIAGNOSIS — I429 Cardiomyopathy, unspecified: Secondary | ICD-10-CM | POA: Diagnosis not present

## 2020-12-31 DIAGNOSIS — N1831 Chronic kidney disease, stage 3a: Secondary | ICD-10-CM | POA: Diagnosis not present

## 2020-12-31 DIAGNOSIS — I714 Abdominal aortic aneurysm, without rupture: Secondary | ICD-10-CM | POA: Diagnosis not present

## 2020-12-31 DIAGNOSIS — I5030 Unspecified diastolic (congestive) heart failure: Secondary | ICD-10-CM | POA: Diagnosis not present

## 2021-01-14 ENCOUNTER — Other Ambulatory Visit: Payer: Medicare HMO

## 2021-01-15 ENCOUNTER — Ambulatory Visit
Admission: RE | Admit: 2021-01-15 | Discharge: 2021-01-15 | Disposition: A | Discharge status: Home / Self Care | Payer: Medicare HMO | Source: Ambulatory Visit | Attending: Cardiology | Admitting: Cardiology

## 2021-01-15 ENCOUNTER — Other Ambulatory Visit: Payer: Self-pay

## 2021-01-15 ENCOUNTER — Other Ambulatory Visit: Payer: Self-pay | Admitting: Cardiology

## 2021-01-15 DIAGNOSIS — I712 Thoracic aortic aneurysm, without rupture, unspecified: Secondary | ICD-10-CM

## 2021-01-15 DIAGNOSIS — I2510 Atherosclerotic heart disease of native coronary artery without angina pectoris: Secondary | ICD-10-CM | POA: Diagnosis not present

## 2021-01-15 DIAGNOSIS — I270 Primary pulmonary hypertension: Secondary | ICD-10-CM | POA: Diagnosis not present

## 2021-01-15 DIAGNOSIS — I10 Essential (primary) hypertension: Secondary | ICD-10-CM | POA: Diagnosis not present

## 2021-01-15 MED ORDER — IOPAMIDOL (ISOVUE-370) INJECTION 76%
75.0000 mL | Freq: Once | INTRAVENOUS | Status: AC | PRN
  Administered 2021-01-15: 75 mL via INTRAVENOUS

## 2021-01-15 NOTE — Progress Notes (Signed)
Placing referral to cardiothoracic surgery. FYI.

## 2021-01-17 ENCOUNTER — Telehealth: Payer: Self-pay

## 2021-01-23 NOTE — Telephone Encounter (Signed)
done

## 2021-02-06 ENCOUNTER — Encounter: Payer: Self-pay | Admitting: Surgery

## 2021-02-06 ENCOUNTER — Other Ambulatory Visit: Payer: Self-pay

## 2021-02-06 ENCOUNTER — Institutional Professional Consult (permissible substitution): Payer: Medicare HMO | Admitting: Surgery

## 2021-02-06 VITALS — BP 168/83 | HR 64 | Resp 20 | Ht 74.0 in

## 2021-02-06 DIAGNOSIS — I712 Thoracic aortic aneurysm, without rupture, unspecified: Secondary | ICD-10-CM

## 2021-02-08 ENCOUNTER — Other Ambulatory Visit: Payer: Self-pay

## 2021-02-08 ENCOUNTER — Encounter: Payer: Self-pay | Admitting: Podiatry

## 2021-02-08 ENCOUNTER — Ambulatory Visit: Payer: Medicare HMO | Admitting: Podiatry

## 2021-02-08 DIAGNOSIS — E119 Type 2 diabetes mellitus without complications: Secondary | ICD-10-CM | POA: Diagnosis not present

## 2021-02-08 DIAGNOSIS — M2140 Flat foot [pes planus] (acquired), unspecified foot: Secondary | ICD-10-CM

## 2021-02-08 DIAGNOSIS — M79675 Pain in left toe(s): Secondary | ICD-10-CM

## 2021-02-08 DIAGNOSIS — M79674 Pain in right toe(s): Secondary | ICD-10-CM | POA: Diagnosis not present

## 2021-02-08 DIAGNOSIS — B351 Tinea unguium: Secondary | ICD-10-CM

## 2021-02-08 NOTE — Progress Notes (Signed)
This patient returns to my office for at risk foot care.  This patient requires this care by a professional since this patient will be at risk due to having  Diabetes.  This patient is unable to cut nails himself since the patient cannot reach his nails.These nails are painful walking and wearing shoes.  This patient presents for at risk foot care today.  General Appearance  Alert, conversant and in no acute stress.  Vascular  Dorsalis pedis  Are  weakly  palpable  bilaterally.  Posterior tibial pulses are absent due to ankle swelling.  Absent pedal hair.Capillary return is within normal limits  bilaterally. Temperature is within normal limits  bilaterally.  Neurologic  Senn-Weinstein monofilament wire test within normal limits  bilaterally. Muscle power within normal limits bilaterally.  Nails Thick disfigured discolored nails with subungual debris  from hallux to fifth toes bilaterally. No evidence of bacterial infection or drainage bilaterally.  Orthopedic  No limitations of motion  feet .  No crepitus or effusions noted.  No bony pathology or digital deformities noted. HAV  B/L.  PTT tendinitis/arthritis left foot.  Pes planus  Skin  normotropic skin with no porokeratosis noted bilaterally.  No signs of infections or ulcers noted.     Onychomycosis  Pain in right toes  Pain in left toes  Consent was obtained for treatment procedures.   Mechanical debridement of nails 1-5  bilaterally performed with a nail nipper.  Filed with dremel without incident.    Return office visit   3 months                   Told patient to return for periodic foot care and evaluation due to potential at risk complications.   Gardiner Barefoot DPM

## 2021-02-09 ENCOUNTER — Encounter: Payer: Self-pay | Admitting: Surgery

## 2021-02-09 NOTE — Progress Notes (Signed)
Cardiothoracic Surgery Consultation   PCP is [name], MD Referring Provider is Nigel Mormon, MD  Chief Complaint  Patient presents with  . Thoracic Aortic Aneurysm    Initial surgical consult, CTA chest 2/15, ECHO 11/17    HPI:  The patient is a 80 year old gentleman with a history of type 2 diabetes, stage III chronic kidney disease, hypertension, hyperlipidemia, morbid obesity, OSA on CPAP, abdominal aortic aneurysm measuring 3.6 cm on recent aortic duplex, and combined chronic systolic and diastolic congestive heart failure followed by Dr. Virgina Jock.  He has a known ascending aortic aneurysm.  Echocardiogram in 09/2019 showed this to measure 4.7 cm which was an increase from 4.5 cm in 10/2018.  His most recent echocardiogram on 09/21/2020 showed the aorta to measure 4.6 cm in greatest diameter.  He had mild aortic insufficiency and mild mitral regurgitation.  Left ventricular ejection fraction was 50 to 55% with severe LVH and grade 2 diastolic dysfunction.  He underwent a CTA of the chest on 01/15/2021 which showed the ascending aorta to measure 5 x 4.9 cm with the sinuses of Valsalva measuring up to 4.7 cm.  The descending aorta measured 3.3 cm.   He is here today with his wife and daughter.  He denies any chest or back pain.  He denies shortness of breath.  There is no family history of aortic aneurysm disease, connective tissue disorder, or aortic dissection.  Past Medical History:  Diagnosis Date  . AAA (abdominal aortic aneurysm) without rupture [facility])    followed by dr Derek Jack--  per office note dated 11-05-2018  3.5cm  . Benign essential HTN    cardiologist-- dr Margart Sickles  . Chronic combined systolic (congestive) and diastolic (congestive) heart failure [facility])    cardiologist-- dr Robb Matar  . CKD (chronic kidney disease), stage III ([place])   . Diabetes mellitus type 2, insulin dependent ([place])    followed by dr Virgina Jock  . Diabetic neuropathy ([place])   . DOE  (dyspnea on exertion)   . Edema of both lower extremities   . Full dentures   . HOH (hard of hearing)    does wear hearing aids  . Hydrocele, bilateral   . Lichen planus   . Mixed hyperlipidemia   . Morbid obesity with BMI of 45.0-49.9, adult ([place])   . Nocturia   . OA (osteoarthritis)   . OSA on CPAP 10/14/08   per study  AHI  61.8/hr ,  RDI  96.1/hr.     Past Surgical History:  Procedure Laterality Date  . CARDIAC CATHETERIZATION  per pt yrs ago   was told no blockage  . CARDIOVASCULAR STRESS TEST  07-19-2014   dr Shelva Majestic   Low risk nuclear study w/ small apical attentuation artifact/  normal LV function and wall motion , ef 53%  . CATARACT EXTRACTION W/ INTRAOCULAR LENS  IMPLANT, BILATERAL  2018  . COLONOSCOPY W/ BIOPSIES AND POLYPECTOMY    . COLONOSCOPY WITH PROPOFOL N/A 05/16/2016   Procedure: COLONOSCOPY WITH PROPOFOL;  Surgeon: Carol Ada, MD;  Location: [facility];  Service: Endoscopy;  Laterality: N/A;  . HYDROCELE EXCISION Bilateral 11/15/2018   Procedure: HYDROCELECTOMY ADULT;  Surgeon: Kathie Rhodes, MD;  Location: [facility];  Service: Urology;  Laterality: Bilateral;  . KNEE ARTHROSCOPY Right 2009  . MULTIPLE TOOTH EXTRACTIONS    . ORIF ANKLE FRACTURE Left 09/13/2014   Procedure: Open Reduction Internal Fixation Left Ankle Medial Malleolus;  Surgeon: Newt Minion, MD;  Location: [facility]  OR;  Service: Orthopedics;  Laterality: Left;  . PROSTATECTOMY  1993 approx.  by dr Diona Fanti   for prostate enlargement  . TOTAL KNEE ARTHROPLASTY Right 08/27/2009   dr Noemi Chapel @[facility]   . TOTAL KNEE ARTHROPLASTY  12/20/2012   Procedure: TOTAL KNEE ARTHROPLASTY;  Surgeon: Lorn Junes, MD;  Location: [place] [facility];  Service: Orthopedics;  Laterality: Left;  left total knee arthroplasty  . TRANSTHORACIC ECHOCARDIOGRAM  11-04-2018   dr patwardhan   moderate to severe concentric LVH, ef 56%,  grade 1 diastolic dysfunciton/  mild AV calcification annulus and leaflets without  stentosis ,  mild regurg./  moderate LAE/ dilated aortic root, 4.5cm/  mild MV annulus calcification with trace regurg./  trace TR    Family History  Problem Relation Age of Onset  . Hypertension Mother   . Diabetes Mellitus II Mother   . Heart attack Father   . Diabetes Mellitus II Sister   . Heart disease Sister   . Diabetes Mellitus II Brother   . Stroke Brother   . Diabetes Mellitus II Brother   . Kidney disease Brother   . Diabetes Mellitus II Brother   . Diabetes Mellitus II Sister   . Heart disease Sister   . Hypertension Other   . Diabetes Mellitus II Other   . Stroke Other   . Heart disease Other     Social History Social History   Tobacco Use  . Smoking status: Former Smoker    Packs/day: 1.50    Years: 10.00    Pack years: 15.00    Types: Cigarettes    Quit date: 12/14/1971    Years since quitting: 49.1  . Smokeless tobacco: Former Systems developer    Types: Snuff, Chew  Vaping Use  . Vaping Use: Never used  Substance Use Topics  . Alcohol use: Yes    Alcohol/week: 1.0 standard drink    Types: 1 Standard drinks or equivalent per week    Comment: one shot qhs  . Drug use: No    Current Outpatient Medications  Medication Sig Dispense Refill  . aspirin EC 81 MG tablet Take 81 mg by mouth daily.    Marland Kitchen BIDIL 20-37.5 MG tablet TAKE 2 TABLETS BY MOUTH THREE TIMES A DAY 540 tablet 3  . carvedilol (COREG) 25 MG tablet Take 2 tablets (50 mg total) by mouth 2 (two) times daily with a meal. 180 tablet 3  . clobetasol ointment (TEMOVATE) 9.56 % Apply 1 application topically daily as needed (for itching).     . Cyanocobalamin 2500 MCG TABS Take 500 mcg by mouth daily.     . diclofenac sodium (VOLTAREN) 1 % GEL Apply 1 application topically 4 (four) times daily as needed (For pain.).    Marland Kitchen Dulaglutide 1.5 MG/0.5ML SOPN Inject 1 Dose into the skin once a week. Wednesday's    . felodipine (PLENDIL) 5 MG 24 hr tablet Take 5 mg by mouth 2 (two) times daily.     . fluticasone  (CUTIVATE) 0.005 % ointment Apply 1 application topically 2 (two) times daily.    . furosemide (LASIX) 20 MG tablet TAKE ONE TABLET BY MOUTH ONE TIME DAILY *take 1 tablet twice daily for 3 to 5 days if increase leg swelling or shortness of breath occurs* (Patient taking differently: Take 40 mg by mouth daily.) 90 tablet 0  . HYDROcodone-acetaminophen (NORCO/VICODIN) 5-325 MG tablet 2 (two) times daily.    Marland Kitchen loratadine (CLARITIN) 10 MG tablet Take 10 mg by mouth every morning.     Marland Kitchen  losartan (COZAAR) 100 MG tablet Take 100 mg by mouth every evening.     Marland Kitchen MEGARED OMEGA-3 KRILL OIL PO Take 1 tablet by mouth daily.    . metFORMIN (GLUCOPHAGE) 1000 MG tablet Take 1,000 mg by mouth 2 (two) times daily with a meal.    . Multiple Vitamin (MULTIVITAMIN WITH MINERALS) TABS tablet Take 1 tablet by mouth daily.    Glory Rosebush VERIO test strip USE TO TEST BID AS DIRECTED  2  . Polyethyl Glycol-Propyl Glycol (SYSTANE OP) Apply to eye as needed.    . potassium chloride SA (K-DUR) 20 MEQ tablet     . simvastatin (ZOCOR) 40 MG tablet Take 40 mg by mouth every evening.    Marland Kitchen spironolactone (ALDACTONE) 50 MG tablet Take 1 tablet by mouth daily.    . Turmeric 400 MG CAPS Take by mouth 2 (two) times daily.    . urea (CARMOL) 10 % cream Apply topically as needed.     No current facility-administered medications for this visit.    No Known Allergies  Review of Systems  Constitutional: Negative.   HENT:       Dentures  Eyes:       Blurry vision and floaters  Respiratory: Positive for apnea, cough and wheezing. Negative for shortness of breath.        Order CPAP at night  Cardiovascular: Negative for chest pain, palpitations and leg swelling.  Gastrointestinal: Positive for constipation and diarrhea.  Endocrine: Negative.   Genitourinary: Negative.   Musculoskeletal: Positive for arthralgias.  Allergic/Immunologic: Negative.   Neurological: Negative.   Hematological: Negative.   Psychiatric/Behavioral:  Negative.     BP (!) 168/83 (BP Location: Right Arm, Patient Position: Sitting)   Pulse 64   Resp 20   Ht 6\' 2"  (1.54 m)   SpO2 93% Comment: RA  BMI 38.90 kg/m  Physical Exam Constitutional:      Appearance: Normal appearance.  HENT:     Head: Normocephalic and atraumatic.  Eyes:     Extraocular Movements: Extraocular movements intact.     Conjunctiva/sclera: Conjunctivae normal.     Pupils: Pupils are equal, round, and reactive to light.  Neck:     Vascular: No carotid bruit.  Cardiovascular:     Rate and Rhythm: Normal rate and regular rhythm.     Heart sounds: Normal heart sounds. No murmur heard.   Pulmonary:     Effort: Pulmonary effort is normal.     Breath sounds: Normal breath sounds.  Musculoskeletal:        General: No swelling.     Cervical back: Normal range of motion.  Neurological:     General: No focal deficit present.     Mental Status: He is alert and oriented to person, place, and time.  Psychiatric:        Mood and Affect: Mood normal.        Behavior: Behavior normal.      Diagnostic Tests:  Narrative & Impression  CLINICAL DATA:  Follow-up TAA identified by echocardiogram  EXAM: CT ANGIOGRAPHY CHEST WITH CONTRAST  TECHNIQUE: Multidetector CT imaging of the chest was performed using the standard protocol during bolus administration of intravenous contrast. Multiplanar CT image reconstructions and MIPs were obtained to evaluate the vascular anatomy.  CONTRAST:  59mL ISOVUE-370 IOPAMIDOL (ISOVUE-370) INJECTION 76%  COMPARISON:  None.  FINDINGS: Cardiovascular: Preferential opacification of the thoracic aorta. Scattered aortic atherosclerosis. Enlargement of the tubular ascending thoracic aorta measuring up to 5.0 x  4.9 cm. The sinuses of Valsalva measure up to 4.7 cm. The aortic valve measures up to 2.8 cm. Enlargement of the descending thoracic aorta measuring up to 3.3 x 3.2 cm. Normal heart size. Left coronary  artery calcifications. Gross enlargement of the main pulmonary artery measuring up to 4.5 cm in caliber. No pericardial effusion.  Mediastinum/Nodes: Prominent mediastinal and hilar lymph nodes. Thyroid gland, trachea, and esophagus demonstrate no significant findings.  Lungs/Pleura: Elevation of the right hemidiaphragm with associated scarring or atelectasis of the right lower and middle lobes. No pleural effusion or pneumothorax.  Upper Abdomen: No acute abnormality. Gallstones near the gallbladder neck.  Musculoskeletal: No chest wall abnormality. No acute or significant osseous findings.  Review of the MIP images confirms the above findings.  IMPRESSION: 1. 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 Enlargement of the descending thoracic aorta measuring up to 3.3 x 3.2 cm. Ascending thoracic aortic aneurysm. Recommend semi-annual imaging followup by CTA or MRA and referral to cardiothoracic surgery if not already obtained. This recommendation follows 2010 ACCF/AHA/AATS/ACR/ASA/SCA/SCAI/SIR/STS/SVM Guidelines for the Diagnosis and Management of Patients With Thoracic Aortic Disease. Circulation. 2010; 121: I347-Q259. Aortic aneurysm NOS (ICD10-I71.9) 2. Elevation of the right hemidiaphragm with associated scarring or atelectasis of the right lower and middle lobes. 3. Coronary artery disease. 4. Enlargement of the main pulmonary artery as can be seen in pulmonary hypertension. 5. Cholelithiasis.  Aortic Atherosclerosis (ICD10-I70.0).   Electronically Signed   By: Eddie Candle M.D.   On: 01/15/2021 16:24     Impression:  This 80 year old gentleman has a 5 x 4.9 cm fusiform ascending aortic aneurysm.  This was measured at 4.6 cm on 2D echocardiogram with mild aortic insufficiency.  He has severe LVH likely related to poorly controlled hypertension.  Review of his previous study showed that he had a CT scan of the chest  without contrast in 2010.  I reviewed the images and I measure the ascending aorta to have a maximum diameter of 4.8 cm at that time.  There has not been much change since 2010 and probably within the margin of error.  His aneurysm is still below the usual surgical threshold of 5.5 cm and given his advanced age and severe LVH I think it would be best to continue following this.  I stressed the importance of good blood pressure control in preventing further enlargement and acute aortic dissection.  I reviewed the CT images with the patient and his family and answered their questions.  Plan:  He will return to see me in 1 year with a CTA of the chest.  He will continue to follow-up with Dr. Virgina Jock for his cardiology care.  I spent 60 minutes performing this consultation and > 50% of this time was spent face to face counseling and coordinating the care of this patient's ascending aortic aneurysm.   Gaye Pollack, MD [facility] [phone]

## 2021-02-19 DIAGNOSIS — H353221 Exudative age-related macular degeneration, left eye, with active choroidal neovascularization: Secondary | ICD-10-CM | POA: Diagnosis not present

## 2021-02-19 DIAGNOSIS — E119 Type 2 diabetes mellitus without complications: Secondary | ICD-10-CM | POA: Diagnosis not present

## 2021-02-19 DIAGNOSIS — H40013 Open angle with borderline findings, low risk, bilateral: Secondary | ICD-10-CM | POA: Diagnosis not present

## 2021-02-19 DIAGNOSIS — Z961 Presence of intraocular lens: Secondary | ICD-10-CM | POA: Diagnosis not present

## 2021-02-19 DIAGNOSIS — H524 Presbyopia: Secondary | ICD-10-CM | POA: Diagnosis not present

## 2021-04-02 ENCOUNTER — Other Ambulatory Visit: Payer: Self-pay

## 2021-04-02 ENCOUNTER — Encounter: Payer: Self-pay | Admitting: Cardiovascular Disease

## 2021-04-02 ENCOUNTER — Ambulatory Visit: Payer: Medicare HMO | Admitting: Cardiovascular Disease

## 2021-04-02 VITALS — BP 150/73 | HR 74 | Ht 72.0 in | Wt 304.0 lb

## 2021-04-02 DIAGNOSIS — G4733 Obstructive sleep apnea (adult) (pediatric): Secondary | ICD-10-CM

## 2021-04-02 DIAGNOSIS — I5020 Unspecified systolic (congestive) heart failure: Secondary | ICD-10-CM

## 2021-04-02 DIAGNOSIS — E118 Type 2 diabetes mellitus with unspecified complications: Secondary | ICD-10-CM

## 2021-04-02 DIAGNOSIS — I10 Essential (primary) hypertension: Secondary | ICD-10-CM | POA: Diagnosis not present

## 2021-04-02 DIAGNOSIS — I5189 Other ill-defined heart diseases: Secondary | ICD-10-CM

## 2021-04-02 NOTE — Patient Instructions (Signed)
Medication Instructions:  Continue same medications    Lab Work: None ordered   Testing/Procedures: None ordered   Follow-Up: At [facility], you and your health needs are our priority.  As part of our continuing mission to provide you with exceptional heart care, we have created designated Provider Care Teams.  These Care Teams include your primary Cardiologist (physician) and Advanced Practice Providers (APPs -  Physician Assistants and Nurse Practitioners) who all work together to provide you with the care you need, when you need it.  We recommend signing up for the patient portal called "MyChart".  Sign up information is provided on this After Visit Summary.  MyChart is used to connect with patients for Virtual Visits (Telemedicine).  Patients are able to view lab/test results, encounter notes, upcoming appointments, etc.  Non-urgent messages can be sent to your provider as well.   To learn more about what you can do with MyChart, go to [email or web address].    Your next appointment:  4 to 5 months Sleep Clinic    The format for your next appointment:  Office     Provider:  [facility]

## 2021-04-02 NOTE — Progress Notes (Signed)
Cardiology Office Note    Date:  04/04/2021   ID:  Bradley Alvarez, DOB 1941-08-25, MRN [id number]  PCP:  [name], MD  Cardiologist:  Shelva Majestic, MD (sleep); Dr. Virgina Jock  Reestablishment of care  History of Present Illness:  Bradley Alvarez is a 80 y.o. male who is a former cardiology and sleep patient and currently sees Dr. Earnie Larsson for his cardiology care.  The patient has a history of sleep apnea.  I last saw him in March 2018.  He has recently had issues with his CPAP machine and presents for evaluation.  Bradley Alvarez has a history of hypertension, type 2 diabetes mellitus, mixed hyperlipidemia, morbid obesity, diastolic dysfunction, and remotely was diagnosed with severe sleep apnea.  His initial sleep study was done in November 2009 at the [facility].  He was found to have severe sleep apnea with an AHI of 61.8/h and an RDI of 96.1/h.  He was unable to achieve any REM sleep on the diagnostic polysomnogram.  He underwent a CPAP titration study on November 01, 2008 at which time CPAP was titrated up to 12 cm water pressure.  AHI at 11 was 0..  He has been on CPAP therapy since 2009.  He received a new ResMed air sense 10 AutoSet CPAP unit in 2015.  Throughout this time he has consistently been compliant with CPAP therapy and "cannot sleep without it.  When I last saw the patient in March 2018 he was 100% compliant and his 95th percentile pressure was 8.5 with a maximum average pressure of 9.8.  AHI was excellent at 1.6 and he was averaging 6 hours and 22 minutes of CPAP use per night.  Recently, Bradley Alvarez has been receiving a message on his CPAP machine that the machine is approaching end-of-life.  In addition, his CPAP machine from 2015 is now no longer downloadable and data cannot be retrieved since March 05, 2021 due to upgrade and Internet.  I was able to obtain a download through the end of March 2022 and over the month of March usage was 100% which average use  at 7 hours and 48 minutes.  His AutoSet was set at a pressure range of 5 to 20 cm and his 95th percentile pressure was 8.3 with a maximum average pressure of 9.7.  [employer] [facility] is his DME company.  Due to his need for new CPAP machine he presents to reestablish sleep care with me.  Presently, he denies any chest pain or shortness of breath.  An echo Doppler study in October 2021 showed an EF of 50 to 55% with grade 2 diastolic this function and severe left ventricular hypertrophy.  There was moderate dilation of his left atrium and mild mitral and aortic regurgitation.  He is on a medical regimen consisting of BiDil 20/37.5 mg 2 tablets 3 times a day, carvedilol 50 mg twice daily, furosemide 20 mg daily, losartan 100 mg daily in addition to felodipine 5 mg and spironolactone 50 mg daily for blood pressure control.  He is on simvastatin 40 mg for hyperlipidemia.  He is diabetic on metformin.  He continues to have morbid obesity with a BMI of 41.2 and weight 304 pounds.  He presents for evaluation.   Past Medical History:  Diagnosis Date  . AAA (abdominal aortic aneurysm) without rupture [facility])    followed by dr Derek Jack--  per office note dated 11-05-2018  3.5cm  . Benign essential HTN  cardiologist-- dr Margart Sickles  . Chronic combined systolic (congestive) and diastolic (congestive) heart failure [facility])    cardiologist-- dr Robb Matar  . CKD (chronic kidney disease), stage III ([place])   . Diabetes mellitus type 2, insulin dependent ([place])    followed by dr Virgina Jock  . Diabetic neuropathy ([place])   . DOE (dyspnea on exertion)   . Edema of both lower extremities   . Full dentures   . HOH (hard of hearing)    does wear hearing aids  . Hydrocele, bilateral   . Lichen planus   . Mixed hyperlipidemia   . Morbid obesity with BMI of 45.0-49.9, adult ([place])   . Nocturia   . OA (osteoarthritis)   . OSA on CPAP 10/14/08   per study  AHI  61.8/hr ,  RDI  96.1/hr.     Past Surgical History:   Procedure Laterality Date  . CARDIAC CATHETERIZATION  per pt yrs ago   was told no blockage  . CARDIOVASCULAR STRESS TEST  07-19-2014   dr Shelva Majestic   Low risk nuclear study w/ small apical attentuation artifact/  normal LV function and wall motion , ef 53%  . CATARACT EXTRACTION W/ INTRAOCULAR LENS  IMPLANT, BILATERAL  2018  . COLONOSCOPY W/ BIOPSIES AND POLYPECTOMY    . COLONOSCOPY WITH PROPOFOL N/A 05/16/2016   Procedure: COLONOSCOPY WITH PROPOFOL;  Surgeon: Carol Ada, MD;  Location: [facility];  Service: Endoscopy;  Laterality: N/A;  . HYDROCELE EXCISION Bilateral 11/15/2018   Procedure: HYDROCELECTOMY ADULT;  Surgeon: Kathie Rhodes, MD;  Location: [facility];  Service: Urology;  Laterality: Bilateral;  . KNEE ARTHROSCOPY Right 2009  . MULTIPLE TOOTH EXTRACTIONS    . ORIF ANKLE FRACTURE Left 09/13/2014   Procedure: Open Reduction Internal Fixation Left Ankle Medial Malleolus;  Surgeon: Newt Minion, MD;  Location: [place];  Service: Orthopedics;  Laterality: Left;  . PROSTATECTOMY  1993 approx.  by dr Diona Fanti   for prostate enlargement  . TOTAL KNEE ARTHROPLASTY Right 08/27/2009   dr Noemi Chapel '@[facility]'   . TOTAL KNEE ARTHROPLASTY  12/20/2012   Procedure: TOTAL KNEE ARTHROPLASTY;  Surgeon: Lorn Junes, MD;  Location: [place];  Service: Orthopedics;  Laterality: Left;  left total knee arthroplasty  . TRANSTHORACIC ECHOCARDIOGRAM  11-04-2018   dr patwardhan   moderate to severe concentric LVH, ef 56%,  grade 1 diastolic dysfunciton/  mild AV calcification annulus and leaflets without stentosis ,  mild regurg./  moderate LAE/ dilated aortic root, 4.5cm/  mild MV annulus calcification with trace regurg./  trace TR    Current Medications: Outpatient Medications Prior to Visit  Medication Sig Dispense Refill  . aspirin EC 81 MG tablet Take 81 mg by mouth daily.    Marland Kitchen BIDIL 20-37.5 MG tablet TAKE 2 TABLETS BY MOUTH THREE TIMES A DAY 540 tablet 3  . carvedilol (COREG) 25 MG  tablet Take 2 tablets (50 mg total) by mouth 2 (two) times daily with a meal. 180 tablet 3  . clobetasol ointment (TEMOVATE) 4.68 % Apply 1 application topically daily as needed (for itching).     . Cyanocobalamin 2500 MCG TABS Take 500 mcg by mouth daily.     . diclofenac sodium (VOLTAREN) 1 % GEL Apply 1 application topically 4 (four) times daily as needed (For pain.).    Marland Kitchen Dulaglutide 1.5 MG/0.5ML SOPN Inject 1 Dose into the skin once a week. Wednesday's    . felodipine (PLENDIL) 5 MG 24 hr tablet Take 5 mg  by mouth 2 (two) times daily.     . fluticasone (CUTIVATE) 0.005 % ointment Apply 1 application topically 2 (two) times daily.    . furosemide (LASIX) 20 MG tablet TAKE ONE TABLET BY MOUTH ONE TIME DAILY *take 1 tablet twice daily for 3 to 5 days if increase leg swelling or shortness of breath occurs* (Patient taking differently: Take 40 mg by mouth daily.) 90 tablet 0  . HYDROcodone-acetaminophen (NORCO/VICODIN) 5-325 MG tablet 2 (two) times daily.    Marland Kitchen loratadine (CLARITIN) 10 MG tablet Take 10 mg by mouth every morning.     Marland Kitchen losartan (COZAAR) 100 MG tablet Take 100 mg by mouth every evening.     Marland Kitchen MEGARED OMEGA-3 KRILL OIL PO Take 1 tablet by mouth daily.    . metFORMIN (GLUCOPHAGE) 1000 MG tablet Take 1,000 mg by mouth 2 (two) times daily with a meal.    . Multiple Vitamin (MULTIVITAMIN WITH MINERALS) TABS tablet Take 1 tablet by mouth daily.    Glory Rosebush VERIO test strip USE TO TEST BID AS DIRECTED  2  . Polyethyl Glycol-Propyl Glycol (SYSTANE OP) Apply to eye as needed.    . potassium chloride SA (K-DUR) 20 MEQ tablet     . simvastatin (ZOCOR) 40 MG tablet Take 40 mg by mouth every evening.    Marland Kitchen spironolactone (ALDACTONE) 50 MG tablet Take 1 tablet by mouth daily.    . Turmeric 400 MG CAPS Take by mouth 2 (two) times daily.    . urea (CARMOL) 10 % cream Apply topically as needed.     No facility-administered medications prior to visit.     Allergies:   Patient has no known  allergies.   Social History   Socioeconomic History  . Marital status: Married    Spouse name: Not on file  . Number of children: 5  . Years of education: Not on file  . Highest education level: Not on file  Occupational History  . Not on file  Tobacco Use  . Smoking status: Former Smoker    Packs/day: 1.50    Years: 10.00    Pack years: 15.00    Types: Cigarettes    Quit date: 12/14/1971    Years since quitting: 49.3  . Smokeless tobacco: Former Systems developer    Types: Snuff, Chew  Vaping Use  . Vaping Use: Never used  Substance and Sexual Activity  . Alcohol use: Yes    Alcohol/week: 1.0 standard drink    Types: 1 Standard drinks or equivalent per week    Comment: one shot qhs  . Drug use: No  . Sexual activity: Not on file  Other Topics Concern  . Not on file  Social History Narrative  . Not on file   Social Determinants of Health   Financial Resource Strain: Not on file  Food Insecurity: Not on file  Transportation Needs: Not on file  Physical Activity: Not on file  Stress: Not on file  Social Connections: Not on file    Socially he is married and has 5 children.  He does not exercise routinely.  There is remote tobacco history.  There is no alcohol.  Family History:  The patient's family history includes Diabetes Mellitus II in his brother, brother, brother, mother, sister, sister, and another family member; Heart attack in his father; Heart disease in his sister, sister and another family member; Hypertension in his mother and another family member; Kidney disease in his brother; Stroke in his brother and  another family member.   ROS General: Negative; No fevers, chills, or night sweats;  HEENT: Negative; No changes in vision or hearing, sinus congestion, difficulty swallowing Pulmonary: Negative; No cough, wheezing, shortness of breath, hemoptysis Cardiovascular: Positive for hypertension GI: Negative; No nausea, vomiting, diarrhea, or abdominal pain GU: Negative;  No dysuria, hematuria, or difficulty voiding Musculoskeletal: Negative; no myalgias, joint pain, or weakness Hematologic/Oncology: Negative; no easy bruising, bleeding Endocrine: Positive for diabetes mellitus Neuro: Negative; no changes in balance, headaches Skin: Negative; No rashes or skin lesions Psychiatric: Negative; No behavioral problems, depression Sleep: Positive for obstructive sleep apnea, severe.  "He cannot sleep without it."  No breakthrough snoring, bruxism, restless legs, hypnogognic hallucinations, no cataplexy Other comprehensive 14 point system review is negative.   PHYSICAL EXAM:   VS:  BP (!) 150/73   Pulse 74   Ht 6' (1.829 m)   Wt (!) 304 lb (137.9 kg)   SpO2 91%   BMI 41.23 kg/m     Repeat blood pressure by me was 140/78  Wt Readings from Last 3 Encounters:  04/02/21 (!) 304 lb (137.9 kg)  11/01/20 (!) 303 lb (137.4 kg)  05/03/20 (!) 315 lb (142.9 kg)    General: Alert, oriented, no distress.  Skin: normal turgor, no rashes, warm and dry; bearded HEENT: Normocephalic, atraumatic. Pupils equal round and reactive to light; sclera anicteric; extraocular muscles intact;  Nose without nasal septal hypertrophy Mouth/Parynx benign; Mallinpatti scale 3/4 Neck: No JVD, no carotid bruits; normal carotid upstroke Lungs: clear to ausculatation and percussion; no wheezing or rales Chest wall: without tenderness to palpitation Heart: PMI not displaced, RRR, s1 s2 normal, 1/6 systolic murmur, no diastolic murmur, no rubs, gallops, thrills, or heaves Abdomen: Moderate central adiposity with diastases recti soft, nontender; no hepatosplenomehaly, BS+; abdominal aorta nontender and not dilated by palpation. Back: no CVA tenderness Pulses 2+ Musculoskeletal: full range of motion, normal strength, no joint deformities Extremities: Mild edema; no clubbing cyanosis, Homan's sign negative  Neurologic: grossly nonfocal; Cranial nerves grossly wnl Psychologic: Normal mood  and affect   Studies/Labs Reviewed:   EKG:  EKG is ordered today.  Normal sinus rhythm at 74 bpm.  No ectopy.  QTc interval 470  Recent Labs: BMP Latest Ref Rng & Units 11/09/2020 11/15/2018 09/10/2017  Glucose 65 - 99 mg/dL 99 140(H) 118(H)  BUN 8 - 27 mg/dL 16 19 7(L)  Creatinine 0.76 - 1.27 mg/dL 1.36(H) 1.70(H) 1.15  BUN/Creat Ratio 10 - 24 12 - 6(L)  Sodium 134 - 144 mmol/L 140 138 143  Potassium 3.5 - 5.2 mmol/L 4.5 4.3 3.8  Chloride 96 - 106 mmol/L 99 102 98  CO2 20 - 29 mmol/L 24 - 29  Calcium 8.6 - 10.2 mg/dL 9.6 - 9.2     Hepatic Function Latest Ref Rng & Units 09/10/2017 05/24/2016 05/17/2016  Total Protein 6.0 - 8.5 g/dL 7.5 - 8.0  Albumin 3.5 - 4.8 g/dL 4.4 3.4(L) 4.5  AST 0 - 40 IU/L 25 - 27  ALT 0 - 44 IU/L 16 - 22  Alk Phosphatase 39 - 117 IU/L 46 - 40  Total Bilirubin 0.0 - 1.2 mg/dL 0.6 - 0.9    CBC Latest Ref Rng & Units 11/15/2018 09/10/2017 05/25/2016  WBC 3.4 - 10.8 x10E3/uL - 5.7 10.1  Hemoglobin 13.0 - 17.0 g/dL 14.3 14.1 10.7(L)  Hematocrit 39.0 - 52.0 % 42.0 42.3 31.1(L)  Platelets 150 - 379 x10E3/uL - 141(L) 250   Lab Results  Component Value Date  MCV 81 09/10/2017   MCV 76.6 (L) 05/25/2016   MCV 76.5 (L) 05/23/2016   Lab Results  Component Value Date   TSH 2.980 09/10/2017   Lab Results  Component Value Date   HGBA1C 6.6 (H) 09/10/2017     BNP No results found for: BNP  ProBNP No results found for: PROBNP   Lipid Panel     Component Value Date/Time   CHOL 136 09/10/2017 1056   TRIG 91 09/10/2017 1056   HDL 63 09/10/2017 1056   CHOLHDL 2.2 09/10/2017 1056   CHOLHDL 2.6 10/29/2015 0836   VLDL 27 10/29/2015 0836   LDLCALC 55 09/10/2017 1056   LABVLDL 18 09/10/2017 1056     RADIOLOGY: No results found.   Additional studies/ records that were reviewed today include:  I reviewed the [facility] and Sleep evaluations from 2009.  Prior cardiac evaluations by me were reviewed as well as more recent evaluations from  [facility].  ASSESSMENT:    1. OSA (obstructive sleep apnea)   2. Essential hypertension   3. HFrEF (heart failure with reduced ejection fraction) ([place])   4. Morbid obesity ([place])   5. Grade II diastolic dysfunction   6. Type 2 diabetes mellitus with complication, without long-term current use of insulin [facility])    PLAN:  Bradley Alvarez is a 80 year old gentleman who has a history of morbid obesity, hypertension, type 2 diabetes mellitus, diastolic dysfunction, heart failure with preserved ejection fraction, mild renal insufficiency, and documented severe obstructive sleep apnea.  His initial sleep evaluation was in 2009 and he has been on CPAP therapy ever since.  He received a new ResMed air sense 10 CPAP auto unit in 2015.  His unit is now malfunctioning.  He has received a warning message on his machine.  Also, since March 05, 2021 his machine is no longer downloadable due to the recent upgrade and Medical sales representative.  In February 2011 a new ResMed air sense 11 CPAP machine came to market.  His most recent download through March 2022 confirms continued excellent compliance with his CPAP therapy.  [facility] will [facility] following this face-to-face evaluation and schedule him for his new CPAP air sense 11 auto therapy.  His most recent download demonstrates a 95th percentile pressure at 8.3 with a maximum average pressure of 9.7.  We will set him up with a pressure range of 7 to 16 cm of water with EPR of 3 with ramp start pressure at 6 and ramp time at 5 minutes.  His blood pressure today by me was mildly elevated systolically.  He is now followed by Dr. Virgina Jock for his cardiology care.  I discussed with him optimal blood pressure less than 120/80 with stage I hypertension commencing at 130/80 and stage II hypertension at 140/90.  For every 20/10 increase there is a potential doubling of cardiovascular risk.  I discussed the importance of weight loss with his continued morbid  obesity at 41.2 BMI.  Currently there is a back order for new CPAP machines.  I will need to see him within 90 days following his new machine set up date for follow-up evaluation.  He will return to Dr. Virgina Jock for follow-up cardiology evaluation and his primary physician, Dr. Virgina Jock   Medication Adjustments/Labs and Tests Ordered: Current medicines are reviewed at length with the patient today.  Concerns regarding medicines are outlined above.  Medication changes, Labs and Tests ordered today are listed in the Patient Instructions below. Patient  Instructions  Medication Instructions:  Continue same medications    Lab Work: None ordered   Testing/Procedures: None ordered   Follow-Up: At [facility], you and your health needs are our priority.  As part of our continuing mission to provide you with exceptional heart care, we have created designated Provider Care Teams.  These Care Teams include your primary Cardiologist (physician) and Advanced Practice Providers (APPs -  Physician Assistants and Nurse Practitioners) who all work together to provide you with the care you need, when you need it.  We recommend signing up for the patient portal called "MyChart".  Sign up information is provided on this After Visit Summary.  MyChart is used to connect with patients for Virtual Visits (Telemedicine).  Patients are able to view lab/test results, encounter notes, upcoming appointments, etc.  Non-urgent messages can be sent to your provider as well.   To learn more about what you can do with MyChart, go to [email or web address].    Your next appointment:  4 to 5 months Sleep Clinic    The format for your next appointment:  Office     Provider:  Dr.Alanny Rivers      Signed, Shelva Majestic, MD  04/04/2021 11:02 AM    [facility] [address] [place], [place], [place]  [address] Phone: [phone]

## 2021-04-15 ENCOUNTER — Other Ambulatory Visit: Payer: Self-pay | Admitting: Cardiology

## 2021-04-30 DIAGNOSIS — E785 Hyperlipidemia, unspecified: Secondary | ICD-10-CM | POA: Diagnosis not present

## 2021-04-30 DIAGNOSIS — E1129 Type 2 diabetes mellitus with other diabetic kidney complication: Secondary | ICD-10-CM | POA: Diagnosis not present

## 2021-04-30 DIAGNOSIS — Z125 Encounter for screening for malignant neoplasm of prostate: Secondary | ICD-10-CM | POA: Diagnosis not present

## 2021-05-02 ENCOUNTER — Encounter: Payer: Self-pay | Admitting: Cardiology

## 2021-05-02 ENCOUNTER — Ambulatory Visit: Payer: Medicare HMO | Admitting: Cardiology

## 2021-05-02 ENCOUNTER — Other Ambulatory Visit: Payer: Self-pay

## 2021-05-02 VITALS — BP 155/85 | HR 66 | Temp 98.6°F | Resp 16 | Ht 72.0 in | Wt 307.0 lb

## 2021-05-02 DIAGNOSIS — I712 Thoracic aortic aneurysm, without rupture, unspecified: Secondary | ICD-10-CM

## 2021-05-02 DIAGNOSIS — G4733 Obstructive sleep apnea (adult) (pediatric): Secondary | ICD-10-CM | POA: Diagnosis not present

## 2021-05-02 DIAGNOSIS — I2510 Atherosclerotic heart disease of native coronary artery without angina pectoris: Secondary | ICD-10-CM

## 2021-05-02 DIAGNOSIS — I714 Abdominal aortic aneurysm, without rupture, unspecified: Secondary | ICD-10-CM

## 2021-05-02 DIAGNOSIS — I10 Essential (primary) hypertension: Secondary | ICD-10-CM | POA: Diagnosis not present

## 2021-05-02 NOTE — Progress Notes (Signed)
Follow up visit  Subjective:   Bradley Alvarez, male    DOB: [birth year], 80 y.o.   MRN: [id number]   HPI   Chief Complaint  Patient presents with  . Hypertension  . AAA  . Follow-up    6 month  . Thoracic aortic aneurysm without rupture     80 y.o. African-American male  with uncontrolled hypertension, uncontrolled type II diabetes mellitus, CKD stage 2/3, OSA on CPAP, heart failure with preserved ejection fraction.  Patient is doing well, denies chest pain. Exertional dyspnea is mild and unchanged. Leg edema has resolved. Blood pressure slightly elevated today.  He saw Dr. Cyndia Bent in 01/2021, who recommended continued BP control and surveillance for TAA.  Current Outpatient Medications on File Prior to Visit  Medication Sig Dispense Refill  . aspirin EC 81 MG tablet Take 81 mg by mouth daily.    Marland Kitchen BIDIL 20-37.5 MG tablet TAKE 2 TABLETS BY MOUTH THREE TIMES A DAY 540 tablet 3  . carvedilol (COREG) 25 MG tablet Take 2 tablets (50 mg total) by mouth 2 (two) times daily with a meal. 180 tablet 3  . clobetasol ointment (TEMOVATE) 5.59 % Apply 1 application topically daily as needed (for itching).     . Cyanocobalamin 2500 MCG TABS Take 500 mcg by mouth daily.     . diclofenac sodium (VOLTAREN) 1 % GEL Apply 1 application topically 4 (four) times daily as needed (For pain.).    Marland Kitchen Dulaglutide 1.5 MG/0.5ML SOPN Inject 1 Dose into the skin once a week. Wednesday's    . felodipine (PLENDIL) 5 MG 24 hr tablet Take 5 mg by mouth 2 (two) times daily.     . fluticasone (CUTIVATE) 0.005 % ointment Apply 1 application topically 2 (two) times daily.    . furosemide (LASIX) 20 MG tablet TAKE ONE TABLET BY MOUTH ONE TIME DAILY *TAKE 1 TAB TWICE DAILY FOR 3-5 DAYS IF INCREASED LEG SWELLING OR SHORTNESS OF BREATH* 90 tablet 0  . HYDROcodone-acetaminophen (NORCO/VICODIN) 5-325 MG tablet 2 (two) times daily.    Marland Kitchen loratadine (CLARITIN) 10 MG tablet Take 10 mg by mouth every morning.     Marland Kitchen losartan  (COZAAR) 100 MG tablet Take 100 mg by mouth every evening.     Marland Kitchen MEGARED OMEGA-3 KRILL OIL PO Take 1 tablet by mouth daily.    . metFORMIN (GLUCOPHAGE) 1000 MG tablet Take 1,000 mg by mouth 2 (two) times daily with a meal.    . Multiple Vitamin (MULTIVITAMIN WITH MINERALS) TABS tablet Take 1 tablet by mouth daily.    Glory Rosebush VERIO test strip USE TO TEST BID AS DIRECTED  2  . Polyethyl Glycol-Propyl Glycol (SYSTANE OP) Apply to eye as needed.    . potassium chloride SA (K-DUR) 20 MEQ tablet     . simvastatin (ZOCOR) 40 MG tablet Take 40 mg by mouth every evening.    Marland Kitchen spironolactone (ALDACTONE) 50 MG tablet Take 1 tablet by mouth daily.    . Turmeric 400 MG CAPS Take by mouth 2 (two) times daily.    . urea (CARMOL) 10 % cream Apply topically as needed.     No current facility-administered medications on file prior to visit.    Cardiovascular & other pertient studies:  EKG 05/02/2021: Sinus rhythm 60 bpm  Leftward axis Borderline first degree AV block Low voltage in precordial leads Poor R-wave progression  CTA Chest 01/15/2021: 1. Enlargement of the tubular ascending thoracic aorta measuring up to 5.0 x  4.9 cm. Enlargement of the descending thoracic aorta measuring up to 3.3 x 3.2 cm. Ascending thoracic aortic aneurysm. Recommend semi-annual imaging followup by CTA or MRA and referral to cardiothoracic surgery if not already obtained. This recommendation follows 2010 ACCF/AHA/AATS/ACR/ASA/SCA/SCAI/SIR/STS/SVM Guidelines for the Diagnosis and Management of Patients With Thoracic Aortic Disease. Circulation. 2010; 121: N235-T732. Aortic aneurysm NOS (ICD10-I71.9) 2. Elevation of the right hemidiaphragm with associated scarring or atelectasis of the right lower and middle lobes. 3. Coronary artery disease. 4. Enlargement of the main pulmonary artery as can be seen in pulmonary hypertension. 5. Cholelithiasis.  Aortic Atherosclerosis (ICD10-I70.0).  EKG 11/01/2020: Sinus rhythm  68 bpm Old anterior infarct Poor R-wave progression  Echocardiogram 09/21/2020:  1. Normal LV systolic function with visual EF 50-55%. Left ventricle  cavity is normal in size. Severe left ventricular hypertrophy. Normal  global wall motion. Doppler evidence of grade II (pseudonormal) diastolic  dysfunction, elevated LAP.  2. Left atrial cavity is moderately dilated.  3. Mild (Grade I) mitral regurgitation.  4. Mild (Grade I) aortic regurgitation.  5. Insignificant pericardial effusion.  6. The aortic root is dilated, Sinotubular junction 4.6cm. Proximal  ascending aorta not will visualized.  7. Compared to prior study dated 09/09/2019: No significant changes noted.  Abdominal Aortic Duplex 09/21/2020:  Moderate dilatation of the abdominal aorta is noted in the mid and distal  aorta. Abdominal aortic aneurysm observed. An abdominal aortic aneurysm  measuring 3.66 x 3.62 x 3.61 cm is seen.  Normal iliac artery velocity right and mildly dampened left iliac artery  velocity and may suggest suggest <50% stenosis.  No significant change from 09/19/2019. Recheck in 3 years.   Recent labs: 11/09/2020: Glucose 99, BUN/Cr 16/1.36. EGFR 57. Na/K 140/4.5.   04/24/2020: Glucose 92, BUN/Cr 10/1.3. EGFR 64. Na 141. Rest of the CMP normal H/H 13/42.  HbA1C 5.5 % Chol 134, TG 125, HDL 47, LDL 62 TSH 2.57 normal   Review of Systems  Cardiovascular: Negative for chest pain, dyspnea on exertion, leg swelling, palpitations and syncope.       Vitals:   05/02/21 0928  BP: (!) 155/85  Pulse: 66  Resp: 16  Temp: 98.6 F (37 C)  SpO2: 96%     Body mass index is 41.64 kg/m. Filed Weights   05/02/21 0928  Weight: (!) 307 lb (139.3 kg)     Objective:   Physical Exam Vitals and nursing note reviewed.  Constitutional:      General: He is not in acute distress. Neck:     Vascular: No JVD.  Cardiovascular:     Rate and Rhythm: Normal rate and regular rhythm.     Pulses: Intact  distal pulses.          Carotid pulses are 2+ on the right side and 2+ on the left side.      Dorsalis pedis pulses are 2+ on the right side and 2+ on the left side.       Posterior tibial pulses are 2+ on the right side and 2+ on the left side.     Heart sounds: Normal heart sounds. No murmur heard. No gallop.   Pulmonary:     Effort: Pulmonary effort is normal.     Breath sounds: Normal breath sounds. No wheezing or rales.  Neurological:     Cranial Nerves: No cranial nerve deficit.           Assessment & Recommendations:   80 y.o. African-American male  with uncontrolled hypertension, uncontrolled type  II diabetes mellitus, CKD stage 2/3, OSA on CPAP, heart failure with preserved ejection fraction.  Essential hypertension: Well controlled. No change made today.  Thoracic aortic aneurysm without rupture [facility]): CTA 01/2021: Enlargement of the tubular ascending thoracic aorta measuring up to 5.0 x 4.9 cm.  Enlargement of the descending thoracic aorta measuring up to 3.3 x 3.2 cm.  Seen by Dr. Cyndia Bent in 01/2021, recommended continued medical management and surveillance Will check echocardiogram in 10/2021, CTA in 01/2022. Continue losartan, carvedilol  Hypertension: On multiple agents. BP control remains crucial to slowing progression of TAA. Arranged for remote patient monitoring through pur pharmacist Manuela Schwartz. No change made today. Continue CPAP for OSA.   Coronary artery disease: Seen on CTA No angina symptoms at this time. He will likely need coronary evaluation if and when he requires TAA repair Continue Aspirin 81 mg,  LDL 62 on simvastatin 40 mg. Continue the same.  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 Repeat aorta duplex in 3 years  F/u in 6 months Will get labs recently performed at Dr. Keane Police office.  Nigel Mormon, MD [facility] [place] Pager: [phone] Office: [phone]

## 2021-05-06 DIAGNOSIS — H3530 Unspecified macular degeneration: Secondary | ICD-10-CM | POA: Diagnosis not present

## 2021-05-06 DIAGNOSIS — D696 Thrombocytopenia, unspecified: Secondary | ICD-10-CM | POA: Diagnosis not present

## 2021-05-06 DIAGNOSIS — N1831 Chronic kidney disease, stage 3a: Secondary | ICD-10-CM | POA: Diagnosis not present

## 2021-05-06 DIAGNOSIS — E1129 Type 2 diabetes mellitus with other diabetic kidney complication: Secondary | ICD-10-CM | POA: Diagnosis not present

## 2021-05-06 DIAGNOSIS — I714 Abdominal aortic aneurysm, without rupture: Secondary | ICD-10-CM | POA: Diagnosis not present

## 2021-05-06 DIAGNOSIS — Z0000 Encounter for general adult medical examination without abnormal findings: Secondary | ICD-10-CM | POA: Diagnosis not present

## 2021-05-06 DIAGNOSIS — I130 Hypertensive heart and chronic kidney disease with heart failure and stage 1 through stage 4 chronic kidney disease, or unspecified chronic kidney disease: Secondary | ICD-10-CM | POA: Diagnosis not present

## 2021-05-06 DIAGNOSIS — R82998 Other abnormal findings in urine: Secondary | ICD-10-CM | POA: Diagnosis not present

## 2021-05-06 DIAGNOSIS — I429 Cardiomyopathy, unspecified: Secondary | ICD-10-CM | POA: Diagnosis not present

## 2021-05-06 DIAGNOSIS — R809 Proteinuria, unspecified: Secondary | ICD-10-CM | POA: Diagnosis not present

## 2021-05-17 ENCOUNTER — Encounter: Payer: Self-pay | Admitting: Podiatry

## 2021-05-17 ENCOUNTER — Other Ambulatory Visit: Payer: Self-pay

## 2021-05-17 ENCOUNTER — Ambulatory Visit: Payer: Medicare HMO | Admitting: Podiatry

## 2021-05-17 DIAGNOSIS — M2140 Flat foot [pes planus] (acquired), unspecified foot: Secondary | ICD-10-CM

## 2021-05-17 DIAGNOSIS — B351 Tinea unguium: Secondary | ICD-10-CM

## 2021-05-17 DIAGNOSIS — E119 Type 2 diabetes mellitus without complications: Secondary | ICD-10-CM | POA: Diagnosis not present

## 2021-05-17 DIAGNOSIS — M79675 Pain in left toe(s): Secondary | ICD-10-CM | POA: Diagnosis not present

## 2021-05-17 DIAGNOSIS — M79674 Pain in right toe(s): Secondary | ICD-10-CM

## 2021-05-17 DIAGNOSIS — M2010 Hallux valgus (acquired), unspecified foot: Secondary | ICD-10-CM

## 2021-05-17 NOTE — Progress Notes (Signed)
This patient returns to my office for at risk foot care.  This patient requires this care by a professional since this patient will be at risk due to having  Diabetes.  This patient is unable to cut nails himself since the patient cannot reach his nails.These nails are painful walking and wearing shoes.  This patient presents for at risk foot care today.  General Appearance  Alert, conversant and in no acute stress.  Vascular  Dorsalis pedis  are  weakly  palpable  bilaterally.  Posterior tibial pulses are absent due to ankle swelling.  Absent pedal hair.Capillary return is within normal limits  bilaterally. Cold feet bilaterally.  Neurologic  Senn-Weinstein monofilament wire test within normal limits  bilaterally. Muscle power within normal limits bilaterally.  Nails Thick disfigured discolored nails with subungual debris  from hallux to fifth toes bilaterally. No evidence of bacterial infection or drainage bilaterally.  Orthopedic  No limitations of motion  feet .  No crepitus or effusions noted.  No bony pathology or digital deformities noted. HAV  B/L.  PTT tendinitis/arthritis left foot.  Pes planus  Skin  normotropic skin with no porokeratosis noted bilaterally.  No signs of infections or ulcers noted.     Onychomycosis  Pain in right toes  Pain in left toes  Consent was obtained for treatment procedures.   Mechanical debridement of nails 1-5  bilaterally performed with a nail nipper.  Filed with dremel without incident.    Return office visit   3 months                   Told patient to return for periodic foot care and evaluation due to potential at risk complications.   Gardiner Barefoot DPM

## 2021-07-13 ENCOUNTER — Emergency Department (HOSPITAL_BASED_OUTPATIENT_CLINIC_OR_DEPARTMENT_OTHER): Payer: Medicare HMO

## 2021-07-13 ENCOUNTER — Inpatient Hospital Stay (HOSPITAL_BASED_OUTPATIENT_CLINIC_OR_DEPARTMENT_OTHER)
Admission: EM | Admit: 2021-07-13 | Discharge: 2021-07-16 | DRG: 175 | Disposition: A | Discharge status: Home / Self Care | Payer: Medicare HMO | Attending: Internal Medicine | Admitting: Internal Medicine

## 2021-07-13 ENCOUNTER — Other Ambulatory Visit: Payer: Self-pay

## 2021-07-13 ENCOUNTER — Encounter (HOSPITAL_BASED_OUTPATIENT_CLINIC_OR_DEPARTMENT_OTHER): Payer: Self-pay

## 2021-07-13 DIAGNOSIS — I2510 Atherosclerotic heart disease of native coronary artery without angina pectoris: Secondary | ICD-10-CM | POA: Diagnosis present

## 2021-07-13 DIAGNOSIS — Z8249 Family history of ischemic heart disease and other diseases of the circulatory system: Secondary | ICD-10-CM | POA: Diagnosis not present

## 2021-07-13 DIAGNOSIS — I2609 Other pulmonary embolism with acute cor pulmonale: Secondary | ICD-10-CM | POA: Diagnosis not present

## 2021-07-13 DIAGNOSIS — Z20822 Contact with and (suspected) exposure to covid-19: Secondary | ICD-10-CM | POA: Diagnosis not present

## 2021-07-13 DIAGNOSIS — E119 Type 2 diabetes mellitus without complications: Secondary | ICD-10-CM | POA: Diagnosis not present

## 2021-07-13 DIAGNOSIS — Z96653 Presence of artificial knee joint, bilateral: Secondary | ICD-10-CM | POA: Diagnosis present

## 2021-07-13 DIAGNOSIS — R0602 Shortness of breath: Secondary | ICD-10-CM | POA: Diagnosis not present

## 2021-07-13 DIAGNOSIS — Z87891 Personal history of nicotine dependence: Secondary | ICD-10-CM

## 2021-07-13 DIAGNOSIS — R079 Chest pain, unspecified: Secondary | ICD-10-CM | POA: Diagnosis not present

## 2021-07-13 DIAGNOSIS — I714 Abdominal aortic aneurysm, without rupture: Secondary | ICD-10-CM | POA: Diagnosis present

## 2021-07-13 DIAGNOSIS — Z419 Encounter for procedure for purposes other than remedying health state, unspecified: Secondary | ICD-10-CM

## 2021-07-13 DIAGNOSIS — I2699 Other pulmonary embolism without acute cor pulmonale: Secondary | ICD-10-CM | POA: Diagnosis not present

## 2021-07-13 DIAGNOSIS — E1122 Type 2 diabetes mellitus with diabetic chronic kidney disease: Secondary | ICD-10-CM | POA: Diagnosis not present

## 2021-07-13 DIAGNOSIS — Z6839 Body mass index (BMI) 39.0-39.9, adult: Secondary | ICD-10-CM

## 2021-07-13 DIAGNOSIS — H9190 Unspecified hearing loss, unspecified ear: Secondary | ICD-10-CM | POA: Diagnosis present

## 2021-07-13 DIAGNOSIS — J9601 Acute respiratory failure with hypoxia: Secondary | ICD-10-CM | POA: Diagnosis present

## 2021-07-13 DIAGNOSIS — Z823 Family history of stroke: Secondary | ICD-10-CM | POA: Diagnosis not present

## 2021-07-13 DIAGNOSIS — I5042 Chronic combined systolic (congestive) and diastolic (congestive) heart failure: Secondary | ICD-10-CM | POA: Diagnosis not present

## 2021-07-13 DIAGNOSIS — Z833 Family history of diabetes mellitus: Secondary | ICD-10-CM

## 2021-07-13 DIAGNOSIS — N1832 Chronic kidney disease, stage 3b: Secondary | ICD-10-CM | POA: Diagnosis present

## 2021-07-13 DIAGNOSIS — G4733 Obstructive sleep apnea (adult) (pediatric): Secondary | ICD-10-CM | POA: Diagnosis present

## 2021-07-13 DIAGNOSIS — I10 Essential (primary) hypertension: Secondary | ICD-10-CM | POA: Diagnosis not present

## 2021-07-13 DIAGNOSIS — Z8679 Personal history of other diseases of the circulatory system: Secondary | ICD-10-CM | POA: Diagnosis not present

## 2021-07-13 DIAGNOSIS — E669 Obesity, unspecified: Secondary | ICD-10-CM | POA: Diagnosis not present

## 2021-07-13 DIAGNOSIS — E1140 Type 2 diabetes mellitus with diabetic neuropathy, unspecified: Secondary | ICD-10-CM | POA: Diagnosis not present

## 2021-07-13 DIAGNOSIS — Z79899 Other long term (current) drug therapy: Secondary | ICD-10-CM

## 2021-07-13 DIAGNOSIS — N183 Chronic kidney disease, stage 3 unspecified: Secondary | ICD-10-CM | POA: Diagnosis not present

## 2021-07-13 DIAGNOSIS — K8020 Calculus of gallbladder without cholecystitis without obstruction: Secondary | ICD-10-CM | POA: Diagnosis not present

## 2021-07-13 DIAGNOSIS — I130 Hypertensive heart and chronic kidney disease with heart failure and stage 1 through stage 4 chronic kidney disease, or unspecified chronic kidney disease: Secondary | ICD-10-CM | POA: Diagnosis not present

## 2021-07-13 DIAGNOSIS — I712 Thoracic aortic aneurysm, without rupture: Secondary | ICD-10-CM | POA: Diagnosis not present

## 2021-07-13 DIAGNOSIS — Z9079 Acquired absence of other genital organ(s): Secondary | ICD-10-CM | POA: Diagnosis not present

## 2021-07-13 DIAGNOSIS — J9811 Atelectasis: Secondary | ICD-10-CM | POA: Diagnosis not present

## 2021-07-13 DIAGNOSIS — I700 Atherosclerosis of aorta: Secondary | ICD-10-CM | POA: Diagnosis not present

## 2021-07-13 DIAGNOSIS — I517 Cardiomegaly: Secondary | ICD-10-CM | POA: Diagnosis not present

## 2021-07-13 DIAGNOSIS — E782 Mixed hyperlipidemia: Secondary | ICD-10-CM | POA: Diagnosis present

## 2021-07-13 DIAGNOSIS — R109 Unspecified abdominal pain: Secondary | ICD-10-CM | POA: Diagnosis not present

## 2021-07-13 DIAGNOSIS — R042 Hemoptysis: Secondary | ICD-10-CM | POA: Diagnosis not present

## 2021-07-13 DIAGNOSIS — Z794 Long term (current) use of insulin: Secondary | ICD-10-CM

## 2021-07-13 LAB — CBC
HCT: 40.8 % (ref 39.0–52.0)
Hemoglobin: 13.1 g/dL (ref 13.0–17.0)
MCH: 28.4 pg (ref 26.0–34.0)
MCHC: 32.1 g/dL (ref 30.0–36.0)
MCV: 88.5 fL (ref 80.0–100.0)
Platelets: 170 10*3/uL (ref 150–400)
RBC: 4.61 MIL/uL (ref 4.22–5.81)
RDW: 14.6 % (ref 11.5–15.5)
WBC: 8.9 10*3/uL (ref 4.0–10.5)
nRBC: 0 % (ref 0.0–0.2)

## 2021-07-13 LAB — PROTIME-INR
INR: 1.1 (ref 0.8–1.2)
INR: 1 (ref 0.8–1.2)
Prothrombin Time: 13.7 seconds (ref 11.4–15.2)
Prothrombin Time: 13.6 seconds (ref 11.4–15.2)

## 2021-07-13 LAB — CBC WITH DIFFERENTIAL/PLATELET
Abs Immature Granulocytes: 0.04 10*3/uL (ref 0.00–0.07)
Basophils Absolute: 0 10*3/uL (ref 0.0–0.1)
Basophils Relative: 0 %
Eosinophils Absolute: 0.1 10*3/uL (ref 0.0–0.5)
Eosinophils Relative: 1 %
HCT: 42.4 % (ref 39.0–52.0)
Hemoglobin: 13.6 g/dL (ref 13.0–17.0)
Immature Granulocytes: 0 %
Lymphocytes Relative: 16 %
Lymphs Abs: 1.6 10*3/uL (ref 0.7–4.0)
MCH: 27.8 pg (ref 26.0–34.0)
MCHC: 32.1 g/dL (ref 30.0–36.0)
MCV: 86.7 fL (ref 80.0–100.0)
Monocytes Absolute: 1.7 10*3/uL — ABNORMAL HIGH (ref 0.1–1.0)
Monocytes Relative: 17 %
Neutro Abs: 6.3 10*3/uL (ref 1.7–7.7)
Neutrophils Relative %: 66 %
Platelets: 160 10*3/uL (ref 150–400)
RBC: 4.89 MIL/uL (ref 4.22–5.81)
RDW: 14.7 % (ref 11.5–15.5)
WBC: 9.8 10*3/uL (ref 4.0–10.5)
nRBC: 0 % (ref 0.0–0.2)

## 2021-07-13 LAB — BASIC METABOLIC PANEL
Anion gap: 9 (ref 5–15)
BUN: 16 mg/dL (ref 8–23)
CO2: 30 mmol/L (ref 22–32)
Calcium: 9.1 mg/dL (ref 8.9–10.3)
Chloride: 96 mmol/L — ABNORMAL LOW (ref 98–111)
Creatinine, Ser: 1.35 mg/dL — ABNORMAL HIGH (ref 0.61–1.24)
GFR, Estimated: 53 mL/min — ABNORMAL LOW (ref >60)
Glucose, Bld: 113 mg/dL — ABNORMAL HIGH (ref 70–99)
Potassium: 4.3 mmol/L (ref 3.5–5.1)
Sodium: 135 mmol/L (ref 135–145)

## 2021-07-13 LAB — TROPONIN I (HIGH SENSITIVITY)
Troponin I (High Sensitivity): 6 ng/L (ref <18)
Troponin I (High Sensitivity): 6 ng/L (ref <18)

## 2021-07-13 LAB — BRAIN NATRIURETIC PEPTIDE: B Natriuretic Peptide: 15.9 pg/mL (ref 0.0–100.0)

## 2021-07-13 LAB — RESP PANEL BY RT-PCR (FLU A&B, COVID) ARPGX2
Influenza A by PCR: NEGATIVE
Influenza B by PCR: NEGATIVE
SARS Coronavirus 2 by RT PCR: NEGATIVE

## 2021-07-13 MED ORDER — CARVEDILOL 25 MG PO TABS
50.0000 mg | ORAL_TABLET | Freq: Two times a day (BID) | ORAL | Status: DC
  Administered 2021-07-14 – 2021-07-16 (×5): 50 mg via ORAL
  Filled 2021-07-13 (×5): qty 2

## 2021-07-13 MED ORDER — ACETAMINOPHEN 325 MG PO TABS: 650.0000 mg | ORAL_TABLET | Freq: Four times a day (QID) | ORAL | Status: DC | PRN

## 2021-07-13 MED ORDER — HEPARIN (PORCINE) 25000 UT/250ML-% IV SOLN: 16.0000 [IU]/kg/h | INTRAVENOUS | Status: DC

## 2021-07-13 MED ORDER — ISOSORB DINITRATE-HYDRALAZINE 20-37.5 MG PO TABS
2.0000 | ORAL_TABLET | Freq: Three times a day (TID) | ORAL | Status: DC
  Administered 2021-07-13 – 2021-07-16 (×8): 2 via ORAL
  Filled 2021-07-13 (×9): qty 2

## 2021-07-13 MED ORDER — HYDROCODONE-ACETAMINOPHEN 5-325 MG PO TABS
1.0000 | ORAL_TABLET | Freq: Four times a day (QID) | ORAL | Status: DC | PRN
Start: 2021-07-13 — End: 2021-07-16

## 2021-07-13 MED ORDER — SPIRONOLACTONE 25 MG PO TABS
50.0000 mg | ORAL_TABLET | Freq: Every day | ORAL | Status: DC
  Administered 2021-07-14 – 2021-07-16 (×3): 50 mg via ORAL
  Filled 2021-07-13 (×3): qty 2

## 2021-07-13 MED ORDER — SIMVASTATIN 40 MG PO TABS
40.0000 mg | ORAL_TABLET | Freq: Every evening | ORAL | Status: DC
Start: 2021-07-14 — End: 2021-07-16
  Administered 2021-07-14 – 2021-07-15 (×2): 40 mg via ORAL
  Filled 2021-07-13 (×2): qty 1

## 2021-07-13 MED ORDER — CYANOCOBALAMIN 500 MCG PO TABS
500.0000 ug | ORAL_TABLET | Freq: Every day | ORAL | Status: DC
  Administered 2021-07-14 – 2021-07-16 (×3): 500 ug via ORAL
  Filled 2021-07-13 (×3): qty 1

## 2021-07-13 MED ORDER — LOSARTAN POTASSIUM 50 MG PO TABS
100.0000 mg | ORAL_TABLET | Freq: Every evening | ORAL | Status: DC
  Administered 2021-07-14 – 2021-07-15 (×2): 100 mg via ORAL
  Filled 2021-07-13 (×2): qty 2

## 2021-07-13 MED ORDER — IOHEXOL 350 MG/ML SOLN
80.0000 mL | Freq: Once | INTRAVENOUS | Status: AC | PRN
  Administered 2021-07-13: 70 mL via INTRAVENOUS

## 2021-07-13 MED ORDER — FELODIPINE ER 5 MG PO TB24
5.0000 mg | ORAL_TABLET | Freq: Two times a day (BID) | ORAL | Status: DC
  Administered 2021-07-13 – 2021-07-16 (×6): 5 mg via ORAL
  Filled 2021-07-13 (×8): qty 1

## 2021-07-13 MED ORDER — INSULIN ASPART 100 UNIT/ML IJ SOLN
0.0000 [IU] | Freq: Three times a day (TID) | INTRAMUSCULAR | Status: DC
  Administered 2021-07-14 (×2): 1 [IU] via SUBCUTANEOUS
  Administered 2021-07-14: 2 [IU] via SUBCUTANEOUS
  Administered 2021-07-15 – 2021-07-16 (×4): 1 [IU] via SUBCUTANEOUS

## 2021-07-13 MED ORDER — ASPIRIN EC 81 MG PO TBEC
81.0000 mg | DELAYED_RELEASE_TABLET | Freq: Every day | ORAL | Status: DC
  Administered 2021-07-14 – 2021-07-16 (×3): 81 mg via ORAL
  Filled 2021-07-13 (×3): qty 1

## 2021-07-13 MED ORDER — ACETAMINOPHEN 650 MG RE SUPP: 650.0000 mg | Freq: Four times a day (QID) | RECTAL | Status: DC | PRN

## 2021-07-13 MED ORDER — HEPARIN (PORCINE) 25000 UT/250ML-% IV SOLN
2250.0000 [IU]/h | INTRAVENOUS | Status: DC
  Administered 2021-07-13: 1900 [IU]/h via INTRAVENOUS
  Administered 2021-07-14: 2250 [IU]/h via INTRAVENOUS
  Filled 2021-07-13 (×4): qty 250

## 2021-07-13 MED ORDER — HEPARIN BOLUS VIA INFUSION
7000.0000 [IU] | Freq: Once | INTRAVENOUS | Status: AC
  Administered 2021-07-13: 7000 [IU] via INTRAVENOUS

## 2021-07-13 MED ORDER — HEPARIN BOLUS VIA INFUSION: 4000.0000 [IU] | Freq: Once | INTRAVENOUS | Status: DC

## 2021-07-13 NOTE — ED Notes (Signed)
Patient denies any shortness of breath or chest pain, states he feels pretty good right now.

## 2021-07-13 NOTE — ED Notes (Signed)
Report given to [employer] on [facility]. Family and patient updated on plan of care.

## 2021-07-13 NOTE — ED Notes (Signed)
Patient up to restroom, tolerated well with cane. Patients O2 sats to 87% and RR 32 after ambulation. Patient placed back on monitor, wife at bedside.

## 2021-07-13 NOTE — H&P (Addendum)
History and Physical    Bradley Alvarez [id number] DOB: January 26, 1941 DOA: 07/13/2021  PCP: [name], MD  Patient coming from: Home.  Chief Complaint: Chest pain.  HPI: DALY EOFF is a 80 y.o. male with history of hypertension, sleep apnea, diabetes mellitus type 2, chronic kidney disease stage III has been experiencing chest pain for the last 3 to 4 days.  Pain is mostly in the right side pleuritic type.  Over the last 24 hours patient also had some central chest pressure.  Denies any productive cough fever or chills.  Has not had any recent travel or surgeries.  No recent admissions to hospital for more than 3 days.  ED Course: In the ER patient had CT angiogram of the chest which shows a right-sided wedge consolidation concerning for pulmonary infarction with occult pulmonary embolism.  Given the symptoms patient was started on heparin.  High sensitive troponins and BNP were negative.  COVID test was negative.  EKG was showing normal sinus rhythm.  Labs are largely unremarkable and at baseline.  Review of Systems: As per HPI, rest all negative.   Past Medical History:  Diagnosis Date   AAA (abdominal aortic aneurysm) without rupture ([place])    followed by dr Derek Jack--  per office note dated 11-05-2018  3.5cm   Benign essential HTN    cardiologist-- dr Margart Sickles   Chronic combined systolic (congestive) and diastolic (congestive) heart failure [facility])    cardiologist-- dr Robb Matar   CKD (chronic kidney disease), stage III ([place])    Diabetes mellitus type 2, insulin dependent ([place])    followed by dr Virgina Jock   Diabetic neuropathy ([place])    DOE (dyspnea on exertion)    Edema of both lower extremities    Full dentures    HOH (hard of hearing)    does wear hearing aids   Hydrocele, bilateral    Lichen planus    Mixed hyperlipidemia    Morbid obesity with BMI of 45.0-49.9, adult ([place])    Nocturia    OA (osteoarthritis)    OSA on CPAP 10/14/08   per study  AHI  61.8/hr ,  RDI   96.1/hr.     Past Surgical History:  Procedure Laterality Date   CARDIAC CATHETERIZATION  per pt yrs ago   was told no blockage   CARDIOVASCULAR STRESS TEST  07-19-2014   dr Shelva Majestic   Low risk nuclear study w/ small apical attentuation artifact/  normal LV function and wall motion , ef 53%   CATARACT EXTRACTION W/ INTRAOCULAR LENS  IMPLANT, BILATERAL  2018   COLONOSCOPY W/ BIOPSIES AND POLYPECTOMY     COLONOSCOPY WITH PROPOFOL N/A 05/16/2016   Procedure: COLONOSCOPY WITH PROPOFOL;  Surgeon: Carol Ada, MD;  Location: [facility];  Service: Endoscopy;  Laterality: N/A;   HYDROCELE EXCISION Bilateral 11/15/2018   Procedure: HYDROCELECTOMY ADULT;  Surgeon: Kathie Rhodes, MD;  [facility];  Service: Urology;  Laterality: Bilateral;   KNEE ARTHROSCOPY Right 2009   MULTIPLE TOOTH EXTRACTIONS     ORIF ANKLE FRACTURE Left 09/13/2014   Procedure: Open Reduction Internal Fixation Left Ankle Medial Malleolus;  Surgeon: Newt Minion, MD;  Location: [facility];  Service: Orthopedics;  Laterality: Left;   PROSTATECTOMY  1993 approx.  by dr Diona Fanti   for prostate enlargement   TOTAL KNEE ARTHROPLASTY Right 08/27/2009   dr Noemi Chapel '@[facility]    TOTAL KNEE ARTHROPLASTY  12/20/2012   Procedure: TOTAL KNEE ARTHROPLASTY;  Surgeon: Lorn Junes,  MD;  Location: [place];  Service: Orthopedics;  Laterality: Left;  left total knee arthroplasty   TRANSTHORACIC ECHOCARDIOGRAM  11-04-2018   dr patwardhan   moderate to severe concentric LVH, ef 56%,  grade 1 diastolic dysfunciton/  mild AV calcification annulus and leaflets without stentosis ,  mild regurg./  moderate LAE/ dilated aortic root, 4.5cm/  mild MV annulus calcification with trace regurg./  trace TR     reports that he quit smoking about 49 years ago. His smoking use included cigarettes. He has a 15.00 pack-year smoking history. He has quit using smokeless tobacco.  His smokeless tobacco use included snuff and chew. He reports  current alcohol use of about 1.0 standard drink per week. He reports that he does not use drugs.  No Known Allergies  Family History  Problem Relation Age of Onset   Hypertension Mother    Diabetes Mellitus II Mother    Heart attack Father    Diabetes Mellitus II Sister    Heart disease Sister    Diabetes Mellitus II Brother    Stroke Brother    Diabetes Mellitus II Brother    Kidney disease Brother    Diabetes Mellitus II Brother    Diabetes Mellitus II Sister    Heart disease Sister    Hypertension Other    Diabetes Mellitus II Other    Stroke Other    Heart disease Other     Prior to Admission medications   Medication Sig Start Date End Date Taking? Authorizing Provider  aspirin EC 81 MG tablet Take 81 mg by mouth daily.   Yes [provider]  BIDIL 20-37.5 MG tablet TAKE 2 TABLETS BY MOUTH THREE TIMES A DAY 12/26/20  Yes Patwardhan, Manish J, MD  carvedilol (COREG) 25 MG tablet Take 2 tablets (50 mg total) by mouth 2 (two) times daily with a meal. 05/03/20  Yes Patwardhan, Manish J, MD  Cyanocobalamin 2500 MCG TABS Take 500 mcg by mouth daily.    Yes [provider]  Dulaglutide 1.5 MG/0.5ML SOPN Inject 1 Dose into the skin once a week. Wednesday's   Yes [provider]  felodipine (PLENDIL) 5 MG 24 hr tablet Take 5 mg by mouth 2 (two) times daily.    Yes [provider]  furosemide (LASIX) 20 MG tablet TAKE ONE TABLET BY MOUTH ONE TIME DAILY *TAKE 1 TAB TWICE DAILY FOR 3-5 DAYS IF INCREASED LEG SWELLING OR SHORTNESS OF BREATH* 04/15/21  Yes Patwardhan, Manish J, MD  HYDROcodone-acetaminophen (NORCO/VICODIN) 5-325 MG tablet 2 (two) times daily. 01/06/19  Yes [provider]  loratadine (CLARITIN) 10 MG tablet Take 10 mg by mouth every morning.    Yes [provider]  losartan (COZAAR) 100 MG tablet Take 100 mg by mouth every evening.  07/27/17  Yes [provider]  MEGARED OMEGA-3 KRILL OIL PO Take 1 tablet by mouth  daily.   Yes [provider]  metFORMIN (GLUCOPHAGE) 1000 MG tablet Take 1,000 mg by mouth 2 (two) times daily with a meal.   Yes [provider]  Multiple Vitamin (MULTIVITAMIN WITH MINERALS) TABS tablet Take 1 tablet by mouth daily.   Yes [provider]  potassium chloride SA (K-DUR) 20 MEQ tablet  03/24/19  Yes [provider]  simvastatin (ZOCOR) 40 MG tablet Take 40 mg by mouth every evening.   Yes [provider]  spironolactone (ALDACTONE) 50 MG tablet Take 1 tablet by mouth daily. 08/24/18  Yes [provider]  Turmeric 400 MG CAPS Take by mouth 2 (two) times daily.   Yes [provider]  clobetasol ointment (TEMOVATE) [id number] % Apply 1 application topically daily as needed (for itching).  09/23/13   [provider]  diclofenac sodium (VOLTAREN) 1 % GEL Apply 1 application topically 4 (four) times daily as needed (For pain.).    [provider]  fluticasone (CUTIVATE) 0.005 % ointment Apply 1 application topically 2 (two) times daily.    [provider]  Roma Schanz test strip USE TO TEST BID AS DIRECTED 03/20/16   [provider]  Polyethyl Glycol-Propyl Glycol (SYSTANE OP) Apply to eye as needed.    [provider]  urea (CARMOL) 10 % cream Apply topically as needed.    [provider]    Physical Exam: Constitutional: Moderately built and nourished. Vitals:   07/13/21 1720 07/13/21 1830 07/13/21 1900 07/13/21 2000  BP:  (!) 160/86 (!) 150/91 (!) 162/80  Pulse:  67 67 71  Resp:  20 (!) 24 (!) 24  Temp:  98.8 F (37.1 C)    TempSrc:  Oral    SpO2:  98% 99% 99%  Weight: 135.9 kg     Height:       Eyes: Anicteric no pallor. ENMT: No discharge from the ears eyes nose and mouth. Neck: No mass felt.  No neck rigidity. Respiratory: No rhonchi or crepitations. Cardiovascular: S1-S2 heard. Abdomen: Soft nontender bowel sound present. Musculoskeletal: No edema. Skin:  No rash. Neurologic: Alert awake oriented time place and person.  Moves all extremities. Psychiatric: Appears normal.  Normal affect.   Labs on Admission: I have personally reviewed following labs and imaging studies  CBC: Recent Labs  Lab 07/13/21 1339  WBC 9.8  NEUTROABS 6.3  HGB 13.6  HCT 42.4  MCV 86.7  PLT 0000000   Basic Metabolic Panel: Recent Labs  Lab 07/13/21 1339  NA 135  K 4.3  CL 96*  CO2 30  GLUCOSE 113*  BUN 16  CREATININE 1.35*  CALCIUM 9.1   GFR: Estimated Creatinine Clearance: 64 mL/min (A) (by C-G formula based on SCr of 1.35 mg/dL (H)). Liver Function Tests: No results for input(s): AST, ALT, ALKPHOS, BILITOT, PROT, ALBUMIN in the last 168 hours. No results for input(s): LIPASE, AMYLASE in the last 168 hours. No results for input(s): AMMONIA in the last 168 hours. Coagulation Profile: Recent Labs  Lab 07/13/21 1814  INR 1.1   Cardiac Enzymes: No results for input(s): CKTOTAL, CKMB, CKMBINDEX, TROPONINI in the last 168 hours. BNP (last 3 results) No results for input(s): PROBNP in the last 8760 hours. HbA1C: No results for input(s): HGBA1C in the last 72 hours. CBG: No results for input(s): GLUCAP in the last 168 hours. Lipid Profile: No results for input(s): CHOL, HDL, LDLCALC, TRIG, CHOLHDL, LDLDIRECT in the last 72 hours. Thyroid Function Tests: No results for input(s): TSH, T4TOTAL, FREET4, T3FREE, THYROIDAB in the last 72 hours. Anemia Panel: No results for input(s): VITAMINB12, FOLATE, FERRITIN, TIBC, IRON, RETICCTPCT in the last 72 hours. Urine analysis:    Component Value Date/Time   COLORURINE YELLOW 05/22/2016 1735   APPEARANCEUR CLOUDY (A) 05/22/2016 1735   LABSPEC 1.009 05/22/2016 1735   PHURINE 5.0 05/22/2016 1735   GLUCOSEU NEGATIVE 05/22/2016 1735   HGBUR TRACE (A) 05/22/2016 1735   BILIRUBINUR NEGATIVE 05/22/2016 1735   KETONESUR NEGATIVE 05/22/2016 1735   PROTEINUR NEGATIVE 05/22/2016 1735   UROBILINOGEN 0.2  12/16/2012 1352   NITRITE NEGATIVE 05/22/2016 1735  LEUKOCYTESUR NEGATIVE 05/22/2016 1735   Sepsis Labs: '@LABRCNTIP'$ (procalcitonin:4,lacticidven:4) ) Recent Results (from the past 240 hour(s))  Resp Panel by RT-PCR (Flu A&B, Covid) Nasopharyngeal Swab     Status: None   Collection Time: 07/13/21  2:03 PM   Specimen: Nasopharyngeal Swab; Nasopharyngeal(NP) swabs in vial transport medium  Result Value Ref Range Status   SARS Coronavirus 2 by RT PCR NEGATIVE NEGATIVE Final    Comment: (NOTE) SARS-CoV-2 target nucleic acids are NOT DETECTED.  The SARS-CoV-2 RNA is generally detectable in upper respiratory specimens during the acute phase of infection. The lowest concentration of SARS-CoV-2 viral copies this assay can detect is 138 copies/mL. A negative result does not preclude SARS-Cov-2 infection and should not be used as the sole basis for treatment or other patient management decisions. A negative result may occur with  improper specimen collection/handling, submission of specimen other than nasopharyngeal swab, presence of viral mutation(s) within the areas targeted by this assay, and inadequate number of viral copies(<138 copies/mL). A negative result must be combined with clinical observations, patient history, and epidemiological information. The expected result is Negative.  Fact Sheet for Patients:  EntrepreneurPulse.com.au  Fact Sheet for Healthcare Providers:  IncredibleEmployment.be  This test is no t yet approved or cleared by the [place] FDA and  has been authorized for detection and/or diagnosis of SARS-CoV-2 by FDA under an Emergency Use Authorization (EUA). This EUA will remain  in effect (meaning this test can be used) for the duration of the COVID-19 declaration under Section 564(b)(1) of the Act, 21 U.S.C.section 360bbb-3(b)(1), unless the authorization is terminated  or revoked sooner.       Influenza A by PCR  NEGATIVE NEGATIVE Final   Influenza B by PCR NEGATIVE NEGATIVE Final    Comment: (NOTE) The Xpert Xpress SARS-CoV-2/FLU/RSV plus assay is intended as an aid in the diagnosis of influenza from Nasopharyngeal swab specimens and should not be used as a sole basis for treatment. Nasal washings and aspirates are unacceptable for Xpert Xpress SARS-CoV-2/FLU/RSV testing.  Fact Sheet for Patients: EntrepreneurPulse.com.au  Fact Sheet for Healthcare Providers: IncredibleEmployment.be  This test is not yet approved or cleared by the [place] FDA and has been authorized for detection and/or diagnosis of SARS-CoV-2 by FDA under an Emergency Use Authorization (EUA). This EUA will remain in effect (meaning this test can be used) for the duration of the COVID-19 declaration under Section 564(b)(1) of the Act, 21 U.S.C. section 360bbb-3(b)(1), unless the authorization is terminated or revoked.  Performed at [facility], [address], [place], [place] [address]      Radiological Exams on Admission: CT Angio Chest PE W and/or Wo Contrast  Result Date: 07/13/2021 CLINICAL DATA:  Shortness of breath and RIGHT flank pain for 3 days. PE suspected. EXAM: CT ANGIOGRAPHY CHEST WITH CONTRAST TECHNIQUE: Multidetector CT imaging of the chest was performed using the standard protocol during bolus administration of intravenous contrast. Multiplanar CT image reconstructions and MIPs were obtained to evaluate the vascular anatomy. CONTRAST:  13m OMNIPAQUE IOHEXOL 350 MG/ML SOLN COMPARISON:  Chest CT dated 01/15/2021. FINDINGS: Cardiovascular: Some of the most peripheral segmental and subsegmental pulmonary artery branches are difficult to characterize due to patient breathing motion artifact and suboptimal contrast opacification, however, there is no pulmonary embolism identified within the main, lobar or central segmental pulmonary arteries bilaterally.  No thoracic aortic aneurysm or evidence of aortic dissection. Mild aortic atherosclerosis. Scattered coronary artery calcifications. No pericardial effusion. Mediastinum/Nodes: No mass or enlarged lymph nodes  are seen within the mediastinum. Esophagus is unremarkable. Trachea and central bronchi are unremarkable. Lungs/Pleura: Patchy consolidations at each lung base, some component chronic, some components being new including a wedge-shaped consolidation abutting the pleura of the RIGHT lower lobe with surrounding gland glass opacity (series 9, image 98), suspicious for pulmonary infarct. Additional small new consolidations within the LEFT lower lobe. Upper Abdomen: No acute findings.  Cholelithiasis. Musculoskeletal: No acute-appearing osseous abnormality. Review of the MIP images confirms the above findings. IMPRESSION: 1. No central obstructing pulmonary embolism identified. 2. However, there is an acute wedge-shaped consolidation abutting the pleura of the RIGHT lower lobe with surrounding groundglass opacity, suspicious for pulmonary infarct. No pulmonary embolism identified, but this pulmonary finding is suspicious for occult PE within a peripheral subsegmental branch to the RIGHT lower lobe. 3. Additional patchy consolidations at the bilateral lung bases, atelectasis versus multifocal pneumonia. 4. Cholelithiasis without evidence of acute cholecystitis. Aortic Atherosclerosis (ICD10-I70.0). Electronically Signed   By: Franki Cabot M.D.   On: 07/13/2021 16:39   DG Chest Port 1 View  Result Date: 07/13/2021 CLINICAL DATA:  Pleuritic chest pain.  Hemoptysis. EXAM: PORTABLE CHEST 1 VIEW COMPARISON:  May 22, 2016 FINDINGS: The cardiomediastinal silhouette is stable. Elevation of the right hemidiaphragm is stable. No pneumothorax. No nodules or masses. No focal infiltrates. Mild atelectasis in the left base. No other acute abnormalities. IMPRESSION: No active disease. Electronically Signed   By: Dorise Bullion III M.D.   On: 07/13/2021 14:47    EKG: Independently reviewed.  Normal sinus rhythm.  Assessment/Plan Principal Problem:   Pulmonary embolism (HCC) Active Problems:   Diabetes mellitus ([place])   Essential hypertension   Sleep apnea, obstructive    Pulmonary consolidation concerning for pulmonary infarct likely from an occult pulmonary embolism for which patient has been empirically started on heparin.  Will check Dopplers of lower extremities and 2D echo.  Could likely be treated this as unprovoked pulmonary embolism.  Presently hemodynamically stable. Hypertension on felodipine, Coreg, BiDil, spironolactone and losartan. Diabetes mellitus type 2 we will keep patient on sliding scale coverage. Sleep apnea on CPAP at bedtime. Chronic and disease stage III creatinine appears to be at baseline. Thoracic and abdominal aortic aneurysm being followed by vascular surgery. Gallstones seen in the CAT scan presently asymptomatic.  Since patient has possible pulm embolism with multiple comorbidities and also requiring 2 L oxygen at this time will need close monitoring and further work-up and inpatient status.   DVT prophylaxis: Heparin. Code Status: Full code. Family Communication: Patient's wife. Disposition Plan: Home. Consults called: None. Admission status: Inpatient.   Rise Patience MD [facility] Pager [phone].  If 7PM-7AM, please contact night-coverage [email or web address] Password [facility]  07/13/2021, 10:19 PM

## 2021-07-13 NOTE — ED Notes (Signed)
Patient back from CT, placed on monitor. Patient remains pleasant. Reports mild shortness of breath with movement in bed. Denies CP.

## 2021-07-13 NOTE — ED Notes (Signed)
Called lab and unable to run CBC and INR placed earlier due to length of time, new labs drawn and sent.

## 2021-07-13 NOTE — ED Triage Notes (Signed)
Pt c/o SOB and R flank pain x3 days. Pt states that this morning when he awoke he noticed a trace amount of blood in his mucous. Pt with hx of CHF, denies any new swelling.

## 2021-07-13 NOTE — Progress Notes (Signed)
ANTICOAGULATION CONSULT NOTE - Initial Consult  Pharmacy Consult for Heparin Indication:  VTE Treatment  No Known Allergies  Patient Measurements: Height: '6\' 2"'$  (188 cm) Weight: 135.9 kg (299 lb 9 oz) IBW/kg (Calculated) : 82.2 Heparin Dosing Weight: 113.7 kg  Vital Signs: Temp: 98.8 F (37.1 C) (08/13 1235) Temp Source: Oral (08/13 1235) BP: 154/87 (08/13 1648) Pulse Rate: 83 (08/13 1708)  Labs: Recent Labs    07/13/21 1339 07/13/21 1539  HGB 13.6  --   HCT 42.4  --   PLT 160  --   CREATININE 1.35*  --   TROPONINIHS 6 6    Estimated Creatinine Clearance: 64 mL/min (A) (by C-G formula based on SCr of 1.35 mg/dL (H)).   Medical History: Past Medical History:  Diagnosis Date   AAA (abdominal aortic aneurysm) without rupture ([place])    followed by dr Derek Jack--  per office note dated 11-05-2018  3.5cm   Benign essential HTN    cardiologist-- dr Margart Sickles   Chronic combined systolic (congestive) and diastolic (congestive) heart failure [facility])    cardiologist-- dr Robb Matar   CKD (chronic kidney disease), stage III ([place])    Diabetes mellitus type 2, insulin dependent ([place])    followed by dr Virgina Jock   Diabetic neuropathy ([place])    DOE (dyspnea on exertion)    Edema of both lower extremities    Full dentures    HOH (hard of hearing)    does wear hearing aids   Hydrocele, bilateral    Lichen planus    Mixed hyperlipidemia    Morbid obesity with BMI of 45.0-49.9, adult ([place])    Nocturia    OA (osteoarthritis)    OSA on CPAP 10/14/08   per study  AHI  61.8/hr ,  RDI  96.1/hr.    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 Patient presenting with SOB, chest pain, and cough. Some hemoptysis noted.  CT angio concerning for pulmonary infarct and possible PE although no large central PE  noted. Heparin per pharmacy in place.  Patient is not on anticoagulation prior to arrival.  H/H 13.6/42.4; plt 160  Goal of Therapy:  Heparin level 0.3-0.7 units/ml Monitor platelets by anticoagulation protocol: Yes   Plan:  Give 7000 units bolus x 1 Start heparin infusion at 1900 units/hr Check anti-Xa level in 6-8 hours and daily while on heparin Continue to monitor H&H and platelets  Heloise Purpura 07/13/2021,5:59 PM

## 2021-07-13 NOTE — ED Provider Notes (Addendum)
[facility] DEPT Provider Note   CSN: [id number] Arrival date & time: 07/13/21  1230     History Chief Complaint  Patient presents with   Shortness of Breath    Bradley Alvarez is a 80 y.o. male.  HPI     80 year old male with history of thoracic artery aneurysm, CHF, CKD, diabetes comes in with chief complaint of shortness of breath, chest pain and cough.  Patient started having pleuritic chest pain 2 days ago.  Pain is central and worse with deep inspiration.  He is also noted increased exertional shortness of breath over the past few days.  Now just simply putting his clothes on or walking from 1 room to another get some short of breath.  Additionally he has started coughing in today and there has been some blood specks in his sputum.  Pt has no hx of PE, DVT and denies any exogenous hormone (testosterone / estrogen) use, long distance travels or surgery in the past 6 weeks, active cancer, recent immobilization.  Patient has history of thoracic artery aneurysm that is being monitored closely.  Review of system is negative for any fevers, chills and patient has not had any COVID-19.  Past Medical History:  Diagnosis Date   AAA (abdominal aortic aneurysm) without rupture ([place])    followed by dr Derek Jack--  per office note dated 11-05-2018  3.5cm   Benign essential HTN    cardiologist-- dr Margart Sickles   Chronic combined systolic (congestive) and diastolic (congestive) heart failure [facility])    cardiologist-- dr Robb Matar   CKD (chronic kidney disease), stage III ([place])    Diabetes mellitus type 2, insulin dependent ([place])    followed by dr Virgina Jock   Diabetic neuropathy ([place])    DOE (dyspnea on exertion)    Edema of both lower extremities    Full dentures    HOH (hard of hearing)    does wear hearing aids   Hydrocele, bilateral    Lichen planus    Mixed hyperlipidemia    Morbid obesity with BMI of 45.0-49.9, adult ([place])    Nocturia    OA  (osteoarthritis)    OSA on CPAP 10/14/08   per study  AHI  61.8/hr ,  RDI  96.1/hr.     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) 02/04/2019   Enlargement of abdominal aorta ([place]) 02/04/2019   Acute kidney injury ([place]) 05/22/2016   Hyponatremia 05/22/2016   Hyperlipidemia LDL goal <70 12/27/2015   OSA (obstructive sleep apnea) 04/26/2015   Edema, lower extremity 06/13/2013   Retention, urine 12/23/2012   Hypokalemia 12/22/2012   Postoperative anemia due to acute blood loss 12/22/2012   Skin bulla 12/22/2012   Essential hypertension    Diabetes mellitus type 2, uncontrolled, with complications (HCC)    Hypercholesteremia    Lichen planus  Left knee DJD    Morbid obesity with BMI of 45.0-49.9, adult (H[facility])    Sleep apnea, obstructive    Benign prostate hyperplasia    Diabetes mellitus ([place]) 05/29/2011    Past Surgical History:  Procedure Laterality Date   CARDIAC CATHETERIZATION  per pt yrs ago   was told no blockage   CARDIOVASCULAR STRESS TEST  07-19-2014   dr Shelva Majestic   Low risk nuclear study w/ small apical attentuation artifact/  normal LV function and wall motion , ef 53%   CATARACT EXTRACTION W/ INTRAOCULAR LENS  IMPLANT, BILATERAL  2018   COLONOSCOPY W/  BIOPSIES AND POLYPECTOMY     COLONOSCOPY WITH PROPOFOL N/A 05/16/2016   Procedure: COLONOSCOPY WITH PROPOFOL;  Surgeon: Carol Ada, MD;  Location: [facility];  Service: Endoscopy;  Laterality: N/A;   HYDROCELE EXCISION Bilateral 11/15/2018   Procedure: HYDROCELECTOMY ADULT;  Surgeon: Kathie Rhodes, MD;  Location: [facility];  Service: Urology;  Laterality: Bilateral;   KNEE ARTHROSCOPY Right 2009   MULTIPLE TOOTH EXTRACTIONS     ORIF ANKLE FRACTURE Left 09/13/2014   Procedure: Open Reduction Internal Fixation Left Ankle Medial Malleolus;  Surgeon: Newt Minion, MD;  Location: [facility];  Service: Orthopedics;  Laterality: Left;   PROSTATECTOMY  1993 approx.  by dr Diona Fanti   for prostate enlargement   TOTAL KNEE ARTHROPLASTY Right 08/27/2009   dr Noemi Chapel '@[facility]'$    TOTAL KNEE ARTHROPLASTY  12/20/2012   Procedure: TOTAL KNEE ARTHROPLASTY;  Surgeon: Lorn Junes, MD;  Location: [facility];  Service: Orthopedics;  Laterality: Left;  left total knee arthroplasty   TRANSTHORACIC ECHOCARDIOGRAM  11-04-2018   dr patwardhan   moderate to severe concentric LVH, ef 56%,  grade 1 diastolic dysfunciton/  mild AV calcification annulus and leaflets without stentosis ,  mild regurg./  moderate LAE/ dilated aortic root, 4.5cm/  mild MV annulus calcification with trace regurg./  trace TR       Family History  Problem Relation Age of Onset   Hypertension Mother    Diabetes Mellitus II Mother    Heart attack Father    Diabetes Mellitus II Sister    Heart disease Sister    Diabetes Mellitus II Brother    Stroke Brother    Diabetes Mellitus II Brother    Kidney disease Brother    Diabetes Mellitus II Brother    Diabetes Mellitus II Sister    Heart disease Sister    Hypertension Other    Diabetes Mellitus II Other    Stroke Other    Heart disease Other     Social History   Tobacco Use   Smoking status: Former    Packs/day: 1.50    Years: 10.00    Pack years: 15.00    Types:  Cigarettes    Quit date: 12/14/1971    Years since quitting: 49.6   Smokeless tobacco: Former    Types: Snuff, Chew  Vaping Use   Vaping Use: Never used  Substance Use Topics   Alcohol use: Yes    Alcohol/week: 1.0 standard drink    Types: 1 Standard drinks or equivalent per week    Comment: one shot qhs   Drug use: No    Home Medications Prior to Admission medications   Medication Sig Start Date End Date Taking? Authorizing Provider  aspirin EC 81 MG tablet Take 81 mg by mouth daily.   Yes [provider]  BIDIL 20-37.5 MG tablet TAKE 2 TABLETS BY  MOUTH THREE TIMES A DAY 12/26/20  Yes Patwardhan, Manish J, MD  carvedilol (COREG) 25 MG tablet Take 2 tablets (50 mg total) by mouth 2 (two) times daily with a meal. 05/03/20  Yes Patwardhan, Manish J, MD  clobetasol ointment (TEMOVATE) [id number] % Apply 1 application topically daily as needed (for itching).  09/23/13  Yes [provider]  Cyanocobalamin 2500 MCG TABS Take 500 mcg by mouth daily.    Yes [provider]  diclofenac sodium (VOLTAREN) 1 % GEL Apply 1 application topically 4 (four) times daily as needed (For pain.).   Yes [provider]  Dulaglutide 1.5 MG/0.5ML SOPN Inject 1 Dose into the skin once a week. Wednesday's   Yes [provider]  felodipine (PLENDIL) 5 MG 24 hr tablet Take 5 mg by mouth 2 (two) times daily.    Yes [provider]  fluticasone (CUTIVATE) 0.005 % ointment Apply 1 application topically 2 (two) times daily.   Yes [provider]  furosemide (LASIX) 20 MG tablet TAKE ONE TABLET BY MOUTH ONE TIME DAILY *TAKE 1 TAB TWICE DAILY FOR 3-5 DAYS IF INCREASED LEG SWELLING OR SHORTNESS OF BREATH* 04/15/21  Yes Patwardhan, Manish J, MD  HYDROcodone-acetaminophen (NORCO/VICODIN) 5-325 MG tablet 2 (two) times daily. 01/06/19  Yes [provider]  loratadine (CLARITIN) 10 MG tablet Take 10 mg by mouth every morning.    Yes [provider]  losartan  (COZAAR) 100 MG tablet Take 100 mg by mouth every evening.  07/27/17  Yes [provider]  MEGARED OMEGA-3 KRILL OIL PO Take 1 tablet by mouth daily.   Yes [provider]  metFORMIN (GLUCOPHAGE) 1000 MG tablet Take 1,000 mg by mouth 2 (two) times daily with a meal.   Yes [provider]  Multiple Vitamin (MULTIVITAMIN WITH MINERALS) TABS tablet Take 1 tablet by mouth daily.   Yes [provider]  Roma Schanz test strip USE TO TEST BID AS DIRECTED 03/20/16  Yes [provider]  Polyethyl Glycol-Propyl Glycol (SYSTANE OP) Apply to eye as needed.   Yes [provider]  potassium chloride SA (K-DUR) 20 MEQ tablet  03/24/19  Yes [provider]  simvastatin (ZOCOR) 40 MG tablet Take 40 mg by mouth every evening.   Yes [provider]  spironolactone (ALDACTONE) 50 MG tablet Take 1 tablet by mouth daily. 08/24/18  Yes [provider]  Turmeric 400 MG CAPS Take by mouth 2 (two) times daily.   Yes [provider]  urea (CARMOL) 10 % cream Apply topically as needed.   Yes [provider]    Allergies    Patient has no known allergies.  Review of Systems   Review of Systems  Constitutional:  Positive for activity change.  Respiratory:  Positive for cough and shortness of breath.   Cardiovascular:  Positive for chest pain.  Neurological:  Negative for dizziness.  Hematological:  Does not bruise/bleed easily.  All other systems reviewed and are negative.  Physical Exam Updated Vital Signs BP 125/76 (BP Location: Left Arm)   Pulse 72   Temp 98.6 F (37 C) (Oral)   Resp 19   Ht '6\' 2"'$  (1.88 m)   Wt 135.9 kg   SpO2 98%   BMI 38.46 kg/m   Physical Exam Vitals and nursing note reviewed.  Constitutional:      Appearance: He is well-developed.  HENT:     Head: Atraumatic.  Neck:     Vascular: No JVD.  Cardiovascular:     Rate and Rhythm: Normal rate.  Pulmonary:     Effort: Pulmonary effort  is normal.     Breath sounds: Examination of the right-lower field reveals rales. Examination of the left-lower field reveals rales. Rales present. No decreased breath sounds, wheezing or rhonchi.  Musculoskeletal:     Cervical back: Neck supple.     Right lower leg: No tenderness. Edema present.     Left lower leg: No tenderness. Edema present.  Skin:    General: Skin is warm.  Neurological:     Mental Status: He is alert and oriented to person, place, and time.    ED Results / Procedures / Treatments   Labs (all labs ordered are listed, but only abnormal results are displayed) Labs Reviewed  BASIC METABOLIC PANEL - Abnormal; Notable for the following components:      Result Value   Chloride 96 (*)    Glucose, Bld 113 (*)    Creatinine, Ser 1.35 (*)    GFR, Estimated 53 (*)    All other components within normal limits  CBC WITH DIFFERENTIAL/PLATELET - Abnormal; Notable for the following components:   Monocytes Absolute 1.7 (*)    All other components within normal limits  HEPARIN LEVEL (UNFRACTIONATED) - Abnormal; Notable for the following components:   Heparin Unfractionated 0.11 (*)    All other components within normal limits  CBC - Abnormal; Notable for the following components:   Hemoglobin 12.9 (*)    All other components within normal limits  COMPREHENSIVE METABOLIC PANEL - Abnormal; Notable for the following components:   Sodium 134 (*)    Chloride 97 (*)    Glucose, Bld 152 (*)    Creatinine, Ser 1.29 (*)    Calcium 8.4 (*)    Alkaline Phosphatase 32 (*)    GFR, Estimated 56 (*)    All other components within normal limits  HEPARIN LEVEL (UNFRACTIONATED) - Abnormal; Notable for the following components:   Heparin Unfractionated 0.13 (*)    All other components within normal limits  GLUCOSE, CAPILLARY - Abnormal; Notable for the following components:   Glucose-Capillary 129 (*)    All other components within normal limits  GLUCOSE, CAPILLARY - Abnormal; Notable  for the following components:   Glucose-Capillary 200 (*)    All other components within normal limits  RESP PANEL BY RT-PCR (FLU A&B, COVID) ARPGX2  BRAIN NATRIURETIC PEPTIDE  PROTIME-INR  CBC  PROTIME-INR  TROPONIN I (HIGH SENSITIVITY)  TROPONIN I (HIGH SENSITIVITY)    EKG EKG Interpretation  Date/Time:  Saturday July 13 2021 12:39:29 EDT Ventricular Rate:  73 PR Interval:  204 QRS Duration: 100 QT Interval:  404 QTC Calculation: 445 R Axis:   -12 Text Interpretation: Normal sinus rhythm Possible Anterior infarct , age undetermined Abnormal ECG No acute changes No significant change since last tracing Confirmed by Varney Biles [phone]) on 07/13/2021 1:08:39 PM  Radiology CT Angio Chest PE W and/or Wo Contrast  Result Date: 07/13/2021 CLINICAL DATA:  Shortness of breath and RIGHT flank pain for 3 days. PE suspected. EXAM: CT ANGIOGRAPHY CHEST WITH CONTRAST TECHNIQUE: Multidetector CT imaging of the chest was performed using the standard protocol during bolus administration of intravenous contrast. Multiplanar CT image reconstructions and MIPs were obtained to evaluate the vascular anatomy. CONTRAST:  35m OMNIPAQUE IOHEXOL 350 MG/ML SOLN COMPARISON:  Chest CT dated 01/15/2021. FINDINGS: Cardiovascular: Some of the most peripheral segmental and subsegmental pulmonary artery branches are difficult to characterize due  to patient breathing motion artifact and suboptimal contrast opacification, however, there is no pulmonary embolism identified within the main, lobar or central segmental pulmonary arteries bilaterally. No thoracic aortic aneurysm or evidence of aortic dissection. Mild aortic atherosclerosis. Scattered coronary artery calcifications. No pericardial effusion. Mediastinum/Nodes: No mass or enlarged lymph nodes are seen within the mediastinum. Esophagus is unremarkable. Trachea and central bronchi are unremarkable. Lungs/Pleura: Patchy consolidations at each lung base, some  component chronic, some components being new including a wedge-shaped consolidation abutting the pleura of the RIGHT lower lobe with surrounding gland glass opacity (series 9, image 98), suspicious for pulmonary infarct. Additional small new consolidations within the LEFT lower lobe. Upper Abdomen: No acute findings.  Cholelithiasis. Musculoskeletal: No acute-appearing osseous abnormality. Review of the MIP images confirms the above findings. IMPRESSION: 1. No central obstructing pulmonary embolism identified. 2. However, there is an acute wedge-shaped consolidation abutting the pleura of the RIGHT lower lobe with surrounding groundglass opacity, suspicious for pulmonary infarct. No pulmonary embolism identified, but this pulmonary finding is suspicious for occult PE within a peripheral subsegmental branch to the RIGHT lower lobe. 3. Additional patchy consolidations at the bilateral lung bases, atelectasis versus multifocal pneumonia. 4. Cholelithiasis without evidence of acute cholecystitis. Aortic Atherosclerosis (ICD10-I70.0). Electronically Signed   By: Franki Cabot M.D.   On: 07/13/2021 16:39   DG Chest Port 1 View  Result Date: 07/13/2021 CLINICAL DATA:  Pleuritic chest pain.  Hemoptysis. EXAM: PORTABLE CHEST 1 VIEW COMPARISON:  May 22, 2016 FINDINGS: The cardiomediastinal silhouette is stable. Elevation of the right hemidiaphragm is stable. No pneumothorax. No nodules or masses. No focal infiltrates. Mild atelectasis in the left base. No other acute abnormalities. IMPRESSION: No active disease. Electronically Signed   By: Dorise Bullion III M.D.   On: 07/13/2021 14:47   VAS [place] LOWER EXTREMITY VENOUS (DVT)  Result Date: 07/14/2021  Lower Venous DVT Study Patient Name:  Bradley Alvarez  Date of Exam:   07/14/2021 Medical Rec #: [id number]       Accession #:    [id number] Date of Birth: 08-17-1941        Patient Gender: M Patient Age:   76 years Exam Location:  [facility] Procedure:      VAS [place]  LOWER EXTREMITY VENOUS (DVT) Referring Phys: Gean Birchwood --------------------------------------------------------------------------------  Indications: Pulmonary embolism.  Limitations: Poor ultrasound/tissue interface and body habitus. Comparison Study: no prior Performing Technologist: Archie Patten RVS  Examination Guidelines: A complete evaluation includes B-mode imaging, spectral Doppler, color Doppler, and power Doppler as needed of all accessible portions of each vessel. Bilateral testing is considered an integral part of a complete examination. Limited examinations for reoccurring indications may be performed as noted. The reflux portion of the exam is performed with the patient in reverse Trendelenburg.  +---------+---------------+---------+-----------+----------+-------------------+ RIGHT    CompressibilityPhasicitySpontaneityPropertiesThrombus Aging      +---------+---------------+---------+-----------+----------+-------------------+ CFV      Full           Yes      Yes                                      +---------+---------------+---------+-----------+----------+-------------------+ SFJ      Full                                                             +---------+---------------+---------+-----------+----------+-------------------+  FV Prox  Full                                                             +---------+---------------+---------+-----------+----------+-------------------+ FV Mid   Full                                                             +---------+---------------+---------+-----------+----------+-------------------+ FV Distal               Yes      Yes                                      +---------+---------------+---------+-----------+----------+-------------------+ PFV      Full                                                             +---------+---------------+---------+-----------+----------+-------------------+  POP      Full           Yes      Yes                                      +---------+---------------+---------+-----------+----------+-------------------+ PTV      Full                                                             +---------+---------------+---------+-----------+----------+-------------------+ PERO                                                  Not well visualized +---------+---------------+---------+-----------+----------+-------------------+   +---------+---------------+---------+-----------+----------+--------------+ LEFT     CompressibilityPhasicitySpontaneityPropertiesThrombus Aging +---------+---------------+---------+-----------+----------+--------------+ CFV      Full           Yes      Yes                                 +---------+---------------+---------+-----------+----------+--------------+ SFJ      Full                                                        +---------+---------------+---------+-----------+----------+--------------+ FV Prox  Full                                                        +---------+---------------+---------+-----------+----------+--------------+  FV Mid   Full                                                        +---------+---------------+---------+-----------+----------+--------------+ FV DistalFull                                                        +---------+---------------+---------+-----------+----------+--------------+ PFV      Full                                                        +---------+---------------+---------+-----------+----------+--------------+ POP      Full           Yes      Yes                                 +---------+---------------+---------+-----------+----------+--------------+ PTV      Full                                                        +---------+---------------+---------+-----------+----------+--------------+ PERO     Full                                                         +---------+---------------+---------+-----------+----------+--------------+    Summary: BILATERAL: - No evidence of deep vein thrombosis seen in the lower extremities, bilaterally. -No evidence of popliteal cyst, bilaterally.   *See table(s) above for measurements and observations.    Preliminary     Procedures .Critical Care  Date/Time: 07/14/2021 1:17 PM Performed by: Varney Biles, MD Authorized by: Varney Biles, MD   Critical care provider statement:    Critical care time (minutes):  52   Critical care was necessary to treat or prevent imminent or life-threatening deterioration of the following conditions:  Respiratory failure   Critical care was time spent personally by me on the following activities:  Discussions with consultants, evaluation of patient's response to treatment, examination of patient, ordering and performing treatments and interventions, ordering and review of laboratory studies, ordering and review of radiographic studies, pulse oximetry, re-evaluation of patient's condition, obtaining history from patient or surrogate, review of old charts and development of treatment plan with patient or surrogate   Medications Ordered in ED Medications  heparin ADULT infusion 100 units/mL (25000 units/275m) (2,250 Units/hr Intravenous New Bag/Given 07/14/21 0342)  aspirin EC tablet 81 mg (81 mg Oral Given 07/14/21 0858)  HYDROcodone-acetaminophen (NORCO/VICODIN) 5-325 MG per tablet 1 tablet (has no administration in time range)  isosorbide-hydrALAZINE (BIDIL) 20-37.5 MG per tablet 2 tablet (2 tablets Oral Given 07/14/21 0859)  carvedilol (COREG) tablet 50 mg (  50 mg Oral Given 07/14/21 0858)  felodipine (PLENDIL) 24 hr tablet 5 mg (5 mg Oral Given 07/14/21 0958)  losartan (COZAAR) tablet 100 mg (has no administration in time range)  simvastatin (ZOCOR) tablet 40 mg (has no administration in time range)  spironolactone  (ALDACTONE) tablet 50 mg (50 mg Oral Given 07/14/21 0858)  vitamin B-12 (CYANOCOBALAMIN) tablet 500 mcg (500 mcg Oral Given 07/14/21 0859)  insulin aspart (novoLOG) injection 0-9 Units (2 Units Subcutaneous Given 07/14/21 1241)  acetaminophen (TYLENOL) tablet 650 mg (has no administration in time range)    Or  acetaminophen (TYLENOL) suppository 650 mg (has no administration in time range)  iohexol (OMNIPAQUE) 350 MG/ML injection 80 mL (70 mLs Intravenous Contrast Given 07/13/21 1544)  heparin bolus via infusion 7,000 Units (7,000 Units Intravenous Bolus from Bag 07/13/21 1829)  heparin bolus via infusion 3,000 Units (3,000 Units Intravenous Bolus from Bag 07/14/21 0343)    ED Course  I have reviewed the triage vital signs and the nursing notes.  Pertinent labs & imaging results that were available during my care of the patient were reviewed by me and considered in my medical decision making (see chart for details).    MDM Rules/Calculators/A&P                           80 year old male comes in with chief complaint of shortness of breath, chest pain that is pleuritic and hemoptysis.  He is also having some right-sided flank discomfort.  He has history of thoracic artery aneurysm along with CHF.  Differential diagnosis includes pulmonary edema secondary to CHF, COVID-19, bronchitis, pneumonia, PE, cancer and worsening thoracic artery aneurysm.  Hemodynamically he is stable.  Mild rales on his pulmonary exam.  No overt signs of acute CHF, but clinical suspicion for it is fairly high.  We will start with x-rays, basic labs and get a CT scan with contrast.  Patient's O2 sat is 89% at room air arrival.  3:45 PM Patient's blood work including BNP is normal.  X-ray is not showing any concerning findings.  CT angiogram results are pending. Case signed out to Dr. Almyra Free.  Patient has history of OSA, which could explain his hypoxia. We did discuss his exertional shortness of breath that is  worsening along with the pleuritic chest pain and hemoptysis.  Disposition will be based on CT angio findings and ambulatory pulse ox.   Final Clinical Impression(s) / ED Diagnoses Final diagnoses:  Acute hypoxemic respiratory failure (HCC)  Embolism, pulmonary with infarction [facility])    Rx / DC Orders ED Discharge Orders     None        Varney Biles, MD 07/13/21 [place], Tsering Leaman, MD 07/14/21 1318

## 2021-07-13 NOTE — ED Notes (Signed)
Ambulated Pt in [place] to [place]. Pt started at 92% on Room Air. Pt went down to 88% just sitting up. While ambulating to the Bathroom and Back he dropped to 82% on Room Air with good wave form. Pt is sitting back in Bed and taking slow deep breathes and his O2 is back up to 93% on his own after about 4 minutes of deep breathing.

## 2021-07-13 NOTE — ED Notes (Signed)
MD wants Pt on 2L Nasal O2 because Pt is at 91% on room air. Pt is now on 2L Nasal O2 at 98%

## 2021-07-13 NOTE — Care Management (Signed)
Eligiblity sent for lovenox, will likely be Monday before it can be called.

## 2021-07-13 NOTE — ED Notes (Signed)
[facility] has arrived for patient 

## 2021-07-13 NOTE — ED Provider Notes (Signed)
Patient had desaturations to 82% on room air with ambulation just across the hall.  CT angio concerning for pulmonary infarct and possible PE although no large central PE noted.  Patient started on heparin given his report of hemoptysis.  Will be admitted to the hospitalist team.   Luna Fuse, MD 07/13/21 1740

## 2021-07-13 NOTE — ED Notes (Signed)
Assisted patient with urinal.

## 2021-07-14 ENCOUNTER — Inpatient Hospital Stay (HOSPITAL_COMMUNITY): Payer: Medicare HMO

## 2021-07-14 DIAGNOSIS — K8020 Calculus of gallbladder without cholecystitis without obstruction: Secondary | ICD-10-CM

## 2021-07-14 DIAGNOSIS — J9601 Acute respiratory failure with hypoxia: Secondary | ICD-10-CM | POA: Diagnosis not present

## 2021-07-14 DIAGNOSIS — I2699 Other pulmonary embolism without acute cor pulmonale: Secondary | ICD-10-CM

## 2021-07-14 DIAGNOSIS — E1122 Type 2 diabetes mellitus with diabetic chronic kidney disease: Secondary | ICD-10-CM

## 2021-07-14 DIAGNOSIS — N183 Chronic kidney disease, stage 3 unspecified: Secondary | ICD-10-CM

## 2021-07-14 DIAGNOSIS — I10 Essential (primary) hypertension: Secondary | ICD-10-CM

## 2021-07-14 DIAGNOSIS — G4733 Obstructive sleep apnea (adult) (pediatric): Secondary | ICD-10-CM

## 2021-07-14 LAB — RESPIRATORY PANEL BY PCR

## 2021-07-14 LAB — COMPREHENSIVE METABOLIC PANEL
ALT: 14 U/L (ref 0–44)
AST: 16 U/L (ref 15–41)
Albumin: 3.6 g/dL (ref 3.5–5.0)
Alkaline Phosphatase: 32 U/L — ABNORMAL LOW (ref 38–126)
Anion gap: 9 (ref 5–15)
BUN: 17 mg/dL (ref 8–23)
CO2: 28 mmol/L (ref 22–32)
Calcium: 8.4 mg/dL — ABNORMAL LOW (ref 8.9–10.3)
Chloride: 97 mmol/L — ABNORMAL LOW (ref 98–111)
Creatinine, Ser: 1.29 mg/dL — ABNORMAL HIGH (ref 0.61–1.24)
GFR, Estimated: 56 mL/min — ABNORMAL LOW (ref >60)
Glucose, Bld: 152 mg/dL — ABNORMAL HIGH (ref 70–99)
Potassium: 3.5 mmol/L (ref 3.5–5.1)
Sodium: 134 mmol/L — ABNORMAL LOW (ref 135–145)
Total Bilirubin: 1.2 mg/dL (ref 0.3–1.2)
Total Protein: 7.2 g/dL (ref 6.5–8.1)

## 2021-07-14 LAB — CBC
HCT: 40.5 % (ref 39.0–52.0)
Hemoglobin: 12.9 g/dL — ABNORMAL LOW (ref 13.0–17.0)
MCH: 28.2 pg (ref 26.0–34.0)
MCHC: 31.9 g/dL (ref 30.0–36.0)
MCV: 88.4 fL (ref 80.0–100.0)
Platelets: 156 10*3/uL (ref 150–400)
RBC: 4.58 MIL/uL (ref 4.22–5.81)
RDW: 14.5 % (ref 11.5–15.5)
WBC: 8.6 10*3/uL (ref 4.0–10.5)
nRBC: 0 % (ref 0.0–0.2)

## 2021-07-14 LAB — ECHOCARDIOGRAM COMPLETE
Area-P 1/2: 2.9 cm2
Height: 74 in
S' Lateral: 4.5 cm
Weight: 4793 oz

## 2021-07-14 LAB — D-DIMER, QUANTITATIVE: D-Dimer, Quant: 1.43 ug/mL-FEU — ABNORMAL HIGH (ref 0.00–0.50)

## 2021-07-14 LAB — GLUCOSE, CAPILLARY
Glucose-Capillary: 129 mg/dL — ABNORMAL HIGH (ref 70–99)
Glucose-Capillary: 200 mg/dL — ABNORMAL HIGH (ref 70–99)
Glucose-Capillary: 121 mg/dL — ABNORMAL HIGH (ref 70–99)
Glucose-Capillary: 155 mg/dL — ABNORMAL HIGH (ref 70–99)

## 2021-07-14 LAB — HEPARIN LEVEL (UNFRACTIONATED)
Heparin Unfractionated: 0.11 IU/mL — ABNORMAL LOW (ref 0.30–0.70)
Heparin Unfractionated: 0.13 IU/mL — ABNORMAL LOW (ref 0.30–0.70)

## 2021-07-14 LAB — LACTIC ACID, PLASMA: Lactic Acid, Venous: 0.9 mmol/L (ref 0.5–1.9)

## 2021-07-14 LAB — PROCALCITONIN: Procalcitonin: <0.1 ng/mL

## 2021-07-14 LAB — TROPONIN I (HIGH SENSITIVITY): Troponin I (High Sensitivity): 8 ng/L (ref <18)

## 2021-07-14 MED ORDER — HEPARIN BOLUS VIA INFUSION
3000.0000 [IU] | Freq: Once | INTRAVENOUS | Status: AC
  Administered 2021-07-14: 3000 [IU] via INTRAVENOUS
  Filled 2021-07-14: qty 3000

## 2021-07-14 MED ORDER — HEPARIN (PORCINE) 25000 UT/250ML-% IV SOLN
2250.0000 [IU]/h | INTRAVENOUS | Status: DC
  Administered 2021-07-14 – 2021-07-15 (×2): 2500 [IU]/h via INTRAVENOUS
  Administered 2021-07-15: 2250 [IU]/h via INTRAVENOUS
  Filled 2021-07-14 (×3): qty 250

## 2021-07-14 MED ORDER — PERFLUTREN LIPID MICROSPHERE
1.0000 mL | INTRAVENOUS | Status: AC | PRN
  Administered 2021-07-14: 2 mL via INTRAVENOUS
  Filled 2021-07-14: qty 10

## 2021-07-14 NOTE — Progress Notes (Signed)
  Echocardiogram 2D Echocardiogram with definity has been performed. Dr. Virgina Jock has been notified to read.   Darlina Sicilian M 07/14/2021, 2:43 PM

## 2021-07-14 NOTE — Progress Notes (Signed)
ANTICOAGULATION CONSULT NOTE - follow up  Pharmacy Consult for Heparin Indication:  VTE Treatment  No Known Allergies  Patient Measurements: Height: '6\' 2"'$  (188 cm) Weight: 135.9 kg (299 lb 9 oz) IBW/kg (Calculated) : 82.2 Heparin Dosing Weight: 113 kg  Vital Signs: Temp: 98.1 F (36.7 C) (08/14 1426) Temp Source: Oral (08/14 1426) BP: 131/85 (08/14 1426) Pulse Rate: 72 (08/14 1426)  Labs: Recent Labs    07/13/21 1339 07/13/21 1539 07/13/21 1814 07/13/21 2234 07/14/21 0122 07/14/21 1135  HGB 13.6  --   --  13.1 12.9*  --   HCT 42.4  --   --  40.8 40.5  --   PLT 160  --   --  170 156  --   LABPROT  --   --  13.7 13.6  --   --   INR  --   --  1.1 1.0  --   --   HEPARINUNFRC  --   --   --   --  0.11* 0.13*  CREATININE 1.35*  --   --   --  1.29*  --   TROPONINIHS 6 6  --   --   --   --      Estimated Creatinine Clearance: 67 mL/min (A) (by C-G formula based on SCr of 1.29 mg/dL (H)).   Medical History: Past Medical History:  Diagnosis Date   AAA (abdominal aortic aneurysm) without rupture ([place])    followed by dr Derek Jack--  per office note dated 11-05-2018  3.5cm   Benign essential HTN    cardiologist-- dr Margart Sickles   Chronic combined systolic (congestive) and diastolic (congestive) heart failure [facility])    cardiologist-- dr Robb Matar   CKD (chronic kidney disease), stage III ([place])    Diabetes mellitus type 2, insulin dependent ([place])    followed by dr Virgina Jock   Diabetic neuropathy ([place])    DOE (dyspnea on exertion)    Edema of both lower extremities    Full dentures    HOH (hard of hearing)    does wear hearing aids   Hydrocele, bilateral    Lichen planus    Mixed hyperlipidemia    Morbid obesity with BMI of 45.0-49.9, adult ([place])    Nocturia    OA (osteoarthritis)    OSA on CPAP 10/14/08   per study  AHI  61.8/hr ,  RDI  96.1/hr.    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 Patient presenting with SOB, chest pain, and cough. Some hemoptysis noted.  CT angio concerning for pulmonary infarct and possible PE although no large central PE noted. Venous doppler negative for DVT. CCM following - still high probability for PE; VQ scan ordered. Pharmacy consulted to dose/monitor heparin.  07/14/2021 HL = 0.13 remains subtherapeutic despite increasing rate of heparin infusion to 2250 units/hr SCr 1.29, CrCl ~65 mL/min. Stable CBC: Hgb (12.9) slightly low; Plt WNL and stable Confirmed with RN that heparin infusing at correct rate. No interruptions or issues with infusion. No signs of bleeding.   Goal of Therapy:  Heparin level 0.3-0.7 units/ml Monitor platelets by anticoagulation protocol: Yes   Plan:  Heparin bolus of 3000 units IV once Increase rate of heparin infusion to 2500 units/hr Check 8 hour HL HL, CBC daily while on heparin infusion Monitor for signs of bleeding  Lenis Noon, PharmD 07/14/21 2:37 PM

## 2021-07-14 NOTE — Progress Notes (Signed)
PROGRESS NOTE    Bradley Alvarez  [id number] DOB: 1941/02/17 DOA: 07/13/2021 PCP: [name], MD    Brief Narrative:  Bradley Alvarez is a 80 y.o. male with history of hypertension, sleep apnea, diabetes mellitus type 2, chronic kidney disease stage III has been experiencing chest pain for the last 3 to 4 days.  Pain is mostly in the right side pleuritic type.  Over the last 24 hours patient also had some central chest pressure.  Denies any productive cough fever or chills.  Has not had any recent travel or surgeries.  No recent admissions to hospital for more than 3 days.   ED Course: In the ER patient had CT angiogram of the chest which shows a right-sided wedge consolidation concerning for pulmonary infarction with occult pulmonary embolism.  Given the symptoms patient was started on heparin.  High sensitive troponins and BNP were negative.  COVID test was negative.  EKG was showing normal sinus rhythm.  Labs are largely unremarkable and at baseline.  8/14-daughter at bedside.  Patient with some shortness of breath.  Currently on 4.5 L O2 while I am in the room.  Nontachypneic. Patient reported to me that he is mostly sedentary watching TV.  No long distance traveling reported. Consultants:   CT of the chest 1. No central obstructing pulmonary embolism identified. 2. However, there is an acute wedge-shaped consolidation abutting the pleura of the RIGHT lower lobe with surrounding groundglass opacity, suspicious for pulmonary infarct. No pulmonary embolism identified, but this pulmonary finding is suspicious for occult PE within a peripheral subsegmental branch to the RIGHT lower lobe. 3. Additional patchy consolidations at the bilateral lung bases, atelectasis versus multifocal pneumonia. 4. Cholelithiasis without evidence of acute cholecystitis.   Aortic Atherosclerosis (ICD10-I70.0).   Procedures:   Antimicrobials:      Subjective: Minimal chest pain.  No other  complaints  Objective: Vitals:   07/14/21 0037 07/14/21 0207 07/14/21 0608 07/14/21 1028  BP:  133/72 (!) 148/85 125/76  Pulse: 80 74 71 72  Resp:  (!) '24 16 19  '$ Temp:  98.8 F (37.1 C) 99.7 F (37.6 C) 98.6 F (37 C)  TempSrc:  Oral Oral Oral  SpO2: 93% 90% 94% 98%  Weight:      Height:        Intake/Output Summary (Last 24 hours) at 07/14/2021 1127 Last data filed at 07/14/2021 0940 Gross per 24 hour  Intake 679.31 ml  Output 650 ml  Net 29.31 ml   Filed Weights   07/13/21 1236 07/13/21 1720  Weight: (!) 139.3 kg 135.9 kg    Examination:  General exam: Appears calm and comfortable  Respiratory system: Clear to auscultation. Respiratory effort normal. Cardiovascular system: S1 & S2 heard, RRR. No JVD, murmurs, rubs, gallops or clicks.  Gastrointestinal system: Abdomen is nondistended, soft and nontender.  Normal bowel sounds heard. Central nervous system: Alert and oriented.  Grossly intact Extremities: Mild lower extremity Psychiatry: Judgement and insight appear normal. Mood & affect appropriate.     Data Reviewed: I have personally reviewed following labs and imaging studies  CBC: Recent Labs  Lab 07/13/21 1339 07/13/21 2234 07/14/21 0122  WBC 9.8 8.9 8.6  NEUTROABS 6.3  --   --   HGB 13.6 13.1 12.9*  HCT 42.4 40.8 40.5  MCV 86.7 88.5 88.4  PLT 160 170 [id number]   Basic Metabolic Panel: Recent Labs  Lab 07/13/21 1339 07/14/21 0122  NA 135 134*  K 4.3 3.5  CL  96* 97*  CO2 30 28  GLUCOSE 113* 152*  BUN 16 17  CREATININE 1.35* 1.29*  CALCIUM 9.1 8.4*   GFR: Estimated Creatinine Clearance: 67 mL/min (A) (by C-G formula based on SCr of 1.29 mg/dL (H)). Liver Function Tests: Recent Labs  Lab 07/14/21 0122  AST 16  ALT 14  ALKPHOS 32*  BILITOT 1.2  PROT 7.2  ALBUMIN 3.6   No results for input(s): LIPASE, AMYLASE in the last 168 hours. No results for input(s): AMMONIA in the last 168 hours. Coagulation Profile: Recent Labs  Lab  07/13/21 1814 07/13/21 2234  INR 1.1 1.0   Cardiac Enzymes: No results for input(s): CKTOTAL, CKMB, CKMBINDEX, TROPONINI in the last 168 hours. BNP (last 3 results) No results for input(s): PROBNP in the last 8760 hours. HbA1C: No results for input(s): HGBA1C in the last 72 hours. CBG: Recent Labs  Lab 07/14/21 0827  GLUCAP 129*   Lipid Profile: No results for input(s): CHOL, HDL, LDLCALC, TRIG, CHOLHDL, LDLDIRECT in the last 72 hours. Thyroid Function Tests: No results for input(s): TSH, T4TOTAL, FREET4, T3FREE, THYROIDAB in the last 72 hours. Anemia Panel: No results for input(s): VITAMINB12, FOLATE, FERRITIN, TIBC, IRON, RETICCTPCT in the last 72 hours. Sepsis Labs: No results for input(s): PROCALCITON, LATICACIDVEN in the last 168 hours.  Recent Results (from the past 240 hour(s))  Resp Panel by RT-PCR (Flu A&B, Covid) Nasopharyngeal Swab     Status: None   Collection Time: 07/13/21  2:03 PM   Specimen: Nasopharyngeal Swab; Nasopharyngeal(NP) swabs in vial transport medium  Result Value Ref Range Status   SARS Coronavirus 2 by RT PCR NEGATIVE NEGATIVE Final    Comment: (NOTE) SARS-CoV-2 target nucleic acids are NOT DETECTED.  The SARS-CoV-2 RNA is generally detectable in upper respiratory specimens during the acute phase of infection. The lowest concentration of SARS-CoV-2 viral copies this assay can detect is 138 copies/mL. A negative result does not preclude SARS-Cov-2 infection and should not be used as the sole basis for treatment or other patient management decisions. A negative result may occur with  improper specimen collection/handling, submission of specimen other than nasopharyngeal swab, presence of viral mutation(s) within the areas targeted by this assay, and inadequate number of viral copies(<138 copies/mL). A negative result must be combined with clinical observations, patient history, and epidemiological information. The expected result is  Negative.  Fact Sheet for Patients:  EntrepreneurPulse.com.au  Fact Sheet for Healthcare Providers:  IncredibleEmployment.be  This test is no t yet approved or cleared by the [place] FDA and  has been authorized for detection and/or diagnosis of SARS-CoV-2 by FDA under an Emergency Use Authorization (EUA). This EUA will remain  in effect (meaning this test can be used) for the duration of the COVID-19 declaration under Section 564(b)(1) of the Act, 21 U.S.C.section 360bbb-3(b)(1), unless the authorization is terminated  or revoked sooner.       Influenza A by PCR NEGATIVE NEGATIVE Final   Influenza B by PCR NEGATIVE NEGATIVE Final    Comment: (NOTE) The Xpert Xpress SARS-CoV-2/FLU/RSV plus assay is intended as an aid in the diagnosis of influenza from Nasopharyngeal swab specimens and should not be used as a sole basis for treatment. Nasal washings and aspirates are unacceptable for Xpert Xpress SARS-CoV-2/FLU/RSV testing.  Fact Sheet for Patients: EntrepreneurPulse.com.au  Fact Sheet for Healthcare Providers: IncredibleEmployment.be  This test is not yet approved or cleared by the [place] FDA and has been authorized for detection and/or diagnosis of SARS-CoV-2 by  FDA under an Emergency Use Authorization (EUA). This EUA will remain in effect (meaning this test can be used) for the duration of the COVID-19 declaration under Section 564(b)(1) of the Act, 21 U.S.C. section 360bbb-3(b)(1), unless the authorization is terminated or revoked.  Performed at [facility], [address] [place], [place] [address]          Radiology Studies: CT Angio Chest PE W and/or Wo Contrast  Result Date: 07/13/2021 CLINICAL DATA:  Shortness of breath and RIGHT flank pain for 3 days. PE suspected. EXAM: CT ANGIOGRAPHY CHEST WITH CONTRAST TECHNIQUE: Multidetector CT imaging of the  chest was performed using the standard protocol during bolus administration of intravenous contrast. Multiplanar CT image reconstructions and MIPs were obtained to evaluate the vascular anatomy. CONTRAST:  18m OMNIPAQUE IOHEXOL 350 MG/ML SOLN COMPARISON:  Chest CT dated 01/15/2021. FINDINGS: Cardiovascular: Some of the most peripheral segmental and subsegmental pulmonary artery branches are difficult to characterize due to patient breathing motion artifact and suboptimal contrast opacification, however, there is no pulmonary embolism identified within the main, lobar or central segmental pulmonary arteries bilaterally. No thoracic aortic aneurysm or evidence of aortic dissection. Mild aortic atherosclerosis. Scattered coronary artery calcifications. No pericardial effusion. Mediastinum/Nodes: No mass or enlarged lymph nodes are seen within the mediastinum. Esophagus is unremarkable. Trachea and central bronchi are unremarkable. Lungs/Pleura: Patchy consolidations at each lung base, some component chronic, some components being new including a wedge-shaped consolidation abutting the pleura of the RIGHT lower lobe with surrounding gland glass opacity (series 9, image 98), suspicious for pulmonary infarct. Additional small new consolidations within the LEFT lower lobe. Upper Abdomen: No acute findings.  Cholelithiasis. Musculoskeletal: No acute-appearing osseous abnormality. Review of the MIP images confirms the above findings. IMPRESSION: 1. No central obstructing pulmonary embolism identified. 2. However, there is an acute wedge-shaped consolidation abutting the pleura of the RIGHT lower lobe with surrounding groundglass opacity, suspicious for pulmonary infarct. No pulmonary embolism identified, but this pulmonary finding is suspicious for occult PE within a peripheral subsegmental branch to the RIGHT lower lobe. 3. Additional patchy consolidations at the bilateral lung bases, atelectasis versus multifocal  pneumonia. 4. Cholelithiasis without evidence of acute cholecystitis. Aortic Atherosclerosis (ICD10-I70.0). Electronically Signed   By: SFranki CabotM.D.   On: 07/13/2021 16:39   DG Chest Port 1 View  Result Date: 07/13/2021 CLINICAL DATA:  Pleuritic chest pain.  Hemoptysis. EXAM: PORTABLE CHEST 1 VIEW COMPARISON:  May 22, 2016 FINDINGS: The cardiomediastinal silhouette is stable. Elevation of the right hemidiaphragm is stable. No pneumothorax. No nodules or masses. No focal infiltrates. Mild atelectasis in the left base. No other acute abnormalities. IMPRESSION: No active disease. Electronically Signed   By: DDorise BullionIII M.D.   On: 07/13/2021 14:47   VAS [place]OWER EXTREMITY VENOUS (DVT)  Result Date: 07/14/2021  Lower Venous DVT Study Patient Name:  Bradley Alvarez Date of Exam:   07/14/2021 Medical Rec #: [id number]      Accession #:    [id number] of Birth: 510-29-1942       Patient Gender: M Patient Age:   89years [facility]:      VAS [place]OWER EXTREMITY VENOUS (DVT) Referring Phys: AGean Birchwood--------------------------------------------------------------------------------  Indications: Pulmonary embolism.  Limitations: Poor ultrasound/tissue interface and body habitus. Comparison Study: no prior Performing Technologist: MArchie PattenRVS  Examination Guidelines: A complete evaluation includes B-mode imaging, spectral Doppler, color Doppler, and power Doppler as  needed of all accessible portions of each vessel. Bilateral testing is considered an integral part of a complete examination. Limited examinations for reoccurring indications may be performed as noted. The reflux portion of the exam is performed with the patient in reverse Trendelenburg.  +---------+---------------+---------+-----------+----------+-------------------+ RIGHT    CompressibilityPhasicitySpontaneityPropertiesThrombus Aging       +---------+---------------+---------+-----------+----------+-------------------+ CFV      Full           Yes      Yes                                      +---------+---------------+---------+-----------+----------+-------------------+ SFJ      Full                                                             +---------+---------------+---------+-----------+----------+-------------------+ FV Prox  Full                                                             +---------+---------------+---------+-----------+----------+-------------------+ FV Mid   Full                                                             +---------+---------------+---------+-----------+----------+-------------------+ FV Distal               Yes      Yes                                      +---------+---------------+---------+-----------+----------+-------------------+ PFV      Full                                                             +---------+---------------+---------+-----------+----------+-------------------+ POP      Full           Yes      Yes                                      +---------+---------------+---------+-----------+----------+-------------------+ PTV      Full                                                             +---------+---------------+---------+-----------+----------+-------------------+ PERO  Not well visualized +---------+---------------+---------+-----------+----------+-------------------+   +---------+---------------+---------+-----------+----------+--------------+ LEFT     CompressibilityPhasicitySpontaneityPropertiesThrombus Aging +---------+---------------+---------+-----------+----------+--------------+ CFV      Full           Yes      Yes                                 +---------+---------------+---------+-----------+----------+--------------+ SFJ      Full                                                         +---------+---------------+---------+-----------+----------+--------------+ FV Prox  Full                                                        +---------+---------------+---------+-----------+----------+--------------+ FV Mid   Full                                                        +---------+---------------+---------+-----------+----------+--------------+ FV DistalFull                                                        +---------+---------------+---------+-----------+----------+--------------+ PFV      Full                                                        +---------+---------------+---------+-----------+----------+--------------+ POP      Full           Yes      Yes                                 +---------+---------------+---------+-----------+----------+--------------+ PTV      Full                                                        +---------+---------------+---------+-----------+----------+--------------+ PERO     Full                                                        +---------+---------------+---------+-----------+----------+--------------+    Summary: BILATERAL: - No evidence of deep vein thrombosis seen in the lower extremities, bilaterally. -No evidence of popliteal cyst, bilaterally.   *See table(s) above for measurements and observations.    Preliminary  Scheduled Meds:  aspirin EC  81 mg Oral Daily   carvedilol  50 mg Oral BID WC   felodipine  5 mg Oral BID   insulin aspart  0-9 Units Subcutaneous TID WC   isosorbide-hydrALAZINE  2 tablet Oral TID   losartan  100 mg Oral QPM   simvastatin  40 mg Oral QPM   spironolactone  50 mg Oral Daily   vitamin B-12  500 mcg Oral Daily   Continuous Infusions:  heparin 2,250 Units/hr (07/14/21 0342)    Assessment & Plan:   Principal Problem:   Pulmonary embolism ([place]) Active Problems:   Diabetes mellitus ([place])    Essential hypertension   Sleep apnea, obstructive   #Pulmonary consolidation concerning for pulmonary infarct likely from an occult pulmonary embolism  #See ct chest report Currently requiring 4.5 L O2, wean down as tolerated Continue on heparin drip until requirements for O2 is less before transitioning to Eliquis When starting Eliquis will likely need loading dose plus maintenance PCCM consulted as unclear about CT of the chest whether pulmonary infarct?  /occult PE. PE likely due to pt reporting mostly sedentary at home Ck LE doppler Ck Echo  #HTN- Stable Continue current cardiac meds  #Diabetes mellitus type 2 BG stable Continue R-ISS  #Sleep apnea On CPAP at bedtime  #Chronic kidney disease stage III At baseline  #Thoracic and abdominal aortic aneurysm Followed by vascular surgery  #Gall[place] seen on CT scanning presently asymptomatic      DVT prophylaxis: Heparin drip Code Status: Full Family Communication: Daughter at bedside Disposition Plan:  Status is: Inpatient  Remains inpatient appropriate because:Inpatient level of care appropriate due to severity of illness  Dispo: The patient is from: Home              Anticipated d/c is to: Home              Patient currently is not medically stable to d/c.   Difficult to place patient No    Continue heparin drip.  Need to wean down on oxygen.  Consulting PCCM pending        LOS: 1 day   Time spent: 45 minutes with more than 50% on [place], MD [facility] Pager [phone]  If 7PM-7AM, please contact night-coverage 07/14/2021, 11:27 AM

## 2021-07-14 NOTE — Progress Notes (Signed)
ANTICOAGULATION CONSULT NOTE - follow up  Pharmacy Consult for Heparin Indication:  VTE Treatment  No Known Allergies  Patient Measurements: Height: '6\' 2"'$  (188 cm) Weight: 135.9 kg (299 lb 9 oz) IBW/kg (Calculated) : 82.2 Heparin Dosing Weight: 113.7 kg  Vital Signs: Temp: 98.8 F (37.1 C) (08/14 0207) Temp Source: Oral (08/14 0207) BP: 133/72 (08/14 0207) Pulse Rate: 74 (08/14 0207)  Labs: Recent Labs    07/13/21 1339 07/13/21 1539 07/13/21 1814 07/13/21 2234 07/14/21 0122  HGB 13.6  --   --  13.1 12.9*  HCT 42.4  --   --  40.8 40.5  PLT 160  --   --  170 156  LABPROT  --   --  13.7 13.6  --   INR  --   --  1.1 1.0  --   HEPARINUNFRC  --   --   --   --  0.11*  CREATININE 1.35*  --   --   --  1.29*  TROPONINIHS 6 6  --   --   --      Estimated Creatinine Clearance: 67 mL/min (A) (by C-G formula based on SCr of 1.29 mg/dL (H)).   Medical History: Past Medical History:  Diagnosis Date   AAA (abdominal aortic aneurysm) without rupture ([place])    followed by dr Derek Jack--  per office note dated 11-05-2018  3.5cm   Benign essential HTN    cardiologist-- dr Margart Sickles   Chronic combined systolic (congestive) and diastolic (congestive) heart failure [facility])    cardiologist-- dr Robb Matar   CKD (chronic kidney disease), stage III ([place])    Diabetes mellitus type 2, insulin dependent ([place])    followed by dr Virgina Jock   Diabetic neuropathy ([place])    DOE (dyspnea on exertion)    Edema of both lower extremities    Full dentures    HOH (hard of hearing)    does wear hearing aids   Hydrocele, bilateral    Lichen planus    Mixed hyperlipidemia    Morbid obesity with BMI of 45.0-49.9, adult ([place])    Nocturia    OA (osteoarthritis)    OSA on CPAP 10/14/08   per study  AHI  61.8/hr ,  RDI  96.1/hr.    Medications:  Medications Prior to Admission  Medication Sig Dispense Refill Last Dose   aspirin EC 81 MG tablet Take 81 mg by mouth daily.   07/13/2021   BIDIL 20-37.5 MG  tablet TAKE 2 TABLETS BY MOUTH THREE TIMES A DAY 540 tablet 3 07/13/2021   carvedilol (COREG) 25 MG tablet Take 2 tablets (50 mg total) by mouth 2 (two) times daily with a meal. 180 tablet 3 07/13/2021   clobetasol ointment (TEMOVATE) [id number] % Apply 1 application topically daily as needed (for itching).       Cyanocobalamin 2500 MCG TABS Take 500 mcg by mouth daily.    07/13/2021   diclofenac sodium (VOLTAREN) 1 % GEL Apply 1 application topically 4 (four) times daily as needed (For pain.).      Dulaglutide 1.5 MG/0.5ML SOPN Inject 1 Dose into the skin once a week. Wednesday's   Past Week   felodipine (PLENDIL) 5 MG 24 hr tablet Take 5 mg by mouth 2 (two) times daily.    07/13/2021   fluticasone (CUTIVATE) 0.005 % ointment Apply 1 application topically 2 (two) times daily.      furosemide (LASIX) 20 MG tablet TAKE ONE TABLET BY MOUTH ONE TIME DAILY *TAKE 1  TAB TWICE DAILY FOR 3-5 DAYS IF INCREASED LEG SWELLING OR SHORTNESS OF BREATH* 90 tablet 0 07/13/2021   HYDROcodone-acetaminophen (NORCO/VICODIN) 5-325 MG tablet 2 (two) times daily.   07/13/2021   loratadine (CLARITIN) 10 MG tablet Take 10 mg by mouth every morning.    07/13/2021   losartan (COZAAR) 100 MG tablet Take 100 mg by mouth every evening.    07/12/2021   MEGARED OMEGA-3 KRILL OIL PO Take 1 tablet by mouth daily.   07/13/2021   metFORMIN (GLUCOPHAGE) 1000 MG tablet Take 1,000 mg by mouth 2 (two) times daily with a meal.   07/13/2021   Multiple Vitamin (MULTIVITAMIN WITH MINERALS) TABS tablet Take 1 tablet by mouth daily.   07/13/2021   ONETOUCH VERIO test strip USE TO TEST BID AS DIRECTED  2    Polyethyl Glycol-Propyl Glycol (SYSTANE OP) Apply to eye as needed.      potassium chloride SA (K-DUR) 20 MEQ tablet    07/13/2021   simvastatin (ZOCOR) 40 MG tablet Take 40 mg by mouth every evening.   07/12/2021   spironolactone (ALDACTONE) 50 MG tablet Take 1 tablet by mouth daily.   07/12/2021   Turmeric 400 MG CAPS Take by mouth 2 (two) times daily.    07/13/2021   urea (CARMOL) 10 % cream Apply topically as needed.      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 Patient presenting with SOB, chest pain, and cough. Some hemoptysis noted.  CT angio concerning for pulmonary infarct and possible PE although no large central PE noted. Heparin per pharmacy in place. Patient is not on anticoagulation prior to arrival.  07/14/2021 HL 0.11 subtherapeutic on 1900 units/hr Hgb 12.9, Plts WNL No bleeding or line interruptions per RN   Goal of Therapy:  Heparin level 0.3-0.7 units/ml Monitor platelets by anticoagulation protocol: Yes   Plan:  Bolus heparin 3000 units x 1 Increase heparin drip to 2250 units/hr Heparin level in 8 hours Daily CBC  Dolly Rias RPh 07/14/2021, 3:14 AM

## 2021-07-14 NOTE — Progress Notes (Signed)
Lower extremity venous has been completed.   Preliminary results in CV Proc.   Abram Sander 07/14/2021 9:16 AM

## 2021-07-14 NOTE — Progress Notes (Signed)
Pt placed on CPAP of 15 per home regimen with 4lpm bleed in pt spo2 93% machine in red outlet. Pt tolerating well

## 2021-07-14 NOTE — Consult Note (Signed)
NAME:  Bradley Alvarez, MRN:  [id number], DOB:  07/18/1941, LOS: 1 ADMISSION DATE:  07/13/2021, CONSULTATION DATE:  07/14/21 REFERRING MD:  Dr Nolberto Hanlon, CHIEF COMPLAINT:  pleurisy, RLL infarct, Acute hypoxemic resp faiure   History of Present Illness: Provided by the patient his daughter and also review of the records   80 year old male with hypertension, obesity, sleep apnea, type 2 diabetes, CKD stage III,.  Echo in 2017 with normal ejection fraction grade 1 diastolic dysfunction and mild RV dilatation.  2015 normal nuclear medicine stress test by Dr. Claiborne Billings.  Apparently 2 weeks prior to admission he suddenly decided to run after the mailman.  This was an extraordinary exertion and he was extremely short of breath.  He says he felt like his lungs were going to give out and hurt.  No chest pain but he had to stop because of extreme dyspnea.  Since then he said progressive worsening of dyspnea to the point that several days before admission he could hardly walk to the bathroom and back.  Then 3 or 4 nights prior to admission he developed pleuritic right infra axillary chest pain that persisted and then associated with worsening dyspnea on exertion no fever no edema.  No orthopnea no wheezing.  1 day prior to admission had streaky amount of hemoptysis x1.  This resulted in the ER admission.  No D-dimer available.  Troponin normal BNP normal no lactic acid available.  CT angiogram chest shows right lower lobe infarct but no obvious pulmonary embolism.  Echocardiogram pending.  His COVID and flu PCR is negative.  He is noticed to be needing 2-4 L of nasal cannula  Duplex LE - 8/14 - No DVT  Results for BEVIN, VIPPERMAN (MRN [id number]) as of 07/14/2021 13:16  Ref. Range 07/13/2021 13:39 07/13/2021 15:39  Troponin I (High Sensitivity) Latest Ref Range: <18 ng/L 6 6   Results for XAVIEN, HOENE (MRN [id number]) as of 07/14/2021 13:16  Ref. Range 07/13/2021 13:39  B Natriuretic Peptide Latest Ref Range: 0.0 -  100.0 pg/mL 15.9   Pertinent  Medical History    has a past medical history of AAA (abdominal aortic aneurysm) without rupture ([place]), Benign essential HTN, Chronic combined systolic (congestive) and diastolic (congestive) heart failure ([place]), CKD (chronic kidney disease), stage III ([place]), Diabetes mellitus type 2, insulin dependent ([place]), Diabetic neuropathy ([place]), DOE (dyspnea on exertion), Edema of both lower extremities, Full dentures, HOH (hard of hearing), Hydrocele, bilateral, Lichen planus, Mixed hyperlipidemia, Morbid obesity with BMI of 45.0-49.9, adult ([place]), Nocturia, OA (osteoarthritis), and OSA on CPAP (10/14/08).   reports that he quit smoking about 49 years ago. His smoking use included cigarettes. He has a 15.00 pack-year smoking history. He has quit using smokeless tobacco.  His smokeless tobacco use included snuff and chew.  Past Surgical History:  Procedure Laterality Date   CARDIAC CATHETERIZATION  per pt yrs ago   was told no blockage   CARDIOVASCULAR STRESS TEST  07-19-2014   dr Shelva Majestic   Low risk nuclear study w/ small apical attentuation artifact/  normal LV function and wall motion , ef 53%   CATARACT EXTRACTION W/ INTRAOCULAR LENS  IMPLANT, BILATERAL  2018   COLONOSCOPY W/ BIOPSIES AND POLYPECTOMY     COLONOSCOPY WITH PROPOFOL N/A 05/16/2016   Procedure: COLONOSCOPY WITH PROPOFOL;  Surgeon: Carol Ada, MD;  Location: [facility];  Service: Endoscopy;  Laterality: N/A;   HYDROCELE EXCISION Bilateral 11/15/2018   Procedure: HYDROCELECTOMY ADULT;  Surgeon: Karsten Ro,  Elta Guadeloupe, MD;  [facility];  Service: Urology;  Laterality: Bilateral;   KNEE ARTHROSCOPY Right 2009   MULTIPLE TOOTH EXTRACTIONS     ORIF ANKLE FRACTURE Left 09/13/2014   Procedure: Open Reduction Internal Fixation Left Ankle Medial Malleolus;  Surgeon: Newt Minion, MD;  Location: [facility];  Service: Orthopedics;  Laterality: Left;   PROSTATECTOMY  1993 approx.  by dr Diona Fanti    for prostate enlargement   TOTAL KNEE ARTHROPLASTY Right 08/27/2009   dr Noemi Chapel '@[facility]'$    TOTAL KNEE ARTHROPLASTY  12/20/2012   Procedure: TOTAL KNEE ARTHROPLASTY;  Surgeon: Lorn Junes, MD;  Location: [facility];  Service: Orthopedics;  Laterality: Left;  left total knee arthroplasty   TRANSTHORACIC ECHOCARDIOGRAM  11-04-2018   dr patwardhan   moderate to severe concentric LVH, ef 56%,  grade 1 diastolic dysfunciton/  mild AV calcification annulus and leaflets without stentosis ,  mild regurg./  moderate LAE/ dilated aortic root, 4.5cm/  mild MV annulus calcification with trace regurg./  trace TR    No Known Allergies   There is no immunization history on file for this patient.  Family History  Problem Relation Age of Onset   Hypertension Mother    Diabetes Mellitus II Mother    Heart attack Father    Diabetes Mellitus II Sister    Heart disease Sister    Diabetes Mellitus II Brother    Stroke Brother    Diabetes Mellitus II Brother    Kidney disease Brother    Diabetes Mellitus II Brother    Diabetes Mellitus II Sister    Heart disease Sister    Hypertension Other    Diabetes Mellitus II Other    Stroke Other    Heart disease Other      Current Facility-Administered Medications:    acetaminophen (TYLENOL) tablet 650 mg, 650 mg, Oral, Q6H PRN **OR** acetaminophen (TYLENOL) suppository 650 mg, 650 mg, Rectal, Q6H PRN, Rise Patience, MD   aspirin EC tablet 81 mg, 81 mg, Oral, Daily, Rise Patience, MD, 81 mg at 07/14/21 0858   carvedilol (COREG) tablet 50 mg, 50 mg, Oral, BID WC, Rise Patience, MD, 50 mg at 07/14/21 0858   felodipine (PLENDIL) 24 hr tablet 5 mg, 5 mg, Oral, BID, Rise Patience, MD, 5 mg at 07/14/21 0958   heparin ADULT infusion 100 units/mL (25000 units/248m), 2,250 Units/hr, Intravenous, Continuous, JAngela Adam RPH, Last Rate: 22.5 mL/hr at 07/14/21 0342, 2,250 Units/hr at 07/14/21 0342   HYDROcodone-acetaminophen (NORCO/VICODIN)  5-325 MG per tablet 1 tablet, 1 tablet, Oral, Q6H PRN, KRise Patience MD   insulin aspart (novoLOG) injection 0-9 Units, 0-9 Units, Subcutaneous, TID WC, KRise Patience MD, 2 Units at 07/14/21 1241   isosorbide-hydrALAZINE (BIDIL) 20-37.5 MG per tablet 2 tablet, 2 tablet, Oral, TID, KRise Patience MD, 2 tablet at 07/14/21 0859   losartan (COZAAR) tablet 100 mg, 100 mg, Oral, QPM, KRise Patience MD   simvastatin (ZOCOR) tablet 40 mg, 40 mg, Oral, QPM, KRise Patience MD   spironolactone (ALDACTONE) tablet 50 mg, 50 mg, Oral, Daily, KRise Patience MD, 50 mg at 07/14/21 [id number]  vitamin B-12 (CYANOCOBALAMIN) tablet 500 mcg, 500 mcg, Oral, Daily, KRise Patience MD, 500 mcg at 07/14/21 0859   Significant Hospital Events: Including procedures, antibiotic start and stop dates in addition to other pertinent events   x  Interim History / Subjective:  x  Objective   Blood  pressure 125/76, pulse 72, temperature 98.6 F (37 C), temperature source Oral, resp. rate 19, height '6\' 2"'$  (1.88 m), weight 135.9 kg, SpO2 98 %.        Intake/Output Summary (Last 24 hours) at 07/14/2021 1315 Last data filed at 07/14/2021 0940 Gross per 24 hour  Intake 679.31 ml  Output 650 ml  Net 29.31 ml   Filed Weights   07/13/21 1236 07/13/21 1720  Weight: (!) 139.3 kg 135.9 kg    Examination: General: Obese male sitting on the chair.  Talking and pleasant HENT: Mallampati class III.  No palpable neck nodes.  No J elevated JVP  Lungs: Clear to auscultation bilaterally but right lower lobe air entry is diminished Cardiovascular: Normal heart sounds Abdomen: Obese soft nontender Extremities: No cyanosis no clubbing or edema Neuro: Alert and oriented x3 GU: Not examined  Resolved Hospital Problem list   X  Assessment & Plan:  Acute problems present on admission -Worsening shortness of breath x2 weeks - Right pleuritic chest pain x3-4 days - Right lower lobe  infarct present on CT angiogram chest but no obvious evidence of PE -Hemoptysis x1 day prior to admission -DVT ruled out.  Troponin and BNP are normal  Ddx -still high probably for PE.  Other differential diagnosis includes respiratory viral infection, streptococcal and Legionella infection, heart failure and autoimmune disease  Plan - Check urine strep, urine Legionella - Check procalcitonin and repeat lactic acid - Check respiratory virus panel - Check echocardiogram - Get VQ scan and also check serum D-dimer - Check ANA, rheumatoid factor, CCP and double-stranded DNA -Continue oxygen for pulse ox goal greater than 92% -Continue IV heparin infusion for presumed PE   PCCM will follow discussed with Dr. Kathline [facility] (right click and "Reselect all SmartList Selections" daily)   According to the hospitalist   SIGNATURE    Dr. Brand Males, M.D., F.C.C.P,  Pulmonary and Critical Care Medicine Staff Physician, [place] Director - Interstitial Lung Disease  Program  Pulmonary [place] [facility] [place], [place], [address]  NPI Number:  NPI [id number]  Pager: [phone], If no answer  -> Check AMION or Try [phone] Telephone (clinical office): [phone] Telephone (research): [phone]  2:04 PM 07/14/2021    LABS    PULMONARY No results for input(s): PHART, PCO2ART, PO2ART, HCO3, TCO2, O2SAT in the last 168 hours.  Invalid input(s): PCO2, PO2  CBC Recent Labs  Lab 07/13/21 1339 07/13/21 2234 07/14/21 0122  HGB 13.6 13.1 12.9*  HCT 42.4 40.8 40.5  WBC 9.8 8.9 8.6  PLT 160 170 156    COAGULATION Recent Labs  Lab 07/13/21 1814 07/13/21 2234  INR 1.1 1.0    CARDIAC  No results for input(s): TROPONINI in the last 168 hours. No results for input(s): PROBNP in the last 168 hours.   CHEMISTRY Recent Labs  Lab 07/13/21 1339 07/14/21 0122  NA 135 134*  K 4.3 3.5  CL 96*  97*  CO2 30 28  GLUCOSE 113* 152*  BUN 16 17  CREATININE 1.35* 1.29*  CALCIUM 9.1 8.4*   Estimated Creatinine Clearance: 67 mL/min (A) (by C-G formula based on SCr of 1.29 mg/dL (H)).   LIVER Recent Labs  Lab 07/13/21 1814 07/13/21 2234 07/14/21 0122  AST  --   --  16  ALT  --   --  14  ALKPHOS  --   --  32*  BILITOT  --   --  1.2  PROT  --   --  7.2  ALBUMIN  --   --  3.6  INR 1.1 1.0  --      INFECTIOUS No results for input(s): LATICACIDVEN, PROCALCITON in the last 168 hours.   ENDOCRINE CBG (last 3)  Recent Labs    07/14/21 0827 07/14/21 1216  GLUCAP 129* 200*         IMAGING x48h  - image(s) personally visualized  -   highlighted in bold CT Angio Chest PE W and/or Wo Contrast  Result Date: 07/13/2021 CLINICAL DATA:  Shortness of breath and RIGHT flank pain for 3 days. PE suspected. EXAM: CT ANGIOGRAPHY CHEST WITH CONTRAST TECHNIQUE: Multidetector CT imaging of the chest was performed using the standard protocol during bolus administration of intravenous contrast. Multiplanar CT image reconstructions and MIPs were obtained to evaluate the vascular anatomy. CONTRAST:  70m OMNIPAQUE IOHEXOL 350 MG/ML SOLN COMPARISON:  Chest CT dated 01/15/2021. FINDINGS: Cardiovascular: Some of the most peripheral segmental and subsegmental pulmonary artery branches are difficult to characterize due to patient breathing motion artifact and suboptimal contrast opacification, however, there is no pulmonary embolism identified within the main, lobar or central segmental pulmonary arteries bilaterally. No thoracic aortic aneurysm or evidence of aortic dissection. Mild aortic atherosclerosis. Scattered coronary artery calcifications. No pericardial effusion. Mediastinum/Nodes: No mass or enlarged lymph nodes are seen within the mediastinum. Esophagus is unremarkable. Trachea and central bronchi are unremarkable. Lungs/Pleura: Patchy consolidations at each lung base, some component chronic,  some components being new including a wedge-shaped consolidation abutting the pleura of the RIGHT lower lobe with surrounding gland glass opacity (series 9, image 98), suspicious for pulmonary infarct. Additional small new consolidations within the LEFT lower lobe. Upper Abdomen: No acute findings.  Cholelithiasis. Musculoskeletal: No acute-appearing osseous abnormality. Review of the MIP images confirms the above findings. IMPRESSION: 1. No central obstructing pulmonary embolism identified. 2. However, there is an acute wedge-shaped consolidation abutting the pleura of the RIGHT lower lobe with surrounding groundglass opacity, suspicious for pulmonary infarct. No pulmonary embolism identified, but this pulmonary finding is suspicious for occult PE within a peripheral subsegmental branch to the RIGHT lower lobe. 3. Additional patchy consolidations at the bilateral lung bases, atelectasis versus multifocal pneumonia. 4. Cholelithiasis without evidence of acute cholecystitis. Aortic Atherosclerosis (ICD10-I70.0). Electronically Signed   By: SFranki CabotM.D.   On: 07/13/2021 16:39   DG Chest Port 1 View  Result Date: 07/13/2021 CLINICAL DATA:  Pleuritic chest pain.  Hemoptysis. EXAM: PORTABLE CHEST 1 VIEW COMPARISON:  May 22, 2016 FINDINGS: The cardiomediastinal silhouette is stable. Elevation of the right hemidiaphragm is stable. No pneumothorax. No nodules or masses. No focal infiltrates. Mild atelectasis in the left base. No other acute abnormalities. IMPRESSION: No active disease. Electronically Signed   By: DDorise BullionIII M.D.   On: 07/13/2021 14:47   VAS [place]OWER EXTREMITY VENOUS (DVT)  Result Date: 07/14/2021  Lower Venous DVT Study Patient Name:  JOZELL STROJNY Date of Exam:   07/14/2021 Medical Rec #: [id number]      Accession #:    [id number] of Birth: [birth year]       Patient Gender: M Patient Age:   89years [facility]      VAS [place]OWER EXTREMITY  VENOUS (DVT) Referring Phys: AGean Birchwood--------------------------------------------------------------------------------  Indications: Pulmonary embolism.  Limitations: Poor ultrasound/tissue interface and body habitus.  Comparison Study: no prior Performing Technologist: Archie Patten RVS  Examination Guidelines: A complete evaluation includes B-mode imaging, spectral Doppler, color Doppler, and power Doppler as needed of all accessible portions of each vessel. Bilateral testing is considered an integral part of a complete examination. Limited examinations for reoccurring indications may be performed as noted. The reflux portion of the exam is performed with the patient in reverse Trendelenburg.  +---------+---------------+---------+-----------+----------+-------------------+ RIGHT    CompressibilityPhasicitySpontaneityPropertiesThrombus Aging      +---------+---------------+---------+-----------+----------+-------------------+ CFV      Full           Yes      Yes                                      +---------+---------------+---------+-----------+----------+-------------------+ SFJ      Full                                                             +---------+---------------+---------+-----------+----------+-------------------+ FV Prox  Full                                                             +---------+---------------+---------+-----------+----------+-------------------+ FV Mid   Full                                                             +---------+---------------+---------+-----------+----------+-------------------+ FV Distal               Yes      Yes                                      +---------+---------------+---------+-----------+----------+-------------------+ PFV      Full                                                             +---------+---------------+---------+-----------+----------+-------------------+ POP      Full            Yes      Yes                                      +---------+---------------+---------+-----------+----------+-------------------+ PTV      Full                                                             +---------+---------------+---------+-----------+----------+-------------------+  PERO                                                  Not well visualized +---------+---------------+---------+-----------+----------+-------------------+   +---------+---------------+---------+-----------+----------+--------------+ LEFT     CompressibilityPhasicitySpontaneityPropertiesThrombus Aging +---------+---------------+---------+-----------+----------+--------------+ CFV      Full           Yes      Yes                                 +---------+---------------+---------+-----------+----------+--------------+ SFJ      Full                                                        +---------+---------------+---------+-----------+----------+--------------+ FV Prox  Full                                                        +---------+---------------+---------+-----------+----------+--------------+ FV Mid   Full                                                        +---------+---------------+---------+-----------+----------+--------------+ FV DistalFull                                                        +---------+---------------+---------+-----------+----------+--------------+ PFV      Full                                                        +---------+---------------+---------+-----------+----------+--------------+ POP      Full           Yes      Yes                                 +---------+---------------+---------+-----------+----------+--------------+ PTV      Full                                                        +---------+---------------+---------+-----------+----------+--------------+ PERO     Full                                                         +---------+---------------+---------+-----------+----------+--------------+  Summary: BILATERAL: - No evidence of deep vein thrombosis seen in the lower extremities, bilaterally. -No evidence of popliteal cyst, bilaterally.   *See table(s) above for measurements and observations.    Preliminary

## 2021-07-14 NOTE — Progress Notes (Signed)
Placed patient on CPAP for the night with pressure of 15cm and oxygen set at 4lpm.

## 2021-07-15 ENCOUNTER — Inpatient Hospital Stay (HOSPITAL_COMMUNITY): Payer: Medicare HMO

## 2021-07-15 DIAGNOSIS — E119 Type 2 diabetes mellitus without complications: Secondary | ICD-10-CM

## 2021-07-15 DIAGNOSIS — I2699 Other pulmonary embolism without acute cor pulmonale: Secondary | ICD-10-CM | POA: Diagnosis not present

## 2021-07-15 LAB — GLUCOSE, CAPILLARY
Glucose-Capillary: 129 mg/dL — ABNORMAL HIGH (ref 70–99)
Glucose-Capillary: 121 mg/dL — ABNORMAL HIGH (ref 70–99)
Glucose-Capillary: 142 mg/dL — ABNORMAL HIGH (ref 70–99)
Glucose-Capillary: 101 mg/dL — ABNORMAL HIGH (ref 70–99)

## 2021-07-15 LAB — CBC
HCT: 39.3 % (ref 39.0–52.0)
Hemoglobin: 12.4 g/dL — ABNORMAL LOW (ref 13.0–17.0)
MCH: 28 pg (ref 26.0–34.0)
MCHC: 31.6 g/dL (ref 30.0–36.0)
MCV: 88.7 fL (ref 80.0–100.0)
Platelets: 148 10*3/uL — ABNORMAL LOW (ref 150–400)
RBC: 4.43 MIL/uL (ref 4.22–5.81)
RDW: 14.4 % (ref 11.5–15.5)
WBC: 6.8 10*3/uL (ref 4.0–10.5)
nRBC: 0 % (ref 0.0–0.2)

## 2021-07-15 LAB — HEPARIN LEVEL (UNFRACTIONATED)
Heparin Unfractionated: 0.69 IU/mL (ref 0.30–0.70)
Heparin Unfractionated: 0.86 IU/mL — ABNORMAL HIGH (ref 0.30–0.70)

## 2021-07-15 LAB — PROCALCITONIN: Procalcitonin: <0.1 ng/mL

## 2021-07-15 LAB — PROTIME-INR
INR: 1.1 (ref 0.8–1.2)
Prothrombin Time: 13.9 seconds (ref 11.4–15.2)

## 2021-07-15 LAB — STREP PNEUMONIAE URINARY ANTIGEN: Strep Pneumo Urinary Antigen: NEGATIVE

## 2021-07-15 MED ORDER — APIXABAN 5 MG PO TABS: 5.0000 mg | ORAL_TABLET | Freq: Two times a day (BID) | ORAL | Status: DC

## 2021-07-15 MED ORDER — TECHNETIUM TO 99M ALBUMIN AGGREGATED
4.4000 | Freq: Once | INTRAVENOUS | Status: AC
  Administered 2021-07-15: 4.4 via INTRAVENOUS

## 2021-07-15 MED ORDER — APIXABAN 5 MG PO TABS
10.0000 mg | ORAL_TABLET | Freq: Two times a day (BID) | ORAL | Status: DC
  Administered 2021-07-15 – 2021-07-16 (×2): 10 mg via ORAL
  Filled 2021-07-15 (×2): qty 2

## 2021-07-15 NOTE — Progress Notes (Signed)
   07/15/21 1100  Mobility  Activity Sat and stood x 3 (3 times)  Level of Assistance Contact guard assist, steadying assist  Assistive Device None  Distance Ambulated (ft) 0 ft  Mobility  (stand to sit)  Mobility Response Tolerated well  Mobility performed by Mobility specialist  $Mobility charge 1 Mobility    Upon entering, pt stated he was about to head to X-ray per MD. Agreed to sitting on Sob and doing stand to sit exercises until X-ray came. Complete 2 rounds of 5 stand to sits. In between round, utilized pursed lip breathing to catch his breath. He did note some SOB, but stated it "is not as bed as before". His O2 sat was between 93%-96% the entire time while on 4L [place]. No other complaints.    [place] Specialist Acute Rehab Services Office: [phone]

## 2021-07-15 NOTE — Progress Notes (Signed)
PROGRESS NOTE    Bradley Alvarez  [id number] DOB: 1941/02/13 DOA: 07/13/2021 PCP: [name], MD    Brief Narrative:  Bradley Alvarez is an 80 year old male with past medical history significant for essential hypertension, OSA, type 2 diabetes mellitus, CKD stage IIIb, who presents to [facility] ED with chest pain over the last 3-4 days.  Pain localized to the right side, pleuritic in nature.  No recent travel or surgeries.  Denies productive cough, no fever/chills.  No sick contacts.  In the ED, CT angiogram chest shows a right sided wedge consolidation concerning for pulmonary infarction with likely occult pulmonary embolism.  High sensitive troponin and BNP were within normal limits.  Cova-19 PCR negative.  Patient was started on IV heparin.   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  Denies any recent travel, no surgeries.  No lower extremity edema.  CT angiogram chest notable for no central obstructing PE however acute wedge-shaped consolidation abutting the pleura of the right lower lobe suspicious for pulmonary infarct, likely secondary to occult PE.  Patient was initially started on a heparin drip.  PCCM was consulted for further evaluation and recommendations.  Vascular duplex ultrasound negative for DVT lower extremities.  TTE with LVEF [id number], grade 1 diastolic dysfunction, moderate TR. nuclear medicine VQ scan with no evidence of pulmonary embolism with chronic elevation right diaphragm with questionable pulmonary arterial hypertension.  Procalcitonin negative, low suspicion for infectious process.  Given findings of pulmonary infarct, pulmonology believes this is related to an occult  PE that could be in stages of resolution and recommend transitioning heparin drip to NOAC. --ANA, RF, CCP, dsDNA: Pending. --Pharmacy consulted to transition heparin drip to Eliquis --Continue submental oxygen, maintain SPO2 greater than 92%; on 4.5 L nasal cannula --Ambulatory O2 screening tomorrow morning --Plan outpatient follow-up with pulmonology  OSA: Continue nocturnal CPAP  Essential hypertension: --Carvedilol 50 mg p.o. twice daily --Felodipine 5 mg p.o. twice daily --Losartan 1 mg p.o. nightly --Spironolactone 50 mg p.o. daily --Isosorbide hydralazine 20-37.5 mg p.o. 2 tabs TID  Aortic atherosclerosis --Continue statin  Hyperlipidemia: Simvastatin 40 mg p.o. daily  Abdominal/thoracic aortic aneurysm: --Continue aspirin and statin --Outpatient follow-up with cardiology, Dr. Virgina Jock   DVT prophylaxis: Heparin drip   Code Status: Full Code Family Communication: Son present at bedside  Disposition Plan:  Level of care: Telemetry Status is: Inpatient  Remains inpatient appropriate because:IV treatments appropriate due to intensity of illness or inability to take PO and Inpatient level of care appropriate due to severity of illness  Dispo: The patient is from: Home              Anticipated d/c is to: Home              Patient currently is not medically stable to d/c.   Difficult to place patient No  Consultants:  PCCM, Dr. Silas Flood, signed off 8/15  Procedures:  None  Antimicrobials:  None   Subjective: Patient seen examined bedside, resting comfortably.  Son present.  On heparin drip.  Awaiting nuclear medicine VQ scan at this afternoon.  No other specific complaints or concerns at this time.  Continues on supple oxygen, not oxygen dependent at baseline.  Denies headache, no fever/chills/night sweats, no nausea cefonicid diarrhea, no current chest pain, no palpitations, no abdominal pain.  No acute events overnight per nursing staff.  Objective: Vitals:    07/15/21 0502 07/15/21 0818 07/15/21 1355 07/15/21 1658  BP: 121/67 (!) 112/55 118/75 138/77  Pulse: 60  68   Resp: 16  19   Temp: 98.3 F (36.8 C)  98.2 F (36.8 C)   TempSrc: Oral  Oral   SpO2: 92%  99%   Weight:      Height:        Intake/Output Summary (Last 24 hours) at 07/15/2021 1834 Last data filed at 07/15/2021 1346 Gross per 24 hour  Intake 472 ml  Output 450 ml  Net 22 ml   Filed Weights   07/13/21 1236 07/13/21 1720  Weight: (!) 139.3 kg 135.9 kg    Examination:  General exam: Appears calm and comfortable  Respiratory system: Clear to auscultation. Respiratory effort normal.  On 4.5 L nasal cannula with SPO2 94% at rest Cardiovascular system: S1 & S2 heard, RRR. No JVD, murmurs, rubs, gallops or clicks. No pedal edema. Gastrointestinal system: Abdomen is nondistended, soft and nontender. No organomegaly or masses felt. Normal bowel sounds heard. Central nervous system: Alert and oriented. No focal neurological deficits. Extremities: Symmetric 5 x 5 power. Skin: No rashes, lesions or ulcers Psychiatry: Judgement and insight appear normal. Mood & affect appropriate.     Data Reviewed: I have personally reviewed following labs and imaging studies  CBC: Recent Labs  Lab 07/13/21 1339 07/13/21 2234 07/14/21 0122 07/15/21 0440  WBC 9.8 8.9 8.6 6.8  NEUTROABS 6.3  --   --   --   HGB 13.6 13.1 12.9* 12.4*  HCT 42.4 40.8 40.5 39.3  MCV 86.7 88.5 88.4 88.7  PLT 160 170 156 [id number]*   Basic Metabolic Panel: Recent Labs  Lab 07/13/21 1339 07/14/21 0122  NA 135 134*  K 4.3 3.5  CL 96* 97*  CO2 30 28  GLUCOSE 113* 152*  BUN 16 17  CREATININE 1.35* 1.29*  CALCIUM 9.1 8.4*   GFR: Estimated Creatinine Clearance: 67 mL/min (A) (by C-G formula based on SCr of 1.29 mg/dL (H)). Liver Function Tests: Recent Labs  Lab 07/14/21 0122  AST 16  ALT 14  ALKPHOS 32*  BILITOT 1.2  PROT 7.2  ALBUMIN 3.6   No results for input(s): LIPASE, AMYLASE in the last  168 hours. No results for input(s): AMMONIA in the last 168 hours. Coagulation Profile: Recent Labs  Lab 07/13/21 1814 07/13/21 2234 07/15/21 0440  INR 1.1 1.0 1.1   Cardiac Enzymes: No results for input(s): CKTOTAL, CKMB, CKMBINDEX, TROPONINI in the last 168 hours. BNP (last 3 results) No results for input(s): PROBNP in the last 8760 hours. HbA1C: No results for input(s): HGBA1C in the last 72 hours. CBG: Recent Labs  Lab 07/14/21 1609 07/14/21 2035 07/15/21 0720 07/15/21 1111 07/15/21 1642  GLUCAP 121* 155* 129* 121* 142*   Lipid Profile: No results for input(s): CHOL, HDL, LDLCALC, TRIG, CHOLHDL, LDLDIRECT in the last 72 hours. Thyroid Function Tests: No results for input(s): TSH, T4TOTAL, FREET4, T3FREE, THYROIDAB in the last 72 hours. Anemia Panel: No results for input(s): VITAMINB12, FOLATE, FERRITIN, TIBC, IRON, RETICCTPCT in the last 72 hours. Sepsis Labs: Recent Labs  Lab 07/14/21 1328 07/14/21 1331 07/15/21 0440  PROCALCITON  --  <0.10 <0.10  LATICACIDVEN 0.9  --   --     Recent Results (from the past 240 hour(s))  Resp Panel by RT-PCR (Flu A&B, Covid) Nasopharyngeal Swab     Status: None   Collection Time: 07/13/21  2:03 PM   Specimen: Nasopharyngeal Swab; Nasopharyngeal(NP) swabs in vial transport medium  Result Value Ref Range Status   SARS Coronavirus 2 by RT PCR NEGATIVE NEGATIVE Final    Comment: (NOTE) SARS-CoV-2 target nucleic acids are NOT DETECTED.  The SARS-CoV-2 RNA is generally detectable in upper respiratory specimens during the acute phase of infection. The lowest concentration of SARS-CoV-2 viral copies this assay can detect is 138 copies/mL. A negative result does not preclude SARS-Cov-2 infection and should not be used as the sole basis for treatment or other patient management decisions. A negative result may occur with  improper specimen collection/handling, submission of specimen other than nasopharyngeal swab, presence of viral  mutation(s) within the areas targeted by this assay, and inadequate number of viral copies(<138 copies/mL). A negative result must be combined with clinical observations, patient history, and epidemiological information. The expected result is Negative.  Fact Sheet for Patients:  EntrepreneurPulse.com.au  Fact Sheet for Healthcare Providers:  IncredibleEmployment.be  This test is no t yet approved or cleared by the [place] FDA and  has been authorized for detection and/or diagnosis of SARS-CoV-2 by FDA under an Emergency Use Authorization (EUA). This EUA will remain  in effect (meaning this test can be used) for the duration of the COVID-19 declaration under Section 564(b)(1) of the Act, 21 U.S.C.section 360bbb-3(b)(1), unless the authorization is terminated  or revoked sooner.       Influenza A by PCR NEGATIVE NEGATIVE Final   Influenza B by PCR NEGATIVE NEGATIVE Final    Comment: (NOTE) The Xpert Xpress SARS-CoV-2/FLU/RSV plus assay is intended as an aid in the diagnosis of influenza from Nasopharyngeal swab specimens and should not be used as a sole basis for treatment. Nasal washings and aspirates are unacceptable for Xpert Xpress SARS-CoV-2/FLU/RSV testing.  Fact Sheet for Patients: EntrepreneurPulse.com.au  Fact Sheet for Healthcare Providers: IncredibleEmployment.be  This test is not yet approved or cleared by the [place] FDA and has been authorized for detection and/or diagnosis of SARS-CoV-2 by FDA under an Emergency Use Authorization (EUA). This EUA will remain in effect (meaning this test can be used) for the duration of the COVID-19 declaration under Section 564(b)(1) of the Act, 21 U.S.C. section 360bbb-3(b)(1), unless the authorization is terminated or revoked.  Performed at [facility], [address], [place], [place] [address]   Respiratory (~20  pathogens) panel by PCR     Status: None   Collection Time: 07/14/21  2:45 PM   Specimen: Nasopharyngeal Swab; Respiratory  Result Value Ref Range Status   Adenovirus NOT DETECTED NOT DETECTED Final   Coronavirus 229E NOT DETECTED NOT DETECTED Final    Comment: (NOTE) The Coronavirus on the Respiratory Panel, DOES NOT test for the novel  Coronavirus (2019 nCoV)    Coronavirus HKU1 NOT DETECTED NOT DETECTED Final   Coronavirus NL63 NOT DETECTED NOT DETECTED Final   Coronavirus OC43 NOT DETECTED NOT DETECTED Final   Metapneumovirus NOT DETECTED NOT DETECTED Final   Rhinovirus / Enterovirus NOT DETECTED NOT DETECTED Final   Influenza A NOT DETECTED NOT DETECTED Final   Influenza B NOT DETECTED NOT DETECTED Final   Parainfluenza Virus 1 NOT DETECTED NOT DETECTED Final   Parainfluenza Virus 2 NOT DETECTED NOT DETECTED Final   Parainfluenza Virus 3 NOT DETECTED NOT DETECTED Final   Parainfluenza Virus 4 NOT DETECTED NOT DETECTED Final  Respiratory Syncytial Virus NOT DETECTED NOT DETECTED Final   Bordetella pertussis NOT DETECTED NOT DETECTED Final   Bordetella Parapertussis NOT DETECTED NOT DETECTED Final   Chlamydophila pneumoniae NOT DETECTED NOT DETECTED Final   Mycoplasma pneumoniae NOT DETECTED NOT DETECTED Final    Comment: Performed at [facility], [place] [address], [place], [place] [address]         Radiology Studies: NM Pulmonary Perfusion  Result Date: 07/15/2021 CLINICAL DATA:  Shortness of breath, chest pain, hemoptysis EXAM: NUCLEAR MEDICINE PERFUSION LUNG SCAN TECHNIQUE: Perfusion images were obtained in multiple projections after intravenous injection of radiopharmaceutical. Ventilation scans intentionally deferred if perfusion scan and chest x-ray adequate for interpretation during COVID 19 epidemic. RADIOPHARMACEUTICALS:  4.4 mCi Tc-24mMAA IV COMPARISON:  12/23/2015 Correlation: Chest radiograph chest radiograph 07/15/2021 FINDINGS: Elevation of RIGHT diaphragm,  chronic. No perfusion defects identified. Reversal of anteroposterior perfusion gradient, raising question of pulmonary arterial hypertension. IMPRESSION: No evidence of pulmonary embolism. Chronic elevation RIGHT diaphragm. Reversal of anteroposterior perfusion gradient question pulmonary arterial hypertension. Electronically Signed   By: MLavonia DanaM.D.   On: 07/15/2021 15:12   DG CHEST PORT 1 VIEW  Result Date: 07/15/2021 CLINICAL DATA:  Right-sided chest pain. EXAM: PORTABLE CHEST 1 VIEW COMPARISON:  02/10/2021 and CT chest 07/13/2021. FINDINGS: Trachea is midline. Heart is enlarged, as before. Lungs are somewhat low in volume with an elevated right hemidiaphragm, as before. No airspace consolidation or pleural fluid. IMPRESSION: No acute findings. Electronically Signed   By: MLorin PicketM.D.   On: 07/15/2021 11:12   ECHOCARDIOGRAM COMPLETE  Result Date: 07/14/2021    ECHOCARDIOGRAM REPORT   Patient Name:   JPRISH TRIMPERDate of Exam: 07/14/2021 Medical Rec #:  [id number]     Height:       74.0 in Accession #:    [id number]    Weight:       299.6 lb Date of Birth:  [birth year]      BSA:          2.581 m Patient Age:    89years       BP:           131/85 mmHg Patient Gender: M              HR:           72 bpm. Exam Location:  Inpatient Procedure: 2D Echo, Cardiac Doppler, Color Doppler and Intracardiac            Opacification Agent Indications:     Pulmonary Embolus I26.09  History:         Patient has prior history of Echocardiogram examinations, most                  recent 09/21/2020. Risk Factors:Diabetes, Hypertension,                  Dyslipidemia and Sleep Apnea. AAA. Chronic kidney disease.  Sonographer:     TDarlina SicilianRDCS Referring Phys:  [id number] PatienceDiagnosing Phys: MVernell LeepMD IMPRESSIONS  1. Left ventricular ejection fraction, by estimation, is 50 to 55%. The left ventricle has low normal function. The left ventricle has no regional wall motion abnormalities.  Left ventricular diastolic parameters are consistent with Grade I diastolic dysfunction (impaired relaxation).  2. Right ventricular systolic function is normal. The right ventricular size is mildly enlarged.  3. The mitral valve is grossly normal. No evidence of  mitral valve regurgitation.  4. Tricuspid valve regurgitation is mild to moderate. IVC not well visualized to estimate PASP.  5. The aortic valve is tricuspid. Aortic valve regurgitation is not visualized.  6. Aortic dilatation noted. Aneurysm of the ascending aorta, measuring 43 mm.  7. Compared to previous outpatient study in 2021, AI, MR not well appreciated. TR appears new. FINDINGS  Left Ventricle: Left ventricular ejection fraction, by estimation, is 50 to 55%. The left ventricle has low normal function. The left ventricle has no regional wall motion abnormalities. Definity contrast agent was given IV to delineate the left ventricular endocardial borders. The left ventricular internal cavity size was normal in size. There is no left ventricular hypertrophy. Left ventricular diastolic parameters are consistent with Grade I diastolic dysfunction (impaired relaxation). Right Ventricle: The right ventricular size is mildly enlarged. No increase in right ventricular wall thickness. Right ventricular systolic function is normal. Left Atrium: Left atrial size was normal in size. Right Atrium: Right atrial size was normal in size. Pericardium: There is no evidence of pericardial effusion. Mitral Valve: The mitral valve is grossly normal. No evidence of mitral valve regurgitation. Tricuspid Valve: The tricuspid valve is grossly normal. Tricuspid valve regurgitation is mild to moderate. Aortic Valve: The aortic valve is tricuspid. Aortic valve regurgitation is not visualized. Pulmonic Valve: The pulmonic valve was grossly normal. Pulmonic valve regurgitation is not visualized. Aorta: Aortic dilatation noted. There is an aneurysm involving the ascending aorta  measuring 43 mm. Venous: The inferior vena cava was not well visualized. IAS/Shunts: No atrial level shunt detected by color flow Doppler.  LEFT VENTRICLE PLAX 2D LVIDd:         5.90 cm      Diastology LVIDs:         4.50 cm      LV e' medial:    5.85 cm/s LV PW:         1.50 cm      LV E/e' medial:  9.9 LV IVS:        1.50 cm      LV e' lateral:   6.24 cm/s LVOT diam:     2.50 cm      LV E/e' lateral: 9.3 LV SV:         107 LV SV Index:   42 LVOT Area:     4.91 cm  LV Volumes (MOD) LV vol d, MOD A4C: 134.0 ml LV SV MOD A4C:     134.0 ml RIGHT VENTRICLE RV S prime:     9.92 cm/s TAPSE (M-mode): 2.5 cm LEFT ATRIUM             Index       RIGHT ATRIUM           Index LA diam:        4.50 cm 1.74 cm/m  RA Area:     11.70 cm LA Vol (A2C):   31.9 ml 12.36 ml/m RA Volume:   17.60 ml  6.82 ml/m LA Vol (A4C):   58.2 ml 22.55 ml/m LA Biplane Vol: 45.1 ml 17.48 ml/m  AORTIC VALVE LVOT Vmax:   115.00 cm/s LVOT Vmean:  72.250 cm/s LVOT VTI:    0.218 m  AORTA Ao Root diam: 4.50 cm Ao Asc diam:  4.30 cm MITRAL VALVE MV Area (PHT): 2.90 cm    SHUNTS MV Decel Time: 261 msec    Systemic VTI:  0.22 m MV E velocity: 58.03 cm/s  Systemic Diam: 2.50 cm  MV A velocity: 89.97 cm/s MV E/A ratio:  0.65 Vernell Leep MD Electronically signed by Vernell Leep MD Signature Date/Time: 07/14/2021/3:12:03 PM    Final    VAS [place] LOWER EXTREMITY VENOUS (DVT)  Result Date: 07/14/2021  Lower Venous DVT Study Patient Name:  Bradley Alvarez  Date of Exam:   07/14/2021 Medical Rec #: [id number]       Accession #:    [id number] Date of Birth: November 13, 1941        Patient Gender: M Patient Age:   36 years Exam Location:  [facility] Procedure:      VAS [place] LOWER EXTREMITY VENOUS (DVT) Referring Phys: Gean Birchwood --------------------------------------------------------------------------------  Indications: Pulmonary embolism.  Limitations: Poor ultrasound/tissue interface and body habitus. Comparison Study: no prior Performing  Technologist: Archie Patten RVS  Examination Guidelines: A complete evaluation includes B-mode imaging, spectral Doppler, color Doppler, and power Doppler as needed of all accessible portions of each vessel. Bilateral testing is considered an integral part of a complete examination. Limited examinations for reoccurring indications may be performed as noted. The reflux portion of the exam is performed with the patient in reverse Trendelenburg.  +---------+---------------+---------+-----------+----------+-------------------+ RIGHT    CompressibilityPhasicitySpontaneityPropertiesThrombus Aging      +---------+---------------+---------+-----------+----------+-------------------+ CFV      Full           Yes      Yes                                      +---------+---------------+---------+-----------+----------+-------------------+ SFJ      Full                                                             +---------+---------------+---------+-----------+----------+-------------------+ FV Prox  Full                                                             +---------+---------------+---------+-----------+----------+-------------------+ FV Mid   Full                                                             +---------+---------------+---------+-----------+----------+-------------------+ FV Distal               Yes      Yes                                      +---------+---------------+---------+-----------+----------+-------------------+ PFV      Full                                                             +---------+---------------+---------+-----------+----------+-------------------+  POP      Full           Yes      Yes                                      +---------+---------------+---------+-----------+----------+-------------------+ PTV      Full                                                              +---------+---------------+---------+-----------+----------+-------------------+ PERO                                                  Not well visualized +---------+---------------+---------+-----------+----------+-------------------+   +---------+---------------+---------+-----------+----------+--------------+ LEFT     CompressibilityPhasicitySpontaneityPropertiesThrombus Aging +---------+---------------+---------+-----------+----------+--------------+ CFV      Full           Yes      Yes                                 +---------+---------------+---------+-----------+----------+--------------+ SFJ      Full                                                        +---------+---------------+---------+-----------+----------+--------------+ FV Prox  Full                                                        +---------+---------------+---------+-----------+----------+--------------+ FV Mid   Full                                                        +---------+---------------+---------+-----------+----------+--------------+ FV DistalFull                                                        +---------+---------------+---------+-----------+----------+--------------+ PFV      Full                                                        +---------+---------------+---------+-----------+----------+--------------+ POP      Full           Yes      Yes                                 +---------+---------------+---------+-----------+----------+--------------+  PTV      Full                                                        +---------+---------------+---------+-----------+----------+--------------+ PERO     Full                                                        +---------+---------------+---------+-----------+----------+--------------+     Summary: BILATERAL: - No evidence of deep vein thrombosis seen in the lower extremities, bilaterally.  -No evidence of popliteal cyst, bilaterally.   *See table(s) above for measurements and observations. Electronically signed by Harold Barban MD on 07/14/2021 at 3:29:02 PM.    Final         Scheduled Meds:  aspirin EC  81 mg Oral Daily   carvedilol  50 mg Oral BID WC   felodipine  5 mg Oral BID   insulin aspart  0-9 Units Subcutaneous TID WC   isosorbide-hydrALAZINE  2 tablet Oral TID   losartan  100 mg Oral QPM   simvastatin  40 mg Oral QPM   spironolactone  50 mg Oral Daily   vitamin B-12  500 mcg Oral Daily   Continuous Infusions:  heparin 2,250 Units/hr (07/15/21 1358)     LOS: 2 days    Time spent: 45 minutes spent on chart review, discussion with nursing staff, consultants, updating family and interview/physical exam; more than 50% of that time was spent in counseling and/or coordination of care.    Lelend Heinecke J [place] DO [facility] Available via Epic secure chat 7am-7pm After these hours, please refer to coverage provider listed on [email or web address] 07/15/2021, 6:34 PM

## 2021-07-15 NOTE — TOC Benefit Eligibility Note (Signed)
Transition of Care (TOC) Benefit Eligibility Note    Patient Details  Name: Bradley Alvarez MRN: [id number] Date of Birth: 07/28/1941   Medication/Dose: Lovenos 100 mg sq daily  Covered?: Yes  Tier: Other  Prescription Coverage Preferred Pharmacy: local  Spoke with Person/Company/Phone Number:: Megan/ [facility] [employer]  Co-Pay: requires prior auth and that dept. will give copay  Prior Approval: Yes  Deductible: Met  Additional Notes: prior auth [phone] opt. 1    Fuller, Malinda Phone Number: 07/15/2021, 2:38 PM     

## 2021-07-15 NOTE — Progress Notes (Signed)
NAME:  Bradley Alvarez, MRN:  [id number], DOB:  [birth year], LOS: 2 ADMISSION DATE:  07/13/2021, CONSULTATION DATE:  07/14/21 REFERRING MD:  Dr Nolberto Hanlon, CHIEF COMPLAINT:  pleurisy, RLL infarct, Acute hypoxemic resp faiure   History of Present Illness: Provided by the patient his daughter and also review of the records   80 year old male with hypertension, obesity, sleep apnea, type 2 diabetes, CKD stage III,.  Echo in 2017 with normal ejection fraction grade 1 diastolic dysfunction and mild RV dilatation.  2015 normal nuclear medicine stress test by Dr. Claiborne Billings.  Apparently 2 weeks prior to admission he suddenly decided to run after the mailman.  This was an extraordinary exertion and he was extremely short of breath.  He says he felt like his lungs were going to give out and hurt.  No chest pain but he had to stop because of extreme dyspnea.  Since then he said progressive worsening of dyspnea to the point that several days before admission he could hardly walk to the bathroom and back.  Then 3 or 4 nights prior to admission he developed pleuritic right infra axillary chest pain that persisted and then associated with worsening dyspnea on exertion no fever no edema.  No orthopnea no wheezing.  1 day prior to admission had streaky amount of hemoptysis x1.  This resulted in the ER admission.  No D-dimer available.  Troponin normal BNP normal no lactic acid available.  CT angiogram chest shows right lower lobe infarct but no obvious pulmonary embolism.  Echocardiogram pending.  His COVID and flu PCR is negative.  He is noticed to be needing 2-4 L of nasal cannula  Duplex LE - 8/14 - No DVT    Pertinent  Medical History    has a past medical history of AAA (abdominal aortic aneurysm) without rupture ([place]), Benign essential HTN, Chronic combined systolic (congestive) and diastolic (congestive) heart failure ([place]), CKD (chronic kidney disease), stage III ([place]), Diabetes mellitus type 2, insulin dependent  ([place]), Diabetic neuropathy ([place]), DOE (dyspnea on exertion), Edema of both lower extremities, Full dentures, HOH (hard of hearing), Hydrocele, bilateral, Lichen planus, Mixed hyperlipidemia, Morbid obesity with BMI of 45.0-49.9, adult ([place]), Nocturia, OA (osteoarthritis), and OSA on CPAP (10/14/08).   reports that he quit smoking about 49 years ago. His smoking use included cigarettes. He has a 15.00 pack-year smoking history. He has quit using smokeless tobacco.  His smokeless tobacco use included snuff and chew.  Past Surgical History:  Procedure Laterality Date   CARDIAC CATHETERIZATION  per pt yrs ago   was told no blockage   CARDIOVASCULAR STRESS TEST  07-19-2014   dr Shelva Majestic   Low risk nuclear study w/ small apical attentuation artifact/  normal LV function and wall motion , ef 53%   CATARACT EXTRACTION W/ INTRAOCULAR LENS  IMPLANT, BILATERAL  2018   COLONOSCOPY W/ BIOPSIES AND POLYPECTOMY     COLONOSCOPY WITH PROPOFOL N/A 05/16/2016   Procedure: COLONOSCOPY WITH PROPOFOL;  Surgeon: Carol Ada, MD;  Location: [facility];  Service: Endoscopy;  Laterality: N/A;   HYDROCELE EXCISION Bilateral 11/15/2018   Procedure: HYDROCELECTOMY ADULT;  Surgeon: Kathie Rhodes, MD;  Location: [facility];  Service: Urology;  Laterality: Bilateral;   KNEE ARTHROSCOPY Right 2009   MULTIPLE TOOTH EXTRACTIONS     ORIF ANKLE FRACTURE Left 09/13/2014   Procedure: Open Reduction Internal Fixation Left Ankle Medial Malleolus;  Surgeon: Newt Minion, MD;  Location: [facility] [facility];  Service: Orthopedics;  Laterality:  Left;   PROSTATECTOMY  1993 approx.  by dr Diona Fanti   for prostate enlargement   TOTAL KNEE ARTHROPLASTY Right 08/27/2009   dr Noemi Chapel '@[facility]    TOTAL KNEE ARTHROPLASTY  12/20/2012   Procedure: TOTAL KNEE ARTHROPLASTY;  Surgeon: Lorn Junes, MD;  Location: [facility];  Service: Orthopedics;  Laterality: Left;  left total knee arthroplasty   TRANSTHORACIC ECHOCARDIOGRAM  11-04-2018   dr  patwardhan   moderate to severe concentric LVH, ef 56%,  grade 1 diastolic dysfunciton/  mild AV calcification annulus and leaflets without stentosis ,  mild regurg./  moderate LAE/ dilated aortic root, 4.5cm/  mild MV annulus calcification with trace regurg./  trace TR    No Known Allergies   There is no immunization history on file for this patient.  Family History  Problem Relation Age of Onset   Hypertension Mother    Diabetes Mellitus II Mother    Heart attack Father    Diabetes Mellitus II Sister    Heart disease Sister    Diabetes Mellitus II Brother    Stroke Brother    Diabetes Mellitus II Brother    Kidney disease Brother    Diabetes Mellitus II Brother    Diabetes Mellitus II Sister    Heart disease Sister    Hypertension Other    Diabetes Mellitus II Other    Stroke Other    Heart disease Other      Current Facility-Administered Medications:    acetaminophen (TYLENOL) tablet 650 mg, 650 mg, Oral, Q6H PRN **OR** acetaminophen (TYLENOL) suppository 650 mg, 650 mg, Rectal, Q6H PRN, Rise Patience, MD   aspirin EC tablet 81 mg, 81 mg, Oral, Daily, Rise Patience, MD, 81 mg at 07/15/21 0818   carvedilol (COREG) tablet 50 mg, 50 mg, Oral, BID WC, Rise Patience, MD, 50 mg at 07/15/21 0818   felodipine (PLENDIL) 24 hr tablet 5 mg, 5 mg, Oral, BID, Rise Patience, MD, 5 mg at 07/15/21 0818   heparin ADULT infusion 100 units/mL (25000 units/276m), 2,250 Units/hr, Intravenous, Continuous, [place] EDonnamarie Poag DO, Last Rate: 22.5 mL/hr at 07/15/21 1358, 2,250 Units/hr at 07/15/21 1358   HYDROcodone-acetaminophen (NORCO/VICODIN) 5-325 MG per tablet 1 tablet, 1 tablet, Oral, Q6H PRN, KRise Patience MD   insulin aspart (novoLOG) injection 0-9 Units, 0-9 Units, Subcutaneous, TID WC, KRise Patience MD, 1 Units at 07/15/21 1135   isosorbide-hydrALAZINE (BIDIL) 20-37.5 MG per tablet 2 tablet, 2 tablet, Oral, TID, KRise Patience MD, 2 tablet at  07/15/21 0818   losartan (COZAAR) tablet 100 mg, 100 mg, Oral, QPM, KRise Patience MD, 100 mg at 07/14/21 1645   simvastatin (ZOCOR) tablet 40 mg, 40 mg, Oral, QPM, KRise Patience MD, 40 mg at 07/14/21 1645   spironolactone (ALDACTONE) tablet 50 mg, 50 mg, Oral, Daily, KRise Patience MD, 50 mg at 07/15/21 0818   vitamin B-12 (CYANOCOBALAMIN) tablet 500 mcg, 500 mcg, Oral, Daily, KRise Patience MD, 500 mcg at 07/15/21 0[facility]  Significant Hospital Events: Including procedures, antibiotic start and stop dates in addition to other pertinent events   8/15 VQ negative, D dimer elevated  Interim History / Subjective:  Doing ok. Chest pain resolved.   Objective   Blood pressure 118/75, pulse 68, temperature 98.2 F (36.8 C), temperature source Oral, resp. rate 19, height '6\' 2"'$  (1.88 m), weight 135.9 kg, SpO2 99 %.        Intake/Output Summary (Last 24 hours) at 07/15/2021  [place] filed at 07/15/2021 1346 Gross per 24 hour  Intake 768.18 ml  Output 450 ml  Net 318.18 ml    Filed Weights   07/13/21 1236 07/13/21 1720  Weight: (!) 139.3 kg 135.9 kg    Examination: General: Obese male sitting on the chair.  Talking and pleasant Lungs: Clear to auscultation bilaterally but right lower lobe air entry is diminished Cardiovascular: Normal heart sounds,  Abdomen: Obese, soft, nontender Extremities: No cyanosis no clubbing or edema Neuro: Alert and oriented x3, no deficits  Resolved Hospital Problem list    Assessment & Plan:  Pulmonary infarct with chest pain, hemoptysis: Suspect related to small or now resolved PE not seen on imaging (CTA or VQ). Procalcitonin negative. Low suspicion for infection. --Continue AC, ok to transition fro hep gtt to [place] --Will arrange outpatient f/u with pulmonary   We will sign off.   Best Practice (right click and "Reselect all SmartList Selections" daily)  Per primary   SIGNATURE    Lanier Clam,  MD See Amion for contact info  4:51 PM 07/15/2021    LABS    PULMONARY No results for input(s): PHART, PCO2ART, PO2ART, HCO3, TCO2, O2SAT in the last 168 hours.  Invalid input(s): PCO2, PO2  CBC Recent Labs  Lab 07/13/21 2234 07/14/21 0122 07/15/21 0440  HGB 13.1 12.9* 12.4*  HCT 40.8 40.5 39.3  WBC 8.9 8.6 6.8  PLT 170 156 148*     COAGULATION Recent Labs  Lab 07/13/21 1814 07/13/21 2234 07/15/21 0440  INR 1.1 1.0 1.1     CARDIAC  No results for input(s): TROPONINI in the last 168 hours. No results for input(s): PROBNP in the last 168 hours.   CHEMISTRY Recent Labs  Lab 07/13/21 1339 07/14/21 0122  NA 135 134*  K 4.3 3.5  CL 96* 97*  CO2 30 28  GLUCOSE 113* 152*  BUN 16 17  CREATININE 1.35* 1.29*  CALCIUM 9.1 8.4*    Estimated Creatinine Clearance: 67 mL/min (A) (by C-G formula based on SCr of 1.29 mg/dL (H)).   LIVER Recent Labs  Lab 07/13/21 1814 07/13/21 2234 07/14/21 0122 07/15/21 0440  AST  --   --  16  --   ALT  --   --  14  --   ALKPHOS  --   --  32*  --   BILITOT  --   --  1.2  --   PROT  --   --  7.2  --   ALBUMIN  --   --  3.6  --   INR 1.1 1.0  --  1.1      INFECTIOUS Recent Labs  Lab 07/14/21 1328 07/14/21 1331 07/15/21 0440  LATICACIDVEN 0.9  --   --   PROCALCITON  --  <0.10 <0.10     ENDOCRINE CBG (last 3)  Recent Labs    07/15/21 0720 07/15/21 1111 07/15/21 1642  GLUCAP 129* 121* 142*          IMAGING x48h  - image(s) personally visualized  -   highlighted in bold NM Pulmonary Perfusion  Result Date: 07/15/2021 CLINICAL DATA:  Shortness of breath, chest pain, hemoptysis EXAM: NUCLEAR MEDICINE PERFUSION LUNG SCAN TECHNIQUE: Perfusion images were obtained in multiple projections after intravenous injection of radiopharmaceutical. Ventilation scans intentionally deferred if perfusion scan and chest x-ray adequate for interpretation during COVID 19 epidemic. RADIOPHARMACEUTICALS:  4.4 mCi Tc-66m MAA IV COMPARISON:  12/23/2015 Correlation: Chest radiograph chest radiograph 07/15/2021  FINDINGS: Elevation of RIGHT diaphragm, chronic. No perfusion defects identified. Reversal of anteroposterior perfusion gradient, raising question of pulmonary arterial hypertension. IMPRESSION: No evidence of pulmonary embolism. Chronic elevation RIGHT diaphragm. Reversal of anteroposterior perfusion gradient question pulmonary arterial hypertension. Electronically Signed   By: Lavonia Dana M.D.   On: 07/15/2021 15:12   DG CHEST PORT 1 VIEW  Result Date: 07/15/2021 CLINICAL DATA:  Right-sided chest pain. EXAM: PORTABLE CHEST 1 VIEW COMPARISON:  02/10/2021 and CT chest 07/13/2021. FINDINGS: Trachea is midline. Heart is enlarged, as before. Lungs are somewhat low in volume with an elevated right hemidiaphragm, as before. No airspace consolidation or pleural fluid. IMPRESSION: No acute findings. Electronically Signed   By: Lorin Picket M.D.   On: 07/15/2021 11:12   ECHOCARDIOGRAM COMPLETE  Result Date: 07/14/2021    ECHOCARDIOGRAM REPORT   Patient Name:   Bradley Alvarez Date of Exam: 07/14/2021 Medical Rec #:  [id number]      Height:       74.0 in Accession #:    [id number]     Weight:       299.6 lb Date of Birth:  [birth year]       BSA:          2.581 m Patient Age:    61 years       BP:           131/85 mmHg Patient Gender: M              HR:           72 bpm. Exam Location:  Inpatient Procedure: 2D Echo, Cardiac Doppler, Color Doppler and Intracardiac            Opacification Agent Indications:     Pulmonary Embolus I26.09  History:         Patient has prior history of Echocardiogram examinations, most                  recent 09/21/2020. Risk Factors:Diabetes, Hypertension,                  Dyslipidemia and Sleep Apnea. AAA. Chronic kidney disease.  Sonographer:     Darlina Sicilian RDCS Referring Phys:  [id number] Rise Patience Diagnosing Phys: Vernell Leep MD IMPRESSIONS  1. Left ventricular ejection fraction, by  estimation, is 50 to 55%. The left ventricle has low normal function. The left ventricle has no regional wall motion abnormalities. Left ventricular diastolic parameters are consistent with Grade I diastolic dysfunction (impaired relaxation).  2. Right ventricular systolic function is normal. The right ventricular size is mildly enlarged.  3. The mitral valve is grossly normal. No evidence of mitral valve regurgitation.  4. Tricuspid valve regurgitation is mild to moderate. IVC not well visualized to estimate PASP.  5. The aortic valve is tricuspid. Aortic valve regurgitation is not visualized.  6. Aortic dilatation noted. Aneurysm of the ascending aorta, measuring 43 mm.  7. Compared to previous outpatient study in 2021, AI, MR not well appreciated. TR appears new. FINDINGS  Left Ventricle: Left ventricular ejection fraction, by estimation, is 50 to 55%. The left ventricle has low normal function. The left ventricle has no regional wall motion abnormalities. Definity contrast agent was given IV to delineate the left ventricular endocardial borders. The left ventricular internal cavity size was normal in size. There is no left ventricular hypertrophy. Left ventricular diastolic parameters are consistent with Grade I diastolic dysfunction (impaired relaxation). Right Ventricle: The right ventricular size  is mildly enlarged. No increase in right ventricular wall thickness. Right ventricular systolic function is normal. Left Atrium: Left atrial size was normal in size. Right Atrium: Right atrial size was normal in size. Pericardium: There is no evidence of pericardial effusion. Mitral Valve: The mitral valve is grossly normal. No evidence of mitral valve regurgitation. Tricuspid Valve: The tricuspid valve is grossly normal. Tricuspid valve regurgitation is mild to moderate. Aortic Valve: The aortic valve is tricuspid. Aortic valve regurgitation is not visualized. Pulmonic Valve: The pulmonic valve was grossly normal.  Pulmonic valve regurgitation is not visualized. Aorta: Aortic dilatation noted. There is an aneurysm involving the ascending aorta measuring 43 mm. Venous: The inferior vena cava was not well visualized. IAS/Shunts: No atrial level shunt detected by color flow Doppler.  LEFT VENTRICLE PLAX 2D LVIDd:         5.90 cm      Diastology LVIDs:         4.50 cm      LV e' medial:    5.85 cm/s LV PW:         1.50 cm      LV E/e' medial:  9.9 LV IVS:        1.50 cm      LV e' lateral:   6.24 cm/s LVOT diam:     2.50 cm      LV E/e' lateral: 9.3 LV SV:         107 LV SV Index:   42 LVOT Area:     4.91 cm  LV Volumes (MOD) LV vol d, MOD A4C: 134.0 ml LV SV MOD A4C:     134.0 ml RIGHT VENTRICLE RV S prime:     9.92 cm/s TAPSE (M-mode): 2.5 cm LEFT ATRIUM             Index       RIGHT ATRIUM           Index LA diam:        4.50 cm 1.74 cm/m  RA Area:     11.70 cm LA Vol (A2C):   31.9 ml 12.36 ml/m RA Volume:   17.60 ml  6.82 ml/m LA Vol (A4C):   58.2 ml 22.55 ml/m LA Biplane Vol: 45.1 ml 17.48 ml/m  AORTIC VALVE LVOT Vmax:   115.00 cm/s LVOT Vmean:  72.250 cm/s LVOT VTI:    0.218 m  AORTA Ao Root diam: 4.50 cm Ao Asc diam:  4.30 cm MITRAL VALVE MV Area (PHT): 2.90 cm    SHUNTS MV Decel Time: 261 msec    Systemic VTI:  0.22 m MV E velocity: 58.03 cm/s  Systemic Diam: 2.50 cm MV A velocity: 89.97 cm/s MV E/A ratio:  0.65 Manish Patwardhan MD Electronically signed by Vernell Leep MD Signature Date/Time: 07/14/2021/3:12:03 PM    Final    VAS [place] LOWER EXTREMITY VENOUS (DVT)  Result Date: 07/14/2021  Lower Venous DVT Study Patient Name:  Bradley Alvarez  Date of Exam:   07/14/2021 Medical Rec #: [id number]       Accession #:    [id number] Date of Birth: [birth year]        Patient Gender: M Patient Age:   65 years Exam Location:  [facility] Procedure:      VAS [place] LOWER EXTREMITY VENOUS (DVT) Referring Phys: Gean Birchwood --------------------------------------------------------------------------------   Indications: Pulmonary embolism.  Limitations: Poor ultrasound/tissue interface and body habitus. Comparison Study: no prior Performing Technologist: Archie Patten RVS  Examination Guidelines: A complete evaluation includes B-mode imaging, spectral Doppler, color Doppler, and power Doppler as needed of all accessible portions of each vessel. Bilateral testing is considered an integral part of a complete examination. Limited examinations for reoccurring indications may be performed as noted. The reflux portion of the exam is performed with the patient in reverse Trendelenburg.  +---------+---------------+---------+-----------+----------+-------------------+ RIGHT    CompressibilityPhasicitySpontaneityPropertiesThrombus Aging      +---------+---------------+---------+-----------+----------+-------------------+ CFV      Full           Yes      Yes                                      +---------+---------------+---------+-----------+----------+-------------------+ SFJ      Full                                                             +---------+---------------+---------+-----------+----------+-------------------+ FV Prox  Full                                                             +---------+---------------+---------+-----------+----------+-------------------+ FV Mid   Full                                                             +---------+---------------+---------+-----------+----------+-------------------+ FV Distal               Yes      Yes                                      +---------+---------------+---------+-----------+----------+-------------------+ PFV      Full                                                             +---------+---------------+---------+-----------+----------+-------------------+ POP      Full           Yes      Yes                                       +---------+---------------+---------+-----------+----------+-------------------+ PTV      Full                                                             +---------+---------------+---------+-----------+----------+-------------------+ PERO  Not well visualized +---------+---------------+---------+-----------+----------+-------------------+   +---------+---------------+---------+-----------+----------+--------------+ LEFT     CompressibilityPhasicitySpontaneityPropertiesThrombus Aging +---------+---------------+---------+-----------+----------+--------------+ CFV      Full           Yes      Yes                                 +---------+---------------+---------+-----------+----------+--------------+ SFJ      Full                                                        +---------+---------------+---------+-----------+----------+--------------+ FV Prox  Full                                                        +---------+---------------+---------+-----------+----------+--------------+ FV Mid   Full                                                        +---------+---------------+---------+-----------+----------+--------------+ FV DistalFull                                                        +---------+---------------+---------+-----------+----------+--------------+ PFV      Full                                                        +---------+---------------+---------+-----------+----------+--------------+ POP      Full           Yes      Yes                                 +---------+---------------+---------+-----------+----------+--------------+ PTV      Full                                                        +---------+---------------+---------+-----------+----------+--------------+ PERO     Full                                                         +---------+---------------+---------+-----------+----------+--------------+     Summary: BILATERAL: - No evidence of deep vein thrombosis seen in the lower extremities, bilaterally. -No evidence of popliteal cyst, bilaterally.   *See table(s) above for measurements and observations. Electronically signed by Harold Barban MD  on 07/14/2021 at 3:29:02 PM.    Final

## 2021-07-15 NOTE — Progress Notes (Signed)
ANTICOAGULATION CONSULT NOTE - follow up  Pharmacy Consult for Heparin Indication:  VTE Treatment  No Known Allergies  Patient Measurements: Height: '6\' 2"'$  (188 cm) Weight: 135.9 kg (299 lb 9 oz) IBW/kg (Calculated) : 82.2 Heparin Dosing Weight: 113 kg  Vital Signs: Temp: 98.3 F (36.8 C) (08/15 0502) Temp Source: Oral (08/15 0502) BP: 112/55 (08/15 0818) Pulse Rate: 60 (08/15 0502)  Labs: Recent Labs    07/13/21 1339 07/13/21 1539 07/13/21 1814 07/13/21 2234 07/13/21 2234 07/14/21 0122 07/14/21 1135 07/14/21 1328 07/14/21 2301 07/15/21 0440 07/15/21 0813  HGB 13.6  --   --  13.1  --  12.9*  --   --   --  12.4*  --   HCT 42.4  --   --  40.8  --  40.5  --   --   --  39.3  --   PLT 160  --   --  170  --  156  --   --   --  148*  --   LABPROT  --   --  13.7 13.6  --   --   --   --   --  13.9  --   INR  --   --  1.1 1.0  --   --   --   --   --  1.1  --   HEPARINUNFRC  --   --   --   --    < > 0.11* 0.13*  --  0.69  --  0.86*  CREATININE 1.35*  --   --   --   --  1.29*  --   --   --   --   --   TROPONINIHS 6 6  --   --   --   --   --  8  --   --   --    < > = values in this interval not displayed.     Estimated Creatinine Clearance: 67 mL/min (A) (by C-G formula based on SCr of 1.29 mg/dL (H)).   Medical History: Past Medical History:  Diagnosis Date   AAA (abdominal aortic aneurysm) without rupture ([place])    followed by dr Derek Jack--  per office note dated 11-05-2018  3.5cm   Benign essential HTN    cardiologist-- dr Margart Sickles   Chronic combined systolic (congestive) and diastolic (congestive) heart failure [facility])    cardiologist-- dr Robb Matar   CKD (chronic kidney disease), stage III ([place])    Diabetes mellitus type 2, insulin dependent ([place])    followed by dr Virgina Jock   Diabetic neuropathy ([place])    DOE (dyspnea on exertion)    Edema of both lower extremities    Full dentures    HOH (hard of hearing)    does wear hearing aids   Hydrocele, bilateral     Lichen planus    Mixed hyperlipidemia    Morbid obesity with BMI of 45.0-49.9, adult ([place])    Nocturia    OA (osteoarthritis)    OSA on CPAP 10/14/08   per study  AHI  61.8/hr ,  RDI  96.1/hr.    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 Patient presenting with SOB, chest pain, and cough. Some hemoptysis noted.  CT angio concerning for pulmonary infarct and possible PE although no large central PE noted. Venous doppler negative for DVT. CCM following - still high probability  for PE; VQ scan ordered. Pharmacy consulted to dose/monitor heparin.  07/15/2021 10:13 AM  HL = 0.86 supratherapeutic on 2500 units/hr Per RN pink-tinged sputum reported Hgb stable, PLT 148 but stable  Goal of Therapy:  Heparin level 0.3-0.7 units/ml Monitor platelets by anticoagulation protocol: Yes   Plan:  Decrease heparin infusion to 2250 units/hr Check 8 hour HL HL, CBC daily while on heparin infusion Monitor for signs of bleeding  Napoleon Form  07/15/2021, 10:13 AM

## 2021-07-15 NOTE — Progress Notes (Signed)
ANTICOAGULATION CONSULT NOTE - follow up  Pharmacy Consult for Heparin Indication:  VTE Treatment  No Known Allergies  Patient Measurements: Height: '6\' 2"'$  (188 cm) Weight: 135.9 kg (299 lb 9 oz) IBW/kg (Calculated) : 82.2 Heparin Dosing Weight: 113 kg  Vital Signs: Temp: 98.2 F (36.8 C) (08/14 2034) Temp Source: Oral (08/14 2034) BP: 115/62 (08/14 2034) Pulse Rate: 85 (08/14 2220)  Labs: Recent Labs    07/13/21 1339 07/13/21 1539 07/13/21 1814 07/13/21 2234 07/14/21 0122 07/14/21 1135 07/14/21 1328 07/14/21 2301  HGB 13.6  --   --  13.1 12.9*  --   --   --   HCT 42.4  --   --  40.8 40.5  --   --   --   PLT 160  --   --  170 156  --   --   --   LABPROT  --   --  13.7 13.6  --   --   --   --   INR  --   --  1.1 1.0  --   --   --   --   HEPARINUNFRC  --   --   --   --  0.11* 0.13*  --  0.69  CREATININE 1.35*  --   --   --  1.29*  --   --   --   TROPONINIHS 6 6  --   --   --   --  8  --      Estimated Creatinine Clearance: 67 mL/min (A) (by C-G formula based on SCr of 1.29 mg/dL (H)).   Medical History: Past Medical History:  Diagnosis Date   AAA (abdominal aortic aneurysm) without rupture ([place])    followed by dr Derek Jack--  per office note dated 11-05-2018  3.5cm   Benign essential HTN    cardiologist-- dr Margart Sickles   Chronic combined systolic (congestive) and diastolic (congestive) heart failure [facility])    cardiologist-- dr Robb Matar   CKD (chronic kidney disease), stage III ([place])    Diabetes mellitus type 2, insulin dependent ([place])    followed by dr Virgina Jock   Diabetic neuropathy ([place])    DOE (dyspnea on exertion)    Edema of both lower extremities    Full dentures    HOH (hard of hearing)    does wear hearing aids   Hydrocele, bilateral    Lichen planus    Mixed hyperlipidemia    Morbid obesity with BMI of 45.0-49.9, adult ([place])    Nocturia    OA (osteoarthritis)    OSA on CPAP 10/14/08   per study  AHI  61.8/hr ,  RDI  96.1/hr.    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 Patient presenting with SOB, chest pain, and cough. Some hemoptysis noted.  CT angio concerning for pulmonary infarct and possible PE although no large central PE noted. Venous doppler negative for DVT. CCM following - still high probability for PE; VQ scan ordered. Pharmacy consulted to dose/monitor heparin.  07/15/2021 HL = 0.69 therapeutic on 2500 units/hr Per RN no bleeding  Goal of Therapy:  Heparin level 0.3-0.7 units/ml Monitor platelets by anticoagulation protocol: Yes   Plan:  Continue heparin infusion at 2500 units/hr Check 8 hour HL HL, CBC daily while on heparin infusion Monitor for signs of bleeding  Dolly Rias RPh 07/15/2021, 12:18 AM

## 2021-07-16 DIAGNOSIS — I2699 Other pulmonary embolism without acute cor pulmonale: Secondary | ICD-10-CM | POA: Diagnosis not present

## 2021-07-16 DIAGNOSIS — G4733 Obstructive sleep apnea (adult) (pediatric): Secondary | ICD-10-CM | POA: Diagnosis not present

## 2021-07-16 LAB — CBC
HCT: 38 % — ABNORMAL LOW (ref 39.0–52.0)
Hemoglobin: 12 g/dL — ABNORMAL LOW (ref 13.0–17.0)
MCH: 28 pg (ref 26.0–34.0)
MCHC: 31.6 g/dL (ref 30.0–36.0)
MCV: 88.6 fL (ref 80.0–100.0)
Platelets: 181 10*3/uL (ref 150–400)
RBC: 4.29 MIL/uL (ref 4.22–5.81)
RDW: 14.3 % (ref 11.5–15.5)
WBC: 5.7 10*3/uL (ref 4.0–10.5)
nRBC: 0 % (ref 0.0–0.2)

## 2021-07-16 LAB — GLUCOSE, CAPILLARY
Glucose-Capillary: 114 mg/dL — ABNORMAL HIGH (ref 70–99)
Glucose-Capillary: 124 mg/dL — ABNORMAL HIGH (ref 70–99)

## 2021-07-16 LAB — LEGIONELLA PNEUMOPHILA SEROGP 1 UR AG: L. pneumophila Serogp 1 Ur Ag: NEGATIVE

## 2021-07-16 LAB — CYCLIC CITRUL PEPTIDE ANTIBODY, IGG/IGA: CCP Antibodies IgG/IgA: 6 units (ref 0–19)

## 2021-07-16 LAB — RHEUMATOID FACTOR: Rheumatoid fact SerPl-aCnc: 21.1 IU/mL — ABNORMAL HIGH (ref <14.0)

## 2021-07-16 LAB — PROCALCITONIN: Procalcitonin: <0.1 ng/mL

## 2021-07-16 LAB — ANTI-DNA ANTIBODY, DOUBLE-STRANDED: ds DNA Ab: <1 IU/mL (ref 0–9)

## 2021-07-16 MED ORDER — APIXABAN 5 MG PO TABS: ORAL_TABLET | ORAL | 0 refills | Status: DC

## 2021-07-16 NOTE — Discharge Instructions (Signed)
Information on my medicine - ELIQUIS (apixaban)  This medication education was reviewed with me or my healthcare representative as part of my discharge preparation.  The pharmacist that spoke with me during my hospital stay was:  Napoleon Form [facility]  Why was Eliquis prescribed for you? Eliquis was prescribed to treat blood clots that may have been found in the veins of your legs (deep vein thrombosis) or in your lungs (pulmonary embolism) and to reduce the risk of them occurring again.  What do You need to know about Eliquis ? The starting dose is 10 mg (two 5 mg tablets) taken TWICE daily for the FIRST SEVEN (7) DAYS, then on 8/22  the dose is reduced to ONE 5 mg tablet taken TWICE daily (with the evening dose).  Eliquis may be taken with or without food.   Try to take the dose about the same time in the morning and in the evening. If you have difficulty swallowing the tablet whole please discuss with your pharmacist how to take the medication safely.  Take Eliquis exactly as prescribed and DO NOT stop taking Eliquis without talking to the doctor who prescribed the medication.  Stopping may increase your risk of developing a new blood clot.  Refill your prescription before you run out.  After discharge, you should have regular check-up appointments with your healthcare provider that is prescribing your Eliquis.    What do you do if you miss a dose? If a dose of ELIQUIS is not taken at the scheduled time, take it as soon as possible on the same day and twice-daily administration should be resumed. The dose should not be doubled to make up for a missed dose.  Important Safety Information A possible side effect of Eliquis is bleeding. You should call your healthcare provider right away if you experience any of the following: Bleeding from an injury or your nose that does not stop. Unusual colored urine (red or dark brown) or unusual colored stools (red or black). Unusual bruising  for unknown reasons. A serious fall or if you hit your head (even if there is no bleeding).  Some medicines may interact with Eliquis and might increase your risk of bleeding or clotting while on Eliquis. To help avoid this, consult your healthcare provider or pharmacist prior to using any new prescription or non-prescription medications, including herbals, vitamins, non-steroidal anti-inflammatory drugs (NSAIDs) and supplements.  This website has more information on Eliquis (apixaban): http://www.eliquis.com/eliquis/home

## 2021-07-16 NOTE — Progress Notes (Addendum)
SATURATION QUALIFICATIONS: (This note is used to comply with regulatory documentation for home oxygen)  Patient Saturations on Room Air at Rest = 98%  Patient Saturations on Room Air while Ambulating = 92%  Patient Saturations on 3 Liters of oxygen while Ambulating = 99%  Please briefly explain why patient needs home oxygen: At rest pt might not need it, but while ambulating he gets short of breath without oxygen and it drops to 92%. So, he needs oxygen while walking. He did good while walking with oxygen.

## 2021-07-16 NOTE — Discharge Summary (Signed)
Physician Discharge Summary  Bradley Alvarez [id number] DOB: 22-Mar-1941 DOA: 07/13/2021  PCP: [name], MD  Admit date: 07/13/2021 Discharge date: 07/16/2021  Admitted From: Home Disposition: Home  Recommendations for Outpatient Follow-up:  Follow up with PCP in 1-2 weeks Follow-up with pulmonology, Dr. Kavin Leech clinic to make outpatient appointments Started on Eliquis for presumed pulmonary embolism leading to pulmonary infarction Follow-up ANA, rheumatoid factor, anti-DNA antibody, CCP which was pending at time of discharge.  Home Health: No Equipment/Devices: O2, 2 L per nasal cannula  Discharge Condition: Stable CODE STATUS: Full code Diet recommendation: Heart healthy/consistent carbohydrate diet  History of present illness:  Bradley Alvarez is an 80 year old male with past medical history significant for essential hypertension, OSA, type 2 diabetes mellitus, CKD stage IIIb, who presents to [facility] ED with chest pain over the last 3-4 days.  Pain localized to the right side, pleuritic in nature.  No recent travel or surgeries.  Denies productive cough, no fever/chills.  No sick contacts.   In the ED, CT angiogram chest shows a right sided wedge consolidation concerning for pulmonary infarction with likely occult pulmonary embolism.  High sensitive troponin and BNP were within normal limits.  Cova-19 PCR negative.  Patient was started on IV heparin.  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  Denies any recent travel, no surgeries.  No lower extremity edema.  CT angiogram chest notable for no central obstructing PE however acute wedge-shaped consolidation abutting the pleura of the right lower lobe suspicious for pulmonary infarct, likely secondary to occult PE.  Patient was initially started on a heparin drip.  PCCM was  consulted for further evaluation and recommendations.  Vascular duplex ultrasound negative for DVT lower extremities.  TTE with LVEF [id number], grade 1 diastolic dysfunction, moderate TR. nuclear medicine VQ scan with no evidence of pulmonary embolism with chronic elevation right diaphragm with questionable pulmonary arterial hypertension.  Procalcitonin negative, low suspicion for infectious process.  Given findings of pulmonary infarct, pulmonology believes this is related to an occult PE that could be in stages of resolution and recommend transitioning heparin drip to NOAC.  Heparin drip transition to Eliquis.  Outpatient follow-up with pulmonology, Dr. Kavin Leech clinic to schedule.  Patient was evaluated prior to discharge and will require 2 L nasal cannula to maintain oxygenation.  Social work to set up.  OSA: Continue nocturnal CPAP   Essential hypertension: Continue home carvedilol 50 mg p.o. twice daily, Felodipine 5 mg p.o. twice daily, Losartan 1 mg p.o. nightly, Spironolactone 50 mg p.o. daily, and Isosorbide hydralazine 20-37.5 mg p.o. 2 tabs TID   Aortic atherosclerosis: Continue statin   Hyperlipidemia: Simvastatin 40 mg p.o. daily   Abdominal/thoracic aortic aneurysm: Continue aspirin and statin. Outpatient follow-up with cardiology, Dr. Virgina Jock   Discharge Diagnoses:  Principal Problem:   Pulmonary embolism ([place]) Active Problems:   Diabetes mellitus ([place])   Essential hypertension   Sleep apnea, obstructive    Discharge Instructions  Discharge Instructions     (HEART FAILURE PATIENTS) Call MD:  Anytime you have any of the following symptoms: 1) 3 pound weight gain in 24 hours or 5 pounds in 1 week 2) shortness of breath, with or without a dry hacking cough 3) swelling in the hands, feet or stomach 4) if you have to sleep on extra pillows at night in order to breathe.  Complete by: As directed    Call MD for:  difficulty breathing, headache or visual disturbances    Complete by: As directed    Call MD for:  extreme fatigue   Complete by: As directed    Call MD for:  persistant dizziness or light-headedness   Complete by: As directed    Call MD for:  persistant nausea and vomiting   Complete by: As directed    Call MD for:  severe uncontrolled pain   Complete by: As directed    Call MD for:  temperature >100.4   Complete by: As directed    Diet - low sodium heart healthy   Complete by: As directed    Increase activity slowly   Complete by: As directed       Allergies as of 07/16/2021   No Known Allergies      Medication List     TAKE these medications    apixaban 5 MG Tabs tablet Commonly known as: Eliquis Take 2 tablets ('10mg'$ ) twice daily for 7 days, then 1 tablet ('5mg'$ ) twice daily   aspirin EC 81 MG tablet Take 81 mg by mouth daily.   BiDil 20-37.5 MG tablet Generic drug: isosorbide-hydrALAZINE TAKE 2 TABLETS BY MOUTH THREE TIMES A DAY   carvedilol 25 MG tablet Commonly known as: COREG Take 2 tablets (50 mg total) by mouth 2 (two) times daily with a meal.   clobetasol ointment 0.05 % Commonly known as: TEMOVATE Apply 1 application topically daily as needed (for itching).   Cyanocobalamin 2500 MCG Tabs Take 500 mcg by mouth daily.   diclofenac sodium 1 % Gel Commonly known as: VOLTAREN Apply 1 application topically 4 (four) times daily as needed (For pain.).   Dulaglutide 1.5 MG/0.5ML Sopn Inject 1 Dose into the skin once a week. Wednesday's   felodipine 5 MG 24 hr tablet Commonly known as: PLENDIL Take 5 mg by mouth 2 (two) times daily.   fluticasone 0.005 % ointment Commonly known as: CUTIVATE Apply 1 application topically 2 (two) times daily.   furosemide 20 MG tablet Commonly known as: LASIX TAKE ONE TABLET BY MOUTH ONE TIME DAILY *TAKE 1 TAB TWICE DAILY FOR 3-5 DAYS IF INCREASED LEG SWELLING OR SHORTNESS OF BREATH*   HYDROcodone-acetaminophen 5-325 MG tablet Commonly known as: NORCO/VICODIN 2 (two)  times daily.   loratadine 10 MG tablet Commonly known as: CLARITIN Take 10 mg by mouth every morning.   losartan 100 MG tablet Commonly known as: COZAAR Take 100 mg by mouth every evening.   MEGARED OMEGA-3 KRILL OIL PO Take 1 tablet by mouth daily.   metFORMIN 1000 MG tablet Commonly known as: GLUCOPHAGE Take 1,000 mg by mouth 2 (two) times daily with a meal.   multivitamin with minerals Tabs tablet Take 1 tablet by mouth daily.   OneTouch Verio test strip Generic drug: glucose blood USE TO TEST BID AS DIRECTED   potassium chloride SA 20 MEQ tablet Commonly known as: KLOR-CON   simvastatin 40 MG tablet Commonly known as: ZOCOR Take 40 mg by mouth every evening.   spironolactone 50 MG tablet Commonly known as: ALDACTONE Take 1 tablet by mouth daily.   SYSTANE OP Apply to eye as needed.   Turmeric 400 MG Caps Take by mouth 2 (two) times daily.   urea 10 % cream Commonly known as: CARMOL Apply topically as needed.               Durable Medical Equipment  (From admission,  onward)           Start     Ordered   07/16/21 1119  For home use only DME oxygen  Once       Comments: POC eval  Question Answer Comment  Length of Need 12 Months   Mode or (Route) Nasal cannula   Liters per Minute 2   Frequency Continuous (stationary and portable oxygen unit needed)   Oxygen conserving device Yes   Oxygen delivery system Gas      07/16/21 1118            Follow-up Information     [name], MD. Schedule an appointment as soon as possible for a visit in 1 week(s).   Specialty: Internal Medicine Contact information: [address] [place] [address] [phone]         Hunsucker, Bonna Gains, MD. Schedule an appointment as soon as possible for a visit in 2 week(s).   Specialty: Pulmonary Disease Contact information: [address] [place] [address] [phone]                No Known  Allergies  Consultations: PCCM, Dr. Silas Flood   Procedures/Studies: CT Angio Chest PE W and/or Wo Contrast  Result Date: 07/13/2021 CLINICAL DATA:  Shortness of breath and RIGHT flank pain for 3 days. PE suspected. EXAM: CT ANGIOGRAPHY CHEST WITH CONTRAST TECHNIQUE: Multidetector CT imaging of the chest was performed using the standard protocol during bolus administration of intravenous contrast. Multiplanar CT image reconstructions and MIPs were obtained to evaluate the vascular anatomy. CONTRAST:  80m OMNIPAQUE IOHEXOL 350 MG/ML SOLN COMPARISON:  Chest CT dated 01/15/2021. FINDINGS: Cardiovascular: Some of the most peripheral segmental and subsegmental pulmonary artery branches are difficult to characterize due to patient breathing motion artifact and suboptimal contrast opacification, however, there is no pulmonary embolism identified within the main, lobar or central segmental pulmonary arteries bilaterally. No thoracic aortic aneurysm or evidence of aortic dissection. Mild aortic atherosclerosis. Scattered coronary artery calcifications. No pericardial effusion. Mediastinum/Nodes: No mass or enlarged lymph nodes are seen within the mediastinum. Esophagus is unremarkable. Trachea and central bronchi are unremarkable. Lungs/Pleura: Patchy consolidations at each lung base, some component chronic, some components being new including a wedge-shaped consolidation abutting the pleura of the RIGHT lower lobe with surrounding gland glass opacity (series 9, image 98), suspicious for pulmonary infarct. Additional small new consolidations within the LEFT lower lobe. Upper Abdomen: No acute findings.  Cholelithiasis. Musculoskeletal: No acute-appearing osseous abnormality. Review of the MIP images confirms the above findings. IMPRESSION: 1. No central obstructing pulmonary embolism identified. 2. However, there is an acute wedge-shaped consolidation abutting the pleura of the RIGHT lower lobe with surrounding  groundglass opacity, suspicious for pulmonary infarct. No pulmonary embolism identified, but this pulmonary finding is suspicious for occult PE within a peripheral subsegmental branch to the RIGHT lower lobe. 3. Additional patchy consolidations at the bilateral lung bases, atelectasis versus multifocal pneumonia. 4. Cholelithiasis without evidence of acute cholecystitis. Aortic Atherosclerosis (ICD10-I70.0). Electronically Signed   By: SFranki CabotM.D.   On: 07/13/2021 16:39   NM Pulmonary Perfusion  Result Date: 07/15/2021 CLINICAL DATA:  Shortness of breath, chest pain, hemoptysis EXAM: NUCLEAR MEDICINE PERFUSION LUNG SCAN TECHNIQUE: Perfusion images were obtained in multiple projections after intravenous injection of radiopharmaceutical. Ventilation scans intentionally deferred if perfusion scan and chest x-ray adequate for interpretation during COVID 19 epidemic. RADIOPHARMACEUTICALS:  4.4 mCi Tc-965mAA IV COMPARISON:  12/23/2015 Correlation: Chest radiograph chest  radiograph 07/15/2021 FINDINGS: Elevation of RIGHT diaphragm, chronic. No perfusion defects identified. Reversal of anteroposterior perfusion gradient, raising question of pulmonary arterial hypertension. IMPRESSION: No evidence of pulmonary embolism. Chronic elevation RIGHT diaphragm. Reversal of anteroposterior perfusion gradient question pulmonary arterial hypertension. Electronically Signed   By: Lavonia Dana M.D.   On: 07/15/2021 15:12   DG CHEST PORT 1 VIEW  Result Date: 07/15/2021 CLINICAL DATA:  Right-sided chest pain. EXAM: PORTABLE CHEST 1 VIEW COMPARISON:  02/10/2021 and CT chest 07/13/2021. FINDINGS: Trachea is midline. Heart is enlarged, as before. Lungs are somewhat low in volume with an elevated right hemidiaphragm, as before. No airspace consolidation or pleural fluid. IMPRESSION: No acute findings. Electronically Signed   By: Lorin Picket M.D.   On: 07/15/2021 11:12   DG Chest Port 1 View  Result Date:  07/13/2021 CLINICAL DATA:  Pleuritic chest pain.  Hemoptysis. EXAM: PORTABLE CHEST 1 VIEW COMPARISON:  May 22, 2016 FINDINGS: The cardiomediastinal silhouette is stable. Elevation of the right hemidiaphragm is stable. No pneumothorax. No nodules or masses. No focal infiltrates. Mild atelectasis in the left base. No other acute abnormalities. IMPRESSION: No active disease. Electronically Signed   By: Dorise Bullion III M.D.   On: 07/13/2021 14:47   ECHOCARDIOGRAM COMPLETE  Result Date: 07/14/2021    ECHOCARDIOGRAM REPORT   Patient Name:   Bradley Alvarez Date of Exam: 07/14/2021 Medical Rec #:  [id number]      Height:       74.0 in Accession #:    [id number]     Weight:       299.6 lb Date of Birth:  1941/02/16       BSA:          2.581 m Patient Age:    80 years       BP:           131/85 mmHg Patient Gender: M              HR:           72 bpm. Exam Location:  Inpatient Procedure: 2D Echo, Cardiac Doppler, Color Doppler and Intracardiac            Opacification Agent Indications:     Pulmonary Embolus I26.09  History:         Patient has prior history of Echocardiogram examinations, most                  recent 09/21/2020. Risk Factors:Diabetes, Hypertension,                  Dyslipidemia and Sleep Apnea. AAA. Chronic kidney disease.  Sonographer:     Darlina Sicilian RDCS Referring Phys:  [id number] Rise Patience Diagnosing Phys: Vernell Leep MD IMPRESSIONS  1. Left ventricular ejection fraction, by estimation, is 50 to 55%. The left ventricle has low normal function. The left ventricle has no regional wall motion abnormalities. Left ventricular diastolic parameters are consistent with Grade I diastolic dysfunction (impaired relaxation).  2. Right ventricular systolic function is normal. The right ventricular size is mildly enlarged.  3. The mitral valve is grossly normal. No evidence of mitral valve regurgitation.  4. Tricuspid valve regurgitation is mild to moderate. IVC not well visualized to estimate  PASP.  5. The aortic valve is tricuspid. Aortic valve regurgitation is not visualized.  6. Aortic dilatation noted. Aneurysm of the ascending aorta, measuring 43 mm.  7. Compared to previous outpatient study in 2021, AI, MR not  well appreciated. TR appears new. FINDINGS  Left Ventricle: Left ventricular ejection fraction, by estimation, is 50 to 55%. The left ventricle has low normal function. The left ventricle has no regional wall motion abnormalities. Definity contrast agent was given IV to delineate the left ventricular endocardial borders. The left ventricular internal cavity size was normal in size. There is no left ventricular hypertrophy. Left ventricular diastolic parameters are consistent with Grade I diastolic dysfunction (impaired relaxation). Right Ventricle: The right ventricular size is mildly enlarged. No increase in right ventricular wall thickness. Right ventricular systolic function is normal. Left Atrium: Left atrial size was normal in size. Right Atrium: Right atrial size was normal in size. Pericardium: There is no evidence of pericardial effusion. Mitral Valve: The mitral valve is grossly normal. No evidence of mitral valve regurgitation. Tricuspid Valve: The tricuspid valve is grossly normal. Tricuspid valve regurgitation is mild to moderate. Aortic Valve: The aortic valve is tricuspid. Aortic valve regurgitation is not visualized. Pulmonic Valve: The pulmonic valve was grossly normal. Pulmonic valve regurgitation is not visualized. Aorta: Aortic dilatation noted. There is an aneurysm involving the ascending aorta measuring 43 mm. Venous: The inferior vena cava was not well visualized. IAS/Shunts: No atrial level shunt detected by color flow Doppler.  LEFT VENTRICLE PLAX 2D LVIDd:         5.90 cm      Diastology LVIDs:         4.50 cm      LV e' medial:    5.85 cm/s LV PW:         1.50 cm      LV E/e' medial:  9.9 LV IVS:        1.50 cm      LV e' lateral:   6.24 cm/s LVOT diam:     2.50 cm       LV E/e' lateral: 9.3 LV SV:         107 LV SV Index:   42 LVOT Area:     4.91 cm  LV Volumes (MOD) LV vol d, MOD A4C: 134.0 ml LV SV MOD A4C:     134.0 ml RIGHT VENTRICLE RV S prime:     9.92 cm/s TAPSE (M-mode): 2.5 cm LEFT ATRIUM             Index       RIGHT ATRIUM           Index LA diam:        4.50 cm 1.74 cm/m  RA Area:     11.70 cm LA Vol (A2C):   31.9 ml 12.36 ml/m RA Volume:   17.60 ml  6.82 ml/m LA Vol (A4C):   58.2 ml 22.55 ml/m LA Biplane Vol: 45.1 ml 17.48 ml/m  AORTIC VALVE LVOT Vmax:   115.00 cm/s LVOT Vmean:  72.250 cm/s LVOT VTI:    0.218 m  AORTA Ao Root diam: 4.50 cm Ao Asc diam:  4.30 cm MITRAL VALVE MV Area (PHT): 2.90 cm    SHUNTS MV Decel Time: 261 msec    Systemic VTI:  0.22 m MV E velocity: 58.03 cm/s  Systemic Diam: 2.50 cm MV A velocity: 89.97 cm/s MV E/A ratio:  0.65 Manish Patwardhan MD Electronically signed by Vernell Leep MD Signature Date/Time: 07/14/2021/3:12:03 PM    Final    VAS [place] LOWER EXTREMITY VENOUS (DVT)  Result Date: 07/14/2021  Lower Venous DVT Study Patient Name:  Ancil Linsey  Date of Exam:  07/14/2021 Medical Rec #: [id number]       Accession #:    [id number] Date of Birth: 1941/07/28        Patient Gender: M Patient Age:   56 years Exam Location:  [facility] Procedure:      VAS [place] LOWER EXTREMITY VENOUS (DVT) Referring Phys: Gean Birchwood --------------------------------------------------------------------------------  Indications: Pulmonary embolism.  Limitations: Poor ultrasound/tissue interface and body habitus. Comparison Study: no prior Performing Technologist: Archie Patten RVS  Examination Guidelines: A complete evaluation includes B-mode imaging, spectral Doppler, color Doppler, and power Doppler as needed of all accessible portions of each vessel. Bilateral testing is considered an integral part of a complete examination. Limited examinations for reoccurring indications may be performed as noted. The reflux portion of the exam  is performed with the patient in reverse Trendelenburg.  +---------+---------------+---------+-----------+----------+-------------------+ RIGHT    CompressibilityPhasicitySpontaneityPropertiesThrombus Aging      +---------+---------------+---------+-----------+----------+-------------------+ CFV      Full           Yes      Yes                                      +---------+---------------+---------+-----------+----------+-------------------+ SFJ      Full                                                             +---------+---------------+---------+-----------+----------+-------------------+ FV Prox  Full                                                             +---------+---------------+---------+-----------+----------+-------------------+ FV Mid   Full                                                             +---------+---------------+---------+-----------+----------+-------------------+ FV Distal               Yes      Yes                                      +---------+---------------+---------+-----------+----------+-------------------+ PFV      Full                                                             +---------+---------------+---------+-----------+----------+-------------------+ POP      Full           Yes      Yes                                      +---------+---------------+---------+-----------+----------+-------------------+  PTV      Full                                                             +---------+---------------+---------+-----------+----------+-------------------+ PERO                                                  Not well visualized +---------+---------------+---------+-----------+----------+-------------------+   +---------+---------------+---------+-----------+----------+--------------+ LEFT     CompressibilityPhasicitySpontaneityPropertiesThrombus Aging  +---------+---------------+---------+-----------+----------+--------------+ CFV      Full           Yes      Yes                                 +---------+---------------+---------+-----------+----------+--------------+ SFJ      Full                                                        +---------+---------------+---------+-----------+----------+--------------+ FV Prox  Full                                                        +---------+---------------+---------+-----------+----------+--------------+ FV Mid   Full                                                        +---------+---------------+---------+-----------+----------+--------------+ FV DistalFull                                                        +---------+---------------+---------+-----------+----------+--------------+ PFV      Full                                                        +---------+---------------+---------+-----------+----------+--------------+ POP      Full           Yes      Yes                                 +---------+---------------+---------+-----------+----------+--------------+ PTV      Full                                                        +---------+---------------+---------+-----------+----------+--------------+  PERO     Full                                                        +---------+---------------+---------+-----------+----------+--------------+     Summary: BILATERAL: - No evidence of deep vein thrombosis seen in the lower extremities, bilaterally. -No evidence of popliteal cyst, bilaterally.   *See table(s) above for measurements and observations. Electronically signed by Harold Barban MD on 07/14/2021 at 3:29:02 PM.    Final      Subjective: Patient seen examined at bedside, resting comfortably.  Spouse present.  No complaints this morning.  Continues with mild pleuritic pain on deep inspiration but much improved.  Seen by PCCM and  recommend outpatient follow-up.  Qualified for oxygen this morning.  Ready for discharge home.  Denies headache, no visual changes, no chest pain, palpitations, no shortness of breath, no abdominal pain, no fever/chills/night sweats, no nausea/vomiting/diarrhea, no weakness, no fatigue, no paresthesias.  No acute events overnight per nursing staff.  Discharge Exam: Vitals:   07/16/21 0504 07/16/21 0844  BP: (!) 124/58 125/65  Pulse: 64   Resp:    Temp: 98.5 F (36.9 C) 98.2 F (36.8 C)  SpO2: 93% 98%   Vitals:   07/15/21 1658 07/15/21 1936 07/16/21 0504 07/16/21 0844  BP: 138/77 122/66 (!) 124/58 125/65  Pulse:  62 64   Resp:  20    Temp:  98.8 F (37.1 C) 98.5 F (36.9 C) 98.2 F (36.8 C)  TempSrc:  Oral Axillary Oral  SpO2:  96% 93% 98%  Weight:      Height:        General: Pt is alert, awake, not in acute distress Cardiovascular: RRR, S1/S2 +, no rubs, no gallops Respiratory: CTA bilaterally, no wheezing, no rhonchi, on 2 L nasal cannula Abdominal: Soft, NT, ND, bowel sounds + Extremities: no edema, no cyanosis    The results of significant diagnostics from this hospitalization (including imaging, microbiology, ancillary and laboratory) are listed below for reference.     Microbiology: Recent Results (from the past 240 hour(s))  Resp Panel by RT-PCR (Flu A&B, Covid) Nasopharyngeal Swab     Status: None   Collection Time: 07/13/21  2:03 PM   Specimen: Nasopharyngeal Swab; Nasopharyngeal(NP) swabs in vial transport medium  Result Value Ref Range Status   SARS Coronavirus 2 by RT PCR NEGATIVE NEGATIVE Final    Comment: (NOTE) SARS-CoV-2 target nucleic acids are NOT DETECTED.  The SARS-CoV-2 RNA is generally detectable in upper respiratory specimens during the acute phase of infection. The lowest concentration of SARS-CoV-2 viral copies this assay can detect is 138 copies/mL. A negative result does not preclude SARS-Cov-2 infection and should not be used as the  sole basis for treatment or other patient management decisions. A negative result may occur with  improper specimen collection/handling, submission of specimen other than nasopharyngeal swab, presence of viral mutation(s) within the areas targeted by this assay, and inadequate number of viral copies(<138 copies/mL). A negative result must be combined with clinical observations, patient history, and epidemiological information. The expected result is Negative.  Fact Sheet for Patients:  EntrepreneurPulse.com.au  Fact Sheet for Healthcare Providers:  IncredibleEmployment.be  This test is no t yet approved or cleared by the [place] FDA and  has been authorized for detection and/or diagnosis of  SARS-CoV-2 by FDA under an Emergency Use Authorization (EUA). This EUA will remain  in effect (meaning this test can be used) for the duration of the COVID-19 declaration under Section 564(b)(1) of the Act, 21 U.S.C.section 360bbb-3(b)(1), unless the authorization is terminated  or revoked sooner.       Influenza A by PCR NEGATIVE NEGATIVE Final   Influenza B by PCR NEGATIVE NEGATIVE Final    Comment: (NOTE) The Xpert Xpress SARS-CoV-2/FLU/RSV plus assay is intended as an aid in the diagnosis of influenza from Nasopharyngeal swab specimens and should not be used as a sole basis for treatment. Nasal washings and aspirates are unacceptable for Xpert Xpress SARS-CoV-2/FLU/RSV testing.  Fact Sheet for Patients: EntrepreneurPulse.com.au  Fact Sheet for Healthcare Providers: IncredibleEmployment.be  This test is not yet approved or cleared by the [place] FDA and has been authorized for detection and/or diagnosis of SARS-CoV-2 by FDA under an Emergency Use Authorization (EUA). This EUA will remain in effect (meaning this test can be used) for the duration of the COVID-19 declaration under Section 564(b)(1) of the  Act, 21 U.S.C. section 360bbb-3(b)(1), unless the authorization is terminated or revoked.  Performed at [facility], [address] [place], [place] [address]   Respiratory (~20 pathogens) panel by PCR     Status: None   Collection Time: 07/14/21  2:45 PM   Specimen: Nasopharyngeal Swab; Respiratory  Result Value Ref Range Status   Adenovirus NOT DETECTED NOT DETECTED Final   Coronavirus 229E NOT DETECTED NOT DETECTED Final    Comment: (NOTE) The Coronavirus on the Respiratory Panel, DOES NOT test for the novel  Coronavirus (2019 nCoV)    Coronavirus HKU1 NOT DETECTED NOT DETECTED Final   Coronavirus NL63 NOT DETECTED NOT DETECTED Final   Coronavirus OC43 NOT DETECTED NOT DETECTED Final   Metapneumovirus NOT DETECTED NOT DETECTED Final   Rhinovirus / Enterovirus NOT DETECTED NOT DETECTED Final   Influenza A NOT DETECTED NOT DETECTED Final   Influenza B NOT DETECTED NOT DETECTED Final   Parainfluenza Virus 1 NOT DETECTED NOT DETECTED Final   Parainfluenza Virus 2 NOT DETECTED NOT DETECTED Final   Parainfluenza Virus 3 NOT DETECTED NOT DETECTED Final   Parainfluenza Virus 4 NOT DETECTED NOT DETECTED Final   Respiratory Syncytial Virus NOT DETECTED NOT DETECTED Final   Bordetella pertussis NOT DETECTED NOT DETECTED Final   Bordetella Parapertussis NOT DETECTED NOT DETECTED Final   Chlamydophila pneumoniae NOT DETECTED NOT DETECTED Final   Mycoplasma pneumoniae NOT DETECTED NOT DETECTED Final    Comment: Performed at [facility], [place]. [address], [place], [place] [address]     Labs: BNP (last 3 results) Recent Labs    07/13/21 1339  BNP 0000000   Basic Metabolic Panel: Recent Labs  Lab 07/13/21 1339 07/14/21 0122  NA 135 134*  K 4.3 3.5  CL 96* 97*  CO2 30 28  GLUCOSE 113* 152*  BUN 16 17  CREATININE 1.35* 1.29*  CALCIUM 9.1 8.4*   Liver Function Tests: Recent Labs  Lab 07/14/21 0122  AST 16  ALT 14  ALKPHOS 32*  BILITOT 1.2  PROT  7.2  ALBUMIN 3.6   No results for input(s): LIPASE, AMYLASE in the last 168 hours. No results for input(s): AMMONIA in the last 168 hours. CBC: Recent Labs  Lab 07/13/21 1339 07/13/21 2234 07/14/21 0122 07/15/21 0440 07/16/21 0435  WBC 9.8 8.9 8.6 6.8 5.7  NEUTROABS 6.3  --   --   --   --  HGB 13.6 13.1 12.9* 12.4* 12.0*  HCT 42.4 40.8 40.5 39.3 38.0*  MCV 86.7 88.5 88.4 88.7 88.6  PLT 160 170 156 148* 181   Cardiac Enzymes: No results for input(s): CKTOTAL, CKMB, CKMBINDEX, TROPONINI in the last 168 hours. BNP: Invalid input(s): POCBNP CBG: Recent Labs  Lab 07/15/21 0720 07/15/21 1111 07/15/21 1642 07/15/21 2039 07/16/21 0745  GLUCAP 129* 121* 142* 101* 114*   D-Dimer Recent Labs    07/14/21 1328  DDIMER 1.43*   Hgb A1c No results for input(s): HGBA1C in the last 72 hours. Lipid Profile No results for input(s): CHOL, HDL, LDLCALC, TRIG, CHOLHDL, LDLDIRECT in the last 72 hours. Thyroid function studies No results for input(s): TSH, T4TOTAL, T3FREE, THYROIDAB in the last 72 hours.  Invalid input(s): FREET3 Anemia work up No results for input(s): VITAMINB12, FOLATE, FERRITIN, TIBC, IRON, RETICCTPCT in the last 72 hours. Urinalysis    Component Value Date/Time   COLORURINE YELLOW 05/22/2016 1735   APPEARANCEUR CLOUDY (A) 05/22/2016 1735   LABSPEC 1.009 05/22/2016 1735   PHURINE 5.0 05/22/2016 1735   GLUCOSEU NEGATIVE 05/22/2016 1735   HGBUR TRACE (A) 05/22/2016 1735   BILIRUBINUR NEGATIVE 05/22/2016 1735   KETONESUR NEGATIVE 05/22/2016 1735   PROTEINUR NEGATIVE 05/22/2016 1735   UROBILINOGEN 0.2 12/16/2012 1352   NITRITE NEGATIVE 05/22/2016 1735   LEUKOCYTESUR NEGATIVE 05/22/2016 1735   Sepsis Labs Invalid input(s): PROCALCITONIN,  WBC,  LACTICIDVEN Microbiology Recent Results (from the past 240 hour(s))  Resp Panel by RT-PCR (Flu A&B, Covid) Nasopharyngeal Swab     Status: None   Collection Time: 07/13/21  2:03 PM   Specimen: Nasopharyngeal Swab;  Nasopharyngeal(NP) swabs in vial transport medium  Result Value Ref Range Status   SARS Coronavirus 2 by RT PCR NEGATIVE NEGATIVE Final    Comment: (NOTE) SARS-CoV-2 target nucleic acids are NOT DETECTED.  The SARS-CoV-2 RNA is generally detectable in upper respiratory specimens during the acute phase of infection. The lowest concentration of SARS-CoV-2 viral copies this assay can detect is 138 copies/mL. A negative result does not preclude SARS-Cov-2 infection and should not be used as the sole basis for treatment or other patient management decisions. A negative result may occur with  improper specimen collection/handling, submission of specimen other than nasopharyngeal swab, presence of viral mutation(s) within the areas targeted by this assay, and inadequate number of viral copies(<138 copies/mL). A negative result must be combined with clinical observations, patient history, and epidemiological information. The expected result is Negative.  Fact Sheet for Patients:  EntrepreneurPulse.com.au  Fact Sheet for Healthcare Providers:  IncredibleEmployment.be  This test is no t yet approved or cleared by the [place] FDA and  has been authorized for detection and/or diagnosis of SARS-CoV-2 by FDA under an Emergency Use Authorization (EUA). This EUA will remain  in effect (meaning this test can be used) for the duration of the COVID-19 declaration under Section 564(b)(1) of the Act, 21 U.S.C.section 360bbb-3(b)(1), unless the authorization is terminated  or revoked sooner.       Influenza A by PCR NEGATIVE NEGATIVE Final   Influenza B by PCR NEGATIVE NEGATIVE Final    Comment: (NOTE) The Xpert Xpress SARS-CoV-2/FLU/RSV plus assay is intended as an aid in the diagnosis of influenza from Nasopharyngeal swab specimens and should not be used as a sole basis for treatment. Nasal washings and aspirates are unacceptable for Xpert Xpress  SARS-CoV-2/FLU/RSV testing.  Fact Sheet for Patients: EntrepreneurPulse.com.au  Fact Sheet for Healthcare Providers: IncredibleEmployment.be  This test is  not yet approved or cleared by the [place] and has been authorized for detection and/or diagnosis of SARS-CoV-2 by FDA under an Emergency Use Authorization (EUA). This EUA will remain in effect (meaning this test can be used) for the duration of the COVID-19 declaration under Section 564(b)(1) of the Act, 21 U.S.C. section 360bbb-3(b)(1), unless the authorization is terminated or revoked.  Performed at [facility], [address], [place], [place] [address]   Respiratory (~20 pathogens) panel by PCR     Status: None   Collection Time: 07/14/21  2:45 PM   Specimen: Nasopharyngeal Swab; Respiratory  Result Value Ref Range Status   Adenovirus NOT DETECTED NOT DETECTED Final   Coronavirus 229E NOT DETECTED NOT DETECTED Final    Comment: (NOTE) The Coronavirus on the Respiratory Panel, DOES NOT test for the novel  Coronavirus (2019 nCoV)    Coronavirus HKU1 NOT DETECTED NOT DETECTED Final   Coronavirus NL63 NOT DETECTED NOT DETECTED Final   Coronavirus OC43 NOT DETECTED NOT DETECTED Final   Metapneumovirus NOT DETECTED NOT DETECTED Final   Rhinovirus / Enterovirus NOT DETECTED NOT DETECTED Final   Influenza A NOT DETECTED NOT DETECTED Final   Influenza B NOT DETECTED NOT DETECTED Final   Parainfluenza Virus 1 NOT DETECTED NOT DETECTED Final   Parainfluenza Virus 2 NOT DETECTED NOT DETECTED Final   Parainfluenza Virus 3 NOT DETECTED NOT DETECTED Final   Parainfluenza Virus 4 NOT DETECTED NOT DETECTED Final   Respiratory Syncytial Virus NOT DETECTED NOT DETECTED Final   Bordetella pertussis NOT DETECTED NOT DETECTED Final   Bordetella Parapertussis NOT DETECTED NOT DETECTED Final   Chlamydophila pneumoniae NOT DETECTED NOT DETECTED Final   Mycoplasma pneumoniae  NOT DETECTED NOT DETECTED Final    Comment: Performed at [facility], [place]. [address] [place], [place] [address]     Time coordinating discharge: Over 30 minutes  SIGNED:   Delana Manganello J [place] DO  [facility] 07/16/2021, 11:30 AM

## 2021-07-16 NOTE — Evaluation (Signed)
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  He has hx including hypertension, obesity, sleep apnea, type 2 diabetes, CKD stage III  Clinical Impression  Pt admitted with above diagnosis.  He is independent at baseline and has support from family as needed.  PT has been mobilizing in room on his own.  He ambulated in room on RA with stable sats with RN earlier, but when distance increased pt desaturated to 85% with PT on RA.  O2 sats were stable on 2 L.  Pt demonstrating safe gait and transfers at near baseline leve - slight decrease in endurance.  Encouraged to monitor O2, gradually increase endurance, and perform several short walks per day (every 1-2) hours.  SATURATION QUALIFICATIONS: (This note is used to comply with regulatory documentation for home oxygen)  Patient Saturations on Room Air at Rest = 95%  Patient Saturations on Room Air while Ambulating = 85%  Patient Saturations on 2 Liters of oxygen while Ambulating = 94%  Please briefly explain why patient needs home oxygen: When ambulating household or community distances , pt's O2 dropping to 85% on RA.  Required 2 L to maintain safe O2 level.      Follow Up Recommendations No PT follow up    Equipment Recommendations  Other (comment) (home O2)    Recommendations for Other Services       Precautions / Restrictions Precautions Precautions: None      Mobility  Bed Mobility Overal bed mobility: Independent                  Transfers Overall transfer level: Independent               General transfer comment: Pt sitting at EOB upon arrival and has been ambulating in room.  Demonstrated 3 x sit to stand safely during PT  Ambulation/Gait Ambulation/Gait assistance:  Supervision Gait Distance (Feet): 200 Feet (200'x2) Assistive device: Rolling walker (2 wheeled) Gait Pattern/deviations: Step-through pattern;Trunk flexed Gait velocity: normal   General Gait Details: Pt ambulated 200' on RA with sats dropping to 85%.  THen ambulated 200' on 2 L with sats 94%. Cues for focus on breathing during activity  Stairs            Wheelchair Mobility    Modified Rankin (Stroke Patients Only)       Balance Overall balance assessment: Independent                                           Pertinent Vitals/Pain Pain Assessment: No/denies pain    Home Living Family/patient expects to be discharged to:: Private residence Living Arrangements: Spouse/significant other Available Help at Discharge: Family;Available 24 hours/day Type of Home: House Home Access: Stairs to enter Entrance Stairs-Rails: None Entrance Stairs-Number of Steps: 3 Home Layout: One level Home Equipment: Cane - single point;Walker - 2 wheels;Walker - 4 wheels      Prior Function Level of Independence: Needs assistance   Gait / Transfers Assistance Needed: Ambulates with cane when he goes out but no AD in home; can ambulate in community  ADL's / [place]: Independent with ADLs and IADLs  Comments: no  Falls     Hand Dominance        Extremity/Trunk Assessment   Upper Extremity Assessment Upper Extremity Assessment: Overall WFL for tasks assessed    Lower Extremity Assessment Lower Extremity Assessment: Overall WFL for tasks assessed    Cervical / Trunk Assessment Cervical / Trunk Assessment: Normal  Communication   Communication: No difficulties  Cognition Arousal/Alertness: Awake/alert Behavior During Therapy: WFL for tasks assessed/performed Overall Cognitive Status: Within Functional Limits for tasks assessed                                        General Comments General comments (skin integrity,  edema, etc.): Educated on gradual increase in endurance and O2 with activity.  Enocuraged to get pulse oximeter to check O2.  Also provided incentive spirometer with pt drawing in 1750 mL  with correct use of IS    Exercises     Assessment/Plan    PT Assessment Patent does not need any further PT services  PT Problem List         PT Treatment Interventions      PT Goals (Current goals can be found in the Care Plan section)  Acute Rehab PT Goals Patient Stated Goal: return hom PT Goal Formulation: All assessment and education complete, DC therapy    Frequency     Barriers to discharge        Co-evaluation               AM-PAC PT "6 Clicks" Mobility  Outcome Measure Help needed turning from your back to your side while in a flat bed without using bedrails?: None Help needed moving from lying on your back to sitting on the side of a flat bed without using bedrails?: None Help needed moving to and from a bed to a chair (including a wheelchair)?: None Help needed standing up from a chair using your arms (e.g., wheelchair or bedside chair)?: None Help needed to walk in hospital room?: None Help needed climbing 3-5 steps with a railing? : A Little 6 Click Score: 23    End of Session Equipment Utilized During Treatment: Oxygen Activity Tolerance: Patient tolerated treatment well Patient left: in bed;with call bell/phone within reach Nurse Communication: Mobility status      Time: [id number] PT Time Calculation (min) (ACUTE ONLY): 23 min   Charges:   PT Evaluation $PT Eval Low Complexity: 1 Low PT Treatments $Gait Training: 8-22 mins        Abran Richard, PT Acute Rehab Services Pager [phone] [facility] [phone]   Karlton Lemon 07/16/2021, 11:29 AM

## 2021-07-16 NOTE — TOC Transition Note (Signed)
Transition of Care [facility]) - CM/SW Discharge Note   Patient Details  Name: Bradley Alvarez MRN: [id number] Date of Birth: 1941-10-16  [facility] CM/SW Contact:  Trish Mage, LCSW Phone Number: 07/16/2021, 1:06 PM   Clinical Narrative:   Patient who is stable for d/c is in need of home O2.  Orders and SAT note seen and appreciated.  Contacted Zach with [facility] who will arrange for delivery of home unit, travel cannister.  TOC sign off.    Final next level of care: Home/Self Care Barriers to Discharge: No Barriers Identified   Patient Goals and CMS Choice        Discharge Placement                       Discharge Plan and Services                                     Social Determinants of Health (SDOH) Interventions     Readmission Risk Interventions No flowsheet data found.

## 2021-07-16 NOTE — Progress Notes (Signed)
Pt is discharge from the unit with the oxygen. Explained all the discharge instruction with wife. No concerns.

## 2021-07-23 LAB — ANTINUCLEAR ANTIBODIES, IFA: ANA Ab, IFA: NEGATIVE

## 2021-07-24 DIAGNOSIS — I130 Hypertensive heart and chronic kidney disease with heart failure and stage 1 through stage 4 chronic kidney disease, or unspecified chronic kidney disease: Secondary | ICD-10-CM | POA: Diagnosis not present

## 2021-07-24 DIAGNOSIS — I5042 Chronic combined systolic (congestive) and diastolic (congestive) heart failure: Secondary | ICD-10-CM | POA: Diagnosis not present

## 2021-07-24 DIAGNOSIS — G4733 Obstructive sleep apnea (adult) (pediatric): Secondary | ICD-10-CM | POA: Diagnosis not present

## 2021-07-24 DIAGNOSIS — I2699 Other pulmonary embolism without acute cor pulmonale: Secondary | ICD-10-CM | POA: Diagnosis not present

## 2021-07-24 DIAGNOSIS — E785 Hyperlipidemia, unspecified: Secondary | ICD-10-CM | POA: Diagnosis not present

## 2021-07-31 ENCOUNTER — Other Ambulatory Visit: Payer: Self-pay

## 2021-07-31 ENCOUNTER — Encounter: Payer: Self-pay | Admitting: Primary Care

## 2021-07-31 ENCOUNTER — Ambulatory Visit (INDEPENDENT_AMBULATORY_CARE_PROVIDER_SITE_OTHER): Payer: Medicare HMO | Admitting: Primary Care

## 2021-07-31 DIAGNOSIS — J9600 Acute respiratory failure, unspecified whether with hypoxia or hypercapnia: Secondary | ICD-10-CM | POA: Insufficient documentation

## 2021-07-31 DIAGNOSIS — G4733 Obstructive sleep apnea (adult) (pediatric): Secondary | ICD-10-CM | POA: Diagnosis not present

## 2021-07-31 DIAGNOSIS — J9611 Chronic respiratory failure with hypoxia: Secondary | ICD-10-CM | POA: Insufficient documentation

## 2021-07-31 DIAGNOSIS — J9601 Acute respiratory failure with hypoxia: Secondary | ICD-10-CM | POA: Diagnosis not present

## 2021-07-31 DIAGNOSIS — I2699 Other pulmonary embolism without acute cor pulmonale: Secondary | ICD-10-CM | POA: Diagnosis not present

## 2021-07-31 NOTE — Assessment & Plan Note (Addendum)
-   Continue CPAP, managed by Dr. Shelva Majestic. DME company is [employer]

## 2021-07-31 NOTE — Assessment & Plan Note (Addendum)
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 He was transitioned from heparin gtt to Eliquis. He is tolerating anticoagulation medication well. No chest discomfort, cough, hemoptysis or bleeding. Ambulatory O2 91% RA. Plan continue Eliquis '5mg'$  BID and prn supplemental oxygen. He will need minimum 3 months treatment but will likely need life long AC as PE appears to be unprovoked. His risk factors include morbid obesity and sedentary lifestyle. RV function was normal, no need for repeat imaging or TTE. FU in 3 months with Dr. Silas Flood.

## 2021-07-31 NOTE — Assessment & Plan Note (Signed)
07/31/2021 Walked on RA x 1 lap = approx 264f at slow paced and stopped d/t back discomfort. Desaturated to 91%.  - Recommend patient continue to wear 2L oxygen with moderate exertion and at bedtime. We will reassess at follow-up and likely discontinue.

## 2021-07-31 NOTE — Patient Instructions (Addendum)
Pulmonary embolism: - Continue Eliquis '5mg'$  twice daily  - Will likely need life long anticoagulation as blood clot was unprovoked and you have a higher risk for re-occurrence - You are a bleeding risk while on anticoagulation  - Continue to work on weight loss efforts, congratulations on getting below 300lbs  Respiratory failure: - Continue oxygen 2l to maintain O2 >88-90%  OSA: - Continue CPAP  Follow-up: - 3 months with Dr. Silas Flood     Apixaban Tablets What is this medication? APIXABAN (a PIX a ban) prevents or treats blood clots. It is also used to lower the risk of stroke in people with AFib (atrial fibrillation). It belongs to a group of medications called blood thinners. This medicine may be used for other purposes; ask your health care provider or pharmacist if you have questions. COMMON BRAND NAME(S): Eliquis What should I tell my care team before I take this medication? They need to know if you have any of these conditions: Antiphospholipid antibody syndrome Bleeding disorder History of bleeding in the brain History of blood clots History of stomach bleeding Kidney disease Liver disease Mechanical heart valve Spinal surgery An unusual or allergic reaction to apixaban, other medications, foods, dyes, or preservatives Pregnant or trying to get pregnant Breast-feeding How should I use this medication? Take this medication by mouth. For your therapy to work as well as possible, take each dose exactly as prescribed on the prescription label. Do not skip doses. Skipping doses or stopping this medication can increase your risk of a blood clot or stroke. Keep taking this medication unless your care team tells you to stop. Take it as directed on the prescription label at the same time every day. You can take it with or without food. If it upsets your stomach, take it with food. A special MedGuide will be given to you by the pharmacist with each prescription and refill. Be sure  to read this information carefully each time. Talk to your care team about the use of this medication in children. Special care may be needed. Overdosage: If you think you have taken too much of this medicine contact a poison control center or emergency room at once. NOTE: This medicine is only for you. Do not share this medicine with others. What if I miss a dose? If you miss a dose, take it as soon as you can. If it is almost time for your next dose, take only that dose. Do not take double or extra doses. What may interact with this medication? This medication may interact with the following: Aspirin and aspirin-like medications Certain medications for fungal infections like itraconazole and ketoconazole Certain medications for seizures like carbamazepine and phenytoin Certain medications for blood clots like enoxaparin, dalteparin, heparin, and warfarin Clarithromycin NSAIDs, medications for pain and inflammation, like ibuprofen or naproxen Rifampin Ritonavir St. Brandn's wort This list may not describe all possible interactions. Give your health care provider a list of all the medicines, herbs, non-prescription drugs, or dietary supplements you use. Also tell them if you smoke, drink alcohol, or use illegal drugs. Some items may interact with your medicine. What should I watch for while using this medication? Visit your healthcare professional for regular checks on your progress. You may need blood work done while you are taking this medication. Your condition will be monitored carefully while you are receiving this medication. It is important not to miss any appointments. Avoid sports and activities that might cause injury while you are using this medication. Severe  falls or injuries can cause unseen bleeding. Be careful when using sharp tools or knives. Consider using an Copy. Take special care brushing or flossing your teeth. Report any injuries, bruising, or red spots on the skin  to your healthcare professional. If you are going to need surgery or other procedure, tell your healthcare professional that you are taking this medication. Wear a medical ID bracelet or chain. Carry a card that describes your disease and details of your medication and dosage times. What side effects may I notice from receiving this medication? Side effects that you should report to your care team as soon as possible: Allergic reactions-skin rash, itching, hives, swelling of the face, lips, tongue, or throat Bleeding-bloody or black, tar-like stools, vomiting blood or brown material that looks like coffee grounds, red or dark brown urine, small red or purple spots on the skin, unusual bruising or bleeding Bleeding in the brain-severe headache, stiff neck, confusion, dizziness, change in vision, numbness or weakness of the face, arm, or leg, trouble speaking, trouble walking, vomiting Heavy periods This list may not describe all possible side effects. Call your doctor for medical advice about side effects. You may report side effects to FDA at 1-800-FDA-1088. Where should I keep my medication? Keep out of the reach of children and pets. Store at room temperature between 20 and 25 degrees C (68 and 77 degrees F). Get rid of any unused medication after the expiration date. To get rid of medications that are no longer needed or expired: Take the medication to a medication take-back program. Check with your pharmacy or law enforcement to find a location. If you cannot return the medication, check the label or package insert to see if the medication should be thrown out in the garbage or flushed down the toilet. If you are not sure, ask your care team. If it is safe to put in the trash, empty the medication out of the container. Mix the medication with cat litter, dirt, coffee grounds, or other unwanted substance. Seal the mixture in a bag or container. Put it in the trash. NOTE: This sheet is a summary.  It may not cover all possible information. If you have questions about this medicine, talk to your doctor, pharmacist, or health care provider.  2022 Elsevier/Gold Standard (2020-12-14 12:37:23)

## 2021-07-31 NOTE — Progress Notes (Signed)
$'@Patient'V$  ID: Bradley Alvarez, male    DOB: 03/22/1941, 80 y.o.   MRN: [id number]  Chief Complaint  Patient presents with   Follow-up    Patient reports that he is doing some better.     Referring provider: [name], MD  HPI: 80 year old male, former smoker quit 1973 (15-pack-year history).  Past medical history significant for obstructive sleep apnea, hypertension, pulmonary embolism, coronary artery disease, 1 diastolic dysfunction, thoracic aortic aneurysm, type 2 diabetes, hyperlipidemia, morbid obesity.  He was seen inpatient by [facility] team as consult for acute hypoxic respiratory failure, right lower lobe pulmonary infarct and pleurisy.  Pulmonary infarct suspected related to small/now resolved PE not seen on imaging (CTA or V/Q).  Duplex LE on 8/14 was negative for DVT.  Low suspicion for infection.  Prolactin negative. He was initially on heparin drip and was transitioned to Eliquis.  Discharged on 2 L of oxygen.  07/31/2021- interim hx  Patient presents today for hospital follow-up.  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 He was discharged on Eliquis '10mg'$  BID x 1 week and supplemental oxygen. He is feeling well today. He has had no issues with his breathing. He no longer has a cough. He is compliant with anticoagulation medication, currently on Eliquis '5mg'$  twice daily. Today he is on using 2L oxygen and he is wearing it at night with CPAP. He was able to shower today without his oxygen and reports O2 was 95% RA. Denies chest discomfort, cough, hemoptysis or bleeding.   Imaging:  CXR 07/15/21 showed no acute findings  CTA 07/13/21 no central obstructing PE. Acute wedge shaped consolidation abutting the pleural of the right lower lobe with surrounding ground glass opacity, suspicious for pulmonary infarction   No Known Allergies  Immunization History  Administered Date(s) Administered   Influenza  Split 09/02/2010, 08/21/2011, 08/23/2012, 08/19/2013, 08/21/2014   Influenza, Quadrivalent, Recombinant, Inj, Pf 08/24/2018, 07/22/2019, 09/25/2020   Influenza,inj,Quad PF,6+ Mos 08/18/2014, 08/09/2015   PFIZER(Purple Top)SARS-COV-2 Vaccination 12/24/2019, 01/13/2020, 09/02/2020, 05/13/2021   Pneumococcal Conjugate-13 09/19/2013   Tdap 07/13/2012   Zoster, Live 03/07/2014    Past Medical History:  Diagnosis Date   AAA (abdominal aortic aneurysm) without rupture ([place])    followed by dr Derek Jack--  per office note dated 11-05-2018  3.5cm   Benign essential HTN    cardiologist-- dr Margart Sickles   Chronic combined systolic (congestive) and diastolic (congestive) heart failure [facility])    cardiologist-- dr Robb Matar   CKD (chronic kidney disease), stage III ([place])    Diabetes mellitus type 2, insulin dependent ([place])    followed by dr Virgina Jock   Diabetic neuropathy ([place])    DOE (dyspnea on exertion)    Edema of both lower extremities    Full dentures    HOH (hard of hearing)    does wear hearing aids   Hydrocele, bilateral    Lichen planus    Mixed hyperlipidemia    Morbid obesity with BMI of 45.0-49.9, adult ([place])    Nocturia    OA (osteoarthritis)    OSA on CPAP 10/14/08   per study  AHI  61.8/hr ,  RDI  96.1/hr.     Tobacco History: Social History   Tobacco Use  Smoking Status Former   Packs/day: 1.50   Years: 10.00   Pack years: 15.00   Types: Cigarettes   Quit date: 12/14/1971   Years since quitting: 49.6  Smokeless Tobacco Former  Types: Snuff, Chew   Counseling given: Not Answered   Outpatient Medications Prior to Visit  Medication Sig Dispense Refill   apixaban (ELIQUIS) 5 MG TABS tablet Take 2 tablets ('10mg'$ ) twice daily for 7 days, then 1 tablet ('5mg'$ ) twice daily 60 tablet 0   aspirin EC 81 MG tablet Take 81 mg by mouth daily.     BIDIL 20-37.5 MG tablet TAKE 2 TABLETS BY MOUTH THREE TIMES A DAY 540 tablet 3   carvedilol (COREG) 25 MG tablet Take 2 tablets (50 mg  total) by mouth 2 (two) times daily with a meal. (Patient taking differently: Take 25 mg by mouth daily.) 180 tablet 3   clobetasol ointment (TEMOVATE) [id number] % Apply 1 application topically daily as needed (for itching).      Cyanocobalamin 2500 MCG TABS Take 500 mcg by mouth daily.      diclofenac sodium (VOLTAREN) 1 % GEL Apply 1 application topically 4 (four) times daily as needed (For pain.).     Dulaglutide 1.5 MG/0.5ML SOPN Inject 1 Dose into the skin once a week. Wednesday's     felodipine (PLENDIL) 5 MG 24 hr tablet Take 5 mg by mouth 2 (two) times daily.      fluticasone (CUTIVATE) 0.005 % ointment Apply 1 application topically 2 (two) times daily.     furosemide (LASIX) 20 MG tablet TAKE ONE TABLET BY MOUTH ONE TIME DAILY *TAKE 1 TAB TWICE DAILY FOR 3-5 DAYS IF INCREASED LEG SWELLING OR SHORTNESS OF BREATH* (Patient taking differently: 40 mg. Patient takes 40 mg daily.  Take additional 20 mg 3-5 days if swelling per patient.) 90 tablet 0   HYDROcodone-acetaminophen (NORCO/VICODIN) 5-325 MG tablet 2 (two) times daily.     loratadine (CLARITIN) 10 MG tablet Take 10 mg by mouth every morning.      losartan (COZAAR) 100 MG tablet Take 100 mg by mouth every evening.      MEGARED OMEGA-3 KRILL OIL PO Take 1 tablet by mouth daily.     metFORMIN (GLUCOPHAGE) 1000 MG tablet Take 1,000 mg by mouth 2 (two) times daily with a meal.     Multiple Vitamin (MULTIVITAMIN WITH MINERALS) TABS tablet Take 1 tablet by mouth daily.     ONETOUCH VERIO test strip USE TO TEST BID AS DIRECTED  2   Polyethyl Glycol-Propyl Glycol (SYSTANE OP) Apply to eye as needed.     potassium chloride SA (K-DUR) 20 MEQ tablet      simvastatin (ZOCOR) 40 MG tablet Take 40 mg by mouth every evening.     spironolactone (ALDACTONE) 50 MG tablet Take 1 tablet by mouth daily.     Turmeric 400 MG CAPS Take by mouth 2 (two) times daily.     urea (CARMOL) 10 % cream Apply topically as needed.     No facility-administered medications  prior to visit.      Review of Systems  Review of Systems   Physical Exam  BP 130/80 (BP Location: Left Arm, Patient Position: Sitting, Cuff Size: Large)   Pulse 65   Temp 98.1 F (36.7 C) (Oral)   Ht '6\' 2"'$  (1.88 m)   Wt 297 lb (134.7 kg)   SpO2 100%   BMI 38.13 kg/m  Physical Exam   Lab Results:  CBC    Component Value Date/Time   WBC 5.7 07/16/2021 0435   RBC 4.29 07/16/2021 0435   HGB 12.0 (L) 07/16/2021 0435   HGB 14.1 09/10/2017 1056   HCT 38.0 (L) 07/16/2021  0435   HCT 42.3 09/10/2017 1056   PLT 181 07/16/2021 0435   PLT 141 (L) 09/10/2017 1056   MCV 88.6 07/16/2021 0435   MCV 81 09/10/2017 1056   MCH 28.0 07/16/2021 0435   MCHC 31.6 07/16/2021 0435   RDW 14.3 07/16/2021 0435   RDW 15.1 09/10/2017 1056   LYMPHSABS 1.6 07/13/2021 1339   MONOABS 1.7 (H) 07/13/2021 1339   EOSABS 0.1 07/13/2021 1339   BASOSABS 0.0 07/13/2021 1339    BMET    Component Value Date/Time   NA 134 (L) 07/14/2021 0122   NA 140 11/09/2020 0806   K 3.5 07/14/2021 0122   CL 97 (L) 07/14/2021 0122   CO2 28 07/14/2021 0122   GLUCOSE 152 (H) 07/14/2021 0122   BUN 17 07/14/2021 0122   BUN 16 11/09/2020 0806   CREATININE 1.29 (H) 07/14/2021 0122   CREATININE 1.14 03/03/2017 1028   CALCIUM 8.4 (L) 07/14/2021 0122   GFRNONAA 56 (L) 07/14/2021 0122   GFRAA 57 (L) 11/09/2020 0806    BNP    Component Value Date/Time   BNP 15.9 07/13/2021 1339    ProBNP No results found for: PROBNP  Imaging: CT Angio Chest PE W and/or Wo Contrast  Result Date: 07/13/2021 CLINICAL DATA:  Shortness of breath and RIGHT flank pain for 3 days. PE suspected. EXAM: CT ANGIOGRAPHY CHEST WITH CONTRAST TECHNIQUE: Multidetector CT imaging of the chest was performed using the standard protocol during bolus administration of intravenous contrast. Multiplanar CT image reconstructions and MIPs were obtained to evaluate the vascular anatomy. CONTRAST:  2m OMNIPAQUE IOHEXOL 350 MG/ML SOLN COMPARISON:   Chest CT dated 01/15/2021. FINDINGS: Cardiovascular: Some of the most peripheral segmental and subsegmental pulmonary artery branches are difficult to characterize due to patient breathing motion artifact and suboptimal contrast opacification, however, there is no pulmonary embolism identified within the main, lobar or central segmental pulmonary arteries bilaterally. No thoracic aortic aneurysm or evidence of aortic dissection. Mild aortic atherosclerosis. Scattered coronary artery calcifications. No pericardial effusion. Mediastinum/Nodes: No mass or enlarged lymph nodes are seen within the mediastinum. Esophagus is unremarkable. Trachea and central bronchi are unremarkable. Lungs/Pleura: Patchy consolidations at each lung base, some component chronic, some components being new including a wedge-shaped consolidation abutting the pleura of the RIGHT lower lobe with surrounding gland glass opacity (series 9, image 98), suspicious for pulmonary infarct. Additional small new consolidations within the LEFT lower lobe. Upper Abdomen: No acute findings.  Cholelithiasis. Musculoskeletal: No acute-appearing osseous abnormality. Review of the MIP images confirms the above findings. IMPRESSION: 1. No central obstructing pulmonary embolism identified. 2. However, there is an acute wedge-shaped consolidation abutting the pleura of the RIGHT lower lobe with surrounding groundglass opacity, suspicious for pulmonary infarct. No pulmonary embolism identified, but this pulmonary finding is suspicious for occult PE within a peripheral subsegmental branch to the RIGHT lower lobe. 3. Additional patchy consolidations at the bilateral lung bases, atelectasis versus multifocal pneumonia. 4. Cholelithiasis without evidence of acute cholecystitis. Aortic Atherosclerosis (ICD10-I70.0). Electronically Signed   By: SFranki CabotM.D.   On: 07/13/2021 16:39   NM Pulmonary Perfusion  Result Date: 07/15/2021 CLINICAL DATA:  Shortness of  breath, chest pain, hemoptysis EXAM: NUCLEAR MEDICINE PERFUSION LUNG SCAN TECHNIQUE: Perfusion images were obtained in multiple projections after intravenous injection of radiopharmaceutical. Ventilation scans intentionally deferred if perfusion scan and chest x-ray adequate for interpretation during COVID 19 epidemic. RADIOPHARMACEUTICALS:  4.4 mCi Tc-923mAA IV COMPARISON:  12/23/2015 Correlation: Chest radiograph chest radiograph 07/15/2021 FINDINGS:  Elevation of RIGHT diaphragm, chronic. No perfusion defects identified. Reversal of anteroposterior perfusion gradient, raising question of pulmonary arterial hypertension. IMPRESSION: No evidence of pulmonary embolism. Chronic elevation RIGHT diaphragm. Reversal of anteroposterior perfusion gradient question pulmonary arterial hypertension. Electronically Signed   By: Lavonia Dana M.D.   On: 07/15/2021 15:12   DG CHEST PORT 1 VIEW  Result Date: 07/15/2021 CLINICAL DATA:  Right-sided chest pain. EXAM: PORTABLE CHEST 1 VIEW COMPARISON:  02/10/2021 and CT chest 07/13/2021. FINDINGS: Trachea is midline. Heart is enlarged, as before. Lungs are somewhat low in volume with an elevated right hemidiaphragm, as before. No airspace consolidation or pleural fluid. IMPRESSION: No acute findings. Electronically Signed   By: Lorin Picket M.D.   On: 07/15/2021 11:12   DG Chest Port 1 View  Result Date: 07/13/2021 CLINICAL DATA:  Pleuritic chest pain.  Hemoptysis. EXAM: PORTABLE CHEST 1 VIEW COMPARISON:  May 22, 2016 FINDINGS: The cardiomediastinal silhouette is stable. Elevation of the right hemidiaphragm is stable. No pneumothorax. No nodules or masses. No focal infiltrates. Mild atelectasis in the left base. No other acute abnormalities. IMPRESSION: No active disease. Electronically Signed   By: Dorise Bullion III M.D.   On: 07/13/2021 14:47   ECHOCARDIOGRAM COMPLETE  Result Date: 07/14/2021    ECHOCARDIOGRAM REPORT   Patient Name:   JAYTHON STOFFLET Date of Exam:  07/14/2021 Medical Rec #:  [id number]      Height:       74.0 in Accession #:    [id number]     Weight:       299.6 lb Date of Birth:  [birth year]       BSA:          2.581 m Patient Age:    13 years       BP:           131/85 mmHg Patient Gender: M              HR:           72 bpm. Exam Location:  Inpatient Procedure: 2D Echo, Cardiac Doppler, Color Doppler and Intracardiac            Opacification Agent Indications:     Pulmonary Embolus I26.09  History:         Patient has prior history of Echocardiogram examinations, most                  recent 09/21/2020. Risk Factors:Diabetes, Hypertension,                  Dyslipidemia and Sleep Apnea. AAA. Chronic kidney disease.  Sonographer:     Darlina Sicilian RDCS Referring Phys:  [id number] Rise Patience Diagnosing Phys: Vernell Leep MD IMPRESSIONS  1. Left ventricular ejection fraction, by estimation, is 50 to 55%. The left ventricle has low normal function. The left ventricle has no regional wall motion abnormalities. Left ventricular diastolic parameters are consistent with Grade I diastolic dysfunction (impaired relaxation).  2. Right ventricular systolic function is normal. The right ventricular size is mildly enlarged.  3. The mitral valve is grossly normal. No evidence of mitral valve regurgitation.  4. Tricuspid valve regurgitation is mild to moderate. IVC not well visualized to estimate PASP.  5. The aortic valve is tricuspid. Aortic valve regurgitation is not visualized.  6. Aortic dilatation noted. Aneurysm of the ascending aorta, measuring 43 mm.  7. Compared to previous outpatient study in 2021, AI, MR not well appreciated. TR appears  new. FINDINGS  Left Ventricle: Left ventricular ejection fraction, by estimation, is 50 to 55%. The left ventricle has low normal function. The left ventricle has no regional wall motion abnormalities. Definity contrast agent was given IV to delineate the left ventricular endocardial borders. The left ventricular internal  cavity size was normal in size. There is no left ventricular hypertrophy. Left ventricular diastolic parameters are consistent with Grade I diastolic dysfunction (impaired relaxation). Right Ventricle: The right ventricular size is mildly enlarged. No increase in right ventricular wall thickness. Right ventricular systolic function is normal. Left Atrium: Left atrial size was normal in size. Right Atrium: Right atrial size was normal in size. Pericardium: There is no evidence of pericardial effusion. Mitral Valve: The mitral valve is grossly normal. No evidence of mitral valve regurgitation. Tricuspid Valve: The tricuspid valve is grossly normal. Tricuspid valve regurgitation is mild to moderate. Aortic Valve: The aortic valve is tricuspid. Aortic valve regurgitation is not visualized. Pulmonic Valve: The pulmonic valve was grossly normal. Pulmonic valve regurgitation is not visualized. Aorta: Aortic dilatation noted. There is an aneurysm involving the ascending aorta measuring 43 mm. Venous: The inferior vena cava was not well visualized. IAS/Shunts: No atrial level shunt detected by color flow Doppler.  LEFT VENTRICLE PLAX 2D LVIDd:         5.90 cm      Diastology LVIDs:         4.50 cm      LV e' medial:    5.85 cm/s LV PW:         1.50 cm      LV E/e' medial:  9.9 LV IVS:        1.50 cm      LV e' lateral:   6.24 cm/s LVOT diam:     2.50 cm      LV E/e' lateral: 9.3 LV SV:         107 LV SV Index:   42 LVOT Area:     4.91 cm  LV Volumes (MOD) LV vol d, MOD A4C: 134.0 ml LV SV MOD A4C:     134.0 ml RIGHT VENTRICLE RV S prime:     9.92 cm/s TAPSE (M-mode): 2.5 cm LEFT ATRIUM             Index       RIGHT ATRIUM           Index LA diam:        4.50 cm 1.74 cm/m  RA Area:     11.70 cm LA Vol (A2C):   31.9 ml 12.36 ml/m RA Volume:   17.60 ml  6.82 ml/m LA Vol (A4C):   58.2 ml 22.55 ml/m LA Biplane Vol: 45.1 ml 17.48 ml/m  AORTIC VALVE LVOT Vmax:   115.00 cm/s LVOT Vmean:  72.250 cm/s LVOT VTI:    0.218 m   AORTA Ao Root diam: 4.50 cm Ao Asc diam:  4.30 cm MITRAL VALVE MV Area (PHT): 2.90 cm    SHUNTS MV Decel Time: 261 msec    Systemic VTI:  0.22 m MV E velocity: 58.03 cm/s  Systemic Diam: 2.50 cm MV A velocity: 89.97 cm/s MV E/A ratio:  0.65 Manish Patwardhan MD Electronically signed by Vernell Leep MD Signature Date/Time: 07/14/2021/3:12:03 PM    Final    VAS [place] LOWER EXTREMITY VENOUS (DVT)  Result Date: 07/14/2021  Lower Venous DVT Study Patient Name:  YECHIEL MYKLEBUST  Date of Exam:   07/14/2021 Medical Rec #:  [id number]       Accession #:    [id number] Date of Birth: July 27, 1941        Patient Gender: M Patient Age:   65 years Exam Location:  [facility] Procedure:      VAS [place] LOWER EXTREMITY VENOUS (DVT) Referring Phys: Gean Birchwood --------------------------------------------------------------------------------  Indications: Pulmonary embolism.  Limitations: Poor ultrasound/tissue interface and body habitus. Comparison Study: no prior Performing Technologist: Archie Patten RVS  Examination Guidelines: A complete evaluation includes B-mode imaging, spectral Doppler, color Doppler, and power Doppler as needed of all accessible portions of each vessel. Bilateral testing is considered an integral part of a complete examination. Limited examinations for reoccurring indications may be performed as noted. The reflux portion of the exam is performed with the patient in reverse Trendelenburg.  +---------+---------------+---------+-----------+----------+-------------------+ RIGHT    CompressibilityPhasicitySpontaneityPropertiesThrombus Aging      +---------+---------------+---------+-----------+----------+-------------------+ CFV      Full           Yes      Yes                                      +---------+---------------+---------+-----------+----------+-------------------+ SFJ      Full                                                              +---------+---------------+---------+-----------+----------+-------------------+ FV Prox  Full                                                             +---------+---------------+---------+-----------+----------+-------------------+ FV Mid   Full                                                             +---------+---------------+---------+-----------+----------+-------------------+ FV Distal               Yes      Yes                                      +---------+---------------+---------+-----------+----------+-------------------+ PFV      Full                                                             +---------+---------------+---------+-----------+----------+-------------------+ POP      Full           Yes      Yes                                      +---------+---------------+---------+-----------+----------+-------------------+  PTV      Full                                                             +---------+---------------+---------+-----------+----------+-------------------+ PERO                                                  Not well visualized +---------+---------------+---------+-----------+----------+-------------------+   +---------+---------------+---------+-----------+----------+--------------+ LEFT     CompressibilityPhasicitySpontaneityPropertiesThrombus Aging +---------+---------------+---------+-----------+----------+--------------+ CFV      Full           Yes      Yes                                 +---------+---------------+---------+-----------+----------+--------------+ SFJ      Full                                                        +---------+---------------+---------+-----------+----------+--------------+ FV Prox  Full                                                        +---------+---------------+---------+-----------+----------+--------------+ FV Mid   Full                                                         +---------+---------------+---------+-----------+----------+--------------+ FV DistalFull                                                        +---------+---------------+---------+-----------+----------+--------------+ PFV      Full                                                        +---------+---------------+---------+-----------+----------+--------------+ POP      Full           Yes      Yes                                 +---------+---------------+---------+-----------+----------+--------------+ PTV      Full                                                        +---------+---------------+---------+-----------+----------+--------------+  PERO     Full                                                        +---------+---------------+---------+-----------+----------+--------------+     Summary: BILATERAL: - No evidence of deep vein thrombosis seen in the lower extremities, bilaterally. -No evidence of popliteal cyst, bilaterally.   *See table(s) above for measurements and observations. Electronically signed by Harold Barban MD on 07/14/2021 at 3:29:02 PM.    Final      Assessment & Plan:   Pulmonary embolism [facility]) - Patient was admitted in August 2022 for RLL pulmonary infarct secondary to pulmonary embolism. He was transitioned from heparin gtt to Eliquis. He is tolerating anticoagulation medication well. No chest discomfort, cough, hemoptysis or bleeding. Ambulatory O2 91% RA. Plan continue Eliquis '5mg'$  BID and prn supplemental oxygen. He will need minimum 3 months treatment but will likely need life long AC as PE appears to be unprovoked. His risk factors include morbid obesity and sedentary lifestyle. RV function was normal, no need for repeat imaging or TTE. FU in 3 months with Dr. Silas Flood.   OSA (obstructive sleep apnea) - Continue CPAP, managed by Dr. Shelva Majestic. DME company is [employer]  Acute respiratory failure ([place]) 07/31/2021  Walked on RA x 1 lap = approx 267f at slow paced and stopped d/t back discomfort. Desaturated to 91%.  - Recommend patient continue to wear 2L oxygen with moderate exertion and at bedtime. We will reassess at follow-up and likely discontinue.    EMartyn Ehrich NP 07/31/2021

## 2021-08-07 DIAGNOSIS — G4733 Obstructive sleep apnea (adult) (pediatric): Secondary | ICD-10-CM | POA: Diagnosis not present

## 2021-08-14 ENCOUNTER — Other Ambulatory Visit: Payer: Self-pay | Admitting: Cardiology

## 2021-08-14 DIAGNOSIS — I10 Essential (primary) hypertension: Secondary | ICD-10-CM

## 2021-08-14 DIAGNOSIS — I712 Thoracic aortic aneurysm, without rupture, unspecified: Secondary | ICD-10-CM

## 2021-08-15 ENCOUNTER — Institutional Professional Consult (permissible substitution): Payer: Medicare HMO | Admitting: Pulmonary Disease

## 2021-08-16 DIAGNOSIS — I2699 Other pulmonary embolism without acute cor pulmonale: Secondary | ICD-10-CM | POA: Diagnosis not present

## 2021-08-16 DIAGNOSIS — G4733 Obstructive sleep apnea (adult) (pediatric): Secondary | ICD-10-CM | POA: Diagnosis not present

## 2021-08-19 ENCOUNTER — Ambulatory Visit: Payer: Medicare HMO | Admitting: Podiatry

## 2021-08-19 ENCOUNTER — Encounter: Payer: Self-pay | Admitting: Podiatry

## 2021-08-19 ENCOUNTER — Other Ambulatory Visit: Payer: Self-pay

## 2021-08-19 DIAGNOSIS — B351 Tinea unguium: Secondary | ICD-10-CM | POA: Diagnosis not present

## 2021-08-19 DIAGNOSIS — D689 Coagulation defect, unspecified: Secondary | ICD-10-CM

## 2021-08-19 DIAGNOSIS — M2140 Flat foot [pes planus] (acquired), unspecified foot: Secondary | ICD-10-CM

## 2021-08-19 DIAGNOSIS — E119 Type 2 diabetes mellitus without complications: Secondary | ICD-10-CM | POA: Diagnosis not present

## 2021-08-19 DIAGNOSIS — M79675 Pain in left toe(s): Secondary | ICD-10-CM

## 2021-08-19 DIAGNOSIS — M79674 Pain in right toe(s): Secondary | ICD-10-CM | POA: Diagnosis not present

## 2021-08-19 NOTE — Progress Notes (Signed)
This patient returns to my office for at risk foot care.  This patient requires this care by a professional since this patient will be at risk due to having  Diabetes.  This patient is unable to cut nails himself since the patient cannot reach his nails.These nails are painful walking and wearing shoes.  This patient presents for at risk foot care today.  General Appearance  Alert, conversant and in no acute stress.  Vascular  Dorsalis pedis  are  weakly  palpable  bilaterally.  Posterior tibial pulses are absent due to ankle swelling.  Absent pedal hair.Capillary return is within normal limits  bilaterally. Cold feet bilaterally.  Neurologic  Senn-Weinstein monofilament wire test within normal limits  bilaterally. Muscle power within normal limits bilaterally.  Nails Thick disfigured discolored nails with subungual debris  from hallux to fifth toes bilaterally. No evidence of bacterial infection or drainage bilaterally.  Orthopedic  No limitations of motion  feet .  No crepitus or effusions noted.  No bony pathology or digital deformities noted. HAV  B/L.  PTT tendinitis/arthritis left foot.  Pes planus  Skin  normotropic skin with no porokeratosis noted bilaterally.  No signs of infections or ulcers noted.     Onychomycosis  Pain in right toes  Pain in left toes  Consent was obtained for treatment procedures.   Mechanical debridement of nails 1-5  bilaterally performed with a nail nipper.  Filed with dremel without incident.    Return office visit   3 months                   Told patient to return for periodic foot care and evaluation due to potential at risk complications.   Gardiner Barefoot DPM

## 2021-09-04 ENCOUNTER — Other Ambulatory Visit: Payer: Self-pay | Admitting: Cardiology

## 2021-09-05 NOTE — Telephone Encounter (Signed)
Can I send this?

## 2021-09-06 DIAGNOSIS — G4733 Obstructive sleep apnea (adult) (pediatric): Secondary | ICD-10-CM | POA: Diagnosis not present

## 2021-09-09 ENCOUNTER — Other Ambulatory Visit: Payer: Self-pay | Admitting: Cardiology

## 2021-09-09 DIAGNOSIS — D696 Thrombocytopenia, unspecified: Secondary | ICD-10-CM | POA: Diagnosis not present

## 2021-09-09 DIAGNOSIS — Z23 Encounter for immunization: Secondary | ICD-10-CM | POA: Diagnosis not present

## 2021-09-09 DIAGNOSIS — I5042 Chronic combined systolic (congestive) and diastolic (congestive) heart failure: Secondary | ICD-10-CM | POA: Diagnosis not present

## 2021-09-09 DIAGNOSIS — G4733 Obstructive sleep apnea (adult) (pediatric): Secondary | ICD-10-CM | POA: Diagnosis not present

## 2021-09-09 DIAGNOSIS — I130 Hypertensive heart and chronic kidney disease with heart failure and stage 1 through stage 4 chronic kidney disease, or unspecified chronic kidney disease: Secondary | ICD-10-CM | POA: Diagnosis not present

## 2021-09-09 DIAGNOSIS — E1129 Type 2 diabetes mellitus with other diabetic kidney complication: Secondary | ICD-10-CM | POA: Diagnosis not present

## 2021-09-09 DIAGNOSIS — R809 Proteinuria, unspecified: Secondary | ICD-10-CM | POA: Diagnosis not present

## 2021-09-09 DIAGNOSIS — E785 Hyperlipidemia, unspecified: Secondary | ICD-10-CM | POA: Diagnosis not present

## 2021-09-09 DIAGNOSIS — N1831 Chronic kidney disease, stage 3a: Secondary | ICD-10-CM | POA: Diagnosis not present

## 2021-09-09 DIAGNOSIS — I2699 Other pulmonary embolism without acute cor pulmonale: Secondary | ICD-10-CM | POA: Diagnosis not present

## 2021-09-11 ENCOUNTER — Other Ambulatory Visit: Payer: Self-pay

## 2021-09-11 MED ORDER — BIDIL 20-37.5 MG PO TABS: 2.0000 | ORAL_TABLET | Freq: Three times a day (TID) | ORAL | 3 refills | Status: DC

## 2021-09-15 DIAGNOSIS — I2699 Other pulmonary embolism without acute cor pulmonale: Secondary | ICD-10-CM | POA: Diagnosis not present

## 2021-09-15 DIAGNOSIS — G4733 Obstructive sleep apnea (adult) (pediatric): Secondary | ICD-10-CM | POA: Diagnosis not present

## 2021-10-03 ENCOUNTER — Other Ambulatory Visit: Payer: Self-pay

## 2021-10-03 ENCOUNTER — Ambulatory Visit: Payer: Medicare HMO | Admitting: Pulmonary Disease

## 2021-10-03 ENCOUNTER — Encounter: Payer: Self-pay | Admitting: Pulmonary Disease

## 2021-10-03 VITALS — BP 126/84 | HR 61 | Ht 74.0 in | Wt 300.8 lb

## 2021-10-03 DIAGNOSIS — J9601 Acute respiratory failure with hypoxia: Secondary | ICD-10-CM | POA: Diagnosis not present

## 2021-10-03 DIAGNOSIS — G4733 Obstructive sleep apnea (adult) (pediatric): Secondary | ICD-10-CM

## 2021-10-03 NOTE — Progress Notes (Signed)
@Patient  ID: Bradley Alvarez, male    DOB: [birth year], 80 y.o.   MRN: [id number]  Chief Complaint  Patient presents with   Follow-up    3 mo f/u for PE. He states he has been doing well since last visit.     Referring provider: [name], MD  HPI: 80 year old male, former smoker quit 1973 (15-pack-year history).  Past medical history significant for obstructive sleep apnea, hypertension, pulmonary embolism, coronary artery disease, 1 diastolic dysfunction, thoracic aortic aneurysm, type 2 diabetes, hyperlipidemia, morbid obesity. D/c summary   Seen 07/2021 in hospital for presumed acute PE given wedge shaped infarct. Subsequent VQ negative. No evidence of infection Discharged on eliquis. Doing ok. Seen 08/2021 by Derl Barrow. Continuing eliquis  Imaging:  CXR 07/15/21 showed no acute findings  CTA 07/13/21 no central obstructing PE. Acute wedge shaped consolidation abutting the pleural of the right lower lobe with surrounding ground glass opacity, suspicious for pulmonary infarction   No Known Allergies  Immunization History  Administered Date(s) Administered   Influenza Split 09/02/2010, 08/21/2011, 08/23/2012, 08/19/2013, 08/21/2014   Influenza, Quadrivalent, Recombinant, Inj, Pf 08/24/2018, 07/22/2019, 09/25/2020   Influenza,inj,Quad PF,6+ Mos 08/18/2014, 08/09/2015   PFIZER(Purple Top)SARS-COV-2 Vaccination 12/24/2019, 01/13/2020, 09/02/2020, 05/13/2021   Pneumococcal Conjugate-13 09/19/2013   Tdap 07/13/2012   Zoster, Live 03/07/2014    Past Medical History:  Diagnosis Date   AAA (abdominal aortic aneurysm) without rupture    followed by dr Derek Jack--  per office note dated 11-05-2018  3.5cm   Benign essential HTN    cardiologist-- dr Margart Sickles   Chronic combined systolic (congestive) and diastolic (congestive) heart failure [facility])    cardiologist-- dr Robb Matar   CKD (chronic kidney disease), stage III ([place])    Diabetes mellitus type 2, insulin dependent ([place])     followed by dr Virgina Jock   Diabetic neuropathy ([place])    DOE (dyspnea on exertion)    Edema of both lower extremities    Full dentures    HOH (hard of hearing)    does wear hearing aids   Hydrocele, bilateral    Lichen planus    Mixed hyperlipidemia    Morbid obesity with BMI of 45.0-49.9, adult ([place])    Nocturia    OA (osteoarthritis)    OSA on CPAP 10/14/08   per study  AHI  61.8/hr ,  RDI  96.1/hr.     Tobacco History: Social History   Tobacco Use  Smoking Status Former   Packs/day: 1.50   Years: 10.00   Pack years: 15.00   Types: Cigarettes   Quit date: 12/14/1971   Years since quitting: 49.8  Smokeless Tobacco Former   Types: Snuff, Chew   Counseling given: Not Answered   Outpatient Medications Prior to Visit  Medication Sig Dispense Refill   apixaban (ELIQUIS) 5 MG TABS tablet Take 2 tablets (10mg ) twice daily for 7 days, then 1 tablet (5mg ) twice daily 60 tablet 0   aspirin EC 81 MG tablet Take 81 mg by mouth daily.     BIDIL 20-37.5 MG tablet Take 2 tablets by mouth 3 (three) times daily. 540 tablet 3   carvedilol (COREG) 25 MG tablet TAKE TWO TABLETS BY MOUTH TWICE DAILY  with meals 180 tablet 0   clobetasol ointment (TEMOVATE) 5.17 % Apply 1 application topically daily as needed (for itching).      Cyanocobalamin 2500 MCG TABS Take 500 mcg by mouth daily.      diclofenac sodium (VOLTAREN) 1 % GEL Apply 1  application topically 4 (four) times daily as needed (For pain.).     Dulaglutide 1.5 MG/0.5ML SOPN Inject 1 Dose into the skin once a week. Wednesday's     felodipine (PLENDIL) 5 MG 24 hr tablet TAKE TWO TABLETS BY MOUTH DAILY 180 tablet 0   furosemide (LASIX) 20 MG tablet TAKE ONE TABLET BY MOUTH ONE TIME DAILY *TAKE 1 TAB TWICE DAILY FOR 3-5 DAYS IF INCREASED LEG SWELLING OR SHORTNESS OF BREATH* (Patient taking differently: 40 mg. Patient takes 40 mg daily.  Take additional 20 mg 3-5 days if swelling per patient.) 90 tablet 0   HYDROcodone-acetaminophen  (NORCO/VICODIN) 5-325 MG tablet 2 (two) times daily.     loratadine (CLARITIN) 10 MG tablet Take 10 mg by mouth every morning.      losartan (COZAAR) 100 MG tablet Take 100 mg by mouth every evening.      MEGARED OMEGA-3 KRILL OIL PO Take 1 tablet by mouth daily.     metFORMIN (GLUCOPHAGE) 1000 MG tablet Take 1,000 mg by mouth 2 (two) times daily with a meal.     Multiple Vitamin (MULTIVITAMIN WITH MINERALS) TABS tablet Take 1 tablet by mouth daily.     ONETOUCH VERIO test strip USE TO TEST BID AS DIRECTED  2   Polyethyl Glycol-Propyl Glycol (SYSTANE OP) Apply to eye as needed.     potassium chloride SA (K-DUR) 20 MEQ tablet      simvastatin (ZOCOR) 40 MG tablet Take 40 mg by mouth every evening.     spironolactone (ALDACTONE) 50 MG tablet Take 1 tablet by mouth daily.     Turmeric 400 MG CAPS Take by mouth daily.     urea (CARMOL) 10 % cream Apply topically as needed.     fluticasone (CUTIVATE) 0.005 % ointment Apply 1 application topically 2 (two) times daily.     No facility-administered medications prior to visit.      Review of Systems  Review of Systems N/a  Physical Exam  BP 126/84   Pulse 61   Ht 6\' 2"  (1.88 m)   Wt (!) 300 lb 12.8 oz (136.4 kg)   SpO2 97% Comment: on RA  BMI 38.62 kg/m  Physical Exam  Neuro: Well-appearing, no acute distress Eyes: EOMI, no icterus Neck: Supple, no JVP appreciated Pulmonary: Clear, normal work of breathing Cardiovascular: Regular rate and rhythm, no murmurs   Lab Results: Personally reviewed CBC    Component Value Date/Time   WBC 5.7 07/16/2021 0435   RBC 4.29 07/16/2021 0435   HGB 12.0 (L) 07/16/2021 0435   HGB 14.1 09/10/2017 1056   HCT 38.0 (L) 07/16/2021 0435   HCT 42.3 09/10/2017 1056   PLT 181 07/16/2021 0435   PLT 141 (L) 09/10/2017 1056   MCV 88.6 07/16/2021 0435   MCV 81 09/10/2017 1056   MCH 28.0 07/16/2021 0435   MCHC 31.6 07/16/2021 0435   RDW 14.3 07/16/2021 0435   RDW 15.1 09/10/2017 1056   LYMPHSABS  1.6 07/13/2021 1339   MONOABS 1.7 (H) 07/13/2021 1339   EOSABS 0.1 07/13/2021 1339   BASOSABS 0.0 07/13/2021 1339    BMET    Component Value Date/Time   NA 134 (L) 07/14/2021 0122   NA 140 11/09/2020 0806   K 3.5 07/14/2021 0122   CL 97 (L) 07/14/2021 0122   CO2 28 07/14/2021 0122   GLUCOSE 152 (H) 07/14/2021 0122   BUN 17 07/14/2021 0122   BUN 16 11/09/2020 0806   CREATININE 1.29 (H) 07/14/2021  0122   CREATININE 1.14 03/03/2017 1028   CALCIUM 8.4 (L) 07/14/2021 0122   GFRNONAA 56 (L) 07/14/2021 0122   GFRAA 57 (L) 11/09/2020 0806    BNP    Component Value Date/Time   BNP 15.9 07/13/2021 1339    ProBNP No results found for: PROBNP  Imaging: No results found.   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  No evidence of active time.  Presumed related to blood clot.  Discussed risk and benefits of ongoing anticoagulation.  Given unprovoked nature would recommend lifelong which she understands.  OSA on CPAP: continue CPAP, oxygen bleeding.  Can assess need for repeat sleep studies in the future as needed.  Lanier Clam, MD 10/03/2021

## 2021-10-03 NOTE — Patient Instructions (Signed)
Nice to see you again  Continue taking Eliquis as prescribed.  Let me know if you need refills.  If there are concerns with oxygen during the day or night, please contact us and we can do additional work-up.  I suspect the oxygen will improve with movement, exercise, I worried the lung sacs are getting sticky and not allowing oxygen in.  I will improved with deep breaths and movement.  Plug the oxygen at night to the CPAP machine.  We can recheck oxygen settings at night if low oxygen becomes a concern.  Return to clinic in 3 months or sooner as needed with Dr. Silas Flood

## 2021-10-07 DIAGNOSIS — G4733 Obstructive sleep apnea (adult) (pediatric): Secondary | ICD-10-CM | POA: Diagnosis not present

## 2021-10-16 DIAGNOSIS — G4733 Obstructive sleep apnea (adult) (pediatric): Secondary | ICD-10-CM | POA: Diagnosis not present

## 2021-10-16 DIAGNOSIS — I2699 Other pulmonary embolism without acute cor pulmonale: Secondary | ICD-10-CM | POA: Diagnosis not present

## 2021-10-28 DIAGNOSIS — Z1152 Encounter for screening for COVID-19: Secondary | ICD-10-CM | POA: Diagnosis not present

## 2021-10-28 DIAGNOSIS — J101 Influenza due to other identified influenza virus with other respiratory manifestations: Secondary | ICD-10-CM | POA: Diagnosis not present

## 2021-10-28 DIAGNOSIS — U071 COVID-19: Secondary | ICD-10-CM | POA: Diagnosis not present

## 2021-10-28 DIAGNOSIS — E1129 Type 2 diabetes mellitus with other diabetic kidney complication: Secondary | ICD-10-CM | POA: Diagnosis not present

## 2021-10-28 DIAGNOSIS — R5383 Other fatigue: Secondary | ICD-10-CM | POA: Diagnosis not present

## 2021-10-28 DIAGNOSIS — R051 Acute cough: Secondary | ICD-10-CM | POA: Diagnosis not present

## 2021-10-28 DIAGNOSIS — R0981 Nasal congestion: Secondary | ICD-10-CM | POA: Diagnosis not present

## 2021-11-01 ENCOUNTER — Other Ambulatory Visit: Payer: Medicare HMO

## 2021-11-04 ENCOUNTER — Other Ambulatory Visit: Payer: Self-pay | Admitting: Cardiology

## 2021-11-04 DIAGNOSIS — I10 Essential (primary) hypertension: Secondary | ICD-10-CM

## 2021-11-04 DIAGNOSIS — I712 Thoracic aortic aneurysm, without rupture, unspecified: Secondary | ICD-10-CM

## 2021-11-11 ENCOUNTER — Other Ambulatory Visit: Payer: Self-pay

## 2021-11-11 ENCOUNTER — Encounter: Payer: Self-pay | Admitting: Podiatry

## 2021-11-11 ENCOUNTER — Ambulatory Visit: Payer: Medicare HMO | Admitting: Podiatry

## 2021-11-11 DIAGNOSIS — M2140 Flat foot [pes planus] (acquired), unspecified foot: Secondary | ICD-10-CM

## 2021-11-11 DIAGNOSIS — E119 Type 2 diabetes mellitus without complications: Secondary | ICD-10-CM | POA: Diagnosis not present

## 2021-11-11 DIAGNOSIS — M79675 Pain in left toe(s): Secondary | ICD-10-CM | POA: Diagnosis not present

## 2021-11-11 DIAGNOSIS — M79674 Pain in right toe(s): Secondary | ICD-10-CM | POA: Diagnosis not present

## 2021-11-11 DIAGNOSIS — B351 Tinea unguium: Secondary | ICD-10-CM | POA: Diagnosis not present

## 2021-11-11 DIAGNOSIS — N179 Acute kidney failure, unspecified: Secondary | ICD-10-CM | POA: Diagnosis not present

## 2021-11-11 DIAGNOSIS — D689 Coagulation defect, unspecified: Secondary | ICD-10-CM

## 2021-11-11 NOTE — Progress Notes (Signed)
This patient returns to my office for at risk foot care.  This patient requires this care by a professional since this patient will be at risk due to having  Diabetes.  This patient is unable to cut nails himself since the patient cannot reach his nails.These nails are painful walking and wearing shoes.  This patient presents for at risk foot care today.  General Appearance  Alert, conversant and in no acute stress.  Vascular  Dorsalis pedis  are  weakly  palpable  bilaterally.  Posterior tibial pulses are absent due to ankle swelling.  Absent pedal hair.Capillary return is within normal limits  bilaterally. Cold feet bilaterally.  Neurologic  Senn-Weinstein monofilament wire test within normal limits  bilaterally. Muscle power within normal limits bilaterally.  Nails Thick disfigured discolored nails with subungual debris  from hallux to fifth toes bilaterally. No evidence of bacterial infection or drainage bilaterally.  Orthopedic  No limitations of motion  feet .  No crepitus or effusions noted.  No bony pathology or digital deformities noted. HAV  B/L.  PTT tendinitis/arthritis left foot.  Pes planus  Skin  normotropic skin with no porokeratosis noted bilaterally.  No signs of infections or ulcers noted.     Onychomycosis  Pain in right toes  Pain in left toes  Consent was obtained for treatment procedures.   Mechanical debridement of nails 1-5  bilaterally performed with a nail nipper.  Filed with dremel without incident.    Return office visit   3 months                   Told patient to return for periodic foot care and evaluation due to potential at risk complications.   Gardiner Barefoot DPM

## 2021-11-13 ENCOUNTER — Ambulatory Visit: Payer: Medicare HMO | Admitting: Cardiology

## 2021-11-15 DIAGNOSIS — I2699 Other pulmonary embolism without acute cor pulmonale: Secondary | ICD-10-CM | POA: Diagnosis not present

## 2021-11-15 DIAGNOSIS — G4733 Obstructive sleep apnea (adult) (pediatric): Secondary | ICD-10-CM | POA: Diagnosis not present

## 2021-11-29 DIAGNOSIS — E1129 Type 2 diabetes mellitus with other diabetic kidney complication: Secondary | ICD-10-CM | POA: Diagnosis not present

## 2021-11-29 DIAGNOSIS — I130 Hypertensive heart and chronic kidney disease with heart failure and stage 1 through stage 4 chronic kidney disease, or unspecified chronic kidney disease: Secondary | ICD-10-CM | POA: Diagnosis not present

## 2021-11-29 DIAGNOSIS — N1831 Chronic kidney disease, stage 3a: Secondary | ICD-10-CM | POA: Diagnosis not present

## 2021-11-29 DIAGNOSIS — M1990 Unspecified osteoarthritis, unspecified site: Secondary | ICD-10-CM | POA: Diagnosis not present

## 2021-12-14 ENCOUNTER — Other Ambulatory Visit: Payer: Self-pay | Admitting: Cardiology

## 2021-12-16 DIAGNOSIS — I2699 Other pulmonary embolism without acute cor pulmonale: Secondary | ICD-10-CM | POA: Diagnosis not present

## 2021-12-16 DIAGNOSIS — G4733 Obstructive sleep apnea (adult) (pediatric): Secondary | ICD-10-CM | POA: Diagnosis not present

## 2021-12-21 ENCOUNTER — Other Ambulatory Visit: Payer: Self-pay | Admitting: Cardiology

## 2021-12-23 ENCOUNTER — Telehealth: Payer: Self-pay | Admitting: Pulmonary Disease

## 2021-12-23 NOTE — Telephone Encounter (Signed)
Called pt and spoke with spouse Shellia Carwin letting her know that we do not have samples of Eliquis and she verbalized understanding. Asked if pt had applied for pt assistance and she said that he had. Stated that there was an issue prior with the pt's signature but they have been able to get that taken care of and pt should hopefully be receiving Rx tomorrow 1/24 as this has been expedited due to pt being out of the med. Nothing further needed.

## 2021-12-29 DIAGNOSIS — M1990 Unspecified osteoarthritis, unspecified site: Secondary | ICD-10-CM | POA: Diagnosis not present

## 2021-12-29 DIAGNOSIS — N1831 Chronic kidney disease, stage 3a: Secondary | ICD-10-CM | POA: Diagnosis not present

## 2021-12-29 DIAGNOSIS — E1129 Type 2 diabetes mellitus with other diabetic kidney complication: Secondary | ICD-10-CM | POA: Diagnosis not present

## 2021-12-29 DIAGNOSIS — I130 Hypertensive heart and chronic kidney disease with heart failure and stage 1 through stage 4 chronic kidney disease, or unspecified chronic kidney disease: Secondary | ICD-10-CM | POA: Diagnosis not present

## 2022-01-01 ENCOUNTER — Other Ambulatory Visit: Payer: Self-pay

## 2022-01-01 ENCOUNTER — Encounter: Payer: Self-pay | Admitting: Pulmonary Disease

## 2022-01-01 ENCOUNTER — Ambulatory Visit: Payer: Medicare HMO | Admitting: Pulmonary Disease

## 2022-01-01 VITALS — BP 134/76 | HR 68 | Temp 98.0°F | Ht 74.0 in | Wt 308.0 lb

## 2022-01-01 DIAGNOSIS — I2699 Other pulmonary embolism without acute cor pulmonale: Secondary | ICD-10-CM | POA: Diagnosis not present

## 2022-01-01 DIAGNOSIS — G4733 Obstructive sleep apnea (adult) (pediatric): Secondary | ICD-10-CM | POA: Diagnosis not present

## 2022-01-01 NOTE — Patient Instructions (Signed)
No changes in medications  Let me know if still having issues getting Eliquis  We will send in new CPAP and oxygen supplies  Return to clinic in 6 months or sooner as needed

## 2022-01-01 NOTE — Progress Notes (Signed)
@Patient  ID: Bradley Alvarez, male    DOB: 1941-09-04, 81 y.o.   MRN: [id number]  Chief Complaint  Patient presents with   Follow-up    Follow up. Pt states that he is doing really well on his CPAP machine at night. Pt states that he wears it about 7-8 hours at night with no issues.     Referring provider: [name], MD  HPI: 81 year old male, former smoker quit 1973 (15-pack-year history).  Past medical history significant for obstructive sleep apnea, hypertension, pulmonary embolism, coronary artery disease, 1 diastolic dysfunction, thoracic aortic aneurysm, type 2 diabetes, hyperlipidemia, morbid obesity.   Doing fine, denies significant DOE. Ran out of Eliquis last week. Without for 6 days. Got samples via Cardiology office yesterday. Remains on O2. Using CPAP 7-8 hrs nightly per report. Awaiting new machine - waiting over 1 year. Needs new DME supplies.  Imaging:  CXR 07/15/21 showed no acute findings  CTA 07/13/21 no central obstructing PE. Acute wedge shaped consolidation abutting the pleural of the right lower lobe with surrounding ground glass opacity, suspicious for pulmonary infarction   No Known Allergies  Immunization History  Administered Date(s) Administered   Influenza Split 09/02/2010, 08/21/2011, 08/23/2012, 08/19/2013, 08/21/2014   Influenza, Quadrivalent, Recombinant, Inj, Pf 08/24/2018, 07/22/2019, 09/25/2020, 09/09/2021   Influenza,inj,Quad PF,6+ Mos 08/18/2014, 08/09/2015   PFIZER(Purple Top)SARS-COV-2 Vaccination 12/24/2019, 01/13/2020, 09/02/2020, 05/13/2021   Pneumococcal Conjugate-13 09/19/2013   Tdap 07/13/2012   Zoster, Live 03/07/2014    Past Medical History:  Diagnosis Date   AAA (abdominal aortic aneurysm) without rupture    followed by dr Derek Jack--  per office note dated 11-05-2018  3.5cm   Benign essential HTN    cardiologist-- dr Margart Sickles   Chronic combined systolic (congestive) and diastolic (congestive) heart failure [facility])     cardiologist-- dr Robb Matar   CKD (chronic kidney disease), stage III ([place])    Diabetes mellitus type 2, insulin dependent ([place])    followed by dr Virgina Jock   Diabetic neuropathy ([place])    DOE (dyspnea on exertion)    Edema of both lower extremities    Full dentures    HOH (hard of hearing)    does wear hearing aids   Hydrocele, bilateral    Lichen planus    Mixed hyperlipidemia    Morbid obesity with BMI of 45.0-49.9, adult ([place])    Nocturia    OA (osteoarthritis)    OSA on CPAP 10/14/08   per study  AHI  61.8/hr ,  RDI  96.1/hr.     Tobacco History: Social History   Tobacco Use  Smoking Status Former   Packs/day: 1.50   Years: 10.00   Pack years: 15.00   Types: Cigarettes   Quit date: 12/14/1971   Years since quitting: 50.0  Smokeless Tobacco Former   Types: Snuff, Chew   Counseling given: Not Answered   Outpatient Medications Prior to Visit  Medication Sig Dispense Refill   apixaban (ELIQUIS) 5 MG TABS tablet Take 2 tablets (10mg ) twice daily for 7 days, then 1 tablet (5mg ) twice daily 60 tablet 0   aspirin EC 81 MG tablet Take 81 mg by mouth daily.     BIDIL 20-37.5 MG tablet Take 2 tablets by mouth 3 (three) times daily. 540 tablet 3   carvedilol (COREG) 25 MG tablet TAKE TWO TABLETS BY MOUTH TWICE DAILY with meals 180 tablet 0   clobetasol ointment (TEMOVATE) 1.49 % Apply 1 application topically daily as needed (for itching).  Cyanocobalamin 2500 MCG TABS Take 500 mcg by mouth daily.      diclofenac sodium (VOLTAREN) 1 % GEL Apply 1 application topically 4 (four) times daily as needed (For pain.).     Dulaglutide 1.5 MG/0.5ML SOPN Inject 1 Dose into the skin once a week. Wednesday's     felodipine (PLENDIL) 5 MG 24 hr tablet TAKE TWO TABLETS BY MOUTH DAILY 180 tablet 0   furosemide (LASIX) 20 MG tablet TAKE ONE TABLET BY MOUTH ONE TIME DAILY *TAKE 1 TAB TWICE DAILY FOR 3-5 DAYS IF INCREASED LEG SWELLING OR SHORTNESS OF BREATH* (Patient taking differently: 40 mg.  Patient takes 40 mg daily.  Take additional 20 mg 3-5 days if swelling per patient.) 90 tablet 0   HYDROcodone-acetaminophen (NORCO/VICODIN) 5-325 MG tablet 2 (two) times daily.     loratadine (CLARITIN) 10 MG tablet Take 10 mg by mouth every morning.      losartan (COZAAR) 100 MG tablet Take 100 mg by mouth every evening.      MEGARED OMEGA-3 KRILL OIL PO Take 1 tablet by mouth daily.     metFORMIN (GLUCOPHAGE) 1000 MG tablet Take 1,000 mg by mouth 2 (two) times daily with a meal.     Multiple Vitamin (MULTIVITAMIN WITH MINERALS) TABS tablet Take 1 tablet by mouth daily.     ONETOUCH VERIO test strip USE TO TEST BID AS DIRECTED  2   Polyethyl Glycol-Propyl Glycol (SYSTANE OP) Apply to eye as needed.     potassium chloride SA (K-DUR) 20 MEQ tablet      simvastatin (ZOCOR) 40 MG tablet Take 40 mg by mouth every evening.     spironolactone (ALDACTONE) 50 MG tablet Take 1 tablet by mouth daily.     Turmeric 400 MG CAPS Take by mouth daily.     urea (CARMOL) 10 % cream Apply topically as needed.     No facility-administered medications prior to visit.      Review of Systems  Review of Systems N/a  Physical Exam  BP 134/76 (BP Location: Left Arm, Patient Position: Sitting, Cuff Size: Normal)    Pulse 68    Temp 98 F (36.7 C) (Oral)    Ht 6\' 2"  (1.88 m)    Wt (!) 308 lb (139.7 kg)    SpO2 98%    BMI 39.54 kg/m  Physical Exam  Neuro: Well-appearing, no acute distress Eyes: EOMI, no icterus Neck: Supple, no JVP appreciated Pulmonary: Clear, normal work of breathing Cardiovascular: Regular rate and rhythm, no murmurs   Lab Results: Personally reviewed CBC    Component Value Date/Time   WBC 5.7 07/16/2021 0435   RBC 4.29 07/16/2021 0435   HGB 12.0 (L) 07/16/2021 0435   HGB 14.1 09/10/2017 1056   HCT 38.0 (L) 07/16/2021 0435   HCT 42.3 09/10/2017 1056   PLT 181 07/16/2021 0435   PLT 141 (L) 09/10/2017 1056   MCV 88.6 07/16/2021 0435   MCV 81 09/10/2017 1056   MCH 28.0  07/16/2021 0435   MCHC 31.6 07/16/2021 0435   RDW 14.3 07/16/2021 0435   RDW 15.1 09/10/2017 1056   LYMPHSABS 1.6 07/13/2021 1339   MONOABS 1.7 (H) 07/13/2021 1339   EOSABS 0.1 07/13/2021 1339   BASOSABS 0.0 07/13/2021 1339    BMET    Component Value Date/Time   NA 134 (L) 07/14/2021 0122   NA 140 11/09/2020 0806   K 3.5 07/14/2021 0122   CL 97 (L) 07/14/2021 0122   CO2 28 07/14/2021  0122   GLUCOSE 152 (H) 07/14/2021 0122   BUN 17 07/14/2021 0122   BUN 16 11/09/2020 0806   CREATININE 1.29 (H) 07/14/2021 0122   CREATININE 1.14 03/03/2017 1028   CALCIUM 8.4 (L) 07/14/2021 0122   GFRNONAA 56 (L) 07/14/2021 0122   GFRAA 57 (L) 11/09/2020 0806    BNP    Component Value Date/Time   BNP 15.9 07/13/2021 1339    ProBNP No results found for: PROBNP  Imaging: Personally reviewed and interpreted as CTA 07/13/2021 with no PE seen right lower lobe wedge-shaped infiltrate concerning for infarct Nuclear medicine perfusion scan 07/15/2021 without evidence of pulmonary embolus  Assessment & Plan:   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   Presumed related to blood clot.  Discussed risk and benefits of ongoing anticoagulation.  Given unprovoked nature would recommend lifelong which can understands.  OSA on CPAP: continue CPAP, oxygen bleed in.  He reports good adherence.  New DME supplies ordered today.  Awaiting new CPAP machine from DME company, wait over a year at this point.   Lanier Clam, MD 01/01/2022

## 2022-01-06 DIAGNOSIS — N1831 Chronic kidney disease, stage 3a: Secondary | ICD-10-CM | POA: Diagnosis not present

## 2022-01-06 DIAGNOSIS — M1990 Unspecified osteoarthritis, unspecified site: Secondary | ICD-10-CM | POA: Diagnosis not present

## 2022-01-06 DIAGNOSIS — I5030 Unspecified diastolic (congestive) heart failure: Secondary | ICD-10-CM | POA: Diagnosis not present

## 2022-01-06 DIAGNOSIS — D696 Thrombocytopenia, unspecified: Secondary | ICD-10-CM | POA: Diagnosis not present

## 2022-01-06 DIAGNOSIS — Z23 Encounter for immunization: Secondary | ICD-10-CM | POA: Diagnosis not present

## 2022-01-06 DIAGNOSIS — I5042 Chronic combined systolic (congestive) and diastolic (congestive) heart failure: Secondary | ICD-10-CM | POA: Diagnosis not present

## 2022-01-06 DIAGNOSIS — I130 Hypertensive heart and chronic kidney disease with heart failure and stage 1 through stage 4 chronic kidney disease, or unspecified chronic kidney disease: Secondary | ICD-10-CM | POA: Diagnosis not present

## 2022-01-06 DIAGNOSIS — I2699 Other pulmonary embolism without acute cor pulmonale: Secondary | ICD-10-CM | POA: Diagnosis not present

## 2022-01-06 DIAGNOSIS — G629 Polyneuropathy, unspecified: Secondary | ICD-10-CM | POA: Diagnosis not present

## 2022-01-06 DIAGNOSIS — G4733 Obstructive sleep apnea (adult) (pediatric): Secondary | ICD-10-CM | POA: Diagnosis not present

## 2022-01-06 DIAGNOSIS — E1129 Type 2 diabetes mellitus with other diabetic kidney complication: Secondary | ICD-10-CM | POA: Diagnosis not present

## 2022-01-08 ENCOUNTER — Other Ambulatory Visit: Payer: Self-pay

## 2022-01-08 DIAGNOSIS — I2510 Atherosclerotic heart disease of native coronary artery without angina pectoris: Secondary | ICD-10-CM

## 2022-01-08 DIAGNOSIS — I712 Thoracic aortic aneurysm, without rupture, unspecified: Secondary | ICD-10-CM

## 2022-01-09 ENCOUNTER — Other Ambulatory Visit: Payer: Self-pay | Admitting: Surgery

## 2022-01-09 ENCOUNTER — Ambulatory Visit: Payer: Medicare HMO

## 2022-01-09 ENCOUNTER — Other Ambulatory Visit: Payer: Self-pay

## 2022-01-09 DIAGNOSIS — I2510 Atherosclerotic heart disease of native coronary artery without angina pectoris: Secondary | ICD-10-CM | POA: Diagnosis not present

## 2022-01-09 DIAGNOSIS — I712 Thoracic aortic aneurysm, without rupture, unspecified: Secondary | ICD-10-CM

## 2022-01-13 NOTE — Progress Notes (Unsigned)
Follow up visit  Subjective:   Bradley Alvarez, male    DOB: 01/17/1941, 81 y.o.   MRN: [id number]   HPI   No chief complaint on file.   81 y.o. African-American male  with uncontrolled hypertension, uncontrolled type II diabetes mellitus, CKD stage 2/3, OSA on CPAP, heart failure with preserved ejection fraction.  Patient is doing well, denies chest pain. Exertional dyspnea is mild and unchanged. Leg edema has resolved. Blood pressure slightly elevated today.  He saw Dr. Cyndia Bent in 01/2021, who recommended continued BP control and surveillance for TAA.  Current Outpatient Medications on File Prior to Visit  Medication Sig Dispense Refill   apixaban (ELIQUIS) 5 MG TABS tablet Take 2 tablets (28m) twice daily for 7 days, then 1 tablet (567m twice daily 60 tablet 0   aspirin EC 81 MG tablet Take 81 mg by mouth daily.     BIDIL 20-37.5 MG tablet Take 2 tablets by mouth 3 (three) times daily. 540 tablet 3   carvedilol (COREG) 25 MG tablet TAKE TWO TABLETS BY MOUTH TWICE DAILY with meals 180 tablet 0   clobetasol ointment (TEMOVATE) 0.2.35 Apply 1 application topically daily as needed (for itching).      Cyanocobalamin 2500 MCG TABS Take 500 mcg by mouth daily.      diclofenac sodium (VOLTAREN) 1 % GEL Apply 1 application topically 4 (four) times daily as needed (For pain.).     Dulaglutide 1.5 MG/0.5ML SOPN Inject 1 Dose into the skin once a week. Wednesday's     felodipine (PLENDIL) 5 MG 24 hr tablet TAKE TWO TABLETS BY MOUTH DAILY 180 tablet 0   furosemide (LASIX) 20 MG tablet TAKE ONE TABLET BY MOUTH ONE TIME DAILY *TAKE 1 TAB TWICE DAILY FOR 3-5 DAYS IF INCREASED LEG SWELLING OR SHORTNESS OF BREATH* (Patient taking differently: 40 mg. Patient takes 40 mg daily.  Take additional 20 mg 3-5 days if swelling per patient.) 90 tablet 0   HYDROcodone-acetaminophen (NORCO/VICODIN) 5-325 MG tablet 2 (two) times daily.     loratadine (CLARITIN) 10 MG tablet Take 10 mg by mouth every morning.       losartan (COZAAR) 100 MG tablet Take 100 mg by mouth every evening.      MEGARED OMEGA-3 KRILL OIL PO Take 1 tablet by mouth daily.     metFORMIN (GLUCOPHAGE) 1000 MG tablet Take 1,000 mg by mouth 2 (two) times daily with a meal.     Multiple Vitamin (MULTIVITAMIN WITH MINERALS) TABS tablet Take 1 tablet by mouth daily.     ONETOUCH VERIO test strip USE TO TEST BID AS DIRECTED  2   Polyethyl Glycol-Propyl Glycol (SYSTANE OP) Apply to eye as needed.     potassium chloride SA (K-DUR) 20 MEQ tablet      simvastatin (ZOCOR) 40 MG tablet Take 40 mg by mouth every evening.     spironolactone (ALDACTONE) 50 MG tablet Take 1 tablet by mouth daily.     Turmeric 400 MG CAPS Take by mouth daily.     urea (CARMOL) 10 % cream Apply topically as needed.     No current facility-administered medications on file prior to visit.    Cardiovascular & other pertient studies:  Echocardiogram 01/09/2022: Normal LV systolic function with visual EF 55-60%. Left ventricle cavity is normal in size. Moderate to severe  left ventricular hypertrophy. Normal global wall motion. Doppler evidence of grade II diastolic dysfunction, normal LAP.  Left atrial cavity is severely dilated. Trace  aortic regurgitation. Aortic root dilated (Sinus of valsalva 4.5cm). Proximal ascending aorta dilated 4.2cm.  Compared to study 09/21/2020 LVEF remains preserved, Mild MR is now resolved, Mild AR is now trace, proximal ascending is dilated 96m (prior not well visualized).    EKG 05/02/2021: Sinus rhythm 60 bpm  Leftward axis Borderline first degree AV block Low voltage in precordial leads Poor R-wave progression  CTA Chest 01/15/2021: 1. 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 Enlargement of the descending thoracic aorta measuring up to 3.3 x 3.2 cm. Ascending thoracic aortic aneurysm. Recommend semi-annual imaging followup by CTA or MRA and referral to cardiothoracic surgery if not  already obtained. This recommendation follows 2010 ACCF/AHA/AATS/ACR/ASA/SCA/SCAI/SIR/STS/SVM Guidelines for the Diagnosis and Management of Patients With Thoracic Aortic Disease. Circulation. 2010; 121:: [id number] Aortic aneurysm NOS (ICD10-I71.9) 2. Elevation of the right hemidiaphragm with associated scarring or atelectasis of the right lower and middle lobes. 3. Coronary artery disease. 4. Enlargement of the main pulmonary artery as can be seen in pulmonary hypertension. 5. Cholelithiasis.   Aortic Atherosclerosis (ICD10-I70.0).  EKG 11/01/2020: Sinus rhythm 68 bpm Old anterior infarct Poor R-wave progression  Echocardiogram 09/21/2020:  1. Normal LV systolic function with visual EF 50-55%. Left ventricle  cavity is normal in size. Severe left ventricular hypertrophy. Normal  global wall motion. Doppler evidence of grade II (pseudonormal) diastolic  dysfunction, elevated LAP.  2. Left atrial cavity is moderately dilated.  3. Mild (Grade I) mitral regurgitation.  4. Mild (Grade I) aortic regurgitation.  5. Insignificant pericardial effusion.  6. The aortic root is dilated, Sinotubular junction 4.6cm. Proximal  ascending aorta not will visualized.  7. Compared to prior study dated 09/09/2019: No significant changes noted.  Abdominal Aortic Duplex 09/21/2020:  Moderate dilatation of the abdominal aorta is noted in the mid and distal  aorta. Abdominal aortic aneurysm observed. An abdominal aortic aneurysm  measuring 3.66 x 3.62 x 3.61 cm is seen.  Normal iliac artery velocity right and mildly dampened left iliac artery  velocity and may suggest suggest <50% stenosis.  No significant change from 09/19/2019. Recheck in 3 years.   Recent labs: 11/09/2020: Glucose 99, BUN/Cr 16/1.36. EGFR 57. Na/K 140/4.5.   04/24/2020: Glucose 92, BUN/Cr 10/1.3. EGFR 64. Na 141. Rest of the CMP normal H/H 13/42.  HbA1C 5.5 % Chol 134, TG 125, HDL 47, LDL 62 TSH 2.57 normal   Review of  Systems  Cardiovascular:  Negative for chest pain, dyspnea on exertion, leg swelling, palpitations and syncope.      There were no vitals filed for this visit.    There is no height or weight on file to calculate BMI. There were no vitals filed for this visit.    Objective:   Physical Exam Vitals and nursing note reviewed.  Constitutional:      General: He is not in acute distress. Neck:     Vascular: No JVD.  Cardiovascular:     Rate and Rhythm: Normal rate and regular rhythm.     Pulses: Intact distal pulses.          Carotid pulses are 2+ on the right side and 2+ on the left side.      Dorsalis pedis pulses are 2+ on the right side and 2+ on the left side.       Posterior tibial pulses are 2+ on the right side and 2+ on the left side.     Heart sounds: Normal heart sounds. No  murmur heard.   No gallop.  Pulmonary:     Effort: Pulmonary effort is normal.     Breath sounds: Normal breath sounds. No wheezing or rales.  Neurological:     Cranial Nerves: No cranial nerve deficit.          Assessment & Recommendations:   81 y.o. African-American male  with uncontrolled hypertension, uncontrolled type II diabetes mellitus, CKD stage 2/3, OSA on CPAP, heart failure with preserved ejection fraction.  Essential hypertension: Well controlled. No change made today.  Thoracic aortic aneurysm without rupture [facility]): CTA 01/2021: Enlargement of the tubular ascending thoracic aorta measuring up to 5.0 x 4.9 cm.  Enlargement of the descending thoracic aorta measuring up to 3.3 x 3.2 cm.  Seen by Dr. Cyndia Bent in 01/2021, recommended continued medical management and surveillance Will check echocardiogram in 10/2021, CTA in 01/2022. Continue losartan, carvedilol  Hypertension: On multiple agents. BP control remains crucial to slowing progression of TAA. Arranged for remote patient monitoring through pur pharmacist Bradley Alvarez. No change made today. Continue CPAP for OSA.    Coronary artery disease: Seen on CTA No angina symptoms at this time. He will likely need coronary evaluation if and when he requires TAA repair Continue Aspirin 81 mg,  LDL 62 on simvastatin 40 mg. Continue the same.  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 Repeat aorta duplex in 3 years  F/u in 6 months Will get labs recently performed at Dr. Keane Police office.  Bradley Mormon, MD [facility] Pager: [phone] Office: [phone]

## 2022-01-16 DIAGNOSIS — G4733 Obstructive sleep apnea (adult) (pediatric): Secondary | ICD-10-CM | POA: Diagnosis not present

## 2022-01-16 DIAGNOSIS — I2699 Other pulmonary embolism without acute cor pulmonale: Secondary | ICD-10-CM | POA: Diagnosis not present

## 2022-01-17 ENCOUNTER — Other Ambulatory Visit: Payer: Self-pay

## 2022-01-17 ENCOUNTER — Encounter: Payer: Self-pay | Admitting: Cardiology

## 2022-01-17 ENCOUNTER — Ambulatory Visit: Payer: Medicare HMO | Admitting: Cardiology

## 2022-01-17 VITALS — BP 134/76 | Ht 74.0 in | Wt 307.0 lb

## 2022-01-17 DIAGNOSIS — I712 Thoracic aortic aneurysm, without rupture, unspecified: Secondary | ICD-10-CM

## 2022-01-17 DIAGNOSIS — I10 Essential (primary) hypertension: Secondary | ICD-10-CM | POA: Diagnosis not present

## 2022-01-17 NOTE — Progress Notes (Signed)
Telephone visit  Subjective:   Bradley Alvarez, male    DOB: 1941-02-27, 81 y.o.   MRN: [id number]  I connected with the patient on 01/17/2022 by a telephone call and verified that I am speaking with the correct person using two identifiers.     I offered the patient a video enabled application for a virtual visit. Unfortunately, this could not be accomplished due to technical difficulties/lack of video enabled phone/computer. I discussed the limitations of evaluation and management by telemedicine and the availability of in person appointments. The patient expressed understanding and agreed to proceed.   This visit type was conducted due to national recommendations for restrictions regarding the COVID-19 Pandemic (e.g. social distancing).  This format is felt to be most appropriate for this patient at this time.  All issues noted in this document were discussed and addressed.  No physical exam was performed (except for noted visual exam findings with Tele health visits).  The patient has consented to conduct a Tele health visit and understands insurance will be billed.   HPI   Chief Complaint  Patient presents with   TAA    81 y.o. African-American male  with uncontrolled hypertension, uncontrolled type II diabetes mellitus, CKD stage 2/3, OSA on CPAP, heart failure with preserved ejection fraction.  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  He has since been on supplemental oxygen and Eliquis, recommended lifelong.  He is seeing pulmonologist Dr. Silas Flood outpatient.  His using walker, able to walk half a mile.  He has an upcoming appointment with Dr. Cyndia Bent in March regarding his ascending aorta aneurysm.  This was not clearly seen on PE protocol CT angiogram in 07/2021, but was noted on CT angiogram for aorta in  01/2021.   Current Outpatient Medications on File Prior to Visit  Medication Sig Dispense Refill   apixaban (ELIQUIS) 5 MG TABS tablet Take 2 tablets (27m) twice daily for 7 days, then 1 tablet (587m twice daily 60 tablet 0   aspirin EC 81 MG tablet Take 81 mg by mouth daily.     BIDIL 20-37.5 MG tablet Take 2 tablets by mouth 3 (three) times daily. 540 tablet 3   carvedilol (COREG) 25 MG tablet TAKE TWO TABLETS BY MOUTH TWICE DAILY with meals 180 tablet 0   clobetasol ointment (TEMOVATE) 0.8.75 Apply 1 application topically daily as needed (for itching).      Cyanocobalamin 2500 MCG TABS Take 500 mcg by mouth daily.      diclofenac sodium (VOLTAREN) 1 % GEL Apply 1 application topically 4 (four) times daily as needed (For pain.).     Dulaglutide 1.5 MG/0.5ML SOPN Inject 1 Dose into the skin once a week. Wednesday's     felodipine (PLENDIL) 5 MG 24 hr tablet TAKE TWO TABLETS BY MOUTH DAILY 180 tablet 0   furosemide (LASIX) 20 MG tablet TAKE ONE TABLET BY MOUTH ONE TIME DAILY *TAKE 1 TAB TWICE DAILY FOR 3-5 DAYS IF INCREASED LEG SWELLING OR SHORTNESS OF BREATH* (Patient taking differently: 40 mg. Patient takes 40 mg daily.  Take additional 20 mg 3-5 days if swelling per patient.) 90 tablet 0   HYDROcodone-acetaminophen (NORCO/VICODIN) 5-325 MG tablet 2 (two) times daily.     loratadine (CLARITIN) 10 MG tablet Take 10 mg by mouth every morning.  losartan (COZAAR) 100 MG tablet Take 100 mg by mouth every evening.      MEGARED OMEGA-3 KRILL OIL PO Take 1 tablet by mouth daily.     metFORMIN (GLUCOPHAGE) 1000 MG tablet Take 1,000 mg by mouth 2 (two) times daily with a meal.     Multiple Vitamin (MULTIVITAMIN WITH MINERALS) TABS tablet Take 1 tablet by mouth daily.     ONETOUCH VERIO test strip USE TO TEST BID AS DIRECTED  2   Polyethyl Glycol-Propyl Glycol (SYSTANE OP) Apply to eye as needed.     potassium chloride SA (K-DUR) 20 MEQ tablet      simvastatin (ZOCOR) 40 MG tablet Take 40 mg by  mouth every evening.     spironolactone (ALDACTONE) 50 MG tablet Take 1 tablet by mouth daily.     Turmeric 400 MG CAPS Take by mouth daily.     urea (CARMOL) 10 % cream Apply topically as needed.     No current facility-administered medications on file prior to visit.    Cardiovascular & other pertient studies:  Echocardiogram 01/09/2022:  Normal LV systolic function with visual EF 55-60%. Left ventricle cavity  is normal in size. Moderate to severe  left ventricular hypertrophy.  Normal global wall motion. Doppler evidence of grade II diastolic  dysfunction, normal LAP.  Left atrial cavity is severely dilated.  Trace aortic regurgitation.  Aortic root dilated (Sinus of valsalva 4.5cm). Proximal ascending aorta  dilated 4.2cm.  Compared to study 09/21/2020 LVEF remains preserved, Mild MR is now  resolved, Mild AR is now trace, proximal ascending is dilated 11m (prior  not well visualized).   CTA chest (PE protocol) 07/13/2021: 1. No central obstructing pulmonary embolism identified. 2. However, there is an acute wedge-shaped consolidation abutting the pleura of the RIGHT lower lobe with surrounding groundglass opacity, suspicious for pulmonary infarct. No pulmonary embolism identified, but this pulmonary finding is suspicious for occult PE within a peripheral subsegmental branch to the RIGHT lower lobe. 3. Additional patchy consolidations at the bilateral lung bases, atelectasis versus multifocal pneumonia. 4. Cholelithiasis without evidence of acute cholecystitis.  EKG 05/02/2021: Sinus rhythm 60 bpm  Leftward axis Borderline first degree AV block Low voltage in precordial leads Poor R-wave progression  CTA Chest 01/15/2021: 1. 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 Enlargement of the descending thoracic aorta measuring up to 3.3 x 3.2 cm. Ascending thoracic aortic aneurysm. Recommend semi-annual imaging followup by CTA or MRA and  referral to cardiothoracic surgery if not already obtained. This recommendation follows 2010 ACCF/AHA/AATS/ACR/ASA/SCA/SCAI/SIR/STS/SVM Guidelines for the Diagnosis and Management of Patients With Thoracic Aortic Disease. Circulation. 2010; 121:: W258-N277 Aortic aneurysm NOS (ICD10-I71.9) 2. Elevation of the right hemidiaphragm with associated scarring or atelectasis of the right lower and middle lobes. 3. Coronary artery disease. 4. Enlargement of the main pulmonary artery as can be seen in pulmonary hypertension. 5. Cholelithiasis.   Aortic Atherosclerosis (ICD10-I70.0).  EKG 11/01/2020: Sinus rhythm 68 bpm Old anterior infarct Poor R-wave progression  Echocardiogram 09/21/2020:  1. Normal LV systolic function with visual EF 50-55%. Left ventricle  cavity is normal in size. Severe left ventricular hypertrophy. Normal  global wall motion. Doppler evidence of grade II (pseudonormal) diastolic  dysfunction, elevated LAP.  2. Left atrial cavity is moderately dilated.  3. Mild (Grade I) mitral regurgitation.  4. Mild (Grade I) aortic regurgitation.  5. Insignificant pericardial effusion.  6. The aortic root is dilated, Sinotubular junction 4.6cm. Proximal  ascending  aorta not will visualized.  7. Compared to prior study dated 09/09/2019: No significant changes noted.  Abdominal Aortic Duplex 09/21/2020:  Moderate dilatation of the abdominal aorta is noted in the mid and distal  aorta. Abdominal aortic aneurysm observed. An abdominal aortic aneurysm  measuring 3.66 x 3.62 x 3.61 cm is seen.  Normal iliac artery velocity right and mildly dampened left iliac artery  velocity and may suggest suggest <50% stenosis.  No significant change from 09/19/2019. Recheck in 3 years.   Recent labs: 11/09/2020: Glucose 99, BUN/Cr 16/1.36. EGFR 57. Na/K 140/4.5.   04/24/2020: Glucose 92, BUN/Cr 10/1.3. EGFR 64. Na 141. Rest of the CMP normal H/H 13/42.  HbA1C 5.5 % Chol 134, TG 125, HDL  47, LDL 62 TSH 2.57 normal   Review of Systems  Cardiovascular:  Positive for dyspnea on exertion. Negative for chest pain, leg swelling, palpitations and syncope.      Vitals:   01/06/22 1317  BP: 134/76      Body mass index is 39.42 kg/m. There were no vitals filed for this visit.    Objective:   Physical Exam Vitals and nursing note reviewed.  Constitutional:      General: He is not in acute distress. Neck:     Vascular: No JVD.  Cardiovascular:     Rate and Rhythm: Normal rate and regular rhythm.     Pulses: Intact distal pulses.          Carotid pulses are 2+ on the right side and 2+ on the left side.      Dorsalis pedis pulses are 2+ on the right side and 2+ on the left side.       Posterior tibial pulses are 2+ on the right side and 2+ on the left side.     Heart sounds: Normal heart sounds. No murmur heard.   No gallop.  Pulmonary:     Effort: Pulmonary effort is normal.     Breath sounds: Normal breath sounds. No wheezing or rales.  Neurological:     Cranial Nerves: No cranial nerve deficit.          Assessment & Recommendations:   81 y.o. African-American male  with uncontrolled hypertension, uncontrolled type II diabetes mellitus, CKD stage 2/3, OSA on CPAP, heart failure with preserved ejection fraction.  Essential hypertension: Well controlled. No change made today.  Thoracic aortic aneurysm without rupture [facility]): CTA 01/2021: Enlargement of the tubular ascending thoracic aorta measuring up to 5.0 x 4.9 cm.  Enlargement of the descending thoracic aorta measuring up to 3.3 x 3.2 cm.  Seen by Dr. Cyndia Bent in 01/2021, recommended continued medical management and surveillance Upcoming CTA and appt w/Dr [facility] in 01/2022. Continue losartan, carvedilol  Hypertension: Well controlled  Coronary artery disease: Seen on CTA No angina symptoms at this time. In presence of eliquis for PE treatment, I have stopped his Aspirin to redue bleeding risk/ LDL  controlled on simvastatin 40 mg. Continue the same.  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 Repeat aorta duplex in 3 years  F/u in 6 months  Chinaza Rooke Esther Hardy, MD [facility] Pager: [phone] Office: [phone]

## 2022-01-20 ENCOUNTER — Other Ambulatory Visit: Payer: Self-pay

## 2022-01-20 DIAGNOSIS — I712 Thoracic aortic aneurysm, without rupture, unspecified: Secondary | ICD-10-CM

## 2022-01-20 DIAGNOSIS — I10 Essential (primary) hypertension: Secondary | ICD-10-CM

## 2022-01-20 MED ORDER — CARVEDILOL 25 MG PO TABS: ORAL_TABLET | ORAL | 0 refills | Status: DC

## 2022-01-28 DIAGNOSIS — E1129 Type 2 diabetes mellitus with other diabetic kidney complication: Secondary | ICD-10-CM | POA: Diagnosis not present

## 2022-01-28 DIAGNOSIS — I130 Hypertensive heart and chronic kidney disease with heart failure and stage 1 through stage 4 chronic kidney disease, or unspecified chronic kidney disease: Secondary | ICD-10-CM | POA: Diagnosis not present

## 2022-01-28 DIAGNOSIS — M1990 Unspecified osteoarthritis, unspecified site: Secondary | ICD-10-CM | POA: Diagnosis not present

## 2022-01-28 DIAGNOSIS — N1831 Chronic kidney disease, stage 3a: Secondary | ICD-10-CM | POA: Diagnosis not present

## 2022-02-03 DIAGNOSIS — G4733 Obstructive sleep apnea (adult) (pediatric): Secondary | ICD-10-CM | POA: Diagnosis not present

## 2022-02-05 ENCOUNTER — Telehealth: Payer: Self-pay | Admitting: *Deleted

## 2022-02-05 NOTE — Telephone Encounter (Signed)
Order for replacement CPAP machine has been sent to [employer] via [employer] portal. ?

## 2022-02-13 DIAGNOSIS — I2699 Other pulmonary embolism without acute cor pulmonale: Secondary | ICD-10-CM | POA: Diagnosis not present

## 2022-02-13 DIAGNOSIS — G4733 Obstructive sleep apnea (adult) (pediatric): Secondary | ICD-10-CM | POA: Diagnosis not present

## 2022-02-21 ENCOUNTER — Other Ambulatory Visit: Payer: Self-pay

## 2022-02-21 ENCOUNTER — Encounter: Payer: Self-pay | Admitting: Podiatry

## 2022-02-21 ENCOUNTER — Ambulatory Visit: Payer: Medicare HMO | Admitting: Podiatry

## 2022-02-21 DIAGNOSIS — D689 Coagulation defect, unspecified: Secondary | ICD-10-CM | POA: Diagnosis not present

## 2022-02-21 DIAGNOSIS — B351 Tinea unguium: Secondary | ICD-10-CM | POA: Diagnosis not present

## 2022-02-21 DIAGNOSIS — M2140 Flat foot [pes planus] (acquired), unspecified foot: Secondary | ICD-10-CM | POA: Diagnosis not present

## 2022-02-21 DIAGNOSIS — E119 Type 2 diabetes mellitus without complications: Secondary | ICD-10-CM | POA: Diagnosis not present

## 2022-02-21 DIAGNOSIS — M79675 Pain in left toe(s): Secondary | ICD-10-CM | POA: Diagnosis not present

## 2022-02-21 DIAGNOSIS — M79674 Pain in right toe(s): Secondary | ICD-10-CM

## 2022-02-21 NOTE — Progress Notes (Signed)
This patient returns to my office for at risk foot care.  This patient requires this care by a professional since this patient will be at risk due to having  Diabetes.  This patient is unable to cut nails himself since the patient cannot reach his nails.These nails are painful walking and wearing shoes.  This patient presents for at risk foot care today. ? ?General Appearance  Alert, conversant and in no acute stress. ? ?Vascular  Dorsalis pedis  are  weakly  palpable  bilaterally.  Posterior tibial pulses are absent due to ankle swelling.  Absent pedal hair.Capillary return is within normal limits  bilaterally. Cold feet bilaterally. ? ?Neurologic  Senn-Weinstein monofilament wire test within normal limits  bilaterally. Muscle power within normal limits bilaterally. ? ?Nails Thick disfigured discolored nails with subungual debris  from hallux to fifth toes bilaterally. No evidence of bacterial infection or drainage bilaterally. ? ?Orthopedic  No limitations of motion  feet .  No crepitus or effusions noted.  No bony pathology or digital deformities noted. HAV  B/L.  PTT tendinitis/arthritis left foot.  Pes planus ? ?Skin  normotropic skin with no porokeratosis noted bilaterally.  No signs of infections or ulcers noted.    ? ?Onychomycosis  Pain in right toes  Pain in left toes ? ?Consent was obtained for treatment procedures.   Mechanical debridement of nails 1-5  bilaterally performed with a nail nipper.  Filed with dremel without incident.  ? ? ?Return office visit   3 months                   Told patient to return for periodic foot care and evaluation due to potential at risk complications. ? ? ?Gardiner Barefoot DPM  ?

## 2022-02-26 ENCOUNTER — Ambulatory Visit
Admission: RE | Admit: 2022-02-26 | Discharge: 2022-02-26 | Disposition: A | Discharge status: Home / Self Care | Payer: Medicare HMO | Source: Ambulatory Visit | Attending: Surgery | Admitting: Surgery

## 2022-02-26 ENCOUNTER — Ambulatory Visit: Payer: Medicare HMO | Admitting: Surgery

## 2022-02-26 ENCOUNTER — Encounter: Payer: Self-pay | Admitting: Surgery

## 2022-02-26 VITALS — BP 155/76 | HR 67 | Resp 20 | Ht 74.0 in | Wt 307.0 lb

## 2022-02-26 DIAGNOSIS — I712 Thoracic aortic aneurysm, without rupture, unspecified: Secondary | ICD-10-CM | POA: Diagnosis not present

## 2022-02-26 DIAGNOSIS — I77810 Thoracic aortic ectasia: Secondary | ICD-10-CM | POA: Diagnosis not present

## 2022-02-26 MED ORDER — IOPAMIDOL (ISOVUE-300) INJECTION 61%
75.0000 mL | Freq: Once | INTRAVENOUS | Status: AC | PRN
  Administered 2022-02-26: 75 mL via INTRAVENOUS

## 2022-02-27 NOTE — Progress Notes (Signed)
? ? ?HPI: ? ?The patient is an 81 year old gentleman with a history of type 2 diabetes, stage III chronic kidney disease, hypertension, hyperlipidemia, morbid obesity, OSA on CPAP, abdominal aortic aneurysm measuring 3.6 cm on recent aortic duplex, and combined chronic systolic and diastolic congestive heart failure followed by Dr. Virgina Jock.  I last saw him on 02/06/2021 for initial consultation concerning a 5 x 4.9 cm fusiform ascending aortic aneurysm by CT scan.  I reviewed his previous CT scans dating back to 2010 and it did not appear that the aneurysm had changed much since then.  He continues to remain asymptomatic without chest or back pain.  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  He uses a stand-up rolling walker with arm supports for balance. ? ?Current Outpatient Medications  ?Medication Sig Dispense Refill  ? apixaban (ELIQUIS) 5 MG TABS tablet Take 2 tablets ('10mg'$ ) twice daily for 7 days, then 1 tablet ('5mg'$ ) twice daily 60 tablet 0  ? BIDIL 20-37.5 MG tablet Take 2 tablets by mouth 3 (three) times daily. 540 tablet 3  ? carvedilol (COREG) 25 MG tablet TAKE TWO TABLETS BY MOUTH TWICE DAILY with meals 180 tablet 0  ? clobetasol ointment (TEMOVATE) 6.07 % Apply 1 application topically daily as needed (for itching).     ? Cyanocobalamin 2500 MCG TABS Take 500 mcg by mouth daily.     ? diclofenac sodium (VOLTAREN) 1 % GEL Apply 1 application topically 4 (four) times daily as needed (For pain.).    ? Dulaglutide 1.5 MG/0.5ML SOPN Inject 1 Dose into the skin once a week. Wednesday's    ? felodipine (PLENDIL) 5 MG 24 hr tablet TAKE TWO TABLETS BY MOUTH DAILY 180 tablet 0  ? furosemide (LASIX) 20 MG tablet TAKE ONE TABLET BY MOUTH ONE TIME DAILY *TAKE 1 TAB TWICE DAILY FOR 3-5 DAYS IF INCREASED LEG SWELLING OR SHORTNESS OF BREATH* (Patient taking differently: 40 mg. Patient takes 40 mg daily.  ?Take additional 20 mg 3-5 days if swelling per patient.) 90  tablet 0  ? HYDROcodone-acetaminophen (NORCO/VICODIN) 5-325 MG tablet 2 (two) times daily.    ? loratadine (CLARITIN) 10 MG tablet Take 10 mg by mouth every morning.     ? losartan (COZAAR) 100 MG tablet Take 100 mg by mouth every evening.     ? MEGARED OMEGA-3 KRILL OIL PO Take 1 tablet by mouth daily.    ? metFORMIN (GLUCOPHAGE) 1000 MG tablet Take 1,000 mg by mouth daily with breakfast.    ? Multiple Vitamin (MULTIVITAMIN WITH MINERALS) TABS tablet Take 1 tablet by mouth daily.    ? ONETOUCH VERIO test strip USE TO TEST BID AS DIRECTED  2  ? Polyethyl Glycol-Propyl Glycol (SYSTANE OP) Apply to eye as needed.    ? potassium chloride SA (K-DUR) 20 MEQ tablet     ? simvastatin (ZOCOR) 40 MG tablet Take 40 mg by mouth every evening.    ? spironolactone (ALDACTONE) 50 MG tablet Take 1 tablet by mouth daily.    ? Turmeric 400 MG CAPS Take by mouth daily.    ? urea (CARMOL) 10 % cream Apply topically as needed.    ? ?No current facility-administered medications for this visit.  ? ? ? ?Physical Exam: ?BP (!) 155/76   Pulse 67   Resp 20   Ht '6\' 2"'$  (1.88 m)   Wt (!) 307 lb (139.3 kg)   SpO2 92% Comment: RA  BMI 39.42 kg/m?  ?  He looks well. ?Cardiac exam shows a regular rate and rhythm with normal heart sounds.  There is no murmur. ?Lungs are clear. ? ? ?Diagnostic Tests: ? ?Narrative & Impression  ?CLINICAL DATA:  Aortic aneurysm ?  ?EXAM: ?CT ANGIOGRAPHY CHEST WITH CONTRAST ?  ?TECHNIQUE: ?Multidetector CT imaging of the chest was performed using the ?standard protocol during bolus administration of intravenous ?contrast. Multiplanar CT image reconstructions and MIPs were ?obtained to evaluate the vascular anatomy. ?  ?RADIATION DOSE REDUCTION: This exam was performed according to the ?departmental dose-optimization program which includes automated ?exposure control, adjustment of the mA and/or kV according to ?patient size and/or use of iterative reconstruction technique. ?  ?CONTRAST:  37m ISOVUE-300 IOPAMIDOL  (ISOVUE-300) INJECTION 61% ?  ?COMPARISON:  07/13/2021 ?  ?FINDINGS: ?Cardiovascular: There is homogeneous enhancement in thoracic aorta. ?There is ectasia of ascending thoracic aorta measuring 4.7 cm in ?maximum diameter. There is ectasia of main pulmonary artery ?measuring 4.6 cm suggesting pulmonary arterial hypertension. There ?are no intraluminal filling defects in the visualized pulmonary ?artery branches. ?  ?Mediastinum/Nodes: No new significant lymphadenopathy is seen. ?  ?Lungs/Pleura: There are linear densities in the right lower lung ?fields close to the dome of the right hemidiaphragm. There is ?interval decrease in infiltrates in the right lower lung fields. ?There are no new focal pulmonary infiltrates. Small linear densities ?seen in the lingula suggesting subsegmental atelectasis. There is no ?pleural effusion or pneumothorax. ?  ?Upper Abdomen: Gallbladder stones are seen. There is no wall ?thickening in gallbladder. Gallbladder is not distended. There are ?possible small cysts in the visualized upper portions of the kidneys ?largest measuring 2.5 cm in diameter in the right kidney. ?  ?Musculoskeletal: Unremarkable. ?  ?Review of the MIP images confirms the above findings. ?  ?IMPRESSION: ?There is no evidence of thoracic aortic dissection. There is ectasia ?of ascending thoracic aorta measuring 4.7 cm in diameter. There is ?no evidence of pulmonary artery embolism. There is ectasia of main ?pulmonary artery suggesting pulmonary arterial hypertension. ?  ?There are linear densities in the lower lung fields suggesting ?subsegmental atelectasis. There is no new focal pulmonary ?consolidation. There is no pleural effusion or pneumothorax. ?  ?Gallbladder stones.  Possible renal cysts. ?  ?  ?Electronically Signed ?  By: PElmer PickerM.D. ?  On: 02/26/2022 14:29  ? ? ?Impression: ? ?He has a stable fusiform ascending aortic aneurysm with a maximum diameter measured on CTA today of 4.7 cm.  This  is slightly smaller than the previous measurement 1 year ago which was 4.9 x 5 cm.  I have compared both studies and I do not think there is been a significant difference.  His most recent echo in February 2023 showed an aortic root diameter of 4.5 cm at the sinus of Valsalva level and a maximum diameter of the ascending aorta of 4.2 cm.  This showed an ejection fraction of 55 to 60% with moderate to severe LVH and grade 2 diastolic dysfunction.  This is still below the surgical threshold of 5.5 cm.  I would tend to be very conservative in this 82year old gentleman with limited mobility and multiple comorbidities.  I reviewed the CTA images with the patient and his daughter and answered all of their questions.  I stressed the importance of continued good blood pressure control in preventing further enlargement and acute aortic dissection.  I advised him against doing any heavy lifting that may require a Valsalva maneuver and could suddenly raise his blood  pressure to high levels. ? ?Plan: ? ?I will plan to see him back in 1 year with a CTA of the chest. ? ?I spent 20 minutes performing this established patient evaluation and > 50% of this time was spent face to face counseling and coordinating the care of this patient's aortic aneurysm.  ? ? ?Gaye Pollack, MD ?[facility] ?[phone] ? ? ? ? ? ? ?

## 2022-03-06 DIAGNOSIS — G4733 Obstructive sleep apnea (adult) (pediatric): Secondary | ICD-10-CM | POA: Diagnosis not present

## 2022-03-07 DIAGNOSIS — G4733 Obstructive sleep apnea (adult) (pediatric): Secondary | ICD-10-CM | POA: Diagnosis not present

## 2022-03-12 ENCOUNTER — Other Ambulatory Visit: Payer: Self-pay

## 2022-03-12 DIAGNOSIS — I712 Thoracic aortic aneurysm, without rupture, unspecified: Secondary | ICD-10-CM

## 2022-03-12 DIAGNOSIS — I10 Essential (primary) hypertension: Secondary | ICD-10-CM

## 2022-03-12 MED ORDER — CARVEDILOL 25 MG PO TABS: ORAL_TABLET | ORAL | 0 refills | Status: DC

## 2022-03-15 ENCOUNTER — Other Ambulatory Visit: Payer: Self-pay | Admitting: Student

## 2022-03-15 DIAGNOSIS — I10 Essential (primary) hypertension: Secondary | ICD-10-CM

## 2022-03-15 DIAGNOSIS — I712 Thoracic aortic aneurysm, without rupture, unspecified: Secondary | ICD-10-CM

## 2022-03-15 MED ORDER — CARVEDILOL 25 MG PO TABS: ORAL_TABLET | ORAL | 0 refills | Status: DC

## 2022-03-16 DIAGNOSIS — G4733 Obstructive sleep apnea (adult) (pediatric): Secondary | ICD-10-CM | POA: Diagnosis not present

## 2022-03-16 DIAGNOSIS — I2699 Other pulmonary embolism without acute cor pulmonale: Secondary | ICD-10-CM | POA: Diagnosis not present

## 2022-04-06 DIAGNOSIS — G4733 Obstructive sleep apnea (adult) (pediatric): Secondary | ICD-10-CM | POA: Diagnosis not present

## 2022-04-15 DIAGNOSIS — I2699 Other pulmonary embolism without acute cor pulmonale: Secondary | ICD-10-CM | POA: Diagnosis not present

## 2022-04-15 DIAGNOSIS — G4733 Obstructive sleep apnea (adult) (pediatric): Secondary | ICD-10-CM | POA: Diagnosis not present

## 2022-04-23 DIAGNOSIS — G4733 Obstructive sleep apnea (adult) (pediatric): Secondary | ICD-10-CM | POA: Diagnosis not present

## 2022-04-30 DIAGNOSIS — E1129 Type 2 diabetes mellitus with other diabetic kidney complication: Secondary | ICD-10-CM | POA: Diagnosis not present

## 2022-04-30 DIAGNOSIS — I130 Hypertensive heart and chronic kidney disease with heart failure and stage 1 through stage 4 chronic kidney disease, or unspecified chronic kidney disease: Secondary | ICD-10-CM | POA: Diagnosis not present

## 2022-04-30 DIAGNOSIS — E785 Hyperlipidemia, unspecified: Secondary | ICD-10-CM | POA: Diagnosis not present

## 2022-04-30 DIAGNOSIS — I5042 Chronic combined systolic (congestive) and diastolic (congestive) heart failure: Secondary | ICD-10-CM | POA: Diagnosis not present

## 2022-05-01 ENCOUNTER — Other Ambulatory Visit: Payer: Self-pay

## 2022-05-01 DIAGNOSIS — I10 Essential (primary) hypertension: Secondary | ICD-10-CM

## 2022-05-01 DIAGNOSIS — I712 Thoracic aortic aneurysm, without rupture, unspecified: Secondary | ICD-10-CM

## 2022-05-01 MED ORDER — CARVEDILOL 25 MG PO TABS
ORAL_TABLET | ORAL | 3 refills | Status: AC
Start: 2022-05-01 — End: ?

## 2022-05-07 DIAGNOSIS — G4733 Obstructive sleep apnea (adult) (pediatric): Secondary | ICD-10-CM | POA: Diagnosis not present

## 2022-05-13 DIAGNOSIS — R7989 Other specified abnormal findings of blood chemistry: Secondary | ICD-10-CM | POA: Diagnosis not present

## 2022-05-13 DIAGNOSIS — I130 Hypertensive heart and chronic kidney disease with heart failure and stage 1 through stage 4 chronic kidney disease, or unspecified chronic kidney disease: Secondary | ICD-10-CM | POA: Diagnosis not present

## 2022-05-13 DIAGNOSIS — E785 Hyperlipidemia, unspecified: Secondary | ICD-10-CM | POA: Diagnosis not present

## 2022-05-13 DIAGNOSIS — Z125 Encounter for screening for malignant neoplasm of prostate: Secondary | ICD-10-CM | POA: Diagnosis not present

## 2022-05-13 DIAGNOSIS — E1129 Type 2 diabetes mellitus with other diabetic kidney complication: Secondary | ICD-10-CM | POA: Diagnosis not present

## 2022-05-16 DIAGNOSIS — G4733 Obstructive sleep apnea (adult) (pediatric): Secondary | ICD-10-CM | POA: Diagnosis not present

## 2022-05-16 DIAGNOSIS — I2699 Other pulmonary embolism without acute cor pulmonale: Secondary | ICD-10-CM | POA: Diagnosis not present

## 2022-05-18 ENCOUNTER — Emergency Department (HOSPITAL_COMMUNITY): Payer: Medicare HMO

## 2022-05-18 ENCOUNTER — Other Ambulatory Visit: Payer: Self-pay

## 2022-05-18 ENCOUNTER — Encounter (HOSPITAL_COMMUNITY): Payer: Self-pay | Admitting: Pharmacy Technician

## 2022-05-18 ENCOUNTER — Emergency Department (HOSPITAL_COMMUNITY)
Admission: EM | Admit: 2022-05-18 | Discharge: 2022-05-18 | Disposition: A | Discharge status: Home / Self Care | Payer: Medicare HMO | Attending: Emergency Medicine | Admitting: Emergency Medicine

## 2022-05-18 DIAGNOSIS — S0993XA Unspecified injury of face, initial encounter: Secondary | ICD-10-CM | POA: Diagnosis not present

## 2022-05-18 DIAGNOSIS — Z79899 Other long term (current) drug therapy: Secondary | ICD-10-CM | POA: Insufficient documentation

## 2022-05-18 DIAGNOSIS — S01511A Laceration without foreign body of lip, initial encounter: Secondary | ICD-10-CM | POA: Insufficient documentation

## 2022-05-18 DIAGNOSIS — I10 Essential (primary) hypertension: Secondary | ICD-10-CM | POA: Diagnosis not present

## 2022-05-18 DIAGNOSIS — W01198A Fall on same level from slipping, tripping and stumbling with subsequent striking against other object, initial encounter: Secondary | ICD-10-CM | POA: Insufficient documentation

## 2022-05-18 DIAGNOSIS — Z7901 Long term (current) use of anticoagulants: Secondary | ICD-10-CM | POA: Diagnosis not present

## 2022-05-18 DIAGNOSIS — R609 Edema, unspecified: Secondary | ICD-10-CM | POA: Diagnosis not present

## 2022-05-18 DIAGNOSIS — Z7984 Long term (current) use of oral hypoglycemic drugs: Secondary | ICD-10-CM | POA: Diagnosis not present

## 2022-05-18 DIAGNOSIS — Y9301 Activity, walking, marching and hiking: Secondary | ICD-10-CM | POA: Insufficient documentation

## 2022-05-18 DIAGNOSIS — W19XXXA Unspecified fall, initial encounter: Secondary | ICD-10-CM

## 2022-05-18 DIAGNOSIS — Z743 Need for continuous supervision: Secondary | ICD-10-CM | POA: Diagnosis not present

## 2022-05-18 DIAGNOSIS — S0083XA Contusion of other part of head, initial encounter: Secondary | ICD-10-CM

## 2022-05-18 DIAGNOSIS — S0990XA Unspecified injury of head, initial encounter: Secondary | ICD-10-CM | POA: Diagnosis not present

## 2022-05-18 DIAGNOSIS — R58 Hemorrhage, not elsewhere classified: Secondary | ICD-10-CM | POA: Diagnosis not present

## 2022-05-18 MED ORDER — CHLORHEXIDINE GLUCONATE 0.12 % MT SOLN: 15.0000 mL | Freq: Two times a day (BID) | OROMUCOSAL | 0 refills | Status: DC

## 2022-05-18 MED ORDER — NEOSPORIN LIP HEALTH OVERNIGHT EX OINT: 1.0000 "application " | TOPICAL_OINTMENT | Freq: Two times a day (BID) | CUTANEOUS | 0 refills | Status: DC

## 2022-05-18 NOTE — Discharge Instructions (Addendum)
1.  Swish the chlorhexidine solution in your mouth and over your lip twice a day.  This will clean the lip.  You may also rinse with plain water 3-6 times per day.  Apply the Neosporin lip ointment 2-3 times daily. 2.  Follow head injury instructions. 3.  Return to the emergency department if you have new worsening or concerning symptoms.

## 2022-05-18 NOTE — ED Provider Notes (Signed)
[facility] EMERGENCY DEPARTMENT Provider Note   CSN: [id number] Arrival date & time: 05/18/22  1050     History  Chief Complaint  Patient presents with   Bradley Alvarez is a 81 y.o. male.  HPI Patient reports that he was walking outside and ended up getting his feet caught stumbling and falling face forward.  He reports the brunt of the hit was directly to his nose and lip area.  He did hit a cement pavement.  Patient denies he was knocked out.  He denies he had any confusion nausea or vomiting.  Is able to get back up.  He denies he has other pain in his neck shoulders chest hips or legs.  Patient does take Eliquis.    Home Medications Prior to Admission medications   Medication Sig Start Date End Date Taking? Authorizing Provider  chlorhexidine (PERIDEX) 0.12 % solution Use as directed 15 mLs in the mouth or throat 2 (two) times daily. 05/18/22  Yes Charlesetta Shanks, MD  Skin Protectants, Misc. (NEOSPORIN LIP HEALTH OVERNIGHT) OINT Apply 1 application  topically 2 (two) times daily. 05/18/22  Yes Charlesetta Shanks, MD  apixaban (ELIQUIS) 5 MG TABS tablet Take 2 tablets ('10mg'$ ) twice daily for 7 days, then 1 tablet ('5mg'$ ) twice daily 07/16/21   [place] Eric J, DO  BIDIL 20-37.5 MG tablet Take 2 tablets by mouth 3 (three) times daily. 09/11/21   Patwardhan, Reynold Bowen, MD  carvedilol (COREG) 25 MG tablet TAKE TWO TABLETS BY MOUTH TWICE DAILY with meals 05/01/22   Cantwell, Celeste C, PA-C  clobetasol ointment (TEMOVATE) 1.88 % Apply 1 application topically daily as needed (for itching).  09/23/13   [provider]  Cyanocobalamin 2500 MCG TABS Take 500 mcg by mouth daily.     [provider]  diclofenac sodium (VOLTAREN) 1 % GEL Apply 1 application topically 4 (four) times daily as needed (For pain.).    [provider]  Dulaglutide 1.5 MG/0.5ML SOPN Inject 1 Dose into the skin once a week. Wednesday's    [provider]  felodipine  (PLENDIL) 5 MG 24 hr tablet TAKE TWO TABLETS BY MOUTH DAILY 12/24/21   Adrian Prows, MD  furosemide (LASIX) 20 MG tablet TAKE ONE TABLET BY MOUTH ONE TIME DAILY *TAKE 1 TAB TWICE DAILY FOR 3-5 DAYS IF INCREASED LEG SWELLING OR SHORTNESS OF BREATH* Patient taking differently: 40 mg. Patient takes 40 mg daily.  Take additional 20 mg 3-5 days if swelling per patient. 04/15/21   Patwardhan, Reynold Bowen, MD  HYDROcodone-acetaminophen (NORCO/VICODIN) 5-325 MG tablet 2 (two) times daily. 01/06/19   [provider]  loratadine (CLARITIN) 10 MG tablet Take 10 mg by mouth every morning.     [provider]  losartan (COZAAR) 100 MG tablet Take 100 mg by mouth every evening.  07/27/17   [provider]  MEGARED OMEGA-3 KRILL OIL PO Take 1 tablet by mouth daily.    [provider]  metFORMIN (GLUCOPHAGE) 1000 MG tablet Take 1,000 mg by mouth daily with breakfast.    [provider]  Multiple Vitamin (MULTIVITAMIN WITH MINERALS) TABS tablet Take 1 tablet by mouth daily.    [provider]  Roma Schanz test strip USE TO TEST BID AS DIRECTED 03/20/16   [provider]  Polyethyl Glycol-Propyl Glycol (SYSTANE OP) Apply to eye as needed.    [provider]  potassium chloride SA (K-DUR) 20 MEQ tablet  03/24/19  [provider]  simvastatin (ZOCOR) 40 MG tablet Take 40 mg by mouth every evening.    [provider]  spironolactone (ALDACTONE) 50 MG tablet Take 1 tablet by mouth daily. 08/24/18   [provider]  Turmeric 400 MG CAPS Take by mouth daily.    [provider]  urea (CARMOL) 10 % cream Apply topically as needed.    [provider]      Allergies    Patient has no known allergies.    Review of Systems   Review of Systems 10 systems reviewed negative except as per HPI Physical Exam Updated Vital Signs BP (!) 181/93   Pulse 64   Temp 97.6 F (36.4 C)   Resp (!) 25   Ht '6\' 2"'$  (1.88 m)    Wt 136.1 kg   SpO2 96%   BMI 38.52 kg/m  Physical Exam Constitutional:      Comments: Patient is alert with clear mental status.  No respiratory distress.  Verbally interactive.  HENT:     Head:     Comments: Patient has a superficial abrasion just below the nostril on the right.  No active bleeding.  Approximately 3 mm x 5 mm.  Patient also has a lip laceration upper lip puncture that is within the mucosa and not including the vermilion border.  Approximately 5 mm x 3 mm.  No active bleeding.  Very superficial abrasion to the inner aspect of the lower lip.  No active bleeding.    Nose:     Comments: No deformity of the bridge of the nose.  No clot or blood within the nasal passages.    Mouth/Throat:     Comments: Posterior oropharynx is widely patent.  Patient has upper and lower dentures. Eyes:     Extraocular Movements: Extraocular movements intact.     Conjunctiva/sclera: Conjunctivae normal.     Pupils: Pupils are equal, round, and reactive to light.  Neck:     Comments: No midline C-spine tenderness. Cardiovascular:     Rate and Rhythm: Normal rate. Rhythm irregular.  Pulmonary:     Effort: Pulmonary effort is normal.     Breath sounds: Normal breath sounds.  Abdominal:     General: There is no distension.     Palpations: Abdomen is soft.     Tenderness: There is no abdominal tenderness. There is no guarding.  Musculoskeletal:        General: Normal range of motion.     Comments: No acute deformities or areas of pain.  Skin:    General: Skin is warm and dry.  Neurological:     General: No focal deficit present.     Mental Status: He is oriented to person, place, and time.     Motor: No weakness.     Coordination: Coordination normal.  Psychiatric:        Mood and Affect: Mood normal.     ED Results / Procedures / Treatments   Labs (all labs ordered are listed, but only abnormal results are displayed) Labs Reviewed - No data to display  EKG None  Radiology CT  HEAD WO CONTRAST  Result Date: 05/18/2022 CLINICAL DATA:  Facial trauma.  Fall.  Patient on blood thinners. EXAM: CT HEAD WITHOUT CONTRAST CT MAXILLOFACIAL WITHOUT CONTRAST TECHNIQUE: Multidetector CT imaging of the head and maxillofacial structures were performed using the standard protocol without intravenous contrast. Multiplanar CT image reconstructions of the maxillofacial structures were also generated. RADIATION DOSE REDUCTION: This  exam was performed according to the departmental dose-optimization program which includes automated exposure control, adjustment of the mA and/or kV according to patient size and/or use of iterative reconstruction technique. COMPARISON:  None Available. FINDINGS: CT HEAD FINDINGS Brain: Ventricles, cisterns and other CSF spaces are within normal. No mass, mass effect, shift of midline structures or acute hemorrhage. No evidence of acute infarction. Vascular: No hyperdense vessel or unexpected calcification. Skull: Normal. Negative for fracture or focal lesion. Other: Suggestion of soft tissue contusion over the midline occipital scalp. CT MAXILLOFACIAL FINDINGS Osseous: No acute facial bone fracture.  Patient is edentulous. Orbits: Negative. No traumatic or inflammatory finding. Sinuses: Paranasal sinuses are well developed and essentially clear other than mild opacification over the right sphenoid sinus. No air-fluid levels. Mastoid air cells are clear. Soft tissues: No significant focal soft tissue injury. IMPRESSION: 1. No acute brain injury. 2. Suggestion of soft tissue contusion over the midline occipital scalp. 3. No acute facial bone fracture. 4. Minimal chronic inflammatory change of the right sphenoid sinus. Electronically Signed   By: Marin Olp M.D.   On: 05/18/2022 11:49   CT MAXILLOFACIAL WO CONTRAST  Result Date: 05/18/2022 CLINICAL DATA:  Facial trauma.  Fall.  Patient on blood thinners. EXAM: CT HEAD WITHOUT CONTRAST CT MAXILLOFACIAL WITHOUT CONTRAST  TECHNIQUE: Multidetector CT imaging of the head and maxillofacial structures were performed using the standard protocol without intravenous contrast. Multiplanar CT image reconstructions of the maxillofacial structures were also generated. RADIATION DOSE REDUCTION: This exam was performed according to the departmental dose-optimization program which includes automated exposure control, adjustment of the mA and/or kV according to patient size and/or use of iterative reconstruction technique. COMPARISON:  None Available. FINDINGS: CT HEAD FINDINGS Brain: Ventricles, cisterns and other CSF spaces are within normal. No mass, mass effect, shift of midline structures or acute hemorrhage. No evidence of acute infarction. Vascular: No hyperdense vessel or unexpected calcification. Skull: Normal. Negative for fracture or focal lesion. Other: Suggestion of soft tissue contusion over the midline occipital scalp. CT MAXILLOFACIAL FINDINGS Osseous: No acute facial bone fracture.  Patient is edentulous. Orbits: Negative. No traumatic or inflammatory finding. Sinuses: Paranasal sinuses are well developed and essentially clear other than mild opacification over the right sphenoid sinus. No air-fluid levels. Mastoid air cells are clear. Soft tissues: No significant focal soft tissue injury. IMPRESSION: 1. No acute brain injury. 2. Suggestion of soft tissue contusion over the midline occipital scalp. 3. No acute facial bone fracture. 4. Minimal chronic inflammatory change of the right sphenoid sinus. Electronically Signed   By: Marin Olp M.D.   On: 05/18/2022 11:49    Procedures Procedures    Medications Ordered in ED Medications - No data to display  ED Course/ Medical Decision Making/ A&P                           Medical Decision Making Amount and/or Complexity of Data Reviewed Radiology: ordered.  Risk OTC drugs. Prescription drug management.   Patient and mechanical fall.  He does take Eliquis.  He does  have advanced age.  CT scan of the head obtained without any acute abnormalities.  Patient does have some abrasion to the upper lip near the philtrum and a minor puncture wound to the mucosal surface of the upper lip.  Wound is fully examined.  At this time we discussed the benefits of suture versus local wound care with cleaning and antibiotic treatment.  Patient opts for  local wound care with antibiotic ointment.  I feel this is appropriate this is a minor puncture to the mucosal surface outside the vermilion border on the inner lip.  I described the healing process for mouth lacerations on the mucosal surfaces. Patient is anxious for discharge.  Return precautions reviewed.        Final Clinical Impression(s) / ED Diagnoses Final diagnoses:  Fall, initial encounter  Contusion of face, initial encounter  Lip laceration, initial encounter  Minor head injury, initial encounter    Rx / DC Orders ED Discharge Orders          Ordered    Skin Protectants, Misc. (NEOSPORIN LIP HEALTH OVERNIGHT) OINT  2 times daily        05/18/22 1259    chlorhexidine (PERIDEX) 0.12 % solution  2 times daily        05/18/22 1259              Charlesetta Shanks, MD 05/18/22 1311

## 2022-05-18 NOTE — ED Notes (Signed)
Trauma Response Nurse Documentation   Bradley Alvarez is a 81 y.o. male arriving to [facility] ED via [facility]  On Eliquis (apixaban) daily. Trauma was activated as a Level 2 by Roselyn Reef, [place] nurse based on the following trauma criteria Elderly patients > 65 with head trauma on anti-coagulation (excluding ASA). Trauma team at the bedside on patient arrival. Patient cleared for CT by Dr. Johnney Killian. Patient to CT with team. GCS 15.  History   Past Medical History:  Diagnosis Date   AAA (abdominal aortic aneurysm) without rupture ([place])    followed by dr Derek Jack--  per office note dated 11-05-2018  3.5cm   Benign essential HTN    cardiologist-- dr Margart Sickles   Chronic combined systolic (congestive) and diastolic (congestive) heart failure [facility])    cardiologist-- dr Robb Matar   CKD (chronic kidney disease), stage III ([place])    Diabetes mellitus type 2, insulin dependent ([place])    followed by dr Virgina Jock   Diabetic neuropathy ([place])    DOE (dyspnea on exertion)    Edema of both lower extremities    Full dentures    HOH (hard of hearing)    does wear hearing aids   Hydrocele, bilateral    Lichen planus    Mixed hyperlipidemia    Morbid obesity with BMI of 45.0-49.9, adult ([place])    Nocturia    OA (osteoarthritis)    OSA on CPAP 10/14/08   per study  AHI  61.8/hr ,  RDI  96.1/hr.      Past Surgical History:  Procedure Laterality Date   CARDIAC CATHETERIZATION  per pt yrs ago   was told no blockage   CARDIOVASCULAR STRESS TEST  07-19-2014   dr Shelva Majestic   Low risk nuclear study w/ small apical attentuation artifact/  normal LV function and wall motion , ef 53%   CATARACT EXTRACTION W/ INTRAOCULAR LENS  IMPLANT, BILATERAL  2018   COLONOSCOPY W/ BIOPSIES AND POLYPECTOMY     COLONOSCOPY WITH PROPOFOL N/A 05/16/2016   Procedure: COLONOSCOPY WITH PROPOFOL;  Surgeon: Carol Ada, MD;  Location: [facility];  Service: Endoscopy;  Laterality: N/A;   HYDROCELE EXCISION  Bilateral 11/15/2018   Procedure: HYDROCELECTOMY ADULT;  Surgeon: Kathie Rhodes, MD;  Location: [facility];  Service: Urology;  Laterality: Bilateral;   KNEE ARTHROSCOPY Right 2009   MULTIPLE TOOTH EXTRACTIONS     ORIF ANKLE FRACTURE Left 09/13/2014   Procedure: Open Reduction Internal Fixation Left Ankle Medial Malleolus;  Surgeon: Newt Minion, MD;  Location: [place];  Service: Orthopedics;  Laterality: Left;   PROSTATECTOMY  1993 approx.  by dr Diona Fanti   for prostate enlargement   TOTAL KNEE ARTHROPLASTY Right 08/27/2009   dr Noemi Chapel '@[facility]'$    TOTAL KNEE ARTHROPLASTY  12/20/2012   Procedure: TOTAL KNEE ARTHROPLASTY;  Surgeon: Lorn Junes, MD;  Location: [place];  Service: Orthopedics;  Laterality: Left;  left total knee arthroplasty   TRANSTHORACIC ECHOCARDIOGRAM  11-04-2018   dr patwardhan   moderate to severe concentric LVH, ef 56%,  grade 1 diastolic dysfunciton/  mild AV calcification annulus and leaflets without stentosis ,  mild regurg./  moderate LAE/ dilated aortic root, 4.5cm/  mild MV annulus calcification with trace regurg./  trace TR       Initial Focused Assessment (If applicable, or please see trauma documentation): Airway- clear Breathing- spontaneous/unlabored Circulation- abrasions to lips and nose, bleeding conmtrolled Disability-- Alert/Oriented x 4,, GCS 15  CT's Completed:  CT Head and CT Maxillofacial    Consults completed:  none   Event Summary: Pt states that he tripped on a crack in the sidewalk while [place] and landed on his face- abrasions to nose/lips- bleeding is controlled. No LOC.  18G IV started in left [facility] per EMS Denies any other pain or injuries   Bedside handoff with ED RN Donella Stade, RN.    Lezlie Octave Kimmy Parish  Trauma Response RN  Please call TRN at [phone] for further assistance.

## 2022-05-18 NOTE — ED Triage Notes (Signed)
Pt here via ems after mechanical fall, hitting he face on cement. Pt on eliquis. No loc. Bleeding controlled.

## 2022-05-18 NOTE — Progress Notes (Signed)
Orthopedic Tech Progress Note Patient Details:  Bradley Alvarez 12/02/1940 [id number]  Patient ID: Bradley Alvarez, male   DOB: 11-Mar-1941, 81 y.o.   MRN: [id number] Level II; not needed.  Vernona Rieger 05/18/2022, 10:53 AM

## 2022-05-18 NOTE — ED Notes (Signed)
Discharge instructions reviewed with patient. Patient verbalized understanding of instructions. Follow-up care and medications were reviewed. Patient ambulatory with steady gait. VSS upon discharge.  ?

## 2022-05-20 DIAGNOSIS — I5042 Chronic combined systolic (congestive) and diastolic (congestive) heart failure: Secondary | ICD-10-CM | POA: Diagnosis not present

## 2022-05-20 DIAGNOSIS — G4733 Obstructive sleep apnea (adult) (pediatric): Secondary | ICD-10-CM | POA: Diagnosis not present

## 2022-05-20 DIAGNOSIS — S00531A Contusion of lip, initial encounter: Secondary | ICD-10-CM | POA: Diagnosis not present

## 2022-05-20 DIAGNOSIS — E785 Hyperlipidemia, unspecified: Secondary | ICD-10-CM | POA: Diagnosis not present

## 2022-05-20 DIAGNOSIS — R609 Edema, unspecified: Secondary | ICD-10-CM | POA: Diagnosis not present

## 2022-05-20 DIAGNOSIS — K760 Fatty (change of) liver, not elsewhere classified: Secondary | ICD-10-CM | POA: Diagnosis not present

## 2022-05-20 DIAGNOSIS — N1831 Chronic kidney disease, stage 3a: Secondary | ICD-10-CM | POA: Diagnosis not present

## 2022-05-20 DIAGNOSIS — R82998 Other abnormal findings in urine: Secondary | ICD-10-CM | POA: Diagnosis not present

## 2022-05-20 DIAGNOSIS — Z0000 Encounter for general adult medical examination without abnormal findings: Secondary | ICD-10-CM | POA: Diagnosis not present

## 2022-05-20 DIAGNOSIS — I2699 Other pulmonary embolism without acute cor pulmonale: Secondary | ICD-10-CM | POA: Diagnosis not present

## 2022-05-20 DIAGNOSIS — M79641 Pain in right hand: Secondary | ICD-10-CM | POA: Diagnosis not present

## 2022-05-20 DIAGNOSIS — E1129 Type 2 diabetes mellitus with other diabetic kidney complication: Secondary | ICD-10-CM | POA: Diagnosis not present

## 2022-05-20 DIAGNOSIS — H35039 Hypertensive retinopathy, unspecified eye: Secondary | ICD-10-CM | POA: Diagnosis not present

## 2022-05-24 DIAGNOSIS — G4733 Obstructive sleep apnea (adult) (pediatric): Secondary | ICD-10-CM | POA: Diagnosis not present

## 2022-05-26 ENCOUNTER — Ambulatory Visit: Payer: Medicare HMO

## 2022-05-26 ENCOUNTER — Ambulatory Visit (INDEPENDENT_AMBULATORY_CARE_PROVIDER_SITE_OTHER): Payer: Medicare HMO | Admitting: Podiatry

## 2022-05-26 ENCOUNTER — Encounter: Payer: Self-pay | Admitting: Podiatry

## 2022-05-26 DIAGNOSIS — M79675 Pain in left toe(s): Secondary | ICD-10-CM | POA: Diagnosis not present

## 2022-05-26 DIAGNOSIS — E119 Type 2 diabetes mellitus without complications: Secondary | ICD-10-CM

## 2022-05-26 DIAGNOSIS — D689 Coagulation defect, unspecified: Secondary | ICD-10-CM

## 2022-05-26 DIAGNOSIS — M79674 Pain in right toe(s): Secondary | ICD-10-CM

## 2022-05-26 DIAGNOSIS — M2010 Hallux valgus (acquired), unspecified foot: Secondary | ICD-10-CM

## 2022-05-26 DIAGNOSIS — M2140 Flat foot [pes planus] (acquired), unspecified foot: Secondary | ICD-10-CM

## 2022-05-26 DIAGNOSIS — B351 Tinea unguium: Secondary | ICD-10-CM

## 2022-05-26 DIAGNOSIS — N179 Acute kidney failure, unspecified: Secondary | ICD-10-CM

## 2022-05-30 DIAGNOSIS — N1831 Chronic kidney disease, stage 3a: Secondary | ICD-10-CM | POA: Diagnosis not present

## 2022-05-30 DIAGNOSIS — E1129 Type 2 diabetes mellitus with other diabetic kidney complication: Secondary | ICD-10-CM | POA: Diagnosis not present

## 2022-05-30 DIAGNOSIS — I2699 Other pulmonary embolism without acute cor pulmonale: Secondary | ICD-10-CM | POA: Diagnosis not present

## 2022-05-30 DIAGNOSIS — I130 Hypertensive heart and chronic kidney disease with heart failure and stage 1 through stage 4 chronic kidney disease, or unspecified chronic kidney disease: Secondary | ICD-10-CM | POA: Diagnosis not present

## 2022-06-06 DIAGNOSIS — G4733 Obstructive sleep apnea (adult) (pediatric): Secondary | ICD-10-CM | POA: Diagnosis not present

## 2022-06-15 DIAGNOSIS — G4733 Obstructive sleep apnea (adult) (pediatric): Secondary | ICD-10-CM | POA: Diagnosis not present

## 2022-06-15 DIAGNOSIS — I2699 Other pulmonary embolism without acute cor pulmonale: Secondary | ICD-10-CM | POA: Diagnosis not present

## 2022-06-19 DIAGNOSIS — Z23 Encounter for immunization: Secondary | ICD-10-CM | POA: Diagnosis not present

## 2022-06-20 ENCOUNTER — Other Ambulatory Visit: Payer: Self-pay

## 2022-06-23 DIAGNOSIS — G4733 Obstructive sleep apnea (adult) (pediatric): Secondary | ICD-10-CM | POA: Diagnosis not present

## 2022-06-26 ENCOUNTER — Other Ambulatory Visit: Payer: Self-pay

## 2022-06-26 MED ORDER — BIDIL 20-37.5 MG PO TABS: 2.0000 | ORAL_TABLET | Freq: Three times a day (TID) | ORAL | 3 refills | Status: DC

## 2022-06-30 ENCOUNTER — Encounter: Payer: Self-pay | Admitting: Pulmonary Disease

## 2022-06-30 ENCOUNTER — Ambulatory Visit: Payer: Medicare HMO | Admitting: Pulmonary Disease

## 2022-06-30 VITALS — BP 122/74 | HR 65 | Temp 98.1°F | Ht 74.0 in | Wt 312.0 lb

## 2022-06-30 DIAGNOSIS — I2699 Other pulmonary embolism without acute cor pulmonale: Secondary | ICD-10-CM | POA: Diagnosis not present

## 2022-06-30 NOTE — Patient Instructions (Signed)
Nice to see you again  I am glad you are doing well  Continue exercising in the mornings, I think this is great for you  Continue apixaban for now, please let me know if you have any issues with the medications.  If you get short of breath in the future may need to check a blood count to make sure you are not losing blood and we can consider doing pulmonary function tests to see if inhalers would help.  But for now you are doing well, no need for these tests.  Return to clinic in 6 months or sooner if needed with Dr. Silas Flood

## 2022-06-30 NOTE — Progress Notes (Signed)
$'@Patient'I$  ID: Bradley Alvarez, male    DOB: 1941-01-01, 81 y.o.   MRN: [id number]  Chief Complaint  Patient presents with   Follow-up    Doing well    Referring provider: [name], MD  HPI: 81 year old male, former smoker quit 1973 (15-pack-year history).  Past medical history significant for obstructive sleep apnea, hypertension, pulmonary embolism, coronary artery disease, 1 diastolic dysfunction, thoracic aortic aneurysm, type 2 diabetes, hyperlipidemia, morbid obesity.   Doing fine, denies significant DOE.  Continues on Eliquis 5 mg twice daily for presumed PE.  Very expensive, not approved for many fraction assistance has not spent enough out-of-pocket.  Getting samples from PCP as well as cardiology office.  No issues with hematochezia or melena.  Exercising more last 15 days.  Walking in the mornings.  Feels like it helps him feel better.  Imaging:  CXR 07/15/21 showed no acute findings  CTA 07/13/21 no central obstructing PE. Acute wedge shaped consolidation abutting the pleural of the right lower lobe with surrounding ground glass opacity, suspicious for pulmonary infarction   No Known Allergies  Immunization History  Administered Date(s) Administered   Influenza Split 09/02/2010, 08/21/2011, 08/23/2012, 08/19/2013, 08/21/2014   Influenza, Quadrivalent, Recombinant, Inj, Pf 08/24/2018, 07/22/2019, 09/25/2020, 09/09/2021   Influenza,inj,Quad PF,6+ Mos 08/18/2014, 08/09/2015   PFIZER(Purple Top)SARS-COV-2 Vaccination 12/24/2019, 01/13/2020, 09/02/2020, 05/13/2021   Pneumococcal Conjugate-13 09/19/2013   Tdap 07/13/2012   Zoster, Live 03/07/2014    Past Medical History:  Diagnosis Date   AAA (abdominal aortic aneurysm) without rupture ([place])    followed by dr Derek Jack--  per office note dated 11-05-2018  3.5cm   Benign essential HTN    cardiologist-- dr Margart Sickles   Chronic combined systolic (congestive) and diastolic (congestive) heart failure [facility])    cardiologist--  dr Robb Matar   CKD (chronic kidney disease), stage III ([place])    Diabetes mellitus type 2, insulin dependent ([place])    followed by dr Virgina Jock   Diabetic neuropathy ([place])    DOE (dyspnea on exertion)    Edema of both lower extremities    Full dentures    HOH (hard of hearing)    does wear hearing aids   Hydrocele, bilateral    Lichen planus    Mixed hyperlipidemia    Morbid obesity with BMI of 45.0-49.9, adult ([place])    Nocturia    OA (osteoarthritis)    OSA on CPAP 10/14/08   per study  AHI  61.8/hr ,  RDI  96.1/hr.     Tobacco History: Social History   Tobacco Use  Smoking Status Former   Packs/day: 1.50   Years: 10.00   Total pack years: 15.00   Types: Cigarettes   Quit date: 12/14/1971   Years since quitting: 50.5  Smokeless Tobacco Former   Types: Snuff, Chew   Counseling given: Not Answered   Outpatient Medications Prior to Visit  Medication Sig Dispense Refill   apixaban (ELIQUIS) 5 MG TABS tablet Take 2 tablets ('10mg'$ ) twice daily for 7 days, then 1 tablet ('5mg'$ ) twice daily 60 tablet 0   BIDIL 20-37.5 MG tablet Take 2 tablets by mouth 3 (three) times daily. 540 tablet 3   carvedilol (COREG) 25 MG tablet TAKE TWO TABLETS BY MOUTH TWICE DAILY with meals 360 tablet 3   clobetasol ointment (TEMOVATE) 8.65 % Apply 1 application topically daily as needed (for itching).      Cyanocobalamin 2500 MCG TABS Take 500 mcg by mouth daily.      diclofenac sodium (  VOLTAREN) 1 % GEL Apply 1 application topically 4 (four) times daily as needed (For pain.).     Dulaglutide 1.5 MG/0.5ML SOPN Inject 1 Dose into the skin once a week. Wednesday's     felodipine (PLENDIL) 5 MG 24 hr tablet TAKE TWO TABLETS BY MOUTH DAILY 180 tablet 0   furosemide (LASIX) 20 MG tablet TAKE ONE TABLET BY MOUTH ONE TIME DAILY *TAKE 1 TAB TWICE DAILY FOR 3-5 DAYS IF INCREASED LEG SWELLING OR SHORTNESS OF BREATH* (Patient taking differently: 40 mg. Patient takes 40 mg daily.  Take additional 20 mg 3-5 days if  swelling per patient.) 90 tablet 0   HYDROcodone-acetaminophen (NORCO/VICODIN) 5-325 MG tablet 2 (two) times daily.     loratadine (CLARITIN) 10 MG tablet Take 10 mg by mouth every morning.      losartan (COZAAR) 100 MG tablet Take 100 mg by mouth every evening.      MEGARED OMEGA-3 KRILL OIL PO Take 1 tablet by mouth daily.     metFORMIN (GLUCOPHAGE) 1000 MG tablet Take 1,000 mg by mouth daily with breakfast.     Multiple Vitamin (MULTIVITAMIN WITH MINERALS) TABS tablet Take 1 tablet by mouth daily.     ONETOUCH VERIO test strip USE TO TEST BID AS DIRECTED  2   Polyethyl Glycol-Propyl Glycol (SYSTANE OP) Apply to eye as needed.     potassium chloride SA (K-DUR) 20 MEQ tablet      simvastatin (ZOCOR) 40 MG tablet Take 40 mg by mouth every evening.     Skin Protectants, Misc. (NEOSPORIN LIP HEALTH OVERNIGHT) OINT Apply 1 application  topically 2 (two) times daily. 7.7 g 0   spironolactone (ALDACTONE) 50 MG tablet Take 1 tablet by mouth daily.     Turmeric 400 MG CAPS Take by mouth daily.     urea (CARMOL) 10 % cream Apply topically as needed.     chlorhexidine (PERIDEX) 0.12 % solution Use as directed 15 mLs in the mouth or throat 2 (two) times daily. (Patient not taking: Reported on 06/30/2022) 120 mL 0   No facility-administered medications prior to visit.      Review of Systems  Review of Systems N/a  Physical Exam  BP 122/74 (BP Location: Left Arm, Patient Position: Sitting, Cuff Size: Normal)   Pulse 65   Temp 98.1 F (36.7 C) (Oral)   Ht '6\' 2"'$  (1.88 m)   Wt (!) 312 lb (141.5 kg)   SpO2 97%   BMI 40.06 kg/m  Physical Exam  Neuro: Well-appearing, no acute distress Eyes: EOMI, no icterus Neck: Supple, no JVP appreciated Pulmonary: Clear, normal work of breathing Cardiovascular: Regular rate and rhythm, no murmurs   Lab Results: Personally reviewed CBC    Component Value Date/Time   WBC 5.7 07/16/2021 0435   RBC 4.29 07/16/2021 0435   HGB 12.0 (L) 07/16/2021 0435    HGB 14.1 09/10/2017 1056   HCT 38.0 (L) 07/16/2021 0435   HCT 42.3 09/10/2017 1056   PLT 181 07/16/2021 0435   PLT 141 (L) 09/10/2017 1056   MCV 88.6 07/16/2021 0435   MCV 81 09/10/2017 1056   MCH 28.0 07/16/2021 0435   MCHC 31.6 07/16/2021 0435   RDW 14.3 07/16/2021 0435   RDW 15.1 09/10/2017 1056   LYMPHSABS 1.6 07/13/2021 1339   MONOABS 1.7 (H) 07/13/2021 1339   EOSABS 0.1 07/13/2021 1339   BASOSABS 0.0 07/13/2021 1339    BMET    Component Value Date/Time   NA 134 (L) 07/14/2021  0122   NA 140 11/09/2020 0806   K 3.5 07/14/2021 0122   CL 97 (L) 07/14/2021 0122   CO2 28 07/14/2021 0122   GLUCOSE 152 (H) 07/14/2021 0122   BUN 17 07/14/2021 0122   BUN 16 11/09/2020 0806   CREATININE 1.29 (H) 07/14/2021 0122   CREATININE 1.14 03/03/2017 1028   CALCIUM 8.4 (L) 07/14/2021 0122   GFRNONAA 56 (L) 07/14/2021 0122   GFRAA 57 (L) 11/09/2020 0806    BNP    Component Value Date/Time   BNP 15.9 07/13/2021 1339    ProBNP No results found for: "PROBNP"  Imaging: Personally reviewed and interpreted as CTA 07/13/2021 with no PE seen right lower lobe wedge-shaped infiltrate concerning for infarct Nuclear medicine perfusion scan 07/15/2021 without evidence of pulmonary embolus  Assessment & Plan:   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   Presumed related to blood clot.  Discussed risk and benefits of ongoing anticoagulation.  Given unprovoked nature would recommend lifelong which he understands.  Continue apixaban 5 mg twice daily.  OSA on CPAP: continue CPAP, oxygen bleed in.  He reports good adherence.     Lanier Clam, MD 06/30/2022

## 2022-07-07 DIAGNOSIS — G4733 Obstructive sleep apnea (adult) (pediatric): Secondary | ICD-10-CM | POA: Diagnosis not present

## 2022-07-16 DIAGNOSIS — I2699 Other pulmonary embolism without acute cor pulmonale: Secondary | ICD-10-CM | POA: Diagnosis not present

## 2022-07-16 DIAGNOSIS — G4733 Obstructive sleep apnea (adult) (pediatric): Secondary | ICD-10-CM | POA: Diagnosis not present

## 2022-07-16 NOTE — Progress Notes (Signed)
Telephone visit  Subjective:   Bradley Alvarez, male    DOB: 1941/01/16, 81 y.o.   MRN: [id number]   HPI   Chief Complaint  Patient presents with   Thoracic aortic aneurysm without rupture, unspecified part   Hypertension   Follow-up    6 month    81 y.o. African-American male  with hypertension, type 2 DM, TAA, OSA, CKD  Patient is doing well. He is walking regularly without any complaints of chest pain or shortness of breath. Blood pressure has improved. He has annual follow up with Dr. Cyndia Bent for monitoring of TAA.     Current Outpatient Medications:    apixaban (ELIQUIS) 5 MG TABS tablet, Take 2 tablets (26m) twice daily for 7 days, then 1 tablet (551m twice daily, Disp: 60 tablet, Rfl: 0   BIDIL 20-37.5 MG tablet, Take 2 tablets by mouth 3 (three) times daily., Disp: 540 tablet, Rfl: 3   carvedilol (COREG) 25 MG tablet, TAKE TWO TABLETS BY MOUTH TWICE DAILY with meals, Disp: 360 tablet, Rfl: 3   chlorhexidine (PERIDEX) 0.12 % solution, Use as directed 15 mLs in the mouth or throat 2 (two) times daily. (Patient not taking: Reported on 06/30/2022), Disp: 120 mL, Rfl: 0   clobetasol ointment (TEMOVATE) 0.5.85, Apply 1 application topically daily as needed (for itching). , Disp: , Rfl:    Cyanocobalamin 2500 MCG TABS, Take 500 mcg by mouth daily. , Disp: , Rfl:    diclofenac sodium (VOLTAREN) 1 % GEL, Apply 1 application topically 4 (four) times daily as needed (For pain.)., Disp: , Rfl:    Dulaglutide 1.5 MG/0.5ML SOPN, Inject 1 Dose into the skin once a week. Wednesday's, Disp: , Rfl:    felodipine (PLENDIL) 5 MG 24 hr tablet, TAKE TWO TABLETS BY MOUTH DAILY, Disp: 180 tablet, Rfl: 0   furosemide (LASIX) 20 MG tablet, TAKE ONE TABLET BY MOUTH ONE TIME DAILY *TAKE 1 TAB TWICE DAILY FOR 3-5 DAYS IF INCREASED LEG SWELLING OR SHORTNESS OF BREATH* (Patient taking differently: 40 mg. Patient takes 40 mg daily.  Take additional 20 mg 3-5 days if swelling per patient.), Disp: 90 tablet,  Rfl: 0   HYDROcodone-acetaminophen (NORCO/VICODIN) 5-325 MG tablet, 2 (two) times daily., Disp: , Rfl:    loratadine (CLARITIN) 10 MG tablet, Take 10 mg by mouth every morning. , Disp: , Rfl:    losartan (COZAAR) 100 MG tablet, Take 100 mg by mouth every evening. , Disp: , Rfl:    MEGARED OMEGA-3 KRILL OIL PO, Take 1 tablet by mouth daily., Disp: , Rfl:    metFORMIN (GLUCOPHAGE) 1000 MG tablet, Take 1,000 mg by mouth daily with breakfast., Disp: , Rfl:    Multiple Vitamin (MULTIVITAMIN WITH MINERALS) TABS tablet, Take 1 tablet by mouth daily., Disp: , Rfl:    ONETOUCH VERIO test strip, USE TO TEST BID AS DIRECTED, Disp: , Rfl: 2   Polyethyl Glycol-Propyl Glycol (SYSTANE OP), Apply to eye as needed., Disp: , Rfl:    potassium chloride SA (K-DUR) 20 MEQ tablet, , Disp: , Rfl:    simvastatin (ZOCOR) 40 MG tablet, Take 40 mg by mouth every evening., Disp: , Rfl:    Skin Protectants, Misc. (NEOSPORIN LIP HEALTH OVERNIGHT) OINT, Apply 1 application  topically 2 (two) times daily., Disp: 7.7 g, Rfl: 0   spironolactone (ALDACTONE) 50 MG tablet, Take 1 tablet by mouth daily., Disp: , Rfl:    Turmeric 400 MG CAPS, Take by mouth daily., Disp: , Rfl:  urea (CARMOL) 10 % cream, Apply topically as needed., Disp: , Rfl:   Cardiovascular & other pertient studies:  CTA chest 02/26/2022: There is no evidence of thoracic aortic dissection. There is ectasia of ascending thoracic aorta measuring 4.7 cm in diameter. There is no evidence of pulmonary artery embolism. There is ectasia of main pulmonary artery suggesting pulmonary arterial hypertension.   There are linear densities in the lower lung fields suggesting subsegmental atelectasis. There is no new focal pulmonary consolidation. There is no pleural effusion or pneumothorax.   Gallbladder stones.  Possible renal cysts.  Echocardiogram 01/09/2022:  Normal LV systolic function with visual EF 55-60%. Left ventricle cavity  is normal in size. Moderate to  severe  left ventricular hypertrophy.  Normal global wall motion. Doppler evidence of grade II diastolic  dysfunction, normal LAP.  Left atrial cavity is severely dilated.  Trace aortic regurgitation.  Aortic root dilated (Sinus of valsalva 4.5cm). Proximal ascending aorta  dilated 4.2cm.  Compared to study 09/21/2020 LVEF remains preserved, Mild MR is now  resolved, Mild AR is now trace, proximal ascending is dilated 60m (prior  not well visualized).   CTA chest (PE protocol) 07/13/2021: 1. No central obstructing pulmonary embolism identified. 2. However, there is an acute wedge-shaped consolidation abutting the pleura of the RIGHT lower lobe with surrounding groundglass opacity, suspicious for pulmonary infarct. No pulmonary embolism identified, but this pulmonary finding is suspicious for occult PE within a peripheral subsegmental branch to the RIGHT lower lobe. 3. Additional patchy consolidations at the bilateral lung bases, atelectasis versus multifocal pneumonia. 4. Cholelithiasis without evidence of acute cholecystitis.  EKG 05/02/2021: Sinus rhythm 60 bpm  Leftward axis Borderline first degree AV block Low voltage in precordial leads Poor R-wave progression  CTA Chest 01/15/2021: 1. 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 Enlargement of the descending thoracic aorta measuring up to 3.3 x 3.2 cm. Ascending thoracic aortic aneurysm. Recommend semi-annual imaging followup by CTA or MRA and referral to cardiothoracic surgery if not already obtained. This recommendation follows 2010 ACCF/AHA/AATS/ACR/ASA/SCA/SCAI/SIR/STS/SVM Guidelines for the Diagnosis and Management of Patients With Thoracic Aortic Disease. Circulation. 2010; 121:: B284-X324 Aortic aneurysm NOS (ICD10-I71.9) 2. Elevation of the right hemidiaphragm with associated scarring or atelectasis of the right lower and middle lobes. 3. Coronary artery disease. 4. Enlargement  of the main pulmonary artery as can be seen in pulmonary hypertension. 5. Cholelithiasis.   Aortic Atherosclerosis (ICD10-I70.0).  EKG 11/01/2020: Sinus rhythm 68 bpm Old anterior infarct Poor R-wave progression  Echocardiogram 09/21/2020:  1. Normal LV systolic function with visual EF 50-55%. Left ventricle  cavity is normal in size. Severe left ventricular hypertrophy. Normal  global wall motion. Doppler evidence of grade II (pseudonormal) diastolic  dysfunction, elevated LAP.  2. Left atrial cavity is moderately dilated.  3. Mild (Grade I) mitral regurgitation.  4. Mild (Grade I) aortic regurgitation.  5. Insignificant pericardial effusion.  6. The aortic root is dilated, Sinotubular junction 4.6cm. Proximal  ascending aorta not will visualized.  7. Compared to prior study dated 09/09/2019: No significant changes noted.  Abdominal Aortic Duplex 09/21/2020:  Moderate dilatation of the abdominal aorta is noted in the mid and distal  aorta. Abdominal aortic aneurysm observed. An abdominal aortic aneurysm  measuring 3.66 x 3.62 x 3.61 cm is seen.  Normal iliac artery velocity right and mildly dampened left iliac artery  velocity and may suggest suggest <50% stenosis.  No significant change from 09/19/2019. Recheck in 3 years.  Recent labs: 11/09/2020: Glucose 99, BUN/Cr 16/1.36. EGFR 57. Na/K 140/4.5.   04/24/2020: Glucose 92, BUN/Cr 10/1.3. EGFR 64. Na 141. Rest of the CMP normal H/H 13/42.  HbA1C 5.5 % Chol 134, TG 125, HDL 47, LDL 62 TSH 2.57 normal   Review of Systems  Cardiovascular:  Positive for dyspnea on exertion. Negative for chest pain, leg swelling, palpitations and syncope.       Vitals:   07/17/22 0858 07/17/22 0920  BP: (!) 146/78 132/82  Pulse: 65   Resp: 17   Temp: 98 F (36.7 C)   SpO2: 90%       Body mass index is 40.06 kg/m. Filed Weights   07/17/22 0858  Weight: (!) 312 lb (141.5 kg)     Objective:   Physical Exam Vitals and  nursing note reviewed.  Constitutional:      General: He is not in acute distress. Neck:     Vascular: No JVD.  Cardiovascular:     Rate and Rhythm: Normal rate and regular rhythm.     Pulses: Intact distal pulses.          Carotid pulses are 2+ on the right side and 2+ on the left side.      Dorsalis pedis pulses are 2+ on the right side and 2+ on the left side.       Posterior tibial pulses are 2+ on the right side and 2+ on the left side.     Heart sounds: Normal heart sounds. No murmur heard.    No gallop.  Pulmonary:     Effort: Pulmonary effort is normal.     Breath sounds: Normal breath sounds. No wheezing or rales.  Neurological:     Cranial Nerves: No cranial nerve deficit.      ICD-10-CM   1. Essential hypertension  I10     2. Thoracic aortic aneurysm without rupture, unspecified part ([place])  I71.20             Assessment & Recommendations:   81 y.o. African-American male  with hypertension, type 2 DM, TAA, OSA, CKD  Primary hypertension: Better controlled.  TAA: Stable around 4.5-4.7 cm.  Annual imaging and f/u w/Dr. Cyndia Bent.  Continue losartan, carvedilol  Coronary artery disease: Seen on CTA No angina symptoms at this time. In presence of eliquis for PE treatment, I have stopped his Aspirin to redue bleeding risk. LDL controlled on simvastatin 40 mg. Continue the same.  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 Repeat aorta duplex in 2023. Will order ay next visit in 2024.  F/u in 06/2023  Nigel Mormon, MD [facility] Pager: [phone] Office: [phone]

## 2022-07-17 ENCOUNTER — Ambulatory Visit: Payer: Medicare HMO | Admitting: Cardiology

## 2022-07-17 ENCOUNTER — Encounter: Payer: Self-pay | Admitting: Cardiology

## 2022-07-17 VITALS — BP 132/82 | HR 65 | Temp 98.0°F | Resp 17 | Ht 74.0 in | Wt 312.0 lb

## 2022-07-17 DIAGNOSIS — I714 Abdominal aortic aneurysm, without rupture, unspecified: Secondary | ICD-10-CM

## 2022-07-17 DIAGNOSIS — I10 Essential (primary) hypertension: Secondary | ICD-10-CM | POA: Diagnosis not present

## 2022-07-17 DIAGNOSIS — G4733 Obstructive sleep apnea (adult) (pediatric): Secondary | ICD-10-CM | POA: Diagnosis not present

## 2022-07-17 DIAGNOSIS — I712 Thoracic aortic aneurysm, without rupture, unspecified: Secondary | ICD-10-CM

## 2022-07-17 DIAGNOSIS — I2510 Atherosclerotic heart disease of native coronary artery without angina pectoris: Secondary | ICD-10-CM

## 2022-07-18 ENCOUNTER — Ambulatory Visit: Payer: Medicare HMO | Admitting: Cardiology

## 2022-07-20 ENCOUNTER — Encounter: Payer: Self-pay | Admitting: Cardiology

## 2022-07-25 ENCOUNTER — Telehealth: Payer: Self-pay | Admitting: Podiatry

## 2022-07-25 NOTE — Telephone Encounter (Signed)
Left message to advise patient that his shoes are in transit

## 2022-08-07 DIAGNOSIS — G4733 Obstructive sleep apnea (adult) (pediatric): Secondary | ICD-10-CM | POA: Diagnosis not present

## 2022-08-16 DIAGNOSIS — I2699 Other pulmonary embolism without acute cor pulmonale: Secondary | ICD-10-CM | POA: Diagnosis not present

## 2022-08-16 DIAGNOSIS — G4733 Obstructive sleep apnea (adult) (pediatric): Secondary | ICD-10-CM | POA: Diagnosis not present

## 2022-08-18 ENCOUNTER — Encounter: Payer: Self-pay | Admitting: Podiatry

## 2022-08-18 ENCOUNTER — Ambulatory Visit: Payer: Medicare HMO | Admitting: Podiatry

## 2022-08-18 DIAGNOSIS — E119 Type 2 diabetes mellitus without complications: Secondary | ICD-10-CM | POA: Diagnosis not present

## 2022-08-18 DIAGNOSIS — M79674 Pain in right toe(s): Secondary | ICD-10-CM

## 2022-08-18 DIAGNOSIS — M2010 Hallux valgus (acquired), unspecified foot: Secondary | ICD-10-CM

## 2022-08-18 DIAGNOSIS — B351 Tinea unguium: Secondary | ICD-10-CM | POA: Diagnosis not present

## 2022-08-18 DIAGNOSIS — M2140 Flat foot [pes planus] (acquired), unspecified foot: Secondary | ICD-10-CM

## 2022-08-18 DIAGNOSIS — D689 Coagulation defect, unspecified: Secondary | ICD-10-CM | POA: Diagnosis not present

## 2022-08-18 DIAGNOSIS — M79675 Pain in left toe(s): Secondary | ICD-10-CM

## 2022-08-18 NOTE — Progress Notes (Signed)
This patient returns to my office for at risk foot care.  This patient requires this care by a professional since this patient will be at risk due to having  Diabetes.  This patient is unable to cut nails himself since the patient cannot reach his nails.These nails are painful walking and wearing shoes. Patient requests diabetic shoes. This patient presents for at risk foot care today.  General Appearance  Alert, conversant and in no acute stress.  Vascular  Dorsalis pedis  are  weakly  palpable  bilaterally.  Posterior tibial pulses are absent due to ankle swelling.  Absent pedal hair.Capillary return is within normal limits  bilaterally. Cold feet bilaterally.  Neurologic  Senn-Weinstein monofilament wire test diminished  bilaterally. Muscle power within normal limits bilaterally.  Nails Thick disfigured discolored nails with subungual debris  from hallux to fifth toes bilaterally. No evidence of bacterial infection or drainage bilaterally.  Orthopedic  No limitations of motion  feet .  No crepitus or effusions noted.  No bony pathology or digital deformities noted. HAV  B/L.  PTT tendinitis/arthritis left foot.  Pes planus  Skin  normotropic skin with no porokeratosis noted bilaterally.  No signs of infections or ulcers noted.     Onychomycosis  Pain in right toes  Pain in left toes  Consent was obtained for treatment procedures.   Mechanical debridement of nails 1-5  bilaterally performed with a nail nipper.  Filed with dremel without incident. Patientawaiting shoes.   Return office visit   3 months                   Told patient to return for periodic foot care and evaluation due to potential at risk complications.   Gardiner Barefoot DPM

## 2022-09-06 DIAGNOSIS — G4733 Obstructive sleep apnea (adult) (pediatric): Secondary | ICD-10-CM | POA: Diagnosis not present

## 2022-09-15 DIAGNOSIS — G4733 Obstructive sleep apnea (adult) (pediatric): Secondary | ICD-10-CM | POA: Diagnosis not present

## 2022-09-15 DIAGNOSIS — I2699 Other pulmonary embolism without acute cor pulmonale: Secondary | ICD-10-CM | POA: Diagnosis not present

## 2022-09-18 DIAGNOSIS — I2699 Other pulmonary embolism without acute cor pulmonale: Secondary | ICD-10-CM | POA: Diagnosis not present

## 2022-09-18 DIAGNOSIS — Z23 Encounter for immunization: Secondary | ICD-10-CM | POA: Diagnosis not present

## 2022-09-18 DIAGNOSIS — I5030 Unspecified diastolic (congestive) heart failure: Secondary | ICD-10-CM | POA: Diagnosis not present

## 2022-09-18 DIAGNOSIS — I714 Abdominal aortic aneurysm, without rupture, unspecified: Secondary | ICD-10-CM | POA: Diagnosis not present

## 2022-09-18 DIAGNOSIS — H3530 Unspecified macular degeneration: Secondary | ICD-10-CM | POA: Diagnosis not present

## 2022-09-18 DIAGNOSIS — D696 Thrombocytopenia, unspecified: Secondary | ICD-10-CM | POA: Diagnosis not present

## 2022-09-18 DIAGNOSIS — E1129 Type 2 diabetes mellitus with other diabetic kidney complication: Secondary | ICD-10-CM | POA: Diagnosis not present

## 2022-09-18 DIAGNOSIS — N1831 Chronic kidney disease, stage 3a: Secondary | ICD-10-CM | POA: Diagnosis not present

## 2022-09-18 DIAGNOSIS — I712 Thoracic aortic aneurysm, without rupture, unspecified: Secondary | ICD-10-CM | POA: Diagnosis not present

## 2022-09-18 DIAGNOSIS — I5042 Chronic combined systolic (congestive) and diastolic (congestive) heart failure: Secondary | ICD-10-CM | POA: Diagnosis not present

## 2022-09-18 DIAGNOSIS — I130 Hypertensive heart and chronic kidney disease with heart failure and stage 1 through stage 4 chronic kidney disease, or unspecified chronic kidney disease: Secondary | ICD-10-CM | POA: Diagnosis not present

## 2022-10-07 DIAGNOSIS — G4733 Obstructive sleep apnea (adult) (pediatric): Secondary | ICD-10-CM | POA: Diagnosis not present

## 2022-10-16 DIAGNOSIS — I2699 Other pulmonary embolism without acute cor pulmonale: Secondary | ICD-10-CM | POA: Diagnosis not present

## 2022-10-16 DIAGNOSIS — G4733 Obstructive sleep apnea (adult) (pediatric): Secondary | ICD-10-CM | POA: Diagnosis not present

## 2022-10-24 DIAGNOSIS — R0602 Shortness of breath: Secondary | ICD-10-CM | POA: Diagnosis not present

## 2022-10-24 DIAGNOSIS — G4733 Obstructive sleep apnea (adult) (pediatric): Secondary | ICD-10-CM | POA: Diagnosis not present

## 2022-10-24 DIAGNOSIS — I130 Hypertensive heart and chronic kidney disease with heart failure and stage 1 through stage 4 chronic kidney disease, or unspecified chronic kidney disease: Secondary | ICD-10-CM | POA: Diagnosis not present

## 2022-10-24 DIAGNOSIS — I5042 Chronic combined systolic (congestive) and diastolic (congestive) heart failure: Secondary | ICD-10-CM | POA: Diagnosis not present

## 2022-10-24 DIAGNOSIS — R300 Dysuria: Secondary | ICD-10-CM | POA: Diagnosis not present

## 2022-10-24 DIAGNOSIS — E1129 Type 2 diabetes mellitus with other diabetic kidney complication: Secondary | ICD-10-CM | POA: Diagnosis not present

## 2022-10-28 DIAGNOSIS — G4733 Obstructive sleep apnea (adult) (pediatric): Secondary | ICD-10-CM | POA: Diagnosis not present

## 2022-11-06 DIAGNOSIS — G4733 Obstructive sleep apnea (adult) (pediatric): Secondary | ICD-10-CM | POA: Diagnosis not present

## 2022-11-15 DIAGNOSIS — I2699 Other pulmonary embolism without acute cor pulmonale: Secondary | ICD-10-CM | POA: Diagnosis not present

## 2022-11-15 DIAGNOSIS — G4733 Obstructive sleep apnea (adult) (pediatric): Secondary | ICD-10-CM | POA: Diagnosis not present

## 2022-11-19 ENCOUNTER — Encounter: Payer: Self-pay | Admitting: Podiatry

## 2022-11-19 ENCOUNTER — Ambulatory Visit (INDEPENDENT_AMBULATORY_CARE_PROVIDER_SITE_OTHER): Payer: Medicare HMO | Admitting: Podiatry

## 2022-11-19 DIAGNOSIS — M79675 Pain in left toe(s): Secondary | ICD-10-CM

## 2022-11-19 DIAGNOSIS — E119 Type 2 diabetes mellitus without complications: Secondary | ICD-10-CM | POA: Diagnosis not present

## 2022-11-19 DIAGNOSIS — B351 Tinea unguium: Secondary | ICD-10-CM

## 2022-11-19 DIAGNOSIS — M79674 Pain in right toe(s): Secondary | ICD-10-CM | POA: Diagnosis not present

## 2022-11-19 DIAGNOSIS — D689 Coagulation defect, unspecified: Secondary | ICD-10-CM | POA: Diagnosis not present

## 2022-11-19 NOTE — Progress Notes (Signed)
This patient returns to my office for at risk foot care.  This patient requires this care by a professional since this patient will be at risk due to having  Diabetes.  This patient is unable to cut nails himself since the patient cannot reach his nails.These nails are painful walking and wearing shoes. Patient requests diabetic shoes. This patient presents for at risk foot care today.  General Appearance  Alert, conversant and in no acute stress.  Vascular  Dorsalis pedis  are  weakly  palpable  bilaterally.  Posterior tibial pulses are absent due to ankle swelling.  Absent pedal hair.Capillary return is within normal limits  bilaterally. Cold feet bilaterally.  Neurologic  Senn-Weinstein monofilament wire test diminished  bilaterally. Muscle power within normal limits bilaterally.  Nails Thick disfigured discolored nails with subungual debris  from hallux to fifth toes bilaterally. No evidence of bacterial infection or drainage bilaterally.  Orthopedic  No limitations of motion  feet .  No crepitus or effusions noted.  No bony pathology or digital deformities noted. HAV  B/L.  PTT tendinitis/arthritis left foot.  Pes planus  Skin  normotropic skin with no porokeratosis noted bilaterally.  No signs of infections or ulcers noted.     Onychomycosis  Pain in right toes  Pain in left toes  Consent was obtained for treatment procedures.   Mechanical debridement of nails 1-5  bilaterally performed with a nail nipper.  Filed with dremel without incident.    Return office visit   3 months                   Told patient to return for periodic foot care and evaluation due to potential at risk complications.   Gardiner Barefoot DPM

## 2022-11-27 DIAGNOSIS — G4733 Obstructive sleep apnea (adult) (pediatric): Secondary | ICD-10-CM | POA: Diagnosis not present

## 2022-11-28 ENCOUNTER — Other Ambulatory Visit: Payer: Self-pay

## 2022-11-28 MED ORDER — APIXABAN 5 MG PO TABS: ORAL_TABLET | ORAL | 3 refills | Status: DC

## 2022-12-07 DIAGNOSIS — G4733 Obstructive sleep apnea (adult) (pediatric): Secondary | ICD-10-CM | POA: Diagnosis not present

## 2022-12-11 ENCOUNTER — Telehealth: Payer: Self-pay

## 2022-12-11 NOTE — Telephone Encounter (Signed)
Pt's spouse calling to check on/discuss pt assistance. May need samples. Please call her @ [phone]

## 2022-12-11 NOTE — Telephone Encounter (Signed)
Refaxed info missing left samples for patient up front

## 2022-12-16 DIAGNOSIS — G4733 Obstructive sleep apnea (adult) (pediatric): Secondary | ICD-10-CM | POA: Diagnosis not present

## 2022-12-16 DIAGNOSIS — I2699 Other pulmonary embolism without acute cor pulmonale: Secondary | ICD-10-CM | POA: Diagnosis not present

## 2022-12-24 ENCOUNTER — Telehealth: Payer: Self-pay

## 2022-12-24 NOTE — Telephone Encounter (Signed)
Spoke with wife concerning status of patient assistance for Eliquis. I spoke with patient assistance all paperwork has been received and is being processed and informed patient's wife that their out of pock deductible has to be met this year before they can receive assistance. Wife requested samples of Eliquis. Samples taken to front desk for pickup. She verbalized understanding.

## 2022-12-28 DIAGNOSIS — G4733 Obstructive sleep apnea (adult) (pediatric): Secondary | ICD-10-CM | POA: Diagnosis not present

## 2022-12-30 ENCOUNTER — Other Ambulatory Visit: Payer: Self-pay | Admitting: Surgery

## 2022-12-30 DIAGNOSIS — I7121 Aneurysm of the ascending aorta, without rupture: Secondary | ICD-10-CM

## 2023-01-06 ENCOUNTER — Encounter: Payer: Self-pay | Admitting: Pulmonary Disease

## 2023-01-06 ENCOUNTER — Ambulatory Visit: Payer: Medicare HMO | Admitting: Pulmonary Disease

## 2023-01-06 VITALS — BP 124/68 | HR 67 | Temp 98.9°F | Wt 314.0 lb

## 2023-01-06 DIAGNOSIS — I2699 Other pulmonary embolism without acute cor pulmonale: Secondary | ICD-10-CM

## 2023-01-06 DIAGNOSIS — G4733 Obstructive sleep apnea (adult) (pediatric): Secondary | ICD-10-CM | POA: Diagnosis not present

## 2023-01-06 NOTE — Progress Notes (Signed)
$'@Patient'i$  ID: Bradley Alvarez, male    DOB: 1940/12/16, 82 y.o.   MRN: [id number]  Chief Complaint  Patient presents with   Follow-up    Pt is here for follow up for pulmonary embolism. Pt states he is doing well since last office visit and has no issues. Pt states he continues eliquis with no issues.,     Referring provider: [name], MD  HPI: 82 y.o., former smoker quit 1973 (15-pack-year history).  Past medical history significant for obstructive sleep apnea, hypertension, pulmonary embolism, coronary artery disease, 1 diastolic dysfunction, thoracic aortic aneurysm, type 2 diabetes, hyperlipidemia, morbid obesity.   Doing fine, denies significant DOE.  Continues on Eliquis 5 mg twice daily for presumed PE.  Remains expensive, getting samples from PCP and the cardiologist office.  Was finally approved for many fraction assistance late in 2023 but never got any assistance given how late in the year it was.  Now undergoing reapproval.  Needs to hit out-of-pocket expenditure.  Once that is done, they are going to get many fraction assistance again.  Hopefully earlier this year.  It seems like it will be much earlier this year.  No issues with bleeding etc.  Imaging:  CXR 07/15/21 showed no acute findings  CTA 07/13/21 no central obstructing PE. Acute wedge shaped consolidation abutting the pleural of the right lower lobe with surrounding ground glass opacity, suspicious for pulmonary infarction   No Known Allergies  Immunization History  Administered Date(s) Administered   Influenza Split 09/02/2010, 08/21/2011, 08/23/2012, 08/19/2013, 08/21/2014   Influenza, Quadrivalent, Recombinant, Inj, Pf 08/24/2018, 07/22/2019, 09/25/2020, 09/09/2021   Influenza,inj,Quad PF,6+ Mos 08/18/2014, 08/09/2015   PFIZER(Purple Top)SARS-COV-2 Vaccination 12/24/2019, 01/13/2020, 09/02/2020, 05/13/2021   Pneumococcal Conjugate-13 09/19/2013   Tdap 07/13/2012   Zoster, Live 03/07/2014    Past Medical  History:  Diagnosis Date   AAA (abdominal aortic aneurysm) without rupture ([place])    followed by dr Derek Jack--  per office note dated 11-05-2018  3.5cm   Benign essential HTN    cardiologist-- dr Margart Sickles   Chronic combined systolic (congestive) and diastolic (congestive) heart failure [facility])    cardiologist-- dr Robb Matar   CKD (chronic kidney disease), stage III ([place])    Diabetes mellitus type 2, insulin dependent ([place])    followed by dr Virgina Jock   Diabetic neuropathy ([place])    DOE (dyspnea on exertion)    Edema of both lower extremities    Full dentures    HOH (hard of hearing)    does wear hearing aids   Hydrocele, bilateral    Lichen planus    Mixed hyperlipidemia    Morbid obesity with BMI of 45.0-49.9, adult ([place])    Nocturia    OA (osteoarthritis)    OSA on CPAP 10/14/08   per study  AHI  61.8/hr ,  RDI  96.1/hr.     Tobacco History: Social History   Tobacco Use  Smoking Status Former   Packs/day: 1.50   Years: 10.00   Total pack years: 15.00   Types: Cigarettes   Quit date: 12/14/1971   Years since quitting: 51.0  Smokeless Tobacco Former   Types: Snuff, Chew   Counseling given: Not Answered   Outpatient Medications Prior to Visit  Medication Sig Dispense Refill   apixaban (ELIQUIS) 5 MG TABS tablet Take 2 tablets ('10mg'$ ) twice daily for 7 days, then 1 tablet ('5mg'$ ) twice daily 180 tablet 3   BIDIL 20-37.5 MG tablet Take 2 tablets by mouth 3 (three) times  daily. 540 tablet 3   carvedilol (COREG) 25 MG tablet TAKE TWO TABLETS BY MOUTH TWICE DAILY with meals (Patient taking differently: Take 25 mg by mouth 2 (two) times daily with a meal. TAKE TWO TABLETS BY MOUTH TWICE DAILY with meals) 360 tablet 3   clobetasol ointment (TEMOVATE) 9.56 % Apply 1 application topically daily as needed (for itching).      Cyanocobalamin 2500 MCG TABS Take 500 mcg by mouth daily.      diclofenac sodium (VOLTAREN) 1 % GEL Apply 1 application topically 4 (four) times daily as needed  (For pain.).     Dulaglutide 1.5 MG/0.5ML SOPN Inject 1 Dose into the skin once a week. Wednesday's     felodipine (PLENDIL) 5 MG 24 hr tablet TAKE TWO TABLETS BY MOUTH DAILY 180 tablet 0   furosemide (LASIX) 20 MG tablet TAKE ONE TABLET BY MOUTH ONE TIME DAILY *TAKE 1 TAB TWICE DAILY FOR 3-5 DAYS IF INCREASED LEG SWELLING OR SHORTNESS OF BREATH* (Patient taking differently: 40 mg. Patient takes 40 mg daily.  Take additional 20 mg 3-5 days if swelling per patient.) 90 tablet 0   HYDROcodone-acetaminophen (NORCO/VICODIN) 5-325 MG tablet 2 (two) times daily.     loratadine (CLARITIN) 10 MG tablet Take 10 mg by mouth every morning.      losartan (COZAAR) 100 MG tablet Take 100 mg by mouth every evening.      MEGARED OMEGA-3 KRILL OIL PO Take 1 tablet by mouth daily.     metFORMIN (GLUCOPHAGE) 1000 MG tablet Take 500 mg by mouth daily with breakfast. Every other day     Multiple Vitamin (MULTIVITAMIN WITH MINERALS) TABS tablet Take 1 tablet by mouth daily.     ONETOUCH VERIO test strip USE TO TEST BID AS DIRECTED  2   Polyethyl Glycol-Propyl Glycol (SYSTANE OP) Apply to eye as needed.     potassium chloride SA (K-DUR) 20 MEQ tablet      simvastatin (ZOCOR) 40 MG tablet Take 40 mg by mouth every evening.     spironolactone (ALDACTONE) 50 MG tablet Take 1 tablet by mouth daily.     Turmeric 400 MG CAPS Take by mouth daily.     urea (CARMOL) 10 % cream Apply topically as needed.     chlorhexidine (PERIDEX) 0.12 % solution Use as directed 15 mLs in the mouth or throat 2 (two) times daily. 120 mL 0   Skin Protectants, Misc. (NEOSPORIN LIP HEALTH OVERNIGHT) OINT Apply 1 application  topically 2 (two) times daily. 7.7 g 0   No facility-administered medications prior to visit.      Review of Systems  Review of Systems N/a  Physical Exam  BP 124/68 (BP Location: Left Arm, Patient Position: Sitting, Cuff Size: Normal)   Pulse 67   Temp 98.9 F (37.2 C) (Oral)   Wt (!) 314 lb (142.4 kg)   SpO2  94%   BMI 40.32 kg/m  Physical Exam  Neuro: Well-appearing, no acute distress Eyes: EOMI, no icterus Neck: Supple, no JVP appreciated Pulmonary: Clear, normal work of breathing Cardiovascular: Regular rate and rhythm, no murmurs   Lab Results: Personally reviewed CBC    Component Value Date/Time   WBC 5.7 07/16/2021 0435   RBC 4.29 07/16/2021 0435   HGB 12.0 (L) 07/16/2021 0435   HGB 14.1 09/10/2017 1056   HCT 38.0 (L) 07/16/2021 0435   HCT 42.3 09/10/2017 1056   PLT 181 07/16/2021 0435   PLT 141 (L) 09/10/2017 1056  MCV 88.6 07/16/2021 0435   MCV 81 09/10/2017 1056   MCH 28.0 07/16/2021 0435   MCHC 31.6 07/16/2021 0435   RDW 14.3 07/16/2021 0435   RDW 15.1 09/10/2017 1056   LYMPHSABS 1.6 07/13/2021 1339   MONOABS 1.7 (H) 07/13/2021 1339   EOSABS 0.1 07/13/2021 1339   BASOSABS 0.0 07/13/2021 1339    BMET    Component Value Date/Time   NA 134 (L) 07/14/2021 0122   NA 140 11/09/2020 0806   K 3.5 07/14/2021 0122   CL 97 (L) 07/14/2021 0122   CO2 28 07/14/2021 0122   GLUCOSE 152 (H) 07/14/2021 0122   BUN 17 07/14/2021 0122   BUN 16 11/09/2020 0806   CREATININE 1.29 (H) 07/14/2021 0122   CREATININE 1.14 03/03/2017 1028   CALCIUM 8.4 (L) 07/14/2021 0122   GFRNONAA 56 (L) 07/14/2021 0122   GFRAA 57 (L) 11/09/2020 0806    BNP    Component Value Date/Time   BNP 15.9 07/13/2021 1339    ProBNP No results found for: "PROBNP"  Imaging: Personally reviewed and interpreted as CTA 07/13/2021 with no PE seen right lower lobe wedge-shaped infiltrate concerning for infarct Nuclear medicine perfusion scan 07/15/2021 without evidence of pulmonary embolus  Assessment & Plan:   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   Presumed related to blood clot.  Discussed risk and benefits of ongoing anticoagulation.  Given unprovoked nature would recommend lifelong which he understands.  Continue  apixaban 5 mg twice daily.  OSA on CPAP: continue CPAP, oxygen bleed in.  He reports good adherence.   He continues to benefit clinically from ongoing CPAP usage.  Return to clinic in 1 year or sooner as needed with Dr. Doylene Canard, MD 01/06/2023

## 2023-01-06 NOTE — Patient Instructions (Signed)
Nice to see you again  No changes to medications  Continue apixaban 5 mg twice a day, hopefully manufacturing assistance will kick in soon once you meet the out-of-pocket requirement.  I am happy to help with oxygen moving forward.  Return to clinic in 1 year or sooner if needed with Dr. Silas Flood

## 2023-01-16 DIAGNOSIS — I2699 Other pulmonary embolism without acute cor pulmonale: Secondary | ICD-10-CM | POA: Diagnosis not present

## 2023-01-16 DIAGNOSIS — G4733 Obstructive sleep apnea (adult) (pediatric): Secondary | ICD-10-CM | POA: Diagnosis not present

## 2023-01-20 DIAGNOSIS — D696 Thrombocytopenia, unspecified: Secondary | ICD-10-CM | POA: Diagnosis not present

## 2023-01-20 DIAGNOSIS — I5042 Chronic combined systolic (congestive) and diastolic (congestive) heart failure: Secondary | ICD-10-CM | POA: Diagnosis not present

## 2023-01-20 DIAGNOSIS — N1831 Chronic kidney disease, stage 3a: Secondary | ICD-10-CM | POA: Diagnosis not present

## 2023-01-20 DIAGNOSIS — I712 Thoracic aortic aneurysm, without rupture, unspecified: Secondary | ICD-10-CM | POA: Diagnosis not present

## 2023-01-20 DIAGNOSIS — I700 Atherosclerosis of aorta: Secondary | ICD-10-CM | POA: Diagnosis not present

## 2023-01-20 DIAGNOSIS — E785 Hyperlipidemia, unspecified: Secondary | ICD-10-CM | POA: Diagnosis not present

## 2023-01-20 DIAGNOSIS — E1129 Type 2 diabetes mellitus with other diabetic kidney complication: Secondary | ICD-10-CM | POA: Diagnosis not present

## 2023-01-20 DIAGNOSIS — I2699 Other pulmonary embolism without acute cor pulmonale: Secondary | ICD-10-CM | POA: Diagnosis not present

## 2023-01-20 DIAGNOSIS — I130 Hypertensive heart and chronic kidney disease with heart failure and stage 1 through stage 4 chronic kidney disease, or unspecified chronic kidney disease: Secondary | ICD-10-CM | POA: Diagnosis not present

## 2023-02-14 DIAGNOSIS — G4733 Obstructive sleep apnea (adult) (pediatric): Secondary | ICD-10-CM | POA: Diagnosis not present

## 2023-02-14 DIAGNOSIS — I2699 Other pulmonary embolism without acute cor pulmonale: Secondary | ICD-10-CM | POA: Diagnosis not present

## 2023-02-20 ENCOUNTER — Encounter: Payer: Self-pay | Admitting: Podiatry

## 2023-02-20 ENCOUNTER — Ambulatory Visit: Payer: Medicare HMO | Admitting: Podiatry

## 2023-02-20 DIAGNOSIS — E119 Type 2 diabetes mellitus without complications: Secondary | ICD-10-CM | POA: Diagnosis not present

## 2023-02-20 DIAGNOSIS — B351 Tinea unguium: Secondary | ICD-10-CM

## 2023-02-20 DIAGNOSIS — M79674 Pain in right toe(s): Secondary | ICD-10-CM | POA: Diagnosis not present

## 2023-02-20 DIAGNOSIS — M79675 Pain in left toe(s): Secondary | ICD-10-CM

## 2023-02-20 NOTE — Progress Notes (Signed)
This patient returns to my office for at risk foot care.  This patient requires this care by a professional since this patient will be at risk due to having  Diabetes.  This patient is unable to cut nails himself since the patient cannot reach his nails.These nails are painful walking and wearing shoes. Patient requests diabetic shoes. This patient presents for at risk foot care today.  General Appearance  Alert, conversant and in no acute stress.  Vascular  Dorsalis pedis  are  weakly  palpable  bilaterally.  Posterior tibial pulses are absent due to ankle swelling.  Absent pedal hair.Capillary return is within normal limits  bilaterally. Cold feet bilaterally.  Neurologic  Senn-Weinstein monofilament wire test diminished  bilaterally. Muscle power within normal limits bilaterally.  Nails Thick disfigured discolored nails with subungual debris  from hallux to fifth toes bilaterally. No evidence of bacterial infection or drainage bilaterally.  Orthopedic  No limitations of motion  feet .  No crepitus or effusions noted.  No bony pathology or digital deformities noted. HAV  B/L.  PTT tendinitis/arthritis left foot.  Pes planus  Skin  normotropic skin with no porokeratosis noted bilaterally.  No signs of infections or ulcers noted.     Onychomycosis  Pain in right toes  Pain in left toes  Consent was obtained for treatment procedures.   Mechanical debridement of nails 1-5  bilaterally performed with a nail nipper.  Filed with dremel without incident.    Return office visit   3 months                   Told patient to return for periodic foot care and evaluation due to potential at risk complications.   Bradley Alvarez DPM  

## 2023-02-26 ENCOUNTER — Encounter: Payer: Self-pay | Admitting: Surgery

## 2023-03-04 ENCOUNTER — Ambulatory Visit
Admission: RE | Admit: 2023-03-04 | Discharge: 2023-03-04 | Disposition: A | Discharge status: Home / Self Care | Payer: Medicare HMO | Source: Ambulatory Visit | Attending: Surgery | Admitting: Surgery

## 2023-03-04 ENCOUNTER — Ambulatory Visit: Payer: Medicare HMO | Admitting: Surgery

## 2023-03-04 ENCOUNTER — Encounter: Payer: Self-pay | Admitting: Surgery

## 2023-03-04 VITALS — BP 117/58 | HR 67 | Resp 20 | Ht 74.0 in | Wt 314.0 lb

## 2023-03-04 DIAGNOSIS — I7121 Aneurysm of the ascending aorta, without rupture: Secondary | ICD-10-CM | POA: Diagnosis not present

## 2023-03-04 DIAGNOSIS — I712 Thoracic aortic aneurysm, without rupture, unspecified: Secondary | ICD-10-CM | POA: Diagnosis not present

## 2023-03-04 DIAGNOSIS — I288 Other diseases of pulmonary vessels: Secondary | ICD-10-CM | POA: Diagnosis not present

## 2023-03-04 DIAGNOSIS — I2510 Atherosclerotic heart disease of native coronary artery without angina pectoris: Secondary | ICD-10-CM | POA: Diagnosis not present

## 2023-03-04 DIAGNOSIS — K8020 Calculus of gallbladder without cholecystitis without obstruction: Secondary | ICD-10-CM | POA: Diagnosis not present

## 2023-03-04 MED ORDER — IOPAMIDOL (ISOVUE-370) INJECTION 76%
75.0000 mL | Freq: Once | INTRAVENOUS | Status: AC | PRN
  Administered 2023-03-04: 75 mL via INTRAVENOUS

## 2023-03-04 NOTE — Progress Notes (Signed)
HPI:  The patient is an 82 year old gentleman with a history of type 2 diabetes, stage III chronic kidney disease, hypertension, hyperlipidemia, morbid obesity, OSA on CPAP, abdominal aortic aneurysm measuring 3.6 cm on recent aortic duplex, and combined chronic systolic and diastolic congestive heart failure followed by Dr. Virgina Jock.  I last saw him on 02/06/2021 for initial consultation concerning a 5 x 4.9 cm fusiform ascending aortic aneurysm by CT scan.  I reviewed his previous CT scans dating back to 2010 and it did not appear that the aneurysm had changed much since then.  He continues to remain asymptomatic without chest or back pain.  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  He can walk in his house around his yard with a cane.  He uses a stand-up rolling walker with arm supports for balance if he has to walk any distance..   Current Outpatient Medications  Medication Sig Dispense Refill   apixaban (ELIQUIS) 5 MG TABS tablet Take 2 tablets (10mg ) twice daily for 7 days, then 1 tablet (5mg ) twice daily 180 tablet 3   BIDIL 20-37.5 MG tablet Take 2 tablets by mouth 3 (three) times daily. 540 tablet 3   carvedilol (COREG) 25 MG tablet TAKE TWO TABLETS BY MOUTH TWICE DAILY with meals (Patient taking differently: Take 25 mg by mouth 2 (two) times daily with a meal. TAKE TWO TABLETS BY MOUTH TWICE DAILY with meals) 360 tablet 3   clobetasol ointment (TEMOVATE) [id number] % Apply 1 application topically daily as needed (for itching).      Cyanocobalamin 2500 MCG TABS Take 500 mcg by mouth daily.      diclofenac sodium (VOLTAREN) 1 % GEL Apply 1 application topically 4 (four) times daily as needed (For pain.).     Dulaglutide 1.5 MG/0.5ML SOPN Inject 1 Dose into the skin once a week. Wednesday's     felodipine (PLENDIL) 5 MG 24 hr tablet TAKE TWO TABLETS BY MOUTH DAILY 180 tablet 0   furosemide (LASIX) 20 MG tablet TAKE ONE TABLET BY MOUTH ONE TIME  DAILY *TAKE 1 TAB TWICE DAILY FOR 3-5 DAYS IF INCREASED LEG SWELLING OR SHORTNESS OF BREATH* (Patient taking differently: 40 mg. Patient takes 40 mg daily.  Take additional 20 mg 3-5 days if swelling per patient.) 90 tablet 0   HYDROcodone-acetaminophen (NORCO/VICODIN) 5-325 MG tablet 2 (two) times daily.     loratadine (CLARITIN) 10 MG tablet Take 10 mg by mouth every morning.      losartan (COZAAR) 100 MG tablet Take 100 mg by mouth every evening.      MEGARED OMEGA-3 KRILL OIL PO Take 1 tablet by mouth daily.     Multiple Vitamin (MULTIVITAMIN WITH MINERALS) TABS tablet Take 1 tablet by mouth daily.     ONETOUCH VERIO test strip USE TO TEST BID AS DIRECTED  2   Polyethyl Glycol-Propyl Glycol (SYSTANE OP) Apply to eye as needed.     potassium chloride SA (K-DUR) 20 MEQ tablet      simvastatin (ZOCOR) 40 MG tablet Take 40 mg by mouth every evening.     spironolactone (ALDACTONE) 50 MG tablet Take 1 tablet by mouth daily.     Turmeric 400 MG CAPS Take by mouth daily.     urea (CARMOL) 10 % cream Apply topically as needed.     metFORMIN (GLUCOPHAGE) 1000 MG tablet Take 500 mg by mouth daily with breakfast. Every other day     No  current facility-administered medications for this visit.     Physical Exam: BP (!) 117/58   Pulse 67   Resp 20   Ht 6\' 2"  (1.88 m)   Wt (!) 314 lb (142.4 kg)   SpO2 93% Comment: RA  BMI 40.32 kg/m  He looks well. Cardiac exam shows regular rate and rhythm with normal heart sounds.  There is no murmur. Lungs are clear.  Diagnostic Tests: ***  Impression: ***  Plan: ***   Gaye Pollack, MD [facility] [phone]

## 2023-03-17 DIAGNOSIS — I2699 Other pulmonary embolism without acute cor pulmonale: Secondary | ICD-10-CM | POA: Diagnosis not present

## 2023-03-17 DIAGNOSIS — G4733 Obstructive sleep apnea (adult) (pediatric): Secondary | ICD-10-CM | POA: Diagnosis not present

## 2023-04-12 DIAGNOSIS — G4733 Obstructive sleep apnea (adult) (pediatric): Secondary | ICD-10-CM | POA: Diagnosis not present

## 2023-04-16 DIAGNOSIS — G4733 Obstructive sleep apnea (adult) (pediatric): Secondary | ICD-10-CM | POA: Diagnosis not present

## 2023-04-16 DIAGNOSIS — I2699 Other pulmonary embolism without acute cor pulmonale: Secondary | ICD-10-CM | POA: Diagnosis not present

## 2023-05-13 DIAGNOSIS — G4733 Obstructive sleep apnea (adult) (pediatric): Secondary | ICD-10-CM | POA: Diagnosis not present

## 2023-05-17 DIAGNOSIS — G4733 Obstructive sleep apnea (adult) (pediatric): Secondary | ICD-10-CM | POA: Diagnosis not present

## 2023-05-17 DIAGNOSIS — I2699 Other pulmonary embolism without acute cor pulmonale: Secondary | ICD-10-CM | POA: Diagnosis not present

## 2023-05-25 ENCOUNTER — Ambulatory Visit: Payer: Medicare HMO | Admitting: Podiatry

## 2023-05-27 ENCOUNTER — Ambulatory Visit: Payer: Medicare HMO | Admitting: Podiatry

## 2023-05-27 ENCOUNTER — Encounter: Payer: Self-pay | Admitting: Podiatry

## 2023-05-27 DIAGNOSIS — M79675 Pain in left toe(s): Secondary | ICD-10-CM

## 2023-05-27 DIAGNOSIS — B351 Tinea unguium: Secondary | ICD-10-CM

## 2023-05-27 DIAGNOSIS — E119 Type 2 diabetes mellitus without complications: Secondary | ICD-10-CM

## 2023-05-27 DIAGNOSIS — M79674 Pain in right toe(s): Secondary | ICD-10-CM | POA: Diagnosis not present

## 2023-05-27 NOTE — Progress Notes (Signed)
This patient returns to my office for at risk foot care.  This patient requires this care by a professional since this patient will be at risk due to having  Diabetes.  This patient is unable to cut nails himself since the patient cannot reach his nails.These nails are painful walking and wearing shoes. Patient requests diabetic shoes. This patient presents for at risk foot care today.  General Appearance  Alert, conversant and in no acute stress.  Vascular  Dorsalis pedis  are  weakly  palpable  bilaterally.  Posterior tibial pulses are absent due to ankle swelling.  Absent pedal hair.Capillary return is within normal limits  bilaterally. Cold feet bilaterally.  Neurologic  Senn-Weinstein monofilament wire test diminished  bilaterally. Muscle power within normal limits bilaterally.  Nails Thick disfigured discolored nails with subungual debris  from hallux to fifth toes bilaterally. No evidence of bacterial infection or drainage bilaterally.  Orthopedic  No limitations of motion  feet .  No crepitus or effusions noted.  No bony pathology or digital deformities noted. HAV  B/L.  PTT tendinitis/arthritis left foot.  Pes planus  Skin  normotropic skin with no porokeratosis noted bilaterally.  No signs of infections or ulcers noted.     Onychomycosis  Pain in right toes  Pain in left toes  Consent was obtained for treatment procedures.   Mechanical debridement of nails 1-5  bilaterally performed with a nail nipper.  Filed with dremel without incident.    Return office visit   3 months                   Told patient to return for periodic foot care and evaluation due to potential at risk complications.   Mylena Sedberry DPM  

## 2023-06-02 DIAGNOSIS — E1129 Type 2 diabetes mellitus with other diabetic kidney complication: Secondary | ICD-10-CM | POA: Diagnosis not present

## 2023-06-05 DIAGNOSIS — N401 Enlarged prostate with lower urinary tract symptoms: Secondary | ICD-10-CM | POA: Diagnosis not present

## 2023-06-05 DIAGNOSIS — D696 Thrombocytopenia, unspecified: Secondary | ICD-10-CM | POA: Diagnosis not present

## 2023-06-05 DIAGNOSIS — I130 Hypertensive heart and chronic kidney disease with heart failure and stage 1 through stage 4 chronic kidney disease, or unspecified chronic kidney disease: Secondary | ICD-10-CM | POA: Diagnosis not present

## 2023-06-05 DIAGNOSIS — E785 Hyperlipidemia, unspecified: Secondary | ICD-10-CM | POA: Diagnosis not present

## 2023-06-09 DIAGNOSIS — Z0000 Encounter for general adult medical examination without abnormal findings: Secondary | ICD-10-CM | POA: Diagnosis not present

## 2023-06-09 DIAGNOSIS — I2699 Other pulmonary embolism without acute cor pulmonale: Secondary | ICD-10-CM | POA: Diagnosis not present

## 2023-06-09 DIAGNOSIS — I130 Hypertensive heart and chronic kidney disease with heart failure and stage 1 through stage 4 chronic kidney disease, or unspecified chronic kidney disease: Secondary | ICD-10-CM | POA: Diagnosis not present

## 2023-06-09 DIAGNOSIS — E785 Hyperlipidemia, unspecified: Secondary | ICD-10-CM | POA: Diagnosis not present

## 2023-06-09 DIAGNOSIS — Z1331 Encounter for screening for depression: Secondary | ICD-10-CM | POA: Diagnosis not present

## 2023-06-09 DIAGNOSIS — I5042 Chronic combined systolic (congestive) and diastolic (congestive) heart failure: Secondary | ICD-10-CM | POA: Diagnosis not present

## 2023-06-09 DIAGNOSIS — R82998 Other abnormal findings in urine: Secondary | ICD-10-CM | POA: Diagnosis not present

## 2023-06-09 DIAGNOSIS — Z1339 Encounter for screening examination for other mental health and behavioral disorders: Secondary | ICD-10-CM | POA: Diagnosis not present

## 2023-06-09 DIAGNOSIS — I429 Cardiomyopathy, unspecified: Secondary | ICD-10-CM | POA: Diagnosis not present

## 2023-06-09 DIAGNOSIS — H35039 Hypertensive retinopathy, unspecified eye: Secondary | ICD-10-CM | POA: Diagnosis not present

## 2023-06-09 DIAGNOSIS — I712 Thoracic aortic aneurysm, without rupture, unspecified: Secondary | ICD-10-CM | POA: Diagnosis not present

## 2023-06-09 DIAGNOSIS — N1831 Chronic kidney disease, stage 3a: Secondary | ICD-10-CM | POA: Diagnosis not present

## 2023-06-09 DIAGNOSIS — I700 Atherosclerosis of aorta: Secondary | ICD-10-CM | POA: Diagnosis not present

## 2023-06-09 DIAGNOSIS — E1129 Type 2 diabetes mellitus with other diabetic kidney complication: Secondary | ICD-10-CM | POA: Diagnosis not present

## 2023-06-09 DIAGNOSIS — D696 Thrombocytopenia, unspecified: Secondary | ICD-10-CM | POA: Diagnosis not present

## 2023-06-12 DIAGNOSIS — G4733 Obstructive sleep apnea (adult) (pediatric): Secondary | ICD-10-CM | POA: Diagnosis not present

## 2023-06-16 DIAGNOSIS — G4733 Obstructive sleep apnea (adult) (pediatric): Secondary | ICD-10-CM | POA: Diagnosis not present

## 2023-06-16 DIAGNOSIS — I2699 Other pulmonary embolism without acute cor pulmonale: Secondary | ICD-10-CM | POA: Diagnosis not present

## 2023-06-17 ENCOUNTER — Other Ambulatory Visit (INDEPENDENT_AMBULATORY_CARE_PROVIDER_SITE_OTHER): Payer: Medicare HMO

## 2023-06-17 ENCOUNTER — Ambulatory Visit (INDEPENDENT_AMBULATORY_CARE_PROVIDER_SITE_OTHER): Payer: Medicare HMO | Admitting: Family

## 2023-06-17 DIAGNOSIS — M25531 Pain in right wrist: Secondary | ICD-10-CM | POA: Diagnosis not present

## 2023-06-23 ENCOUNTER — Other Ambulatory Visit: Payer: Self-pay

## 2023-06-23 MED ORDER — APIXABAN 5 MG PO TABS: ORAL_TABLET | ORAL | 3 refills | Status: DC

## 2023-06-24 DIAGNOSIS — M25531 Pain in right wrist: Secondary | ICD-10-CM | POA: Diagnosis not present

## 2023-06-24 NOTE — Progress Notes (Signed)
Office Visit Note   Patient: Bradley Alvarez           Date of Birth: Sep 16, 1941           MRN: [id number] Visit Date: 06/17/2023              Requested by: Creola Corn, MD [address] [place],  [place] [address] PCP: Creola Corn, MD  Chief Complaint  Patient presents with   Right Wrist - Pain      HPI: The patient is an 82 year old gentleman who is seen today for initial evaluation of right wrist pain he woke Sunday with excruciating pain at the base of his hand primarily on the radial side.  He was having difficulty using the hand to open a bottle he is right-hand dominant.  There is no associated injury.  Mild swelling no redness  Does use a platform walker.  No history of gout.  Has been using a soft wrist band and his wife reports is a sweat band to support his wrist.  Has used Epsom salt soaks as well with modest relief  States the problem has gradually improved  Tingling in the thumb and forefinger.  No numbness  Assessment & Plan: Visit Diagnoses:  1. Pain in right wrist     Plan: Placed in a wrist splint especially for nighttime.  Concern for possible carpal tunnel syndrome versus arthritis flare.  Patient will continue with hot compresses wrist splint conservative measures May use anti-inflammatories by mouth.  Follow-Up Instructions: No follow-ups on file.   Right Hand Exam   Tenderness  The patient is experiencing tenderness in the radial area.  Range of Motion  The patient has normal right wrist ROM.   Muscle Strength  Grip: 4/5   Tests  Tinel's sign (median nerve): positive Finkelstein's test: negative  Other  Erythema: absent Sensation: normal Pulse: present   Left Hand Exam   Muscle Strength  Grip:  4/5       Patient is alert, oriented, no adenopathy, well-dressed, normal affect, normal respiratory effort.   Imaging: No results found. No images are attached to the encounter.  Labs: Lab Results  Component Value Date    HGBA1C 6.6 (H) 09/10/2017   HGBA1C 8.3 (H) 10/29/2015   HGBA1C 7.9 (H) 12/20/2012   REPTSTATUS 12/17/2012 FINAL 12/16/2012   CULT NO GROWTH 12/16/2012     Lab Results  Component Value Date   ALBUMIN 3.6 07/14/2021   ALBUMIN 4.4 09/10/2017   ALBUMIN 3.4 (L) 05/24/2016    Lab Results  Component Value Date   MG 1.5 (L) 05/23/2016   MG 1.4 (L) 05/22/2016   No results found for: "VD25OH"  No results found for: "PREALBUMIN"    Latest Ref Rng & Units 07/16/2021    4:35 AM 07/15/2021    4:40 AM 07/14/2021    1:22 AM  CBC EXTENDED  WBC 4.0 - 10.5 K/uL 5.7  6.8  8.6   RBC 4.22 - 5.81 MIL/uL 4.29  4.43  4.58   Hemoglobin 13.0 - 17.0 g/dL 78.4  69.6  29.5   HCT 39.0 - 52.0 % 38.0  39.3  40.5   Platelets 150 - 400 K/uL 181  148  156      There is no height or weight on file to calculate BMI.  Orders:  Orders Placed This Encounter  Procedures   XR Wrist 2 Views Right   No orders of the defined types were placed in this encounter.  Procedures: No procedures performed  Clinical Data: No additional findings.  ROS:  All other systems negative, except as noted in the HPI. Review of Systems  Objective: Vital Signs: There were no vitals taken for this visit.  Specialty Comments:  No specialty comments available.  PMFS History: Patient Active Problem List   Diagnosis Date Noted   Blood clotting disorder (HCC) 08/19/2021   Acute respiratory failure (HCC) 07/31/2021   Pulmonary embolism (HCC) 07/13/2021   Coronary artery disease involving native coronary artery of native heart without angina pectoris 05/02/2021   Thoracic aortic aneurysm without rupture (HCC) 05/03/2020   Exudative age-related macular degeneration of left eye with active choroidal neovascularization (HCC) 03/28/2020   Retinal hemorrhage of left eye 03/28/2020   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   Pseudophakia 03/28/2020   Right epiretinal membrane 03/28/2020   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 03/28/2020   Abdominal aortic aneurysm (AAA) 30 to 34 mm in diameter (HCC) 09/24/2019   Laboratory examination 03/07/2019   Dilated aortic root (HCC) 02/04/2019   Enlargement of abdominal aorta (HCC) 02/04/2019   Acute kidney injury (HCC) 05/22/2016   Hyponatremia 05/22/2016   Hyperlipidemia LDL goal <70 12/27/2015   OSA (obstructive sleep apnea) 04/26/2015   Edema, lower extremity 06/13/2013   Retention, urine 12/23/2012   Hypokalemia 12/22/2012   Postoperative anemia due to acute blood loss 12/22/2012   Skin bulla 12/22/2012   Essential hypertension    Diabetes mellitus type 2, uncontrolled, with complications (HCC)    Hypercholesteremia    Lichen planus    Left knee DJD    Morbid obesity with BMI of 45.0-49.9, adult (HCC)    Sleep apnea, obstructive    Benign prostate hyperplasia    Diabetes mellitus (HCC) 05/29/2011   Past Medical History:  Diagnosis Date   AAA (abdominal aortic aneurysm) without rupture (HCC)    followed by dr Rosalee Kaufman--  per office note dated 11-05-2018  3.5cm   Benign essential HTN    cardiologist-- dr Candis Shine   Chronic combined systolic (congestive) and diastolic (congestive) heart failure [facility])    cardiologist-- dr Arnell Sieving   CKD (chronic kidney disease), stage III (HCC)    Diabetes mellitus type 2, insulin dependent (HCC)    followed by dr Timothy Lasso   Diabetic neuropathy (HCC)    DOE (dyspnea on exertion)    Edema of both lower extremities    Full dentures    HOH (hard of hearing)    does wear hearing aids   Hydrocele, bilateral    Lichen planus    Mixed hyperlipidemia    Morbid obesity with BMI of 45.0-49.9, adult (HCC)    Nocturia    OA (osteoarthritis)    OSA on CPAP 10/14/08   per study  AHI  61.8/hr ,  RDI  96.1/hr.     Family History  Problem Relation Age of Onset   Hypertension Mother    Diabetes Mellitus II Mother    Heart attack Father    Diabetes Mellitus II Sister     Heart disease Sister    Diabetes Mellitus II Brother    Stroke Brother    Diabetes Mellitus II Brother    Kidney disease Brother    Diabetes Mellitus II Brother    Diabetes Mellitus II Sister    Heart disease Sister    Hypertension Other    Diabetes Mellitus II Other    Stroke Other    Heart disease Other  Past Surgical History:  Procedure Laterality Date   CARDIAC CATHETERIZATION  per pt yrs ago   was told no blockage   CARDIOVASCULAR STRESS TEST  07-19-2014   dr Nicki Guadalajara   Low risk nuclear study w/ small apical attentuation artifact/  normal LV function and wall motion , ef 53%   CATARACT EXTRACTION W/ INTRAOCULAR LENS  IMPLANT, BILATERAL  2018   COLONOSCOPY W/ BIOPSIES AND POLYPECTOMY     COLONOSCOPY WITH PROPOFOL N/A 05/16/2016   Procedure: COLONOSCOPY WITH PROPOFOL;  Surgeon: Jeani Hawking, MD;  Location: [facility];  Service: Endoscopy;  Laterality: N/A;   HYDROCELE EXCISION Bilateral 11/15/2018   Procedure: HYDROCELECTOMY ADULT;  Surgeon: Ihor Gully, MD;  Location: [facility];  Service: Urology;  Laterality: Bilateral;   KNEE ARTHROSCOPY Right 2009   MULTIPLE TOOTH EXTRACTIONS     ORIF ANKLE FRACTURE Left 09/13/2014   Procedure: Open Reduction Internal Fixation Left Ankle Medial Malleolus;  Surgeon: Nadara Mustard, MD;  Location: [facility] OR;  Service: Orthopedics;  Laterality: Left;   PROSTATECTOMY  1993 approx.  by dr Retta Diones   for prostate enlargement   TOTAL KNEE ARTHROPLASTY Right 08/27/2009   dr Thurston Hole @[facility]    TOTAL KNEE ARTHROPLASTY  12/20/2012   Procedure: TOTAL KNEE ARTHROPLASTY;  Surgeon: Nilda Simmer, MD;  Location: [facility] OR;  Service: Orthopedics;  Laterality: Left;  left total knee arthroplasty   TRANSTHORACIC ECHOCARDIOGRAM  11-04-2018   dr patwardhan   moderate to severe concentric LVH, ef 56%,  grade 1 diastolic dysfunciton/  mild AV calcification annulus and leaflets without stentosis ,  mild regurg./  moderate LAE/ dilated aortic  root, 4.5cm/  mild MV annulus calcification with trace regurg./  trace TR   Social History   Occupational History   Not on file  Tobacco Use   Smoking status: Former    Current packs/day: 0.00    Average packs/day: 1.5 packs/day for 10.0 years (15.0 ttl pk-yrs)    Types: Cigarettes    Start date: 12/13/1961    Quit date: 12/14/1971    Years since quitting: 51.5   Smokeless tobacco: Former    Types: Snuff, Chew  Vaping Use   Vaping status: Never Used  Substance and Sexual Activity   Alcohol use: Yes    Alcohol/week: 1.0 standard drink of alcohol    Types: 1 Standard drinks or equivalent per week    Comment: one shot qhs   Drug use: No   Sexual activity: Not on file

## 2023-06-26 ENCOUNTER — Other Ambulatory Visit: Payer: Self-pay

## 2023-06-26 ENCOUNTER — Encounter: Payer: Self-pay | Admitting: Family

## 2023-07-02 ENCOUNTER — Other Ambulatory Visit: Payer: Self-pay

## 2023-07-02 MED ORDER — APIXABAN 5 MG PO TABS: ORAL_TABLET | ORAL | 3 refills | Status: DC

## 2023-07-17 DIAGNOSIS — I2699 Other pulmonary embolism without acute cor pulmonale: Secondary | ICD-10-CM | POA: Diagnosis not present

## 2023-07-17 DIAGNOSIS — G4733 Obstructive sleep apnea (adult) (pediatric): Secondary | ICD-10-CM | POA: Diagnosis not present

## 2023-07-19 DIAGNOSIS — G4733 Obstructive sleep apnea (adult) (pediatric): Secondary | ICD-10-CM | POA: Diagnosis not present

## 2023-07-20 ENCOUNTER — Ambulatory Visit: Payer: Medicare HMO | Admitting: Cardiology

## 2023-07-20 ENCOUNTER — Encounter: Payer: Self-pay | Admitting: Cardiology

## 2023-07-20 VITALS — BP 91/60 | HR 63 | Resp 16 | Ht 74.0 in | Wt 313.0 lb

## 2023-07-20 DIAGNOSIS — I714 Abdominal aortic aneurysm, without rupture, unspecified: Secondary | ICD-10-CM

## 2023-07-20 DIAGNOSIS — I2510 Atherosclerotic heart disease of native coronary artery without angina pectoris: Secondary | ICD-10-CM

## 2023-07-20 DIAGNOSIS — I10 Essential (primary) hypertension: Secondary | ICD-10-CM

## 2023-07-20 DIAGNOSIS — I712 Thoracic aortic aneurysm, without rupture, unspecified: Secondary | ICD-10-CM

## 2023-07-20 MED ORDER — BIDIL 20-37.5 MG PO TABS: 1.0000 | ORAL_TABLET | Freq: Three times a day (TID) | ORAL | Status: DC

## 2023-07-20 NOTE — Progress Notes (Signed)
Telephone visit  Subjective:   Bradley Alvarez, male    DOB: 1941-09-04, 82 y.o.   MRN: [id number]   HPI   Chief Complaint  Patient presents with   Essential hypertension   Thoracic aortic aneurysm without rupture   Follow-up    82 y.o. African-American male  with hypertension, type 2 DM, TAA, OSA, CKD  Patient is doing well.  He denies any complaints of chest pain, shortness of breath.  Blood pressure on the low side today, but denies any lightheadedness.  Oxygen saturation is in high 80s.  He has been using 1 L of oxygen at home since the diagnosis of his PE.  Even with low oxygen saturation numbers, he denies any shortness of breath symptoms at this time.    Current Outpatient Medications:    apixaban (ELIQUIS) 5 MG TABS tablet, Take 2 tablets (10mg ) twice daily for 7 days, then 1 tablet (5mg ) twice daily, Disp: 180 tablet, Rfl: 3   BIDIL 20-37.5 MG tablet, Take 2 tablets by mouth 3 (three) times daily., Disp: 540 tablet, Rfl: 3   carvedilol (COREG) 25 MG tablet, TAKE TWO TABLETS BY MOUTH TWICE DAILY with meals (Patient taking differently: Take 25 mg by mouth 2 (two) times daily with a meal. TAKE TWO TABLETS BY MOUTH TWICE DAILY with meals), Disp: 360 tablet, Rfl: 3   clobetasol ointment (TEMOVATE) 0.05 %, Apply 1 application topically daily as needed (for itching). , Disp: , Rfl:    Cyanocobalamin 2500 MCG TABS, Take 500 mcg by mouth daily. , Disp: , Rfl:    diclofenac sodium (VOLTAREN) 1 % GEL, Apply 1 application topically 4 (four) times daily as needed (For pain.)., Disp: , Rfl:    Dulaglutide 1.5 MG/0.5ML SOPN, Inject 1 Dose into the skin once a week. Wednesday's, Disp: , Rfl:    felodipine (PLENDIL) 5 MG 24 hr tablet, TAKE TWO TABLETS BY MOUTH DAILY, Disp: 180 tablet, Rfl: 0   furosemide (LASIX) 20 MG tablet, TAKE ONE TABLET BY MOUTH ONE TIME DAILY *TAKE 1 TAB TWICE DAILY FOR 3-5 DAYS IF INCREASED LEG SWELLING OR SHORTNESS OF BREATH* (Patient taking differently: 40 mg. Patient  takes 40 mg daily.  Take additional 20 mg 3-5 days if swelling per patient.), Disp: 90 tablet, Rfl: 0   HYDROcodone-acetaminophen (NORCO/VICODIN) 5-325 MG tablet, 2 (two) times daily., Disp: , Rfl:    loratadine (CLARITIN) 10 MG tablet, Take 10 mg by mouth every morning. , Disp: , Rfl:    losartan (COZAAR) 100 MG tablet, Take 100 mg by mouth every evening. , Disp: , Rfl:    MEGARED OMEGA-3 KRILL OIL PO, Take 1 tablet by mouth daily., Disp: , Rfl:    metFORMIN (GLUCOPHAGE) 1000 MG tablet, Take 500 mg by mouth daily with breakfast. Every other day, Disp: , Rfl:    Multiple Vitamin (MULTIVITAMIN WITH MINERALS) TABS tablet, Take 1 tablet by mouth daily., Disp: , Rfl:    ONETOUCH VERIO test strip, USE TO TEST BID AS DIRECTED, Disp: , Rfl: 2   Polyethyl Glycol-Propyl Glycol (SYSTANE OP), Apply to eye as needed., Disp: , Rfl:    potassium chloride SA (K-DUR) 20 MEQ tablet, , Disp: , Rfl:    simvastatin (ZOCOR) 40 MG tablet, Take 40 mg by mouth every evening., Disp: , Rfl:    spironolactone (ALDACTONE) 50 MG tablet, Take 1 tablet by mouth daily., Disp: , Rfl:    Turmeric 400 MG CAPS, Take by mouth daily., Disp: , Rfl:  urea (CARMOL) 10 % cream, Apply topically as needed., Disp: , Rfl:   Cardiovascular & other pertient studies:  EKG 07/20/2023: Sinus rhythm 65 bpm  Poor R-wave progression Otherwise normal EKG  CTA aorta 03/04/2023: 1. Stable aneurysmal disease of the ascending thoracic aorta with maximum diameter of approximately 4.7 cm. Stable aneurysmal disease of the aortic arch measuring up to 4.1 cm distally. Stable aneurysmal disease of the descending thoracic aorta measuring up to 3.9 cm. Ascending thoracic aortic aneurysm. Recommend semi-annual imaging followup by CTA or MRA and referral to cardiothoracic surgery if not already obtained. This recommendation follows 2010 ACCF/AHA/AATS/ACR/ASA/SCA/SCAI/SIR/STS/SVM Guidelines for the Diagnosis and Management of Patients With Thoracic Aortic  Disease. Circulation. 2010; 121: Z610-R604. Aortic aneurysm NOS (ICD10-I71.9) 2. Stable dilated central pulmonary arteries consistent with pulmonary hypertension. 3. Coronary atherosclerosis. 4. Cholelithiasis.   Aortic aneurysm NOS (ICD10-I71.9).  Echocardiogram 01/09/2022:  Normal LV systolic function with visual EF 55-60%. Left ventricle cavity  is normal in size. Moderate to severe  left ventricular hypertrophy.  Normal global wall motion. Doppler evidence of grade II diastolic  dysfunction, normal LAP.  Left atrial cavity is severely dilated.  Trace aortic regurgitation.  Aortic root dilated (Sinus of valsalva 4.5cm). Proximal ascending aorta  dilated 4.2cm.  Compared to study 09/21/2020 LVEF remains preserved, Mild MR is now  resolved, Mild AR is now trace, proximal ascending is dilated 42mm (prior  not well visualized).   CTA chest (PE protocol) 07/13/2021: 1. No central obstructing pulmonary embolism identified. 2. However, there is an acute wedge-shaped consolidation abutting the pleura of the RIGHT lower lobe with surrounding groundglass opacity, suspicious for pulmonary infarct. No pulmonary embolism identified, but this pulmonary finding is suspicious for occult PE within a peripheral subsegmental branch to the RIGHT lower lobe. 3. Additional patchy consolidations at the bilateral lung bases, atelectasis versus multifocal pneumonia. 4. Cholelithiasis without evidence of acute cholecystitis.  EKG 05/02/2021: Sinus rhythm 60 bpm  Leftward axis Borderline first degree AV block Low voltage in precordial leads Poor R-wave progression  CTA Chest 01/15/2021: 1. 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 Enlargement of the descending thoracic aorta measuring up to 3.3 x 3.2 cm. Ascending thoracic aortic aneurysm. Recommend semi-annual imaging followup by CTA or MRA and referral to cardiothoracic surgery if not already obtained. This  recommendation follows 2010 ACCF/AHA/AATS/ACR/ASA/SCA/SCAI/SIR/STS/SVM Guidelines for the Diagnosis and Management of Patients With Thoracic Aortic Disease. Circulation. 2010; 121: V409-W119. Aortic aneurysm NOS (ICD10-I71.9) 2. Elevation of the right hemidiaphragm with associated scarring or atelectasis of the right lower and middle lobes. 3. Coronary artery disease. 4. Enlargement of the main pulmonary artery as can be seen in pulmonary hypertension. 5. Cholelithiasis.   Aortic Atherosclerosis (ICD10-I70.0).  EKG 11/01/2020: Sinus rhythm 68 bpm Old anterior infarct Poor R-wave progression  Echocardiogram 09/21/2020:  1. Normal LV systolic function with visual EF 50-55%. Left ventricle  cavity is normal in size. Severe left ventricular hypertrophy. Normal  global wall motion. Doppler evidence of grade II (pseudonormal) diastolic  dysfunction, elevated LAP.  2. Left atrial cavity is moderately dilated.  3. Mild (Grade I) mitral regurgitation.  4. Mild (Grade I) aortic regurgitation.  5. Insignificant pericardial effusion.  6. The aortic root is dilated, Sinotubular junction 4.6cm. Proximal  ascending aorta not will visualized.  7. Compared to prior study dated 09/09/2019: No significant changes noted.  Abdominal Aortic Duplex 09/21/2020:  Moderate dilatation of the abdominal aorta is noted in the mid and distal  aorta.  Abdominal aortic aneurysm observed. An abdominal aortic aneurysm  measuring 3.66 x 3.62 x 3.61 cm is seen.  Normal iliac artery velocity right and mildly dampened left iliac artery  velocity and may suggest suggest <50% stenosis.  No significant change from 09/19/2019. Recheck in 3 years.   Recent labs: June-Oct 2023: Glucose NA, BUN/Cr 15/1.29. EGFR 53. K 4.3. Hb 12.0 HbA1C 5.5% Chol 155, TG 179, HDL 54, LDL 65 TSH 1.3 normal   Review of Systems  Cardiovascular:  Negative for chest pain, dyspnea on exertion, leg swelling, palpitations and syncope.        Vitals:   07/20/23 0844 07/20/23 0857  BP: (!) 91/50 91/60  Pulse: 65 63  Resp: 16   SpO2: (!) 85%    SpO2: (!) 89 %    There is no height or weight on file to calculate BMI. Filed Weights   07/20/23 0844  Weight: (!) 313 lb (142 kg)      Objective:   Physical Exam Vitals and nursing note reviewed.  Constitutional:      General: He is not in acute distress.    Appearance: He is obese.  Neck:     Vascular: No JVD.  Cardiovascular:     Rate and Rhythm: Normal rate and regular rhythm.     Pulses: Intact distal pulses.          Carotid pulses are 2+ on the right side and 2+ on the left side.      Dorsalis pedis pulses are 2+ on the right side and 2+ on the left side.       Posterior tibial pulses are 2+ on the right side and 2+ on the left side.     Heart sounds: Normal heart sounds. No murmur heard.    No gallop.  Pulmonary:     Effort: Pulmonary effort is normal.     Breath sounds: Normal breath sounds. No wheezing or rales.  Musculoskeletal:     Right lower leg: No edema.     Left lower leg: No edema.  Neurological:     Cranial Nerves: No cranial nerve deficit.    Visit diagnoses:    ICD-10-CM   1. Thoracic aortic aneurysm without rupture, unspecified part (HCC)  I71.20 EKG 12-Lead    PCV ECHOCARDIOGRAM COMPLETE    2. Essential hypertension  I10 PCV ECHOCARDIOGRAM COMPLETE    3. Coronary artery disease involving native coronary artery of native heart without angina pectoris  I25.10     4. Abdominal aortic aneurysm (AAA) 30 to 34 mm in diameter (HCC)  I71.40 PCV AORTA DUPLEX             Assessment & Recommendations:   82 y.o. African-American male  with hypertension, type 2 DM, TAA, OSA, CKD  Hypoxia: Oxygen saturation high 80s, without any symptoms.  On 1 L home oxygen at night. Given his diagnosis of PE, will check echocardiogram to held for pulmonary hypertension.  Primary hypertension: Blood pressures have been trending downwards  over the last several months.  I reduced his BiDil from 2 tab 3 times daily, to 1 tab 3 times daily.  In future, potentially could take up viral. Continue rest of the antihypertensive therapy, including beta-blocker, ARB.  TAA:  Stable around 4.7 cm. (03/2023). Annual imaging and f/u w/Dr. Laneta Simmers.  Continue losartan, carvedilol  Coronary artery disease: Seen on CTA No angina symptoms at this time. In presence of eliquis for PE treatment, do not recommend aspirin.  Lipids well-controlled.  Abdominal aortic aneurysm (AAA) 30 to 34 mm in diameter: US duplex 08/2020. Will recheck now.  F/u in 3 months  Victoriah Wilds Emiliano Dyer, MD [facility] Pager: [phone] Office: [phone]

## 2023-07-21 ENCOUNTER — Encounter: Payer: Self-pay | Admitting: Cardiology

## 2023-07-21 NOTE — Telephone Encounter (Signed)
From patient.

## 2023-08-14 ENCOUNTER — Ambulatory Visit: Payer: Medicare HMO

## 2023-08-14 DIAGNOSIS — I10 Essential (primary) hypertension: Secondary | ICD-10-CM | POA: Diagnosis not present

## 2023-08-14 DIAGNOSIS — I712 Thoracic aortic aneurysm, without rupture, unspecified: Secondary | ICD-10-CM

## 2023-08-14 DIAGNOSIS — I714 Abdominal aortic aneurysm, without rupture, unspecified: Secondary | ICD-10-CM | POA: Diagnosis not present

## 2023-08-14 DIAGNOSIS — I2510 Atherosclerotic heart disease of native coronary artery without angina pectoris: Secondary | ICD-10-CM | POA: Diagnosis not present

## 2023-08-14 MED ORDER — BIDIL 20-37.5 MG PO TABS: 2.0000 | ORAL_TABLET | Freq: Three times a day (TID) | ORAL | 3 refills | Status: DC

## 2023-08-14 NOTE — Progress Notes (Signed)
Discussed echocardiogram results with the patient. LVH likely due to hypertension. BP has been elevated up to 170s/80s after reducing Bidil dose. Recommend returning back to Bidil 2 tab of 20-37.5 mg tid.  Elder Negus, MD

## 2023-08-17 DIAGNOSIS — G4733 Obstructive sleep apnea (adult) (pediatric): Secondary | ICD-10-CM | POA: Diagnosis not present

## 2023-08-17 DIAGNOSIS — I2699 Other pulmonary embolism without acute cor pulmonale: Secondary | ICD-10-CM | POA: Diagnosis not present

## 2023-08-19 DIAGNOSIS — G4733 Obstructive sleep apnea (adult) (pediatric): Secondary | ICD-10-CM | POA: Diagnosis not present

## 2023-08-26 ENCOUNTER — Encounter: Payer: Self-pay | Admitting: Cardiology

## 2023-08-27 ENCOUNTER — Ambulatory Visit: Payer: Medicare HMO | Admitting: Podiatry

## 2023-08-27 ENCOUNTER — Encounter: Payer: Self-pay | Admitting: Podiatry

## 2023-08-27 DIAGNOSIS — B351 Tinea unguium: Secondary | ICD-10-CM

## 2023-08-27 DIAGNOSIS — M79675 Pain in left toe(s): Secondary | ICD-10-CM | POA: Diagnosis not present

## 2023-08-27 DIAGNOSIS — M79674 Pain in right toe(s): Secondary | ICD-10-CM | POA: Diagnosis not present

## 2023-08-27 DIAGNOSIS — E119 Type 2 diabetes mellitus without complications: Secondary | ICD-10-CM | POA: Diagnosis not present

## 2023-08-27 NOTE — Progress Notes (Signed)
This patient returns to my office for at risk foot care.  This patient requires this care by a professional since this patient will be at risk due to having  Diabetes.  This patient is unable to cut nails himself since the patient cannot reach his nails.These nails are painful walking and wearing shoes. Patient requests diabetic shoes. This patient presents for at risk foot care today.  General Appearance  Alert, conversant and in no acute stress.  Vascular  Dorsalis pedis  are  weakly  palpable  bilaterally.  Posterior tibial pulses are absent due to ankle swelling.  Absent pedal hair.Capillary return is within normal limits  bilaterally. Cold feet bilaterally.  Neurologic  Senn-Weinstein monofilament wire test diminished  bilaterally. Muscle power within normal limits bilaterally.  Nails Thick disfigured discolored nails with subungual debris  from hallux to fifth toes bilaterally. No evidence of bacterial infection or drainage bilaterally.  Orthopedic  No limitations of motion  feet .  No crepitus or effusions noted.  No bony pathology or digital deformities noted. HAV  B/L.  PTT tendinitis/arthritis left foot.  Pes planus  Skin  normotropic skin with no porokeratosis noted bilaterally.  No signs of infections or ulcers noted.     Onychomycosis  Pain in right toes  Pain in left toes  Consent was obtained for treatment procedures.   Mechanical debridement of nails 1-5  bilaterally performed with a nail nipper.  Filed with dremel without incident.    Return office visit   3 months                   Told patient to return for periodic foot care and evaluation due to potential at risk complications.   Helane Gunther DPM

## 2023-09-08 DIAGNOSIS — H35373 Puckering of macula, bilateral: Secondary | ICD-10-CM | POA: Diagnosis not present

## 2023-09-08 DIAGNOSIS — H353221 Exudative age-related macular degeneration, left eye, with active choroidal neovascularization: Secondary | ICD-10-CM | POA: Diagnosis not present

## 2023-09-16 DIAGNOSIS — I2699 Other pulmonary embolism without acute cor pulmonale: Secondary | ICD-10-CM | POA: Diagnosis not present

## 2023-09-16 DIAGNOSIS — G4733 Obstructive sleep apnea (adult) (pediatric): Secondary | ICD-10-CM | POA: Diagnosis not present

## 2023-09-18 DIAGNOSIS — G4733 Obstructive sleep apnea (adult) (pediatric): Secondary | ICD-10-CM | POA: Diagnosis not present

## 2023-09-19 DIAGNOSIS — Z23 Encounter for immunization: Secondary | ICD-10-CM | POA: Diagnosis not present

## 2023-10-02 ENCOUNTER — Other Ambulatory Visit: Payer: Self-pay | Admitting: Cardiology

## 2023-10-02 ENCOUNTER — Other Ambulatory Visit: Payer: Self-pay

## 2023-10-02 MED ORDER — APIXABAN 5 MG PO TABS: ORAL_TABLET | ORAL | 3 refills | Status: DC

## 2023-10-02 NOTE — Telephone Encounter (Signed)
Prescription refill request for Eliquis received. Indication: PE Last office visit:07/20/23 (Patwardhan)  Scr: OVERDUE  Age: 82 Weight: 142kg  Labs overdue. Called PCP to determine if labs have been drawn within the past year. Left message on voicemail for medical record.

## 2023-10-02 NOTE — Telephone Encounter (Signed)
*  STAT* If patient is at the pharmacy, call can be transferred to refill team.   1. Which medications need to be refilled? (please list name of each medication and dose if known)   apixaban (ELIQUIS) 5 MG TABS tablet   2. Would you like to learn more about the convenience, safety, & potential cost savings by using the [facility]?   3. Are you open to using the [facility] (Type [facility]. ).  4. Which pharmacy/location (including street and city if local pharmacy) is medication to be sent to?  [id number] - [place], [place] - [address]   5. Do they need a 30 day or 90 day supply?  30 day  Wife stated patient has 4 days left of this medication.  Patient has appointment scheduled on 12/5.

## 2023-10-05 NOTE — Telephone Encounter (Signed)
[place] 1.3 on 06/05/23 via PCP.   Updated labs received via fax from PCP. Refill was sent on 10/02/23 by Sigurd Sos, RN

## 2023-10-16 ENCOUNTER — Ambulatory Visit: Payer: Self-pay | Admitting: Cardiology

## 2023-10-16 ENCOUNTER — Telehealth: Payer: Self-pay | Admitting: Cardiology

## 2023-10-16 MED ORDER — BIDIL 20-37.5 MG PO TABS: 2.0000 | ORAL_TABLET | Freq: Three times a day (TID) | ORAL | 9 refills | Status: DC

## 2023-10-16 NOTE — Telephone Encounter (Signed)
Prescription assistance paperwork dropped off by patient.  Thank you.

## 2023-10-16 NOTE — Telephone Encounter (Signed)
Pt's [employer] patient assistance application was placed in Dr. Rosemary Holms mailbox for signature, before scanning to Haze Rushing, [facility] email. FYI

## 2023-10-17 DIAGNOSIS — I2699 Other pulmonary embolism without acute cor pulmonale: Secondary | ICD-10-CM | POA: Diagnosis not present

## 2023-10-17 DIAGNOSIS — G4733 Obstructive sleep apnea (adult) (pediatric): Secondary | ICD-10-CM | POA: Diagnosis not present

## 2023-10-19 ENCOUNTER — Telehealth: Payer: Self-pay

## 2023-10-19 ENCOUNTER — Other Ambulatory Visit: Payer: Self-pay | Admitting: Cardiology

## 2023-10-19 ENCOUNTER — Other Ambulatory Visit (HOSPITAL_COMMUNITY): Payer: Self-pay

## 2023-10-19 NOTE — Telephone Encounter (Signed)
Pharmacy Patient Advocate Encounter  Received notification from [facility] that Prior Authorization for BIDIL has been APPROVED from 12/01/22 to 12/01/23. Ran test claim, Copay is $100. This test claim was processed through [facility]- copay amounts may vary at other pharmacies due to pharmacy/plan contracts, or as the patient moves through the different stages of their insurance plan.

## 2023-10-19 NOTE — Telephone Encounter (Signed)
RECEIVED INCOMPLETE APPLICATION

## 2023-10-19 NOTE — Telephone Encounter (Signed)
Pharmacy Patient Advocate Encounter   Received notification from Patient Pharmacy that prior authorization for BIDIL is required/requested.   Insurance verification completed.   The patient is insured through [facility] .   Per test claim: PA required; PA submitted to above mentioned insurance via CoverMyMeds Key/confirmation #/EOC [id number] Status is pending

## 2023-10-20 DIAGNOSIS — H353221 Exudative age-related macular degeneration, left eye, with active choroidal neovascularization: Secondary | ICD-10-CM | POA: Diagnosis not present

## 2023-10-20 DIAGNOSIS — H35372 Puckering of macula, left eye: Secondary | ICD-10-CM | POA: Diagnosis not present

## 2023-10-20 NOTE — Telephone Encounter (Addendum)
PAP: PAP application for BIDIL, ([employer]) has been mailed to pt's home address on file. Will fax provider portion of application to provider's office when pt's portion is received. Mailed full form requesting necessary application information

## 2023-10-21 ENCOUNTER — Ambulatory Visit (HOSPITAL_BASED_OUTPATIENT_CLINIC_OR_DEPARTMENT_OTHER)
Admission: RE | Admit: 2023-10-21 | Discharge: 2023-10-21 | Disposition: A | Discharge status: Home / Self Care | Payer: Medicare HMO | Source: Ambulatory Visit | Attending: Adult Health | Admitting: Adult Health

## 2023-10-21 ENCOUNTER — Other Ambulatory Visit (HOSPITAL_COMMUNITY): Payer: Self-pay | Admitting: Adult Health

## 2023-10-21 DIAGNOSIS — N281 Cyst of kidney, acquired: Secondary | ICD-10-CM | POA: Diagnosis not present

## 2023-10-21 DIAGNOSIS — D649 Anemia, unspecified: Secondary | ICD-10-CM | POA: Diagnosis not present

## 2023-10-21 DIAGNOSIS — R0602 Shortness of breath: Secondary | ICD-10-CM | POA: Insufficient documentation

## 2023-10-21 DIAGNOSIS — R809 Proteinuria, unspecified: Secondary | ICD-10-CM | POA: Diagnosis not present

## 2023-10-21 DIAGNOSIS — I7121 Aneurysm of the ascending aorta, without rupture: Secondary | ICD-10-CM | POA: Diagnosis not present

## 2023-10-21 DIAGNOSIS — I130 Hypertensive heart and chronic kidney disease with heart failure and stage 1 through stage 4 chronic kidney disease, or unspecified chronic kidney disease: Secondary | ICD-10-CM | POA: Diagnosis not present

## 2023-10-21 DIAGNOSIS — I712 Thoracic aortic aneurysm, without rupture, unspecified: Secondary | ICD-10-CM | POA: Insufficient documentation

## 2023-10-21 DIAGNOSIS — R9389 Abnormal findings on diagnostic imaging of other specified body structures: Secondary | ICD-10-CM | POA: Diagnosis not present

## 2023-10-21 DIAGNOSIS — K8020 Calculus of gallbladder without cholecystitis without obstruction: Secondary | ICD-10-CM | POA: Diagnosis not present

## 2023-10-21 DIAGNOSIS — I714 Abdominal aortic aneurysm, without rupture, unspecified: Secondary | ICD-10-CM | POA: Diagnosis not present

## 2023-10-21 DIAGNOSIS — R109 Unspecified abdominal pain: Secondary | ICD-10-CM

## 2023-10-21 DIAGNOSIS — I5042 Chronic combined systolic (congestive) and diastolic (congestive) heart failure: Secondary | ICD-10-CM | POA: Diagnosis not present

## 2023-10-21 MED ORDER — IOHEXOL 300 MG/ML  SOLN
100.0000 mL | Freq: Once | INTRAMUSCULAR | Status: AC | PRN
  Administered 2023-10-21: 100 mL via INTRAVENOUS

## 2023-10-21 NOTE — Progress Notes (Unsigned)
@Patient  ID: Bradley Alvarez, male    DOB: 1941/10/03, 82 y.o.   MRN: [id number]  No chief complaint on file.   Referring provider: Creola Corn, MD  HPI:  82 year old male, former smoker quit 1973 (15-pack-year history).  Past medical history significant for obstructive sleep apnea, hypertension, pulmonary embolism, coronary artery disease, 1 diastolic dysfunction, thoracic aortic aneurysm, type 2 diabetes, hyperlipidemia, morbid obesity.  He was seen inpatient by [facility] team as consult for acute hypoxic respiratory failure, right lower lobe pulmonary infarct and pleurisy.  Pulmonary infarct suspected related to small/now resolved PE not seen on imaging (CTA or V/Q).  Duplex LE on 8/14 was negative for DVT.  Low suspicion for infection.  Prolactin negative. He was initially on heparin drip and was transitioned to Eliquis.  Discharged on 2 L of oxygen.  [facility] pulmonary encounter 01/06/2023-Dr. 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   Presumed related to blood clot.  Discussed risk and benefits of ongoing anticoagulation.  Given unprovoked nature would recommend lifelong which he understands.  Continue apixaban 5 mg twice daily.   OSA on CPAP: continue CPAP, oxygen bleed in.  He reports good adherence.   He continues to benefit clinically from ongoing CPAP usage.   Return to clinic in 1 year or sooner as needed with Dr. Judeth Horn  10/22/2023- interim hx  Patient presents today for ANNUAL follow-up.         No Known Allergies  Immunization History  Administered Date(s) Administered   Influenza Split 09/02/2010, 08/21/2011, 08/23/2012, 08/19/2013, 08/21/2014   Influenza, Quadrivalent, Recombinant, Inj, Pf 08/24/2018, 07/22/2019, 09/25/2020, 09/09/2021   Influenza,inj,Quad PF,6+ Mos 08/18/2014, 08/09/2015   PFIZER(Purple Top)SARS-COV-2 Vaccination 12/24/2019, 01/13/2020, 09/02/2020, 05/13/2021    Pneumococcal Conjugate-13 09/19/2013   Tdap 07/13/2012   Zoster, Live 03/07/2014    Past Medical History:  Diagnosis Date   AAA (abdominal aortic aneurysm) without rupture (HCC)    followed by dr Rosalee Kaufman--  per office note dated 11-05-2018  3.5cm   Benign essential HTN    cardiologist-- dr Candis Shine   Chronic combined systolic (congestive) and diastolic (congestive) heart failure [facility])    cardiologist-- dr Arnell Sieving   CKD (chronic kidney disease), stage III (HCC)    Diabetes mellitus type 2, insulin dependent (HCC)    followed by dr Timothy Lasso   Diabetic neuropathy (HCC)    DOE (dyspnea on exertion)    Edema of both lower extremities    Full dentures    HOH (hard of hearing)    does wear hearing aids   Hydrocele, bilateral    Lichen planus    Mixed hyperlipidemia    Morbid obesity with BMI of 45.0-49.9, adult (HCC)    Nocturia    OA (osteoarthritis)    OSA on CPAP 10/14/08   per study  AHI  61.8/hr ,  RDI  96.1/hr.     Tobacco History: Social History   Tobacco Use  Smoking Status Former   Current packs/day: 0.00   Average packs/day: 1.5 packs/day for 10.0 years (15.0 ttl pk-yrs)   Types: Cigarettes   Start date: 12/13/1961   Quit date: 12/14/1971   Years since quitting: 51.8  Smokeless Tobacco Former   Types: Snuff, Chew   Counseling given: Not Answered   Outpatient Medications Prior to Visit  Medication Sig Dispense Refill   apixaban (ELIQUIS) 5 MG TABS tablet Take 2 tablets (10mg ) twice daily for 7 days, then 1 tablet (  5mg ) twice daily 180 tablet 3   BIDIL 20-37.5 MG tablet Take 2 tablets by mouth 3 (three) times daily. 180 tablet 9   carvedilol (COREG) 25 MG tablet TAKE TWO TABLETS BY MOUTH TWICE DAILY with meals (Patient taking differently: Take 25 mg by mouth 2 (two) times daily with a meal. TAKE TWO TABLETS BY MOUTH TWICE DAILY with meals) 360 tablet 3   clobetasol ointment (TEMOVATE) 0.05 % Apply 1 application topically daily as needed (for itching).       Cyanocobalamin 2500 MCG TABS Take 500 mcg by mouth daily.      diclofenac sodium (VOLTAREN) 1 % GEL Apply 1 application topically 4 (four) times daily as needed (For pain.).     Dulaglutide 1.5 MG/0.5ML SOPN Inject 1 Dose into the skin once a week. Wednesday's     felodipine (PLENDIL) 5 MG 24 hr tablet TAKE TWO TABLETS BY MOUTH DAILY 180 tablet 0   furosemide (LASIX) 20 MG tablet TAKE ONE TABLET BY MOUTH ONE TIME DAILY *TAKE 1 TAB TWICE DAILY FOR 3-5 DAYS IF INCREASED LEG SWELLING OR SHORTNESS OF BREATH* (Patient taking differently: 40 mg. Patient takes 40 mg daily.  Take additional 20 mg 3-5 days if swelling per patient.) 90 tablet 0   HYDROcodone-acetaminophen (NORCO/VICODIN) 5-325 MG tablet 2 (two) times daily.     loratadine (CLARITIN) 10 MG tablet Take 10 mg by mouth every morning.      losartan (COZAAR) 100 MG tablet Take 100 mg by mouth every evening.      MEGARED OMEGA-3 KRILL OIL PO Take 1 tablet by mouth daily.     metFORMIN (GLUCOPHAGE) 1000 MG tablet Take 500 mg by mouth daily with breakfast. Every other day     Multiple Vitamin (MULTIVITAMIN WITH MINERALS) TABS tablet Take 1 tablet by mouth daily.     ONETOUCH VERIO test strip USE TO TEST BID AS DIRECTED  2   Polyethyl Glycol-Propyl Glycol (SYSTANE OP) Apply to eye as needed.     potassium chloride SA (K-DUR) 20 MEQ tablet      simvastatin (ZOCOR) 40 MG tablet Take 40 mg by mouth every evening.     spironolactone (ALDACTONE) 50 MG tablet Take 1 tablet by mouth daily.     Turmeric 400 MG CAPS Take by mouth daily.     urea (CARMOL) 10 % cream Apply topically as needed.     No facility-administered medications prior to visit.      Review of Systems  Review of Systems   Physical Exam  There were no vitals taken for this visit. Physical Exam   Lab Results:  CBC    Component Value Date/Time   WBC 5.7 07/16/2021 0435   RBC 4.29 07/16/2021 0435   HGB 12.0 (L) 07/16/2021 0435   HGB 14.1 09/10/2017 1056   HCT 38.0 (L)  07/16/2021 0435   HCT 42.3 09/10/2017 1056   PLT 181 07/16/2021 0435   PLT 141 (L) 09/10/2017 1056   MCV 88.6 07/16/2021 0435   MCV 81 09/10/2017 1056   MCH 28.0 07/16/2021 0435   MCHC 31.6 07/16/2021 0435   RDW 14.3 07/16/2021 0435   RDW 15.1 09/10/2017 1056   LYMPHSABS 1.6 07/13/2021 1339   MONOABS 1.7 (H) 07/13/2021 1339   EOSABS 0.1 07/13/2021 1339   BASOSABS 0.0 07/13/2021 1339    BMET    Component Value Date/Time   NA 134 (L) 07/14/2021 0122   NA 140 11/09/2020 0806   K 3.5 07/14/2021 0122  CL 97 (L) 07/14/2021 0122   CO2 28 07/14/2021 0122   GLUCOSE 152 (H) 07/14/2021 0122   BUN 17 07/14/2021 0122   BUN 16 11/09/2020 0806   CREATININE 1.29 (H) 07/14/2021 0122   CREATININE 1.14 03/03/2017 1028   CALCIUM 8.4 (L) 07/14/2021 0122   GFRNONAA 56 (L) 07/14/2021 0122   GFRAA 57 (L) 11/09/2020 0806    BNP    Component Value Date/Time   BNP 15.9 07/13/2021 1339    ProBNP No results found for: "PROBNP"  Imaging: No results found.   Assessment & Plan:   No problem-specific Assessment & Plan notes found for this encounter.     Glenford Bayley, NP 10/21/2023

## 2023-10-22 ENCOUNTER — Ambulatory Visit: Payer: Medicare HMO | Admitting: Primary Care

## 2023-10-22 ENCOUNTER — Other Ambulatory Visit (HOSPITAL_BASED_OUTPATIENT_CLINIC_OR_DEPARTMENT_OTHER): Payer: Medicare HMO

## 2023-10-22 ENCOUNTER — Encounter: Payer: Self-pay | Admitting: Primary Care

## 2023-10-22 VITALS — BP 124/61 | HR 55 | Temp 97.6°F | Ht 74.0 in | Wt 315.8 lb

## 2023-10-22 DIAGNOSIS — J986 Disorders of diaphragm: Secondary | ICD-10-CM

## 2023-10-22 DIAGNOSIS — G4733 Obstructive sleep apnea (adult) (pediatric): Secondary | ICD-10-CM

## 2023-10-22 DIAGNOSIS — R0600 Dyspnea, unspecified: Secondary | ICD-10-CM

## 2023-10-22 DIAGNOSIS — J4540 Moderate persistent asthma, uncomplicated: Secondary | ICD-10-CM | POA: Diagnosis not present

## 2023-10-22 MED ORDER — PREDNISONE 10 MG PO TABS: ORAL_TABLET | ORAL | 0 refills | Status: DC

## 2023-10-22 NOTE — Patient Instructions (Addendum)
No overt pulmonary causes of your shortness of breath, may want to discuss with cardiology obtaining right heart cath to evaluate pulmonary HTN   Sleep apena is well controlled  Use 1-2L oxygen with exertion and 2L at bedtime to maintain O2 >88-90%  Continue blood thinner as directed   Orders: PFTs re: sob  Sniff test  re: elevated hemidiaphragm   Rx:  Prednisone taper re: wheezing   Follow-up: 2-3 months with PFTs/ Follow-up Dr. Judeth Horn

## 2023-10-23 ENCOUNTER — Encounter (HOSPITAL_BASED_OUTPATIENT_CLINIC_OR_DEPARTMENT_OTHER): Payer: Self-pay | Admitting: Internal Medicine

## 2023-10-27 DIAGNOSIS — E1129 Type 2 diabetes mellitus with other diabetic kidney complication: Secondary | ICD-10-CM | POA: Diagnosis not present

## 2023-10-27 DIAGNOSIS — D696 Thrombocytopenia, unspecified: Secondary | ICD-10-CM | POA: Diagnosis not present

## 2023-10-27 DIAGNOSIS — I712 Thoracic aortic aneurysm, without rupture, unspecified: Secondary | ICD-10-CM | POA: Diagnosis not present

## 2023-10-27 DIAGNOSIS — I714 Abdominal aortic aneurysm, without rupture, unspecified: Secondary | ICD-10-CM | POA: Diagnosis not present

## 2023-10-27 DIAGNOSIS — I700 Atherosclerosis of aorta: Secondary | ICD-10-CM | POA: Diagnosis not present

## 2023-10-27 DIAGNOSIS — N1831 Chronic kidney disease, stage 3a: Secondary | ICD-10-CM | POA: Diagnosis not present

## 2023-10-27 DIAGNOSIS — I2699 Other pulmonary embolism without acute cor pulmonale: Secondary | ICD-10-CM | POA: Diagnosis not present

## 2023-10-27 DIAGNOSIS — I5042 Chronic combined systolic (congestive) and diastolic (congestive) heart failure: Secondary | ICD-10-CM | POA: Diagnosis not present

## 2023-10-27 DIAGNOSIS — I130 Hypertensive heart and chronic kidney disease with heart failure and stage 1 through stage 4 chronic kidney disease, or unspecified chronic kidney disease: Secondary | ICD-10-CM | POA: Diagnosis not present

## 2023-10-27 DIAGNOSIS — I429 Cardiomyopathy, unspecified: Secondary | ICD-10-CM | POA: Diagnosis not present

## 2023-10-27 NOTE — Telephone Encounter (Signed)
Paper Work Dropped Off: Diplomatic Services operational officer  Date:10/27/23  Location of paper:  Nature conservation officer

## 2023-10-27 NOTE — Telephone Encounter (Signed)
Patient Advocate Encounter   The patient was approved for a [employer] grant that will help cover the cost of ENTRESTO Total amount awarded, $10,000.  Effective: 08/30/23 - 08/28/24   [id number] ATF:[id number] RKYHC:[id number]   Haze Rushing, CPhT  Pharmacy Patient Advocate Specialist  Direct Number: [phone] Fax: [phone]

## 2023-10-27 NOTE — Telephone Encounter (Signed)
All forms received forms.  Provider page was signed and dated by Dr. Rosemary Holms.  Scanned all documents to [employer] email for further completion.  Will send this message to [facility] to make her aware of this.

## 2023-10-28 ENCOUNTER — Ambulatory Visit (HOSPITAL_COMMUNITY)
Admission: RE | Admit: 2023-10-28 | Discharge: 2023-10-28 | Disposition: A | Discharge status: Home / Self Care | Payer: Medicare HMO | Source: Ambulatory Visit | Attending: Primary Care | Admitting: Primary Care

## 2023-10-28 DIAGNOSIS — J986 Disorders of diaphragm: Secondary | ICD-10-CM | POA: Insufficient documentation

## 2023-10-28 DIAGNOSIS — R9389 Abnormal findings on diagnostic imaging of other specified body structures: Secondary | ICD-10-CM | POA: Diagnosis not present

## 2023-10-28 NOTE — Telephone Encounter (Signed)
PAP: Application for BIDIL has been submitted to PAP Companies: [employer], via fax If status update is requested in the meantime please refer pt to [employer] at [phone]

## 2023-10-28 NOTE — Progress Notes (Signed)
Chronic elevation the RIGHT hemidiaphragm dating back to 08/20/2009.  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  Continue PAP therapy and oxygen.  Encourage weight loss efforts.  Patient likely not a surgical candidate, continue to monitor symptoms and follow-up after pulmonary function testing.

## 2023-11-05 ENCOUNTER — Encounter: Payer: Self-pay | Admitting: Cardiology

## 2023-11-05 ENCOUNTER — Ambulatory Visit: Payer: Medicare HMO | Attending: Cardiology | Admitting: Cardiology

## 2023-11-05 VITALS — BP 140/66 | HR 60 | Resp 16 | Ht 74.0 in | Wt 314.8 lb

## 2023-11-05 DIAGNOSIS — I10 Essential (primary) hypertension: Secondary | ICD-10-CM | POA: Diagnosis not present

## 2023-11-05 DIAGNOSIS — I7121 Aneurysm of the ascending aorta, without rupture: Secondary | ICD-10-CM | POA: Diagnosis not present

## 2023-11-05 NOTE — Progress Notes (Signed)
Cardiology Office Note:  .   Date:  11/05/2023  ID:  Bradley Alvarez, DOB 1940-12-30, MRN [id number] PCP: Creola Corn, MD  [facility] Providers Cardiologist:  Truett Mainland, MD PCP: Creola Corn, MD  Chief Complaint  Patient presents with   Thoracic aortic aneurysm without rupture, unspecified part   Follow-up      History of Present Illness: .    Bradley Alvarez is a 82 y.o. male with hypertension, type 2 DM, TAA, OSA, CKD, h/o PE-on Eliquis, moderate asthma  Patient was recently seen by pulmonology for hypoxia and shortness of breath.  Moderate asthma was suspected, pulmonary function test was recommended.  There was also suspicion for diaphragmatic paralysis given his chronic elevation of right hemidiaphragm.  He is not using 2 L oxygen.  Reviewed recent CT chest results with the patient, details below.  He wanted to check blood pressure   Vitals:   11/05/23 0811  BP: (!) 140/66  Pulse: 60  Resp: 16  SpO2: 91%     ROS:  Review of Systems  Cardiovascular:  Positive for dyspnea on exertion. Negative for chest pain, leg swelling, palpitations and syncope.     Studies Reviewed: .        CT chest 10/21/2023: 1. Aneurysmal dilatation of the thoracic aorta, measuring up to 4.8 cm in the ascending aorta, previously 4.7 cm. Ascending thoracic aortic aneurysm. Recommend semi-annual imaging followup by CTA or MRA and referral to cardiothoracic surgery if not already obtained. This recommendation follows 2010 ACCF/AHA/AATS/ACR/ASA/SCA/SCAI/SIR/STS/SVM Guidelines for the Diagnosis and Management of Patients With Thoracic Aortic Disease. Circulation. 2010; 121: F621-H086. Aortic aneurysm NOS (ICD10-I71.9) 2. Aneurysmal dilatation of the distal arch has mildly progressed from prior, currently 4.3 cm, previously 4.1 cm. Attention at follow-up recommended. 3. Dilated main pulmonary artery, can be seen with pulmonary arterial hypertension. 4. Elevated right  hemidiaphragm with adjacent atelectasis/scarring. 5. Cholelithiasis without cholecystitis. 6. Borderline hepatic steatosis.   Aortic Atherosclerosis (ICD10-I70.0).   Abdominal Aortic Duplex 08/14/2023:  There is ectasia of the proximal abdominal aorta measuring at the greatest  diameter of 2.7 cm.  No AAA noted. There is diffuse heterogeneous plaque noted int he abdominal  aorta.   Normal bilateral CIA size and flow velocity.  Compared to 09/21/2020, 3 cm AAA not present. Further studies if  clinically indicated.    Physical Exam:   Physical Exam Vitals and nursing note reviewed.  Constitutional:      General: He is not in acute distress.    Comments: On supplemental oxygen  Neck:     Vascular: No JVD.  Cardiovascular:     Rate and Rhythm: Normal rate and regular rhythm.     Heart sounds: Normal heart sounds. No murmur heard. Pulmonary:     Effort: Pulmonary effort is normal.     Breath sounds: Normal breath sounds. No wheezing or rales.  Musculoskeletal:     Right lower leg: No edema.     Left lower leg: No edema.      VISIT DIAGNOSES:   ICD-10-CM   1. Thoracic aortic aneurysm without rupture, unspecified part (HCC)  I71.20 CT ANGIO CHEST AORTA W/CM & OR WO/CM    Basic metabolic panel    Basic metabolic panel       ASSESSMENT AND PLAN: .    Bradley Alvarez is a 82 y.o. male with hypertension, type 2 DM, TAA, OSA, CKD, h/o PE-on Eliquis, moderate asthma   Primary hypertension: Fairly well-controlled. No change made  today.  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 (10/2023). Annual imaging and f/u w/Dr. Laneta Simmers.  Will repeat CT angiogram chest in 10/2023.   Continue losartan, carvedilol  No AAA noted on recent ultrasound duplex 03/2023.   Coronary artery disease: Seen on CTA No angina symptoms at this time. In presence of eliquis for PE treatment, do not recommend aspirin.   Lipids well-controlled.  Moderate asthma, elevated right hemidiaphragm: On supplemental  oxygen, management as per pulmonology     F/u in 1 year  Signed, Elder Negus, MD

## 2023-11-05 NOTE — Patient Instructions (Signed)
Medication Instructions:   Your physician recommends that you continue on your current medications as directed. Please refer to the Current Medication list given to you today.  *If you need a refill on your cardiac medications before your next appointment, please call your pharmacy*    Testing/Procedures:  CT ANGIO CHEST AORTA W/WO CONTRAST--SCHEDULE THIS TO BE DONE IN NOVEMBER 2025 PER DR. PATWARDHAN --FOLLOWED WITH AN APPOINTMENT WITH HIM AFTER THE TEST   Follow-Up:  IN NOVEMBER 2025 WITH DR. PATWARDHAN--SCHEDULE AFTER THE CT ANGIO AORTA

## 2023-11-12 DIAGNOSIS — D649 Anemia, unspecified: Secondary | ICD-10-CM | POA: Diagnosis not present

## 2023-11-12 DIAGNOSIS — E785 Hyperlipidemia, unspecified: Secondary | ICD-10-CM | POA: Diagnosis not present

## 2023-11-12 DIAGNOSIS — N1831 Chronic kidney disease, stage 3a: Secondary | ICD-10-CM | POA: Diagnosis not present

## 2023-11-16 DIAGNOSIS — G4733 Obstructive sleep apnea (adult) (pediatric): Secondary | ICD-10-CM | POA: Diagnosis not present

## 2023-11-16 DIAGNOSIS — I2699 Other pulmonary embolism without acute cor pulmonale: Secondary | ICD-10-CM | POA: Diagnosis not present

## 2023-11-23 MED ORDER — BIDIL 20-37.5 MG PO TABS: 2.0000 | ORAL_TABLET | Freq: Three times a day (TID) | ORAL | 9 refills | Status: DC

## 2023-11-23 NOTE — Telephone Encounter (Signed)
Please fax a hardcopy of Bidil RX to [phone]. (PAP company is requesting a copy of RX)

## 2023-11-23 NOTE — Addendum Note (Signed)
Addended by: Loa Socks on: 11/23/2023 11:58 AM   Modules accepted: Orders

## 2023-11-23 NOTE — Telephone Encounter (Signed)
Hardcopy Rx Bidil faxed to [phone] with confirmation fax received.  Will make Haze Rushing [employer] aware of completion.

## 2023-11-23 NOTE — Telephone Encounter (Signed)
You're the best! Received, sending to company now. Thanks so much!

## 2023-11-27 ENCOUNTER — Encounter: Payer: Self-pay | Admitting: Podiatry

## 2023-11-27 ENCOUNTER — Ambulatory Visit: Payer: Medicare HMO | Admitting: Podiatry

## 2023-11-27 DIAGNOSIS — M79675 Pain in left toe(s): Secondary | ICD-10-CM | POA: Diagnosis not present

## 2023-11-27 DIAGNOSIS — M79674 Pain in right toe(s): Secondary | ICD-10-CM

## 2023-11-27 DIAGNOSIS — B351 Tinea unguium: Secondary | ICD-10-CM

## 2023-11-27 DIAGNOSIS — E119 Type 2 diabetes mellitus without complications: Secondary | ICD-10-CM | POA: Diagnosis not present

## 2023-11-27 NOTE — Progress Notes (Signed)
This patient returns to my office for at risk foot care.  This patient requires this care by a professional since this patient will be at risk due to having  Diabetes.  This patient is unable to cut nails himself since the patient cannot reach his nails.These nails are painful walking and wearing shoes. Patient requests diabetic shoes. This patient presents for at risk foot care today.  General Appearance  Alert, conversant and in no acute stress.  Vascular  Dorsalis pedis  are  weakly  palpable  bilaterally.  Posterior tibial pulses are absent due to ankle swelling.  Absent pedal hair.Capillary return is within normal limits  bilaterally. Cold feet bilaterally.  Neurologic  Senn-Weinstein monofilament wire test diminished  bilaterally. Muscle power within normal limits bilaterally.  Nails Thick disfigured discolored nails with subungual debris  from hallux to fifth toes bilaterally. No evidence of bacterial infection or drainage bilaterally.  Orthopedic  No limitations of motion  feet .  No crepitus or effusions noted.  No bony pathology or digital deformities noted. HAV  B/L.  PTT tendinitis/arthritis left foot.  Pes planus  Skin  normotropic skin with no porokeratosis noted bilaterally.  No signs of infections or ulcers noted.     Onychomycosis  Pain in right toes  Pain in left toes  Consent was obtained for treatment procedures.   Mechanical debridement of nails 1-5  bilaterally performed with a nail nipper.  Filed with dremel without incident.    Return office visit   3 months                   Told patient to return for periodic foot care and evaluation due to potential at risk complications.   Helane Gunther DPM

## 2023-12-07 ENCOUNTER — Ambulatory Visit: Payer: Medicare HMO | Admitting: Pulmonary Disease

## 2023-12-17 DIAGNOSIS — G4733 Obstructive sleep apnea (adult) (pediatric): Secondary | ICD-10-CM | POA: Diagnosis not present

## 2023-12-17 DIAGNOSIS — I2699 Other pulmonary embolism without acute cor pulmonale: Secondary | ICD-10-CM | POA: Diagnosis not present

## 2023-12-22 DIAGNOSIS — H353221 Exudative age-related macular degeneration, left eye, with active choroidal neovascularization: Secondary | ICD-10-CM | POA: Diagnosis not present

## 2023-12-30 ENCOUNTER — Ambulatory Visit (HOSPITAL_BASED_OUTPATIENT_CLINIC_OR_DEPARTMENT_OTHER): Payer: Medicare HMO | Admitting: Pulmonary Disease

## 2023-12-30 DIAGNOSIS — R0600 Dyspnea, unspecified: Secondary | ICD-10-CM | POA: Diagnosis not present

## 2023-12-30 LAB — PULMONARY FUNCTION TEST
DL/VA % pred: 124 %
DL/VA: 4.78 ml/min/mmHg/L
DLCO cor % pred: 91 %
DLCO cor: 23.05 ml/min/mmHg
DLCO unc % pred: 91 %
DLCO unc: 23.05 ml/min/mmHg
FEF 25-75 Post: 1.27 L/s
FEF 25-75 Pre: 1.58 L/s
FEF2575-%Change-Post: -19 %
FEF2575-%Pred-Post: 62 %
FEF2575-%Pred-Pre: 78 %
FEV1-%Change-Post: -3 %
FEV1-%Pred-Post: 64 %
FEV1-%Pred-Pre: 66 %
FEV1-Post: 1.91 L
FEV1-Pre: 1.98 L
FEV1FVC-%Change-Post: -1 %
FEV1FVC-%Pred-Pre: 106 %
FEV6-%Change-Post: 0 %
FEV6-%Pred-Post: 64 %
FEV6-%Pred-Pre: 65 %
FEV6-Post: 2.54 L
FEV6-Pre: 2.56 L
FEV6FVC-%Change-Post: 1 %
FEV6FVC-%Pred-Post: 106 %
FEV6FVC-%Pred-Pre: 104 %
FVC-%Change-Post: -1 %
FVC-%Pred-Post: 61 %
FVC-%Pred-Pre: 62 %
FVC-Post: 2.57 L
FVC-Pre: 2.61 L
Post FEV1/FVC ratio: 74 %
Post FEV6/FVC ratio: 99 %
Pre FEV1/FVC ratio: 76 %
Pre FEV6/FVC Ratio: 98 %
RV % pred: 90 %
RV: 2.51 L
TLC % pred: 73 %
TLC: 5.4 L

## 2023-12-30 NOTE — Patient Instructions (Signed)
Full PFT Performed Today

## 2023-12-30 NOTE — Progress Notes (Signed)
Full PFT Performed Today

## 2024-01-04 ENCOUNTER — Encounter: Payer: Self-pay | Admitting: Pulmonary Disease

## 2024-01-04 ENCOUNTER — Ambulatory Visit (INDEPENDENT_AMBULATORY_CARE_PROVIDER_SITE_OTHER): Payer: Medicare HMO | Admitting: Pulmonary Disease

## 2024-01-04 VITALS — BP 128/50 | HR 69 | Ht 74.0 in | Wt 307.0 lb

## 2024-01-04 DIAGNOSIS — L989 Disorder of the skin and subcutaneous tissue, unspecified: Secondary | ICD-10-CM

## 2024-01-04 MED ORDER — ALBUTEROL SULFATE HFA 108 (90 BASE) MCG/ACT IN AERS: 2.0000 | INHALATION_SPRAY | Freq: Four times a day (QID) | RESPIRATORY_TRACT | 6 refills | Status: AC | PRN

## 2024-01-04 MED ORDER — FLUTICASONE-SALMETEROL 250-50 MCG/ACT IN AEPB: 1.0000 | INHALATION_SPRAY | Freq: Two times a day (BID) | RESPIRATORY_TRACT | 5 refills | Status: DC

## 2024-01-04 NOTE — Patient Instructions (Addendum)
Nice to see you again  Use Advair 1 puff once a day, rinse your mouth with water after every use.  You can use albuterol 2 puffs every 4-6 hours as needed for shortness of breath, you can use the AeroChamber with the albuterol but not with the Advair.  I sent a referral to:  [facility]  [address]    [phone]  Return to clinic in 3 months or sooner as needed with Dr. Judeth Horn

## 2024-01-04 NOTE — Progress Notes (Signed)
@Patient  ID: Bradley Alvarez, male    DOB: 05-13-1941, 83 y.o.   MRN: [id number]  Chief Complaint  Patient presents with   Follow-up    Referring provider: Creola Corn, MD  HPI: 83 y.o., former smoker quit 1973 (15-pack-year history).  Past medical history significant for obstructive sleep apnea, hypertension, pulmonary embolism, coronary artery disease, 1 diastolic dysfunction, thoracic aortic aneurysm, type 2 diabetes, hyperlipidemia, morbid obesity.   Doing fine, denies significant DOE.  Was seen 10/2023 and wheezing.  Concern for asthma exacerbation.  Put on prednisone.  This helped immensely.  Went for PFTs.  Full interpretation below.  Largely normal with restriction.  Chest x-ray showed elevated hemidiaphragm.  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  During PFTs he got 3 puffs of albuterol.  He felt great.  Best his breathing that felt in a while.  Since then his breathing has been very good.  Imaging:  Sniff test 10/2023 elevated right hemidiaphragm that does not move consistent with paralysis  CXR 07/15/21 showed no acute findings  CTA 07/13/21 no central obstructing PE. Acute wedge shaped consolidation abutting the pleural of the right lower lobe with surrounding ground glass opacity, suspicious for pulmonary infarction   No Known Allergies  Immunization History  Administered Date(s) Administered   Fluad Trivalent(High Dose 65+) 08/21/2023   Influenza Split 09/02/2010, 08/21/2011, 08/23/2012, 08/19/2013, 08/21/2014   Influenza, Quadrivalent, Recombinant, Inj, Pf 08/24/2018, 07/22/2019, 09/25/2020, 09/09/2021   Influenza,inj,Quad PF,6+ Mos 08/18/2014, 08/09/2015   PFIZER(Purple Top)SARS-COV-2 Vaccination 12/24/2019, 01/13/2020, 09/02/2020, 05/13/2021   Pneumococcal Conjugate-13 09/19/2013   Tdap 07/13/2012   Zoster, Live 03/07/2014    Past Medical History:  Diagnosis Date   AAA (abdominal aortic aneurysm) without rupture (HCC)    followed by dr Rosalee Kaufman--  per  office note dated 11-05-2018  3.5cm   Benign essential HTN    cardiologist-- dr Candis Shine   Chronic combined systolic (congestive) and diastolic (congestive) heart failure [facility])    cardiologist-- dr Arnell Sieving   CKD (chronic kidney disease), stage III (HCC)    Diabetes mellitus type 2, insulin dependent (HCC)    followed by dr Timothy Lasso   Diabetic neuropathy (HCC)    DOE (dyspnea on exertion)    Edema of both lower extremities    Full dentures    HOH (hard of hearing)    does wear hearing aids   Hydrocele, bilateral    Lichen planus    Mixed hyperlipidemia    Morbid obesity with BMI of 45.0-49.9, adult (HCC)    Nocturia    OA (osteoarthritis)    OSA on CPAP 10/14/08   per study  AHI  61.8/hr ,  RDI  96.1/hr.     Tobacco History: Social History   Tobacco Use  Smoking Status Former   Current packs/day: 0.00   Average packs/day: 1.5 packs/day for 10.0 years (15.0 ttl pk-yrs)   Types: Cigarettes   Start date: 12/13/1961   Quit date: 12/14/1971   Years since quitting: 52.0  Smokeless Tobacco Former   Types: Snuff, Chew   Counseling given: Not Answered   Outpatient Medications Prior to Visit  Medication Sig Dispense Refill   apixaban (ELIQUIS) 5 MG TABS tablet Take 2 tablets (10mg ) twice daily for 7 days, then 1 tablet (5mg ) twice daily 180 tablet 3   BIDIL 20-37.5 MG tablet Take 2 tablets by mouth 3 (three) times daily. 180 tablet 9   carvedilol (COREG) 25 MG tablet TAKE TWO TABLETS BY MOUTH TWICE DAILY with meals (Patient taking  differently: Take 25 mg by mouth 2 (two) times daily with a meal. TAKE TWO TABLETS BY MOUTH TWICE DAILY with meals) 360 tablet 3   clobetasol ointment (TEMOVATE) 0.05 % Apply 1 application topically daily as needed (for itching).      Cyanocobalamin 2500 MCG TABS Take 500 mcg by mouth daily.      diclofenac sodium (VOLTAREN) 1 % GEL Apply 1 application topically 4 (four) times daily as needed (For pain.).     Dulaglutide 1.5 MG/0.5ML SOPN Inject 1 Dose  into the skin once a week. Wednesday's     felodipine (PLENDIL) 5 MG 24 hr tablet TAKE TWO TABLETS BY MOUTH DAILY 180 tablet 0   furosemide (LASIX) 20 MG tablet TAKE ONE TABLET BY MOUTH ONE TIME DAILY *TAKE 1 TAB TWICE DAILY FOR 3-5 DAYS IF INCREASED LEG SWELLING OR SHORTNESS OF BREATH* (Patient taking differently: 40 mg. Patient takes 40 mg daily.  Take additional 20 mg 3-5 days if swelling per patient.) 90 tablet 0   HYDROcodone-acetaminophen (NORCO/VICODIN) 5-325 MG tablet 2 (two) times daily.     loratadine (CLARITIN) 10 MG tablet Take 10 mg by mouth every morning.      losartan (COZAAR) 100 MG tablet Take 100 mg by mouth every evening.      MEGARED OMEGA-3 KRILL OIL PO Take 1 tablet by mouth daily.     Multiple Vitamin (MULTIVITAMIN WITH MINERALS) TABS tablet Take 1 tablet by mouth daily.     ONETOUCH VERIO test strip USE TO TEST BID AS DIRECTED  2   Polyethyl Glycol-Propyl Glycol (SYSTANE OP) Apply to eye as needed.     potassium chloride SA (K-DUR) 20 MEQ tablet      simvastatin (ZOCOR) 40 MG tablet Take 40 mg by mouth every evening.     spironolactone (ALDACTONE) 50 MG tablet Take 1 tablet by mouth daily.     Turmeric 400 MG CAPS Take by mouth as needed.     urea (CARMOL) 10 % cream Apply topically as needed.     No facility-administered medications prior to visit.      Review of Systems  Review of Systems N/a  Physical Exam  BP (!) 128/50 (BP Location: Left Arm, Patient Position: Sitting, Cuff Size: Large)   Pulse 69   Ht 6\' 2"  (1.88 m)   Wt (!) 307 lb (139.3 kg)   SpO2 94%   BMI 39.42 kg/m  Physical Exam  Neuro: Well-appearing, no acute distress Eyes: EOMI, no icterus Neck: Supple, no JVP appreciated Pulmonary: Clear, normal work of breathing Cardiovascular: Regular rate and rhythm, no murmurs   Lab Results: Personally reviewed CBC    Component Value Date/Time   WBC 5.7 07/16/2021 0435   RBC 4.29 07/16/2021 0435   HGB 12.0 (L) 07/16/2021 0435   HGB 14.1  09/10/2017 1056   HCT 38.0 (L) 07/16/2021 0435   HCT 42.3 09/10/2017 1056   PLT 181 07/16/2021 0435   PLT 141 (L) 09/10/2017 1056   MCV 88.6 07/16/2021 0435   MCV 81 09/10/2017 1056   MCH 28.0 07/16/2021 0435   MCHC 31.6 07/16/2021 0435   RDW 14.3 07/16/2021 0435   RDW 15.1 09/10/2017 1056   LYMPHSABS 1.6 07/13/2021 1339   MONOABS 1.7 (H) 07/13/2021 1339   EOSABS 0.1 07/13/2021 1339   BASOSABS 0.0 07/13/2021 1339    BMET    Component Value Date/Time   NA 134 (L) 07/14/2021 0122   NA 140 11/09/2020 0806   K 3.5 07/14/2021  0122   CL 97 (L) 07/14/2021 0122   CO2 28 07/14/2021 0122   GLUCOSE 152 (H) 07/14/2021 0122   BUN 17 07/14/2021 0122   BUN 16 11/09/2020 0806   CREATININE 1.29 (H) 07/14/2021 0122   CREATININE 1.14 03/03/2017 1028   CALCIUM 8.4 (L) 07/14/2021 0122   GFRNONAA 56 (L) 07/14/2021 0122   GFRAA 57 (L) 11/09/2020 0806    BNP    Component Value Date/Time   BNP 15.9 07/13/2021 1339    ProBNP No results found for: "PROBNP"  Imaging: Personally reviewed and interpreted as CTA 07/13/2021 with no PE seen right lower lobe wedge-shaped infiltrate concerning for infarct Nuclear medicine perfusion scan 07/15/2021 without evidence of pulmonary embolus  Assessment & Plan:   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   Presumed related to blood clot.  Discussed risk and benefits of ongoing anticoagulation.  Given unprovoked nature would recommend lifelong which he understands.  Continue apixaban 5 mg twice daily.  OSA on CPAP: continue CPAP, oxygen bleed in.  He reports good adherence.  Compliance report reviewed, AHI 0.1.  Using every night for multiple hours.  He continues to benefit clinically from ongoing CPAP usage.  Asthma: Wheeze in the past improved with prednisone.  Albuterol helps.  Advair mid dose Diskus prescribed today, appears to be preferred agent on formulary.  Albuterol as  needed prescribed today as well.  Return to clinic in 3 months or sooner as needed with Dr. Orland Mustard, MD 01/04/2024

## 2024-01-17 DIAGNOSIS — I2699 Other pulmonary embolism without acute cor pulmonale: Secondary | ICD-10-CM | POA: Diagnosis not present

## 2024-01-17 DIAGNOSIS — G4733 Obstructive sleep apnea (adult) (pediatric): Secondary | ICD-10-CM | POA: Diagnosis not present

## 2024-01-28 DIAGNOSIS — M545 Low back pain, unspecified: Secondary | ICD-10-CM | POA: Diagnosis not present

## 2024-02-05 ENCOUNTER — Other Ambulatory Visit: Payer: Self-pay | Admitting: Surgery

## 2024-02-05 DIAGNOSIS — I7121 Aneurysm of the ascending aorta, without rupture: Secondary | ICD-10-CM

## 2024-02-14 DIAGNOSIS — G4733 Obstructive sleep apnea (adult) (pediatric): Secondary | ICD-10-CM | POA: Diagnosis not present

## 2024-02-14 DIAGNOSIS — I2699 Other pulmonary embolism without acute cor pulmonale: Secondary | ICD-10-CM | POA: Diagnosis not present

## 2024-02-16 DIAGNOSIS — H353221 Exudative age-related macular degeneration, left eye, with active choroidal neovascularization: Secondary | ICD-10-CM | POA: Diagnosis not present

## 2024-02-18 DIAGNOSIS — M545 Low back pain, unspecified: Secondary | ICD-10-CM | POA: Diagnosis not present

## 2024-02-18 DIAGNOSIS — M47896 Other spondylosis, lumbar region: Secondary | ICD-10-CM | POA: Diagnosis not present

## 2024-02-24 ENCOUNTER — Telehealth: Payer: Self-pay | Admitting: *Deleted

## 2024-02-24 NOTE — Telephone Encounter (Signed)
   Pre-operative Risk Assessment    Patient Name: Bradley Alvarez  DOB: Dec 24, 1940 MRN: [id number]   Date of last office visit: 11-2023 Date of next office visit: 05-2024    Request for Surgical Clearance    Procedure:   LUMBAR MEDIAL BRANCH BLOCK   Date of Surgery:  03-01-2024                               Surgeon:   DR [facility] Surgeon's Group or Practice Name:  [facility] Phone number:  [phone] Fax number:  [phone] ATTN KELLIE NEALY    Type of Clearance Requested:   - Pharmacy:  Hold Apixaban (Eliquis) 3 DAYS  Type of Anesthesia:  Spinal   Signed, Oleta Mouse   02/24/2024, 3:52 PM

## 2024-02-26 ENCOUNTER — Ambulatory Visit: Payer: Medicare HMO | Admitting: Podiatry

## 2024-02-26 ENCOUNTER — Encounter: Payer: Self-pay | Admitting: Podiatry

## 2024-02-26 DIAGNOSIS — M79675 Pain in left toe(s): Secondary | ICD-10-CM

## 2024-02-26 DIAGNOSIS — M79674 Pain in right toe(s): Secondary | ICD-10-CM | POA: Diagnosis not present

## 2024-02-26 DIAGNOSIS — B351 Tinea unguium: Secondary | ICD-10-CM

## 2024-02-26 DIAGNOSIS — E119 Type 2 diabetes mellitus without complications: Secondary | ICD-10-CM

## 2024-02-26 NOTE — Progress Notes (Signed)
This patient returns to my office for at risk foot care.  This patient requires this care by a professional since this patient will be at risk due to having  Diabetes.  This patient is unable to cut nails himself since the patient cannot reach his nails.These nails are painful walking and wearing shoes. Patient requests diabetic shoes. This patient presents for at risk foot care today.  General Appearance  Alert, conversant and in no acute stress.  Vascular  Dorsalis pedis  are  weakly  palpable  bilaterally.  Posterior tibial pulses are absent due to ankle swelling.  Absent pedal hair.Capillary return is within normal limits  bilaterally. Cold feet bilaterally.  Neurologic  Senn-Weinstein monofilament wire test diminished  bilaterally. Muscle power within normal limits bilaterally.  Nails Thick disfigured discolored nails with subungual debris  from hallux to fifth toes bilaterally. No evidence of bacterial infection or drainage bilaterally.  Orthopedic  No limitations of motion  feet .  No crepitus or effusions noted.  No bony pathology or digital deformities noted. HAV  B/L.  PTT tendinitis/arthritis left foot.  Pes planus  Skin  normotropic skin with no porokeratosis noted bilaterally.  No signs of infections or ulcers noted.     Onychomycosis  Pain in right toes  Pain in left toes  Consent was obtained for treatment procedures.   Mechanical debridement of nails 1-5  bilaterally performed with a nail nipper.  Filed with dremel without incident.    Return office visit   3 months                   Told patient to return for periodic foot care and evaluation due to potential at risk complications.   Helane Gunther DPM

## 2024-02-26 NOTE — Progress Notes (Unsigned)
 [address]       [place] [address]             [phone]        Bradley Alvarez [id number] [birth year]  History of Present Illness:  Bradley Alvarez is an 83 yo male with known history of type 2 diabetes, stage III chronic kidney disease, hypertension, hyperlipidemia, morbid obesity, OSA on CPAP, abdominal aortic aneurysm measuring 3.6 cm on recent aortic duplex, and combined chronic systolic and diastolic congestive heart failure followed by Dr. Rosemary Holms.  He also has known Ascending Aortic Aneurysm followed by Dr. Laneta Simmers.  At his last visit this measured 4.7 cm ascending aneurysm and an aortic arch of 4.1 cm.  He presents today for 1 year follow up.    Current Outpatient Medications on File Prior to Visit  Medication Sig Dispense Refill   albuterol (VENTOLIN HFA) 108 (90 Base) MCG/ACT inhaler Inhale 2 puffs into the lungs every 6 (six) hours as needed for wheezing or shortness of breath. 1 each 6   apixaban (ELIQUIS) 5 MG TABS tablet Take 2 tablets (10mg ) twice daily for 7 days, then 1 tablet (5mg ) twice daily 180 tablet 3   BIDIL 20-37.5 MG tablet Take 2 tablets by mouth 3 (three) times daily. 180 tablet 9   carvedilol (COREG) 25 MG tablet TAKE TWO TABLETS BY MOUTH TWICE DAILY with meals (Patient taking differently: Take 25 mg by mouth 2 (two) times daily with a meal. TAKE TWO TABLETS BY MOUTH TWICE DAILY with meals) 360 tablet 3   clobetasol ointment (TEMOVATE) 0.05 % Apply 1 application topically daily as needed (for itching).      Cyanocobalamin 2500 MCG TABS Take 500 mcg by mouth daily.      diclofenac sodium (VOLTAREN) 1 % GEL Apply 1 application topically 4 (four) times daily as needed (For pain.).     Dulaglutide 1.5 MG/0.5ML SOPN Inject 1 Dose into the skin once a week. Wednesday's     felodipine (PLENDIL) 5 MG 24 hr tablet TAKE TWO TABLETS BY MOUTH DAILY 180 tablet 0   fluticasone-salmeterol (ADVAIR) 250-50 MCG/ACT AEPB Inhale 1 puff into the lungs in  the morning and at bedtime. 60 each 5   furosemide (LASIX) 20 MG tablet TAKE ONE TABLET BY MOUTH ONE TIME DAILY *TAKE 1 TAB TWICE DAILY FOR 3-5 DAYS IF INCREASED LEG SWELLING OR SHORTNESS OF BREATH* (Patient taking differently: 40 mg. Patient takes 40 mg daily.  Take additional 20 mg 3-5 days if swelling per patient.) 90 tablet 0   HYDROcodone-acetaminophen (NORCO/VICODIN) 5-325 MG tablet 2 (two) times daily.     loratadine (CLARITIN) 10 MG tablet Take 10 mg by mouth every morning.      losartan (COZAAR) 100 MG tablet Take 100 mg by mouth every evening.      MEGARED OMEGA-3 KRILL OIL PO Take 1 tablet by mouth daily.     Multiple Vitamin (MULTIVITAMIN WITH MINERALS) TABS tablet Take 1 tablet by mouth daily.     ONETOUCH VERIO test strip USE TO TEST BID AS DIRECTED  2   Polyethyl Glycol-Propyl Glycol (SYSTANE OP) Apply to eye as needed.     potassium chloride SA (K-DUR) 20 MEQ tablet      simvastatin (ZOCOR) 40 MG tablet Take 40 mg by mouth every evening.     spironolactone (ALDACTONE) 50 MG tablet Take 1 tablet by mouth daily.     Turmeric 400 MG CAPS Take by mouth as  needed.     urea (CARMOL) 10 % cream Apply topically as needed.     No current facility-administered medications on file prior to visit.     There were no vitals taken for this visit.  Physical Exam  CTA Results:  FINDINGS: Cardiovascular: Measured at the same axial level (pulmonary outflow tract), the ascending thoracic aorta measures 48 mm (58/3) compared to 48 mm.   The proximal descending thoracic aorta measures 44 mm compared to 42 mm (image 45/3).   Great vessels are normal. Bovine arch anatomy. No pericardial fluid.   Coronary artery calcification and aortic atherosclerotic calcification.   Mediastinum/Nodes: No axillary or supraclavicular adenopathy. No mediastinal or hilar adenopathy. No pericardial fluid. Esophagus normal.   Lungs/Pleura: No suspicious pulmonary nodules. Normal pleural. Airways  normal. RIGHT lung base atelectasis similar prior.   Upper Abdomen: Limited view of the liver, kidneys, pancreas are unremarkable. Normal adrenal glands. Multiple gallstones noted   Musculoskeletal: No aggressive osseous lesion.   IMPRESSION: 1. Stable aneurysm of the ascending thoracic aorta measuring 48 mm. 2. 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     Electronically Signed   By: Genevive Bi M.D.   On: 03/03/2024 13:57    A/P:  Ascending Aortic Aneurysm- Type 2 DM HTN HLD OSA Morbid Obesity Abdominal Aortic Aneurysm     Risk Modification:  Statin:  ***  Smoking cessation instruction/counseling given:  {CHL AMB [facility] SMOKING CESSATION COUNSELING:20758}  Patient was counseled on importance of Blood Pressure Control.  Despite Medical intervention if the patient notices persistently elevated blood pressure readings.  They are instructed to contact their Primary Care Physician  Please avoid use of Fluoroquinolones as this can potentially increase your risk of Aortic Rupture and/or Dissection  Patient educated on signs and symptoms of Aortic Dissection, handout also provided in AVS  Jillayne Witte, PA-C 02/26/24

## 2024-02-26 NOTE — Telephone Encounter (Signed)
 Patient with diagnosis of PE on Eliquis for anticoagulation.    Procedure: LUMBAR MEDIAL BRANCH BLOCK  Date of procedure: 03/02/24  CrCl 65 ml/min Platelet count 154  Patient with remote PE several years ago.  Per office protocol, patient can hold Eliquis for 3 days prior to procedure.     **This guidance is not considered finalized until pre-operative APP has relayed final recommendations.**

## 2024-02-26 NOTE — Patient Instructions (Signed)
 Patient is counseled regarding the importance of long term risk factor modification as they pertain to the presence of ischemic heart disease including avoiding the use of all tobacco products, dietary modifications and medical therapy for diabetes, cholesterol and lipid management, and regular exercise.    Make every effort to maintain a "heart-healthy" lifestyle with regular physical exercise and adherence to a low-fat, low-carbohydrate diet.  Continue to seek regular follow-up appointments with your primary care physician and/or cardiologist.   AVOID FLOUROQUINOLONES AS THESE CAN INCREASE YOUR RISK OF AORTIC DISSECTION

## 2024-02-29 ENCOUNTER — Telehealth (INDEPENDENT_AMBULATORY_CARE_PROVIDER_SITE_OTHER)

## 2024-02-29 ENCOUNTER — Telehealth: Payer: Self-pay | Admitting: *Deleted

## 2024-02-29 DIAGNOSIS — I5042 Chronic combined systolic (congestive) and diastolic (congestive) heart failure: Secondary | ICD-10-CM | POA: Diagnosis not present

## 2024-02-29 DIAGNOSIS — Z7901 Long term (current) use of anticoagulants: Secondary | ICD-10-CM | POA: Diagnosis not present

## 2024-02-29 DIAGNOSIS — I2699 Other pulmonary embolism without acute cor pulmonale: Secondary | ICD-10-CM | POA: Diagnosis not present

## 2024-02-29 DIAGNOSIS — Z01810 Encounter for preprocedural cardiovascular examination: Secondary | ICD-10-CM | POA: Diagnosis not present

## 2024-02-29 DIAGNOSIS — H35039 Hypertensive retinopathy, unspecified eye: Secondary | ICD-10-CM | POA: Diagnosis not present

## 2024-02-29 DIAGNOSIS — L309 Dermatitis, unspecified: Secondary | ICD-10-CM | POA: Diagnosis not present

## 2024-02-29 DIAGNOSIS — I130 Hypertensive heart and chronic kidney disease with heart failure and stage 1 through stage 4 chronic kidney disease, or unspecified chronic kidney disease: Secondary | ICD-10-CM | POA: Diagnosis not present

## 2024-02-29 DIAGNOSIS — E785 Hyperlipidemia, unspecified: Secondary | ICD-10-CM | POA: Diagnosis not present

## 2024-02-29 DIAGNOSIS — G4733 Obstructive sleep apnea (adult) (pediatric): Secondary | ICD-10-CM | POA: Diagnosis not present

## 2024-02-29 DIAGNOSIS — H3530 Unspecified macular degeneration: Secondary | ICD-10-CM | POA: Diagnosis not present

## 2024-02-29 DIAGNOSIS — N1831 Chronic kidney disease, stage 3a: Secondary | ICD-10-CM | POA: Diagnosis not present

## 2024-02-29 DIAGNOSIS — D696 Thrombocytopenia, unspecified: Secondary | ICD-10-CM | POA: Diagnosis not present

## 2024-02-29 DIAGNOSIS — E1129 Type 2 diabetes mellitus with other diabetic kidney complication: Secondary | ICD-10-CM | POA: Diagnosis not present

## 2024-02-29 NOTE — Telephone Encounter (Signed)
 Pt has been added to today's Preop tele schedule, per Preop APP.  Consent on file / medications reconciled.

## 2024-02-29 NOTE — Progress Notes (Signed)
 Virtual Visit via Telephone Note   Because of MALCOMB GANGEMI co-morbid illnesses, he is at least at moderate risk for complications without adequate follow up.  This format is felt to be most appropriate for this patient at this time.  Due to technical limitations with video connection (technology), today's appointment will be conducted as an audio only telehealth visit, and Bradley Alvarez verbally agreed to proceed in this manner.   All issues noted in this document were discussed and addressed.  No physical exam could be performed with this format.  Evaluation Performed:  Preoperative cardiovascular risk assessment _____________   Date:  02/29/2024   Patient ID:  Bradley Alvarez, DOB 1941/05/08, MRN [id number] Patient Location:  Home Provider location:   Office  Primary Care Provider:  Creola Corn, MD Primary Cardiologist:  None  Chief Complaint / Patient Profile   83 y.o. y/o male with a h/o AAA, CKD, chronic combined CHF, chronic DOE, OSA on CPAP who is pending lumbar medial branch block and presents today for telephonic preoperative cardiovascular risk assessment.  History of Present Illness    Bradley Alvarez is a 83 y.o. male who presents via audio/video conferencing for a telehealth visit today.  Pt was last seen in cardiology clinic on 11/05/23 by Dr. Rosemary Holms.  At that time Bradley Alvarez was doing well .  The patient is now pending procedure as outlined above. Since his last visit, he feels about the same. He has a lot of back pain when he stands for a short period of time and this is his number one complaint today. No chest pains but some SOB which is chronic. He is on oxygen. He does meet minimum mets requirements.   Per office protocol, patient can hold Eliquis for 3 days prior to procedure. He can restart when medically safe to do so.   Past Medical History    Past Medical History:  Diagnosis Date   AAA (abdominal aortic aneurysm) without rupture (HCC)    followed by  dr Rosalee Kaufman--  per office note dated 11-05-2018  3.5cm   Benign essential HTN    cardiologist-- dr Candis Shine   Chronic combined systolic (congestive) and diastolic (congestive) heart failure [facility])    cardiologist-- dr Arnell Sieving   CKD (chronic kidney disease), stage III (HCC)    Diabetes mellitus type 2, insulin dependent (HCC)    followed by dr Timothy Lasso   Diabetic neuropathy (HCC)    DOE (dyspnea on exertion)    Edema of both lower extremities    Full dentures    HOH (hard of hearing)    does wear hearing aids   Hydrocele, bilateral    Lichen planus    Mixed hyperlipidemia    Morbid obesity with BMI of 45.0-49.9, adult (HCC)    Nocturia    OA (osteoarthritis)    OSA on CPAP 10/14/08   per study  AHI  61.8/hr ,  RDI  96.1/hr.    Past Surgical History:  Procedure Laterality Date   CARDIAC CATHETERIZATION  per pt yrs ago   was told no blockage   CARDIOVASCULAR STRESS TEST  07-19-2014   dr Nicki Guadalajara   Low risk nuclear study w/ small apical attentuation artifact/  normal LV function and wall motion , ef 53%   CATARACT EXTRACTION W/ INTRAOCULAR LENS  IMPLANT, BILATERAL  2018   COLONOSCOPY W/ BIOPSIES AND POLYPECTOMY     COLONOSCOPY WITH PROPOFOL N/A 05/16/2016   Procedure: COLONOSCOPY WITH PROPOFOL;  Surgeon: Jeani Hawking, MD;  Location: [facility];  Service: Endoscopy;  Laterality: N/A;   HYDROCELE EXCISION Bilateral 11/15/2018   Procedure: HYDROCELECTOMY ADULT;  Surgeon: Ihor Gully, MD;  Location: [facility];  Service: Urology;  Laterality: Bilateral;   KNEE ARTHROSCOPY Right 2009   MULTIPLE TOOTH EXTRACTIONS     ORIF ANKLE FRACTURE Left 09/13/2014   Procedure: Open Reduction Internal Fixation Left Ankle Medial Malleolus;  Surgeon: Nadara Mustard, MD;  Location: [facility] OR;  Service: Orthopedics;  Laterality: Left;   PROSTATECTOMY  1993 approx.  by dr Retta Diones   for prostate enlargement   TOTAL KNEE ARTHROPLASTY Right 08/27/2009   dr Thurston Hole @[facility]    TOTAL KNEE  ARTHROPLASTY  12/20/2012   Procedure: TOTAL KNEE ARTHROPLASTY;  Surgeon: Nilda Simmer, MD;  [facility];  Service: Orthopedics;  Laterality: Left;  left total knee arthroplasty   TRANSTHORACIC ECHOCARDIOGRAM  11-04-2018   dr patwardhan   moderate to severe concentric LVH, ef 56%,  grade 1 diastolic dysfunciton/  mild AV calcification annulus and leaflets without stentosis ,  mild regurg./  moderate LAE/ dilated aortic root, 4.5cm/  mild MV annulus calcification with trace regurg./  trace TR    Allergies  No Known Allergies  Home Medications    Prior to Admission medications   Medication Sig Start Date End Date Taking? Authorizing Provider  albuterol (VENTOLIN HFA) 108 (90 Base) MCG/ACT inhaler Inhale 2 puffs into the lungs every 6 (six) hours as needed for wheezing or shortness of breath. 01/04/24   Hunsucker, Lesia Sago, MD  apixaban (ELIQUIS) 5 MG TABS tablet Take 2 tablets (10mg ) twice daily for 7 days, then 1 tablet (5mg ) twice daily 10/02/23   Patwardhan, Manish J, MD  BIDIL 20-37.5 MG tablet Take 2 tablets by mouth 3 (three) times daily. 11/23/23   Patwardhan, Anabel Bene, MD  carvedilol (COREG) 25 MG tablet TAKE TWO TABLETS BY MOUTH TWICE DAILY with meals Patient taking differently: Take 25 mg by mouth 2 (two) times daily with a meal. TAKE TWO TABLETS BY MOUTH TWICE DAILY with meals 05/01/22   Cantwell, Celeste C, PA-C  clobetasol ointment (TEMOVATE) 0.05 % Apply 1 application topically daily as needed (for itching).  09/23/13   [provider]  Cyanocobalamin 2500 MCG TABS Take 500 mcg by mouth daily.     [provider]  diclofenac sodium (VOLTAREN) 1 % GEL Apply 1 application topically 4 (four) times daily as needed (For pain.).    [provider]  Dulaglutide 1.5 MG/0.5ML SOPN Inject 1 Dose into the skin once a week. Wednesday's / PT TAKING 0.75    [provider]  felodipine (PLENDIL) 5 MG 24 hr tablet TAKE TWO TABLETS BY MOUTH DAILY 12/24/21    Yates Decamp, MD  fluticasone-salmeterol (ADVAIR) 250-50 MCG/ACT AEPB Inhale 1 puff into the lungs in the morning and at bedtime. 01/04/24   Hunsucker, Lesia Sago, MD  furosemide (LASIX) 20 MG tablet TAKE ONE TABLET BY MOUTH ONE TIME DAILY *TAKE 1 TAB TWICE DAILY FOR 3-5 DAYS IF INCREASED LEG SWELLING OR SHORTNESS OF BREATH* Patient taking differently: 40 mg. Patient takes 40 mg daily.  Take additional 20 mg 3-5 days if swelling per patient. 04/15/21   Patwardhan, Anabel Bene, MD  HYDROcodone-acetaminophen (NORCO/VICODIN) 5-325 MG tablet 2 (two) times daily. 01/06/19   [provider]  loratadine (CLARITIN) 10 MG tablet Take 10 mg by mouth every morning.     [provider]  losartan (COZAAR) 100 MG  tablet Take 100 mg by mouth every evening.  07/27/17   [provider]  MEGARED OMEGA-3 KRILL OIL PO Take 1 tablet by mouth daily.    [provider]  Multiple Vitamin (MULTIVITAMIN WITH MINERALS) TABS tablet Take 1 tablet by mouth daily.    [provider]  Lum Babe test strip USE TO TEST BID AS DIRECTED 03/20/16   [provider]  Polyethyl Glycol-Propyl Glycol (SYSTANE OP) Apply to eye as needed.    [provider]  potassium chloride SA (K-DUR) 20 MEQ tablet  03/24/19   [provider]  simvastatin (ZOCOR) 40 MG tablet Take 40 mg by mouth every evening.    [provider]  spironolactone (ALDACTONE) 50 MG tablet Take 1 tablet by mouth daily. 08/24/18   [provider]  Turmeric 400 MG CAPS Take by mouth as needed.    [provider]  urea (CARMOL) 10 % cream Apply topically as needed.    [provider]    Physical Exam    Vital Signs:  Bradley Alvarez does not have vital signs available for review today.  Given telephonic nature of communication, physical exam is limited. AAOx3. NAD. Normal affect.  Speech and respirations are unlabored.  Accessory Clinical Findings    None  Assessment &  Plan    1.  Preoperative Cardiovascular Risk Assessment:   Bradley Alvarez perioperative risk of a major cardiac event is 0.9% according to the Revised Cardiac Risk Index (RCRI).  Therefore, he is at low risk for perioperative complications.   His functional capacity is fair at 4.73 METs according to the Duke Activity Status Index (DASI). Recommendations: According to ACC/AHA guidelines, no further cardiovascular testing needed.  The patient may proceed to surgery at acceptable risk.   Antiplatelet and/or Anticoagulation Recommendations:  Eliquis (Apixaban) can be held for 3 days prior to surgery.  Please resume post op when felt to be safe.     The patient was advised that if he develops new symptoms prior to surgery to contact our office to arrange for a follow-up visit, and he verbalized understanding.   A copy of this note will be routed to requesting surgeon.  Time:   Today, I have spent 7 minutes with the patient with telehealth technology discussing medical history, symptoms, and management plan.     Sharlene Dory, PA-C  02/29/2024, 3:03 PM

## 2024-02-29 NOTE — Telephone Encounter (Signed)
 Pt has been added to today's Preop tele schedule, per Preop APP.  Consent on file / medications reconciled.    Patient Consent for Virtual Visit        Bradley Alvarez has provided verbal consent on 02/29/2024 for a virtual visit (video or telephone).   CONSENT FOR VIRTUAL VISIT FOR:  Bradley Alvarez  By participating in this virtual visit I agree to the following:  I hereby voluntarily request, consent and authorize [facility] and its employed or contracted physicians, physician assistants, nurse practitioners or other licensed health care professionals (the Practitioner), to provide me with telemedicine health care services (the "Services") as deemed necessary by the treating Practitioner. I acknowledge and consent to receive the Services by the Practitioner via telemedicine. I understand that the telemedicine visit will involve communicating with the Practitioner through live audiovisual communication technology and the disclosure of certain medical information by electronic transmission. I acknowledge that I have been given the opportunity to request an in-person assessment or other available alternative prior to the telemedicine visit and am voluntarily participating in the telemedicine visit.  I understand that I have the right to withhold or withdraw my consent to the use of telemedicine in the course of my care at any time, without affecting my right to future care or treatment, and that the Practitioner or I may terminate the telemedicine visit at any time. I understand that I have the right to inspect all information obtained and/or recorded in the course of the telemedicine visit and may receive copies of available information for a reasonable fee.  I understand that some of the potential risks of receiving the Services via telemedicine include:  Delay or interruption in medical evaluation due to technological equipment failure or disruption; Information transmitted may not be  sufficient (e.g. poor resolution of images) to allow for appropriate medical decision making by the Practitioner; and/or  In rare instances, security protocols could fail, causing a breach of personal health information.  Furthermore, I acknowledge that it is my responsibility to provide information about my medical history, conditions and care that is complete and accurate to the best of my ability. I acknowledge that Practitioner's advice, recommendations, and/or decision may be based on factors not within their control, such as incomplete or inaccurate data provided by me or distortions of diagnostic images or specimens that may result from electronic transmissions. I understand that the practice of medicine is not an exact science and that Practitioner makes no warranties or guarantees regarding treatment outcomes. I acknowledge that a copy of this consent can be made available to me via my patient portal [facility] MyChart), or I can request a printed copy by calling the office of [facility].    I understand that my insurance will be billed for this visit.   I have read or had this consent read to me. I understand the contents of this consent, which adequately explains the benefits and risks of the Services being provided via telemedicine.  I have been provided ample opportunity to ask questions regarding this consent and the Services and have had my questions answered to my satisfaction. I give my informed consent for the services to be provided through the use of telemedicine in my medical care

## 2024-02-29 NOTE — Telephone Encounter (Signed)
   Name: Bradley Alvarez  DOB: [birth year]  MRN: [id number]  Primary Cardiologist: None  Chart reviewed as part of pre-operative protocol coverage. Because of Braxson C Hoffman's past medical history and time since last visit, he will require a follow-up telephone visit in order to better assess preoperative cardiovascular risk.  Pre-op covering staff: - Please schedule appointment and call patient to inform them. If patient already had an upcoming appointment within acceptable timeframe, please add "pre-op clearance" to the appointment notes so provider is aware. - Please contact requesting surgeon's office via preferred method (i.e, phone, fax) to inform them of need for appointment prior to surgery.  Per office protocol, patient can hold Eliquis for 3 days prior to procedure. Please resume when medically safe to do so.  Sharlene Dory, PA-C  02/29/2024, 7:46 AM

## 2024-03-01 DIAGNOSIS — M47816 Spondylosis without myelopathy or radiculopathy, lumbar region: Secondary | ICD-10-CM | POA: Diagnosis not present

## 2024-03-03 ENCOUNTER — Ambulatory Visit
Admission: RE | Admit: 2024-03-03 | Discharge: 2024-03-03 | Disposition: A | Discharge status: Home / Self Care | Source: Ambulatory Visit | Attending: Surgery | Admitting: Surgery

## 2024-03-03 DIAGNOSIS — I7121 Aneurysm of the ascending aorta, without rupture: Secondary | ICD-10-CM

## 2024-03-10 ENCOUNTER — Ambulatory Visit: Admitting: Physician Assistant

## 2024-03-10 VITALS — BP 101/51 | HR 75 | Resp 20 | Ht 74.0 in | Wt 299.4 lb

## 2024-03-10 DIAGNOSIS — I7121 Aneurysm of the ascending aorta, without rupture: Secondary | ICD-10-CM

## 2024-03-16 DIAGNOSIS — G4733 Obstructive sleep apnea (adult) (pediatric): Secondary | ICD-10-CM | POA: Diagnosis not present

## 2024-03-16 DIAGNOSIS — I2699 Other pulmonary embolism without acute cor pulmonale: Secondary | ICD-10-CM | POA: Diagnosis not present

## 2024-03-29 DIAGNOSIS — M47816 Spondylosis without myelopathy or radiculopathy, lumbar region: Secondary | ICD-10-CM | POA: Diagnosis not present

## 2024-04-07 ENCOUNTER — Ambulatory Visit: Payer: Medicare HMO | Admitting: Pulmonary Disease

## 2024-04-07 ENCOUNTER — Encounter: Payer: Self-pay | Admitting: Pulmonary Disease

## 2024-04-07 VITALS — BP 148/78 | HR 64 | Ht 74.0 in | Wt 301.6 lb

## 2024-04-07 DIAGNOSIS — I2699 Other pulmonary embolism without acute cor pulmonale: Secondary | ICD-10-CM | POA: Diagnosis not present

## 2024-04-07 DIAGNOSIS — G4733 Obstructive sleep apnea (adult) (pediatric): Secondary | ICD-10-CM | POA: Diagnosis not present

## 2024-04-07 MED ORDER — TRELEGY ELLIPTA 200-62.5-25 MCG/ACT IN AEPB: 1.0000 | INHALATION_SPRAY | Freq: Every day | RESPIRATORY_TRACT | Status: DC

## 2024-04-07 NOTE — Patient Instructions (Signed)
 Nice to see you again  Stop the Advair for now, use Trelegy 1 puff once a day.  I provided no samples for 4 weeks.  If this is more beneficial or help your breathing more send to me a message, and I can prescribe this long-term.  The Trelegy would replace the Advair.  If things are no better we would just resume the Advair.  Continue Eliquis   Return to clinic in 6 months or sooner as needed with Dr. Marygrace Snellen

## 2024-04-07 NOTE — Progress Notes (Signed)
 @Patient  ID: Bradley Alvarez, male    DOB: [birth year], 83 y.o.   MRN: [id number]  Chief Complaint  Patient presents with   Follow-up    Referring provider: Margarete Sharps, MD  HPI: 83 y.o., former smoker quit 1973 (15-pack-year history).  Past medical history significant for obstructive sleep apnea, hypertension, pulmonary embolism, coronary artery disease, 1 diastolic dysfunction, thoracic aortic aneurysm, type 2 diabetes, hyperlipidemia, morbid obesity.   Doing fine, denies significant DOE.  Was seen 10/2023 and wheezing.  Concern for asthma exacerbation.  Put on prednisone .  This helped immensely.  Went for PFTs.  Full interpretation below.  Largely normal with restriction.  Chest x-ray showed elevated hemidiaphragm.  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  During PFTs he got 3 puffs of albuterol .  He felt great.  Best his breathing that felt in a while.  Since then his breathing has been very good.  Imaging:  Sniff test 10/2023 elevated right hemidiaphragm that does not move consistent with paralysis  CXR 07/15/21 showed no acute findings  CTA 07/13/21 no central obstructing PE. Acute wedge shaped consolidation abutting the pleural of the right lower lobe with surrounding ground glass opacity, suspicious for pulmonary infarction   No Known Allergies  Immunization History  Administered Date(s) Administered   Fluad Trivalent(High Dose 65+) 08/21/2023   Fluzone Influenza virus vaccine,trivalent (IIV3), split virus 09/02/2010, 08/21/2011, 08/23/2012   Influenza Split 08/19/2013, 08/21/2014   Influenza, Quadrivalent, Recombinant, Inj, Pf 08/24/2018, 07/22/2019, 09/25/2020, 09/09/2021, 09/19/2023   Influenza,inj,Quad PF,6+ Mos 08/18/2014, 08/09/2015   PFIZER(Purple Top)SARS-COV-2 Vaccination 12/24/2019, 01/13/2020, 09/02/2020, 05/13/2021, 06/19/2023   Pneumococcal Conjugate-13 09/19/2013   Tdap 07/13/2012   Zoster, Live 03/07/2014    Past Medical History:  Diagnosis Date    AAA (abdominal aortic aneurysm) without rupture (HCC)    followed by dr Lonny Robertson--  per office note dated 11-05-2018  3.5cm   Benign essential HTN    cardiologist-- dr Etheleen Her   Chronic combined systolic (congestive) and diastolic (congestive) heart failure [facility])    cardiologist-- dr Bobbe Burner   CKD (chronic kidney disease), stage III (HCC)    Diabetes mellitus type 2, insulin  dependent (HCC)    followed by dr Mamie Searles   Diabetic neuropathy (HCC)    DOE (dyspnea on exertion)    Edema of both lower extremities    Full dentures    HOH (hard of hearing)    does wear hearing aids   Hydrocele, bilateral    Lichen planus    Mixed hyperlipidemia    Morbid obesity with BMI of 45.0-49.9, adult (HCC)    Nocturia    OA (osteoarthritis)    OSA on CPAP 10/14/08   per study  AHI  61.8/hr ,  RDI  96.1/hr.     Tobacco History: Social History   Tobacco Use  Smoking Status Former   Current packs/day: 0.00   Average packs/day: 1.5 packs/day for 10.0 years (15.0 ttl pk-yrs)   Types: Cigarettes   Start date: 12/13/1961   Quit date: 12/14/1971   Years since quitting: 52.3  Smokeless Tobacco Former   Types: Snuff, Chew   Counseling given: Not Answered   Outpatient Medications Prior to Visit  Medication Sig Dispense Refill   albuterol  (VENTOLIN  HFA) 108 (90 Base) MCG/ACT inhaler Inhale 2 puffs into the lungs every 6 (six) hours as needed for wheezing or shortness of breath. 1 each 6   apixaban  (ELIQUIS ) 5 MG TABS tablet Take 2 tablets (10mg ) twice daily for 7 days, then 1 tablet (  5mg ) twice daily 180 tablet 3   BIDIL  20-37.5 MG tablet Take 2 tablets by mouth 3 (three) times daily. 180 tablet 9   carvedilol  (COREG ) 25 MG tablet TAKE TWO TABLETS BY MOUTH TWICE DAILY with meals (Patient taking differently: Take 25 mg by mouth 2 (two) times daily with a meal. TAKE TWO TABLETS BY MOUTH TWICE DAILY with meals) 360 tablet 3   clobetasol  ointment (TEMOVATE ) 0.05 % Apply 1 application topically daily  as needed (for itching).      Cyanocobalamin  2500 MCG TABS Take 500 mcg by mouth daily.      diclofenac sodium (VOLTAREN) 1 % GEL Apply 1 application topically 4 (four) times daily as needed (For pain.).     Dulaglutide 1.5 MG/0.5ML SOPN Inject 0.75 Doses into the skin once a week. Wednesday's / PT TAKING 0.75     felodipine  (PLENDIL ) 5 MG 24 hr tablet TAKE TWO TABLETS BY MOUTH DAILY 180 tablet 0   fluticasone -salmeterol (ADVAIR) 250-50 MCG/ACT AEPB Inhale 1 puff into the lungs in the morning and at bedtime. 60 each 5   furosemide  (LASIX ) 20 MG tablet TAKE ONE TABLET BY MOUTH ONE TIME DAILY *TAKE 1 TAB TWICE DAILY FOR 3-5 DAYS IF INCREASED LEG SWELLING OR SHORTNESS OF BREATH* (Patient taking differently: 40 mg. Patient takes 40 mg daily.  Take additional 20 mg 3-5 days if swelling per patient.) 90 tablet 0   HYDROcodone -acetaminophen  (NORCO/VICODIN) 5-325 MG tablet 2 (two) times daily.     loratadine (CLARITIN) 10 MG tablet Take 10 mg by mouth every morning.      losartan  (COZAAR ) 100 MG tablet Take 100 mg by mouth every evening.      MEGARED OMEGA-3 KRILL OIL PO Take 1 tablet by mouth daily.     Multiple Vitamin (MULTIVITAMIN WITH MINERALS) TABS tablet Take 1 tablet by mouth daily.     ONETOUCH VERIO test strip USE TO TEST BID AS DIRECTED  2   Polyethyl Glycol-Propyl Glycol (SYSTANE OP) Apply to eye as needed.     potassium chloride  SA (K-DUR) 20 MEQ tablet Take 20 mEq by mouth daily.     simvastatin  (ZOCOR ) 40 MG tablet Take 40 mg by mouth every evening.     spironolactone  (ALDACTONE ) 50 MG tablet Take 1 tablet by mouth daily.     Turmeric 400 MG CAPS Take by mouth as needed.     urea (CARMOL) 10 % cream Apply topically as needed.     No facility-administered medications prior to visit.      Review of Systems  Review of Systems N/a  Physical Exam  BP (!) 148/78 (BP Location: Left Arm, Cuff Size: Large)   Pulse 64   SpO2 94%  Physical Exam  Neuro: Well-appearing, no acute  distress Eyes: EOMI, no icterus Neck: Supple, no JVP appreciated Pulmonary: Clear, normal work of breathing Cardiovascular: Regular rate and rhythm, no murmurs   Lab Results: Personally reviewed CBC    Component Value Date/Time   WBC 5.7 07/16/2021 0435   RBC 4.29 07/16/2021 0435   HGB 12.0 (L) 07/16/2021 0435   HGB 14.1 09/10/2017 1056   HCT 38.0 (L) 07/16/2021 0435   HCT 42.3 09/10/2017 1056   PLT 181 07/16/2021 0435   PLT 141 (L) 09/10/2017 1056   MCV 88.6 07/16/2021 0435   MCV 81 09/10/2017 1056   MCH 28.0 07/16/2021 0435   MCHC 31.6 07/16/2021 0435   RDW 14.3 07/16/2021 0435   RDW 15.1 09/10/2017 1056   LYMPHSABS  1.6 07/13/2021 1339   MONOABS 1.7 (H) 07/13/2021 1339   EOSABS 0.1 07/13/2021 1339   BASOSABS 0.0 07/13/2021 1339    BMET    Component Value Date/Time   NA 134 (L) 07/14/2021 0122   NA 140 11/09/2020 0806   K 3.5 07/14/2021 0122   CL 97 (L) 07/14/2021 0122   CO2 28 07/14/2021 0122   GLUCOSE 152 (H) 07/14/2021 0122   BUN 17 07/14/2021 0122   BUN 16 11/09/2020 0806   CREATININE 1.29 (H) 07/14/2021 0122   CREATININE 1.14 03/03/2017 1028   CALCIUM 8.4 (L) 07/14/2021 0122   GFRNONAA 56 (L) 07/14/2021 0122   GFRAA 57 (L) 11/09/2020 0806    BNP    Component Value Date/Time   BNP 15.9 07/13/2021 1339    ProBNP No results found for: "PROBNP"  Imaging: Personally reviewed and interpreted as CTA 07/13/2021 with no PE seen right lower lobe wedge-shaped infiltrate concerning for infarct Nuclear medicine perfusion scan 07/15/2021 without evidence of pulmonary embolus  Assessment & Plan:   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   Presumed related to blood clot.  Discussed risk and benefits of ongoing anticoagulation.  Given unprovoked nature would recommend lifelong which he understands.  Continue apixaban  5 mg twice daily. Consider decrease to 2.5 mg BID in future.    OSA on CPAP: continue CPAP, oxygen  bleed in.  He reports good adherence.  Compliance report reviewed, AHI 0.0.  Using every night for multiple hours.  He continues to benefit clinically from ongoing CPAP usage.  Asthma: Wheeze in the past improved with prednisone .  Albuterol  helps.  Advair mid dose Diskus with some improvement.  Still residual shortness of breath.  Escalate to high-dose Trelegy.  Assess response.  If no better would just take with Advair.  Samples provided today.  Return to clinic in 6 months or sooner as needed with Dr. Drury Geralds, MD 04/07/2024

## 2024-04-07 NOTE — Addendum Note (Signed)
 Addended by: Princella Brooklyn R on: 04/07/2024 10:04 AM   Modules accepted: Orders

## 2024-04-12 DIAGNOSIS — H353221 Exudative age-related macular degeneration, left eye, with active choroidal neovascularization: Secondary | ICD-10-CM | POA: Diagnosis not present

## 2024-04-15 DIAGNOSIS — I2699 Other pulmonary embolism without acute cor pulmonale: Secondary | ICD-10-CM | POA: Diagnosis not present

## 2024-04-15 DIAGNOSIS — G4733 Obstructive sleep apnea (adult) (pediatric): Secondary | ICD-10-CM | POA: Diagnosis not present

## 2024-04-27 ENCOUNTER — Other Ambulatory Visit: Payer: Self-pay

## 2024-04-27 ENCOUNTER — Telehealth: Payer: Self-pay

## 2024-04-27 MED ORDER — TRELEGY ELLIPTA 200-62.5-25 MCG/ACT IN AEPB: 1.0000 | INHALATION_SPRAY | Freq: Every day | RESPIRATORY_TRACT | Status: DC

## 2024-04-27 MED ORDER — TRELEGY ELLIPTA 200-62.5-25 MCG/ACT IN AEPB: 1.0000 | INHALATION_SPRAY | Freq: Every day | RESPIRATORY_TRACT | 11 refills | Status: DC

## 2024-04-27 NOTE — Telephone Encounter (Signed)
 Copied from CRM [id number]. Topic: Clinical - Medication Question >> Apr 27, 2024  9:06 AM Hilton Lucky wrote: Reason for CRM: Patient's spouse is calling in to state that Fluticasone -Umeclidin-Vilant (TRELEGY ELLIPTA ) 200-62.5-25 MCG/ACT AEPB is working very well for the patient and would like to notify provider. States red is showing on second prescription, has eight left. Patient is requesting one more sample to tide him over until a prescription can be sent in and approved. Please advise patient.  Requesting rx to be sent to [id number] on [address].    ATC X1. LMTCB. Rx sent to preferred pharmacy and 1 sample of Trelegy 200 is left in the front office for pt. NFN

## 2024-05-16 DIAGNOSIS — G4733 Obstructive sleep apnea (adult) (pediatric): Secondary | ICD-10-CM | POA: Diagnosis not present

## 2024-05-16 DIAGNOSIS — I2699 Other pulmonary embolism without acute cor pulmonale: Secondary | ICD-10-CM | POA: Diagnosis not present

## 2024-05-18 ENCOUNTER — Telehealth (HOSPITAL_BASED_OUTPATIENT_CLINIC_OR_DEPARTMENT_OTHER): Payer: Self-pay

## 2024-05-18 NOTE — Telephone Encounter (Signed)
 Copied from CRM [id number]. Topic: Clinical - Medical Advice >> May 18, 2024 10:38 AM Corean Deutscher wrote: Reason for CRM: Patient wife Marolyn called stating patient is having issues with his medication. Marolyn stated Fluticasone -Umeclidin-Vilant (TRELEGY ELLIPTA ) 200-62.5-25 MCG/ACT AEPB was messing with his vision, his vision became blurry and he is going back to the previous inhaler fluticasone -salmeterol (ADVAIR) 250-50 MCG/ACT AEPB. Marolyn was thankful and verbalized understanding.

## 2024-05-18 NOTE — Telephone Encounter (Signed)
 I agree with the change back to advair - thanks!

## 2024-05-23 DIAGNOSIS — M47896 Other spondylosis, lumbar region: Secondary | ICD-10-CM | POA: Diagnosis not present

## 2024-05-23 DIAGNOSIS — M545 Low back pain, unspecified: Secondary | ICD-10-CM | POA: Diagnosis not present

## 2024-05-24 ENCOUNTER — Telehealth: Payer: Self-pay | Admitting: Cardiology

## 2024-05-24 NOTE — Telephone Encounter (Signed)
 Patient wife stopped to drop off patient assistance 05/24/2024

## 2024-05-26 NOTE — Telephone Encounter (Signed)
 Pt assistance form received in Dr. Gilda mailbox and given to Dr. Elmira to sign and date the MD portion of the application.  Dr. Elmira signed/dated MD portion of the assistance forms.   Will place all completed forms in our patient assistance team mailbox for further management and completion.   Will route this message to our pt assistance team and Dr. Gilda  covering RN, to make them aware of this plan.

## 2024-05-27 ENCOUNTER — Telehealth: Payer: Self-pay | Admitting: Pharmacy Technician

## 2024-05-27 ENCOUNTER — Other Ambulatory Visit (HOSPITAL_COMMUNITY): Payer: Self-pay

## 2024-05-27 NOTE — Telephone Encounter (Signed)
 Will forward this message to Dr. Elmira and covering RN to address next week when they're back in the office, and fax to the contact number provided in this thread.

## 2024-05-27 NOTE — Telephone Encounter (Signed)
 Hi, I got the application but the application requires a hard copy rx sent in with it like you guys done in November 2024. Can you or someone fax a hard copy rx to [phone] or I can come to you and pick it up since I'm in the building today? Thank you!

## 2024-05-27 NOTE — Telephone Encounter (Addendum)
 Provider will be back in the office next week along with his nurse brooke. Went ahead and faxed what we had. Just need to fax hard copy rx when we get next week

## 2024-05-30 ENCOUNTER — Ambulatory Visit: Admitting: Podiatry

## 2024-05-30 DIAGNOSIS — M79674 Pain in right toe(s): Secondary | ICD-10-CM

## 2024-05-30 DIAGNOSIS — M79675 Pain in left toe(s): Secondary | ICD-10-CM

## 2024-05-30 DIAGNOSIS — B351 Tinea unguium: Secondary | ICD-10-CM

## 2024-05-30 DIAGNOSIS — E119 Type 2 diabetes mellitus without complications: Secondary | ICD-10-CM

## 2024-05-30 NOTE — Telephone Encounter (Signed)
 Hey, were you able to get the prescription?

## 2024-05-30 NOTE — Progress Notes (Signed)
This patient returns to my office for at risk foot care.  This patient requires this care by a professional since this patient will be at risk due to having  Diabetes.  This patient is unable to cut nails himself since the patient cannot reach his nails.These nails are painful walking and wearing shoes. Patient requests diabetic shoes. This patient presents for at risk foot care today.  General Appearance  Alert, conversant and in no acute stress.  Vascular  Dorsalis pedis  are  weakly  palpable  bilaterally.  Posterior tibial pulses are absent due to ankle swelling.  Absent pedal hair.Capillary return is within normal limits  bilaterally. Cold feet bilaterally.  Neurologic  Senn-Weinstein monofilament wire test diminished  bilaterally. Muscle power within normal limits bilaterally.  Nails Thick disfigured discolored nails with subungual debris  from hallux to fifth toes bilaterally. No evidence of bacterial infection or drainage bilaterally.  Orthopedic  No limitations of motion  feet .  No crepitus or effusions noted.  No bony pathology or digital deformities noted. HAV  B/L.  PTT tendinitis/arthritis left foot.  Pes planus  Skin  normotropic skin with no porokeratosis noted bilaterally.  No signs of infections or ulcers noted.     Onychomycosis  Pain in right toes  Pain in left toes  Consent was obtained for treatment procedures.   Mechanical debridement of nails 1-5  bilaterally performed with a nail nipper.  Filed with dremel without incident.    Return office visit   3 months                   Told patient to return for periodic foot care and evaluation due to potential at risk complications.   Helane Gunther DPM

## 2024-05-31 ENCOUNTER — Encounter (HOSPITAL_BASED_OUTPATIENT_CLINIC_OR_DEPARTMENT_OTHER): Payer: Self-pay | Admitting: Emergency Medicine

## 2024-05-31 ENCOUNTER — Other Ambulatory Visit: Payer: Self-pay

## 2024-05-31 ENCOUNTER — Emergency Department (HOSPITAL_BASED_OUTPATIENT_CLINIC_OR_DEPARTMENT_OTHER)

## 2024-05-31 ENCOUNTER — Emergency Department (HOSPITAL_BASED_OUTPATIENT_CLINIC_OR_DEPARTMENT_OTHER): Admission: EM | Admit: 2024-05-31 | Discharge: 2024-06-01 | Disposition: A | Discharge status: Home / Self Care

## 2024-05-31 DIAGNOSIS — R0602 Shortness of breath: Secondary | ICD-10-CM | POA: Diagnosis not present

## 2024-05-31 DIAGNOSIS — R1013 Epigastric pain: Secondary | ICD-10-CM | POA: Insufficient documentation

## 2024-05-31 DIAGNOSIS — Z794 Long term (current) use of insulin: Secondary | ICD-10-CM | POA: Insufficient documentation

## 2024-05-31 DIAGNOSIS — R0789 Other chest pain: Secondary | ICD-10-CM | POA: Diagnosis not present

## 2024-05-31 DIAGNOSIS — Z7901 Long term (current) use of anticoagulants: Secondary | ICD-10-CM | POA: Insufficient documentation

## 2024-05-31 DIAGNOSIS — R9389 Abnormal findings on diagnostic imaging of other specified body structures: Secondary | ICD-10-CM | POA: Diagnosis not present

## 2024-05-31 DIAGNOSIS — R918 Other nonspecific abnormal finding of lung field: Secondary | ICD-10-CM | POA: Diagnosis not present

## 2024-05-31 DIAGNOSIS — R079 Chest pain, unspecified: Secondary | ICD-10-CM | POA: Diagnosis not present

## 2024-05-31 LAB — CBC
HCT: 29.1 % — ABNORMAL LOW (ref 39.0–52.0)
Hemoglobin: 8.6 g/dL — ABNORMAL LOW (ref 13.0–17.0)
MCH: 22.6 pg — ABNORMAL LOW (ref 26.0–34.0)
MCHC: 29.6 g/dL — ABNORMAL LOW (ref 30.0–36.0)
MCV: 76.4 fL — ABNORMAL LOW (ref 80.0–100.0)
Platelets: 237 10*3/uL (ref 150–400)
RBC: 3.81 MIL/uL — ABNORMAL LOW (ref 4.22–5.81)
RDW: 17.7 % — ABNORMAL HIGH (ref 11.5–15.5)
WBC: 7.7 10*3/uL (ref 4.0–10.5)
nRBC: 0 % (ref 0.0–0.2)

## 2024-05-31 LAB — HEPATIC FUNCTION PANEL
ALT: 14 U/L (ref 0–44)
AST: 24 U/L (ref 15–41)
Albumin: 3.8 g/dL (ref 3.5–5.0)
Alkaline Phosphatase: 56 U/L (ref 38–126)
Bilirubin, Direct: 0.1 mg/dL (ref 0.0–0.2)
Indirect Bilirubin: 0.1 mg/dL — ABNORMAL LOW (ref 0.3–0.9)
Total Bilirubin: 0.3 mg/dL (ref 0.0–1.2)
Total Protein: 7.1 g/dL (ref 6.5–8.1)

## 2024-05-31 LAB — BASIC METABOLIC PANEL WITH GFR
Anion gap: 9 (ref 5–15)
BUN: 22 mg/dL (ref 8–23)
CO2: 25 mmol/L (ref 22–32)
Calcium: 8.8 mg/dL — ABNORMAL LOW (ref 8.9–10.3)
Chloride: 96 mmol/L — ABNORMAL LOW (ref 98–111)
Creatinine, Ser: 1.64 mg/dL — ABNORMAL HIGH (ref 0.61–1.24)
GFR, Estimated: 41 mL/min — ABNORMAL LOW (ref >60)
Glucose, Bld: 155 mg/dL — ABNORMAL HIGH (ref 70–99)
Potassium: 5.2 mmol/L — ABNORMAL HIGH (ref 3.5–5.1)
Sodium: 130 mmol/L — ABNORMAL LOW (ref 135–145)

## 2024-05-31 LAB — LIPASE, BLOOD: Lipase: 35 U/L (ref 11–51)

## 2024-05-31 LAB — TROPONIN T, HIGH SENSITIVITY
Troponin T High Sensitivity: 38 ng/L — ABNORMAL HIGH (ref <19)
Troponin T High Sensitivity: 36 ng/L — ABNORMAL HIGH (ref <19)

## 2024-05-31 MED ORDER — BIDIL 20-37.5 MG PO TABS: 2.0000 | ORAL_TABLET | Freq: Three times a day (TID) | ORAL | 3 refills | Status: DC

## 2024-05-31 MED ORDER — ALUM & MAG HYDROXIDE-SIMETH 200-200-20 MG/5ML PO SUSP
30.0000 mL | Freq: Once | ORAL | Status: AC
  Administered 2024-05-31: 30 mL via ORAL
  Filled 2024-05-31: qty 30

## 2024-05-31 NOTE — ED Triage Notes (Addendum)
 Central chest pain, started around 8pm Always some SOB Little nausea Took 81mg  asa  I think it might indigestion I ate too fast Better after drink ginger ale and asa  I has eased off some   Hx PE on eliquis  reports compliance with med  O2 97% on RA , uses 2 L [place] PRN at home

## 2024-05-31 NOTE — Addendum Note (Signed)
 Addended by: GLADIS PORTER HERO on: 05/31/2024 09:25 AM   Modules accepted: Orders

## 2024-05-31 NOTE — Telephone Encounter (Signed)
 Faxed patient application and prescription to [employer]  assistance

## 2024-05-31 NOTE — Addendum Note (Signed)
 Addended by: MANDA BOTTCHER B on: 05/31/2024 09:15 AM   Modules accepted: Orders

## 2024-05-31 NOTE — Telephone Encounter (Signed)
 Faxed to assistance company

## 2024-05-31 NOTE — ED Provider Notes (Addendum)
 [place] [facility]ERGENCY DEPARTMENT AT [facility] Provider Note   CSN: [id number] Arrival date & time: 05/31/24  2038     Patient presents with: Chest Pain   MARQUEE Alvarez is a 83 y.o. male.   This is an 83 year old male presenting emergency department with chest pain.  Reports eating shrimp and oysters, lay down took a nap and when he awoke he had some epigastric discomfort.  Felt better after  drank some ginger ale, took some aspirin  and burped several times.  He had a little bit of nausea, that has resolved.  Denies worsening shortness of breath.   Chest Pain      Prior to Admission medications   Medication Sig Start Date End Date Taking? Authorizing Provider  albuterol  (VENTOLIN  HFA) 108 (90 Base) MCG/ACT inhaler Inhale 2 puffs into the lungs every 6 (six) hours as needed for wheezing or shortness of breath. 01/04/24   Hunsucker, Donnice SAUNDERS, MD  apixaban  (ELIQUIS ) 5 MG TABS tablet Take 2 tablets (10mg ) twice daily for 7 days, then 1 tablet (5mg ) twice daily 10/02/23   Patwardhan, Newman PARAS, MD  BIDIL  20-37.5 MG tablet Take 2 tablets by mouth 3 (three) times daily. 05/31/24   Ladona Heinz, MD  carvedilol  (COREG ) 25 MG tablet TAKE TWO TABLETS BY MOUTH TWICE DAILY with meals Patient taking differently: Take 25 mg by mouth 2 (two) times daily with a meal. TAKE TWO TABLETS BY MOUTH TWICE DAILY with meals 05/01/22   Cantwell, Celeste C, PA-C  clobetasol  ointment (TEMOVATE ) 0.05 % Apply 1 application topically daily as needed (for itching).  09/23/13   [provider]  Cyanocobalamin  2500 MCG TABS Take 500 mcg by mouth daily.     [provider]  diclofenac sodium (VOLTAREN) 1 % GEL Apply 1 application topically 4 (four) times daily as needed (For pain.).    [provider]  Dulaglutide 1.5 MG/0.5ML SOPN Inject 0.75 Doses into the skin once a week. Wednesday's / PT TAKING 0.75    [provider]  felodipine  (PLENDIL ) 5 MG 24 hr tablet TAKE TWO TABLETS BY  MOUTH DAILY 12/24/21   Ladona Heinz, MD  fluticasone -salmeterol (ADVAIR) 250-50 MCG/ACT AEPB Inhale 1 puff into the lungs in the morning and at bedtime. 01/04/24   Hunsucker, Donnice SAUNDERS, MD  Fluticasone -Umeclidin-Vilant (TRELEGY ELLIPTA ) 200-62.5-25 MCG/ACT AEPB Inhale 1 puff into the lungs daily at 2 PM. 04/27/24   Hunsucker, Donnice SAUNDERS, MD  Fluticasone -Umeclidin-Vilant (TRELEGY ELLIPTA ) 200-62.5-25 MCG/ACT AEPB Inhale 1 puff into the lungs daily. 04/27/24   Hunsucker, Donnice SAUNDERS, MD  furosemide  (LASIX ) 20 MG tablet TAKE ONE TABLET BY MOUTH ONE TIME DAILY *TAKE 1 TAB TWICE DAILY FOR 3-5 DAYS IF INCREASED LEG SWELLING OR SHORTNESS OF BREATH* Patient taking differently: 40 mg. Patient takes 40 mg daily.  Take additional 20 mg 3-5 days if swelling per patient. 04/15/21   Patwardhan, Newman PARAS, MD  HYDROcodone -acetaminophen  (NORCO/VICODIN) 5-325 MG tablet 2 (two) times daily. 01/06/19   [provider]  loratadine (CLARITIN) 10 MG tablet Take 10 mg by mouth every morning.     [provider]  losartan  (COZAAR ) 100 MG tablet Take 100 mg by mouth every evening.  07/27/17   [provider]  MEGARED OMEGA-3 KRILL OIL PO Take 1 tablet by mouth daily.    [provider]  Multiple Vitamin (MULTIVITAMIN WITH MINERALS) TABS tablet Take 1 tablet by mouth daily.    [provider]  [facility] VERIO test strip USE TO TEST BID  AS DIRECTED 03/20/16   [provider]  Polyethyl Glycol-Propyl Glycol (SYSTANE OP) Apply to eye as needed.    [provider]  potassium chloride  SA (K-DUR) 20 MEQ tablet Take 20 mEq by mouth daily. 03/24/19   [provider]  simvastatin  (ZOCOR ) 40 MG tablet Take 40 mg by mouth every evening.    [provider]  spironolactone  (ALDACTONE ) 50 MG tablet Take 1 tablet by mouth daily. 08/24/18   [provider]  Turmeric 400 MG CAPS Take by mouth as needed.    [provider]  urea (CARMOL) 10 % cream Apply  topically as needed.    [provider]    Allergies: Patient has no known allergies.    Review of Systems  Cardiovascular:  Positive for chest pain.    Updated Vital Signs BP 119/67   Pulse 63   Temp 98.5 F (36.9 C) (Oral)   Resp 12   SpO2 96%   Physical Exam Vitals and nursing note reviewed.  Constitutional:      General: He is not in acute distress.    Appearance: He is not toxic-appearing.  Cardiovascular:     Rate and Rhythm: Normal rate and regular rhythm.     Heart sounds: Normal heart sounds.  Pulmonary:     Effort: Pulmonary effort is normal.     Breath sounds: Normal breath sounds.  Abdominal:     General: Abdomen is flat. There is no distension.     Palpations: Abdomen is soft.     Tenderness: There is no abdominal tenderness. There is no guarding or rebound.  Musculoskeletal:     Right lower leg: No edema.     Left lower leg: No edema.  Skin:    General: Skin is warm and dry.     Capillary Refill: Capillary refill takes less than 2 seconds.  Neurological:     Mental Status: He is alert.  Psychiatric:        Mood and Affect: Mood normal.        Behavior: Behavior normal.     (all labs ordered are listed, but only abnormal results are displayed) Labs Reviewed  BASIC METABOLIC PANEL WITH GFR - Abnormal; Notable for the following components:      Result Value   Sodium 130 (*)    Potassium 5.2 (*)    Chloride 96 (*)    Glucose, Bld 155 (*)    Creatinine, Ser 1.64 (*)    Calcium 8.8 (*)    GFR, Estimated 41 (*)    All other components within normal limits  CBC - Abnormal; Notable for the following components:   RBC 3.81 (*)    Hemoglobin 8.6 (*)    HCT 29.1 (*)    MCV 76.4 (*)    MCH 22.6 (*)    MCHC 29.6 (*)    RDW 17.7 (*)    All other components within normal limits  HEPATIC FUNCTION PANEL - Abnormal; Notable for the following components:   Indirect Bilirubin 0.1 (*)    All other components within normal limits  TROPONIN T, HIGH  SENSITIVITY - Abnormal; Notable for the following components:   Troponin T High Sensitivity 38 (*)    All other components within normal limits  TROPONIN T, HIGH SENSITIVITY - Abnormal; Notable for the following components:   Troponin T High Sensitivity 36 (*)    All other components within normal limits  LIPASE, BLOOD    EKG: None  Radiology:  DG Chest Port 1 View Result Date: 05/31/2024 CLINICAL DATA:  Chest pain and shortness of breath. EXAM: PORTABLE CHEST 1 VIEW COMPARISON:  July 15, 2021 FINDINGS: The heart size and mediastinal contours are within normal limits. Low lung volumes are noted with mild, stable elevation of the right hemidiaphragm. Mild right basilar scarring and/or atelectasis is seen. No pleural effusion or pneumothorax is identified. No acute osseous abnormality is identified. IMPRESSION: Low lung volumes with mild right basilar scarring and/or atelectasis. Electronically Signed   By: Suzen Dials M.D.   On: 05/31/2024 21:18     Procedures   Medications Ordered in the ED  alum & mag hydroxide-simeth (MAALOX/MYLANTA) 200-200-20 MG/5ML suspension 30 mL (30 mLs Oral Given 05/31/24 2156)    Clinical Course as of 05/31/24 2340  Tue May 31, 2024  2128 CBC(!) Patient is anemic.  Last set of labs however 2 years ago.  Unclear what baseline anemia is.  It is microcytic which would suggest some chronicity to it [TY]    Clinical Course User Index [TY] Neysa Caron PARAS, DO                                 Medical Decision Making This is an 83 year old male presenting emergency department for epigastric/chest pain.  He is afebrile nontachycardic, hemodynamically stable, maintaining oxygen  saturation in the upper 90s on room air.SABRA  Physical exam reassuring.  EKG without ST segment changes to indicate ischemia.  Normal intervals.  Chest x-ray without pneumonia or pneumothorax on my open interpretation.  Liver enzymes and lipase normal.  Initial troponin mildly elevated at  28.  Awaiting repeat troponin.   Update; patient's second  troponin are flat.  He is chest pain-free.  ACS unlikely.  He does have some low sodium.  Minor elevation in his creatinine, reports history of CKD.  He would like to go home.  Discussed follow-up with primary doctor regarding his electrolyte abnormalities.  Will discharge in stable condition.  Amount and/or Complexity of Data Reviewed External Data Reviewed:     Details: Normal EF 08/2023 echo Labs: ordered. Decision-making details documented in ED Course. Radiology: ordered and independent interpretation performed. ECG/medicine tests: independent interpretation performed.  Risk OTC drugs. Decision regarding hospitalization. Diagnosis or treatment significantly limited by social determinants of health.      Final diagnoses:  Chest pain, unspecified type    ED Discharge Orders     None          Neysa Caron PARAS, DO 05/31/24 2326    Neysa Caron PARAS, DO 05/31/24 2340

## 2024-05-31 NOTE — Telephone Encounter (Signed)
 Prescription has been printed, signed, and faxed.

## 2024-06-06 ENCOUNTER — Encounter: Payer: Self-pay | Admitting: Cardiology

## 2024-06-06 ENCOUNTER — Ambulatory Visit: Attending: Cardiology | Admitting: Cardiology

## 2024-06-06 VITALS — BP 94/60 | HR 70 | Ht 74.0 in | Wt 299.6 lb

## 2024-06-06 DIAGNOSIS — I7121 Aneurysm of the ascending aorta, without rupture: Secondary | ICD-10-CM | POA: Diagnosis not present

## 2024-06-06 DIAGNOSIS — R072 Precordial pain: Secondary | ICD-10-CM | POA: Diagnosis not present

## 2024-06-06 DIAGNOSIS — G4733 Obstructive sleep apnea (adult) (pediatric): Secondary | ICD-10-CM | POA: Diagnosis not present

## 2024-06-06 DIAGNOSIS — I10 Essential (primary) hypertension: Secondary | ICD-10-CM | POA: Diagnosis not present

## 2024-06-06 DIAGNOSIS — D649 Anemia, unspecified: Secondary | ICD-10-CM | POA: Diagnosis not present

## 2024-06-06 MED ORDER — SPIRONOLACTONE 50 MG PO TABS: 25.0000 mg | ORAL_TABLET | Freq: Every day | ORAL | Status: AC

## 2024-06-06 MED ORDER — FUROSEMIDE 40 MG PO TABS: 40.0000 mg | ORAL_TABLET | ORAL | 1 refills | Status: DC | PRN

## 2024-06-06 NOTE — Patient Instructions (Addendum)
 Medication Instructions:  DECREASE Spironolactone  to 50 mg take 0.5 tablet daily  CHANGE Furosemide  to 40 mg take one tablet daily as needed for leg edema   *If you need a refill on your cardiac medications before your next appointment, please call your pharmacy*  Follow-Up: At [facility], you and your health needs are our priority.  As part of our continuing mission to provide you with exceptional heart care, our providers are all part of one team.  This team includes your primary Cardiologist (physician) and Advanced Practice Providers or APPs (Physician Assistants and Nurse Practitioners) who all work together to provide you with the care you need, when you need it.  Your next appointment:   3 month(s)  Provider:   Newman JINNY Lawrence, MD

## 2024-06-06 NOTE — Progress Notes (Signed)
 Cardiology Office Note:  .   Date:  06/06/2024  ID:  Bradley Alvarez, DOB 1941-04-28, MRN [id number] PCP: [name], MD  [facility] Providers Cardiologist:  Newman Lawrence, MD PCP: [name], MD  Chief Complaint  Patient presents with   Chest pain      History of Present Illness: .    Bradley Alvarez is a 83 y.o. 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  Reportedly, pain started after he ate too many other stress to past.  Pain lasted for almost 1 day, improved with Maalox.  He was seen in the ER.  EKG did not show any acute ischemic changes.  Troponin was mildly elevated but flat at 36-38.  Hemoglobin was markedly reduced at 8.6.  He denies any melena, hematochezia symptoms.  Blood pressure is low today.  He has continued exertional dyspnea symptoms with no worsening recently.  He has trouble with his back pain.  He is open to undergo epidural injections in the near future.    Vitals:   06/06/24 0941  BP: 94/60  Pulse: 70  SpO2: 95%      ROS:  Review of Systems  Cardiovascular:  Positive for dyspnea on exertion (Stable, unchanged). Negative for chest pain, leg swelling, palpitations and syncope.  Neurological:  Negative for light-headedness.     Studies Reviewed: SABRA        Independently interpreted 05/31/2024: Hb 8.6 Cr 1.64, eGFR 41 Trop HS 38, 26  Latest Reference Range & Units 07/16/21 04:35 05/31/24 20:54  Hemoglobin 13.0 - 17.0 g/dL 87.9 (L) 8.6 (L)  (L): Data is abnormally low  CT chest 03/2024: 1. Stable aneurysm of the ascending thoracic aorta measuring 48 mm. 2. 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  CT chest 10/21/2023: 1. Aneurysmal dilatation of the thoracic aorta, measuring up to 4.8 cm in the ascending aorta, previously 4.7 cm. Ascending thoracic aortic aneurysm.  Recommend semi-annual imaging followup by CTA or MRA and referral to cardiothoracic surgery if not already obtained. This recommendation follows 2010 ACCF/AHA/AATS/ACR/ASA/SCA/SCAI/SIR/STS/SVM Guidelines for the Diagnosis and Management of Patients With Thoracic Aortic Disease. Circulation. 2010; 121: Z733-z630. Aortic aneurysm NOS (ICD10-I71.9) 2. Aneurysmal dilatation of the distal arch has mildly progressed from prior, currently 4.3 cm, previously 4.1 cm. Attention at follow-up recommended. 3. Dilated main pulmonary artery, can be seen with pulmonary arterial hypertension. 4. Elevated right hemidiaphragm with adjacent atelectasis/scarring. 5. Cholelithiasis without cholecystitis. 6. Borderline hepatic steatosis.   Aortic Atherosclerosis (ICD10-I70.0).   Abdominal Aortic Duplex 08/14/2023:  There is ectasia of the proximal abdominal aorta measuring at the greatest  diameter of 2.7 cm.  No AAA noted. There is diffuse heterogeneous plaque noted int he abdominal  aorta.   Normal bilateral CIA size and flow velocity.  Compared to 09/21/2020, 3 cm AAA not present. Further studies if  clinically indicated.    Physical Exam:   Physical Exam Vitals and nursing note reviewed.  Constitutional:      General: He is not in acute distress.    Comments: On supplemental oxygen   Neck:     Vascular: No JVD.  Cardiovascular:     Rate and Rhythm: Normal rate and regular rhythm.     Heart sounds: Normal heart sounds. No murmur heard. Pulmonary:     Effort: Pulmonary effort is normal.     Breath  sounds: Normal breath sounds. No wheezing or rales.  Musculoskeletal:     Right lower leg: No edema.     Left lower leg: No edema.      VISIT DIAGNOSES:   ICD-10-CM   1. Aneurysm of ascending aorta without rupture (HCC)  I71.21     2. Precordial pain  R07.2     3. Essential hypertension  I10         ASSESSMENT AND PLAN: .    Bradley Alvarez is a 83 y.o. 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  EKG without any ischemia. High sensitive troponin mildly elevated, not indicative of ACS. Overall suspicion for this being related to ischemia is fairly low. He does have significant anemia for which Dr. Onita has done some workup with iron studies.  Concern would be if he has any more sinister cause for blood loss anemia such as stomach ulcer etc.  I will defer further workup to Dr. Onita. Okay to hold off ischemia testing at this time unless he has clear anginal chest pain symptoms in the future.  I think he reasonable to proceed with his upcoming epidural injections without further cardiac testing given his severe symptoms of lifestyle limiting back pain.  I will defer Eliquis  management perioperatively to pulmonology given his history of PE.  Primary hypertension: Blood pressure is low today.  Reduce spironolactone  from 50 mg daily to 25 mg daily, and stop Lasix  daily use and take it only as needed.  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 (03/2024). Annual imaging and f/u w/Dr. Lucas.   Continue losartan , carvedilol  No AAA noted on recent ultrasound duplex 03/2023.   Coronary artery disease: Seen on CTA No angina symptoms at this time. In presence of eliquis  for PE treatment, do not recommend aspirin .   Lipids well-controlled.  Moderate asthma, elevated right hemidiaphragm: On supplemental oxygen , management as per pulmonology    F/u in 3 months  Signed, Newman JINNY Lawrence, MD

## 2024-06-07 ENCOUNTER — Encounter: Payer: Self-pay | Admitting: Pharmacy Technician

## 2024-06-07 NOTE — Telephone Encounter (Signed)
 I called [phone] to check on application

## 2024-06-07 NOTE — Telephone Encounter (Signed)
 They said he was approved from 05/31/2024 until 05/31/2025  Next shipment 08/09/24

## 2024-06-13 ENCOUNTER — Other Ambulatory Visit: Payer: Self-pay | Admitting: Gastroenterology

## 2024-06-13 ENCOUNTER — Telehealth: Payer: Self-pay | Admitting: *Deleted

## 2024-06-13 DIAGNOSIS — D509 Iron deficiency anemia, unspecified: Secondary | ICD-10-CM | POA: Diagnosis not present

## 2024-06-13 DIAGNOSIS — Z860100 Personal history of colon polyps, unspecified: Secondary | ICD-10-CM | POA: Diagnosis not present

## 2024-06-13 DIAGNOSIS — R1013 Epigastric pain: Secondary | ICD-10-CM | POA: Diagnosis not present

## 2024-06-13 DIAGNOSIS — I2699 Other pulmonary embolism without acute cor pulmonale: Secondary | ICD-10-CM | POA: Diagnosis not present

## 2024-06-13 NOTE — Telephone Encounter (Signed)
 Low risk for EGD/colonoscopy. Okay to proceed/  Thanks MJP

## 2024-06-13 NOTE — Telephone Encounter (Signed)
   Pre-operative Risk Assessment    Patient Name: Bradley Alvarez  DOB: June 01, 1941 MRN: [id number]   Date of last office visit: 06/06/24 DR. PATWARDHAN Date of next office visit: 09/01/24 DR. PATWARDHAN   Request for Surgical Clearance    Procedure:  EGD/COLONOSCOPY  Date of Surgery:  Clearance 07/01/24                                Surgeon:  DR. HUNG Surgeon's Group or Practice Name:  [facility] Phone number:  [phone] Fax number:  [phone]   Type of Clearance Requested:   - Medical  - Pharmacy:  Hold Apixaban  (Eliquis )     Type of Anesthesia:  PROPOFOL    Additional requests/questions:    Bonney Niels Jest   06/13/2024, 10:46 AM

## 2024-06-13 NOTE — Telephone Encounter (Signed)
 Dr. Elmira,  You recently saw Mr. Jurgens in office on 06/06/24, he is now pending EGD/colonoscopy on 07/01/24. Are you able to advise on cardiac risk for procedure? Please route your response to P CV DIV PREOP. Thank you!

## 2024-06-14 ENCOUNTER — Telehealth: Payer: Self-pay

## 2024-06-14 NOTE — Telephone Encounter (Signed)
 Received surgical assessment form from [facility] medical center. Pt is scheduled for EGD and colonoscopy 07/01/2024. Pt last seen 04/07/2024. Office protocol is that patient must be seen within 60 days of surgery.   Spoke to patient's spouse, Marlyn(DPR) and made her aware of need for appt.  Appt scheduled 06/24/2024 at 10:45.

## 2024-06-14 NOTE — Telephone Encounter (Signed)
 Pt with pulmonary embolism on Eliquis  - followed by pulmonology.   Please send clearance request to that office.

## 2024-06-14 NOTE — Telephone Encounter (Signed)
   Patient Name: Bradley Alvarez  DOB: Nov 14, 1941 MRN: [id number]  Primary Cardiologist: Newman JINNY Lawrence, MD  Chart reviewed as part of pre-operative protocol coverage. Given past medical history and time since last visit, based on ACC/AHA guidelines, Bradley Alvarez is at acceptable risk for the planned procedure without further cardiovascular testing. Per Dr. Lawrence patient is low risk for EGD/colonoscopy, okay to proceed.   Patient with a history of pulmonary embolism on Eliquis , he is followed by pulmonology.  Per Pharm.D. recommend sending clearance request to pulmonology office for recommendations regarding holding of Eliquis  given history of pulmonary embolism.  I will route this recommendation to the requesting party via Epic fax function and remove from pre-op pool.  Please call with questions.  Tierney Behl D Eleina Jergens, NP 06/14/2024, 7:39 AM

## 2024-06-15 DIAGNOSIS — I2699 Other pulmonary embolism without acute cor pulmonale: Secondary | ICD-10-CM | POA: Diagnosis not present

## 2024-06-15 DIAGNOSIS — G4733 Obstructive sleep apnea (adult) (pediatric): Secondary | ICD-10-CM | POA: Diagnosis not present

## 2024-06-16 DIAGNOSIS — E7849 Other hyperlipidemia: Secondary | ICD-10-CM | POA: Diagnosis not present

## 2024-06-16 DIAGNOSIS — D649 Anemia, unspecified: Secondary | ICD-10-CM | POA: Diagnosis not present

## 2024-06-20 ENCOUNTER — Telehealth: Payer: Self-pay | Admitting: *Deleted

## 2024-06-20 DIAGNOSIS — R82998 Other abnormal findings in urine: Secondary | ICD-10-CM | POA: Diagnosis not present

## 2024-06-20 DIAGNOSIS — D649 Anemia, unspecified: Secondary | ICD-10-CM | POA: Diagnosis not present

## 2024-06-20 NOTE — Telephone Encounter (Signed)
   Pre-operative Risk Assessment    Patient Name: Bradley Alvarez  DOB: [birth year] MRN: [id number]   Date of last office visit: 06/06/24 DR. PATWARDHAN Date of next office visit: 09/01/24 DR. PATWARDHAN   Request for Surgical Clearance    Procedure:  L3, L4, L5 RADIO FREQUENCY ABLATION   Date of Surgery:  Clearance TBD                                Surgeon:  DR. LAQUETA Surgeon's Group or Practice Name:  [facility] Phone number:  [phone] EXT [address] Fax number:  [phone]   Type of Clearance Requested:   - Medical  - Pharmacy:  Hold Apixaban  (Eliquis )     Type of Anesthesia:  Not Indicated   Additional requests/questions:    Bonney Niels Jest   06/20/2024, 3:51 PM

## 2024-06-21 DIAGNOSIS — H353221 Exudative age-related macular degeneration, left eye, with active choroidal neovascularization: Secondary | ICD-10-CM | POA: Diagnosis not present

## 2024-06-21 NOTE — Telephone Encounter (Signed)
   Primary Cardiologist: Newman JINNY Lawrence, MD  Chart reviewed as part of pre-operative protocol coverage. Given past medical history and time since last visit, based on ACC/AHA guidelines, [name] would be at acceptable risk for the planned procedure without further cardiovascular testing.   Patient should contact our office if he is having new symptoms that are concerning from a cardiac perspective to arrange a follow-up appointment.    Patient with a history of pulmonary embolism on Eliquis , he is followed by pulmonology.  Per Pharm.D. recommend sending clearance request to pulmonology office for recommendations regarding holding of Eliquis  given history of pulmonary embolism.   I will route this recommendation to the requesting party via Epic fax function and remove from pre-op pool.  Please call with questions.  Rosaline EMERSON Bane, NP-C  06/21/2024, 11:08 AM [address], [address] [place], [place] [address] Office [phone] Fax [phone]

## 2024-06-22 NOTE — Telephone Encounter (Addendum)
 DISREGARD

## 2024-06-23 DIAGNOSIS — Z1339 Encounter for screening examination for other mental health and behavioral disorders: Secondary | ICD-10-CM | POA: Diagnosis not present

## 2024-06-23 DIAGNOSIS — Z1331 Encounter for screening for depression: Secondary | ICD-10-CM | POA: Diagnosis not present

## 2024-06-23 DIAGNOSIS — Z7901 Long term (current) use of anticoagulants: Secondary | ICD-10-CM | POA: Diagnosis not present

## 2024-06-23 DIAGNOSIS — Z0000 Encounter for general adult medical examination without abnormal findings: Secondary | ICD-10-CM | POA: Diagnosis not present

## 2024-06-24 ENCOUNTER — Encounter (HOSPITAL_COMMUNITY): Payer: Self-pay | Admitting: Gastroenterology

## 2024-06-24 ENCOUNTER — Encounter: Payer: Self-pay | Admitting: Pulmonary Disease

## 2024-06-24 ENCOUNTER — Ambulatory Visit: Admitting: Pulmonary Disease

## 2024-06-24 VITALS — BP 99/62 | HR 75 | Temp 99.4°F | Ht 74.0 in | Wt 303.5 lb

## 2024-06-24 DIAGNOSIS — G4733 Obstructive sleep apnea (adult) (pediatric): Secondary | ICD-10-CM | POA: Diagnosis not present

## 2024-06-24 NOTE — Progress Notes (Signed)
 Attempted to obtain medical history for pre op call via telephone, unable to reach at this time. HIPAA compliant voicemail message left requesting return call to pre surgical testing department.

## 2024-06-24 NOTE — Telephone Encounter (Signed)
 Risk assessment done 06/24/24 has been faxed to [facility] [phone].

## 2024-06-24 NOTE — Progress Notes (Signed)
 @Patient  ID: Bradley Alvarez, male    DOB: [birth year], 83 y.o.   MRN: [id number]  Chief Complaint  Patient presents with   Medical Management of Chronic Issues    Referring provider: [name], MD  HPI: 83 y.o., former smoker quit 1973 (15-pack-year history).  Past medical history significant for obstructive sleep apnea, hypertension, pulmonary embolism, coronary artery disease, 1 diastolic dysfunction, thoracic aortic aneurysm, type 2 diabetes, hyperlipidemia, morbid obesity.   Doing fine, denies significant DOE.  Unfortunately found to be newly anemic.  Hemoccult positive.  Is quite possible ongoing anticoagulation is worsening internal bleeding if present.  We discussed risk and benefits of ongoing anticoagulation as well as risk benefits of stopping anticoagulation.  After shared decision-making we agreed to stop his blood thinner.  Tried Trelegy in the room.  No better in Advair.  Resumed Advair and to continue.  Imaging:  Sniff test 10/2023 elevated right hemidiaphragm that does not move consistent with paralysis  CXR 07/15/21 showed no acute findings  CTA 07/13/21 no central obstructing PE. Acute wedge shaped consolidation abutting the pleural of the right lower lobe with surrounding ground glass opacity, suspicious for pulmonary infarction   No Known Allergies  Immunization History  Administered Date(s) Administered   Fluad Trivalent(High Dose 65+) 08/21/2023   Fluzone Influenza virus vaccine,trivalent (IIV3), split virus 09/02/2010, 08/21/2011, 08/23/2012   Influenza Split 08/19/2013, 08/21/2014   Influenza, Quadrivalent, Recombinant, Inj, Pf 08/24/2018, 07/22/2019, 09/25/2020, 09/09/2021, 09/19/2023   Influenza,inj,Quad PF,6+ Mos 08/18/2014, 08/09/2015   PFIZER(Purple Top)SARS-COV-2 Vaccination 12/24/2019, 01/13/2020, 09/02/2020, 05/13/2021, 06/19/2023   Pneumococcal Conjugate-13 09/19/2013   Tdap 07/13/2012   Zoster, Live 03/07/2014    Past Medical History:   Diagnosis Date   AAA (abdominal aortic aneurysm) without rupture (HCC)    followed by dr brain--  per office note dated 11-05-2018  3.5cm   Benign essential HTN    cardiologist-- dr tally   Chronic combined systolic (congestive) and diastolic (congestive) heart failure [facility])    cardiologist-- dr katharyn   CKD (chronic kidney disease), stage III (HCC)    Diabetes mellitus type 2, insulin  dependent (HCC)    followed by dr onita   Diabetic neuropathy (HCC)    DOE (dyspnea on exertion)    Edema of both lower extremities    Full dentures    HOH (hard of hearing)    does wear hearing aids   Hydrocele, bilateral    Lichen planus    Mixed hyperlipidemia    Morbid obesity with BMI of 45.0-49.9, adult (HCC)    Nocturia    OA (osteoarthritis)    OSA on CPAP 10/14/08   per study  AHI  61.8/hr ,  RDI  96.1/hr.     Tobacco History: Social History   Tobacco Use  Smoking Status Former   Current packs/day: 0.00   Average packs/day: 1.5 packs/day for 10.0 years (15.0 ttl pk-yrs)   Types: Cigarettes   Start date: 12/13/1961   Quit date: 12/14/1971   Years since quitting: 52.5  Smokeless Tobacco Former   Types: Snuff, Chew   Counseling given: Not Answered   Outpatient Medications Prior to Visit  Medication Sig Dispense Refill   albuterol  (VENTOLIN  HFA) 108 (90 Base) MCG/ACT inhaler Inhale 2 puffs into the lungs every 6 (six) hours as needed for wheezing or shortness of breath. 1 each 6   apixaban  (ELIQUIS ) 5 MG TABS tablet Take 2 tablets (10mg ) twice daily for 7 days, then 1 tablet (5mg ) twice daily 180 tablet  3   BIDIL  20-37.5 MG tablet Take 2 tablets by mouth 3 (three) times daily. 540 tablet 3   carvedilol  (COREG ) 25 MG tablet TAKE TWO TABLETS BY MOUTH TWICE DAILY with meals (Patient taking differently: Take 25 mg by mouth 2 (two) times daily with a meal. TAKE TWO TABLETS BY MOUTH TWICE DAILY with meals) 360 tablet 3   clobetasol  ointment (TEMOVATE ) 0.05 % Apply 1  application topically daily as needed (for itching).      Cyanocobalamin  2500 MCG TABS Take 500 mcg by mouth daily.      diclofenac sodium (VOLTAREN) 1 % GEL Apply 1 application topically 4 (four) times daily as needed (For pain.).     Dulaglutide 1.5 MG/0.5ML SOPN Inject 0.75 Doses into the skin once a week. Wednesday's / PT TAKING 0.75     felodipine  (PLENDIL ) 5 MG 24 hr tablet TAKE TWO TABLETS BY MOUTH DAILY 180 tablet 0   fluticasone -salmeterol (ADVAIR) 250-50 MCG/ACT AEPB Inhale 1 puff into the lungs in the morning and at bedtime. 60 each 5   furosemide  (LASIX ) 40 MG tablet Take 1 tablet (40 mg total) by mouth as needed (leg edema). 90 tablet 1   HYDROcodone -acetaminophen  (NORCO/VICODIN) 5-325 MG tablet 2 (two) times daily.     loratadine (CLARITIN) 10 MG tablet Take 10 mg by mouth every morning.      losartan  (COZAAR ) 100 MG tablet Take 100 mg by mouth every evening.      MEGARED OMEGA-3 KRILL OIL PO Take 1 tablet by mouth daily.     Multiple Vitamin (MULTIVITAMIN WITH MINERALS) TABS tablet Take 1 tablet by mouth daily.     ONETOUCH VERIO test strip USE TO TEST BID AS DIRECTED  2   Polyethyl Glycol-Propyl Glycol (SYSTANE OP) Apply to eye as needed.     simvastatin  (ZOCOR ) 40 MG tablet Take 40 mg by mouth every evening.     spironolactone  (ALDACTONE ) 50 MG tablet Take 0.5 tablets (25 mg total) by mouth daily.     Turmeric 400 MG CAPS Take by mouth as needed.     urea (CARMOL) 10 % cream Apply topically as needed.     potassium chloride  SA (K-DUR) 20 MEQ tablet Take 20 mEq by mouth daily. (Patient not taking: Reported on 06/24/2024)     No facility-administered medications prior to visit.      Review of Systems  Review of Systems N/a  Physical Exam  BP 99/62   Pulse 75   Temp 99.4 F (37.4 C) (Temporal)   Ht 6' 2 (1.88 m)   Wt (!) 303 lb 8 oz (137.7 kg)   SpO2 92% Comment: ra  BMI 38.97 kg/m  Physical Exam  Neuro: Well-appearing, no acute distress Eyes: EOMI, no  icterus Neck: Supple, no JVP appreciated Pulmonary: Clear, normal work of breathing Cardiovascular: Regular rate and rhythm, no murmurs   Lab Results: Personally reviewed CBC    Component Value Date/Time   WBC 7.7 05/31/2024 2054   RBC 3.81 (L) 05/31/2024 2054   HGB 8.6 (L) 05/31/2024 2054   HGB 14.1 09/10/2017 1056   HCT 29.1 (L) 05/31/2024 2054   HCT 42.3 09/10/2017 1056   PLT 237 05/31/2024 2054   PLT 141 (L) 09/10/2017 1056   MCV 76.4 (L) 05/31/2024 2054   MCV 81 09/10/2017 1056   MCH 22.6 (L) 05/31/2024 2054   MCHC 29.6 (L) 05/31/2024 2054   RDW 17.7 (H) 05/31/2024 2054   RDW 15.1 09/10/2017 1056   LYMPHSABS 1.6  07/13/2021 1339   MONOABS 1.7 (H) 07/13/2021 1339   EOSABS 0.1 07/13/2021 1339   BASOSABS 0.0 07/13/2021 1339    BMET    Component Value Date/Time   NA 130 (L) 05/31/2024 2054   NA 140 11/09/2020 0806   K 5.2 (H) 05/31/2024 2054   CL 96 (L) 05/31/2024 2054   CO2 25 05/31/2024 2054   GLUCOSE 155 (H) 05/31/2024 2054   BUN 22 05/31/2024 2054   BUN 16 11/09/2020 0806   CREATININE 1.64 (H) 05/31/2024 2054   CREATININE 1.14 03/03/2017 1028   CALCIUM 8.8 (L) 05/31/2024 2054   GFRNONAA 41 (L) 05/31/2024 2054   GFRAA 57 (L) 11/09/2020 0806    BNP    Component Value Date/Time   BNP 15.9 07/13/2021 1339    ProBNP No results found for: PROBNP  Imaging: Personally reviewed and interpreted as CTA 07/13/2021 with no PE seen right lower lobe wedge-shaped infiltrate concerning for infarct Nuclear medicine perfusion scan 07/15/2021 without evidence of pulmonary embolus  Assessment & Plan:   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   Presumed related to blood clot.  Discussed risk and benefits of ongoing anticoagulation.  Previously recommended lifelong anticoagulation.  However now with concern for bleeding, new anemia, new GI bleed we must reevaluate the risks and benefits.   While the severity of presumed clot was relatively severe and lifelong anticoagulation was recommended in the past, given there is a lack of definitive diagnosis as well as now new bleeding complication, risk of anticoagulation seems too high.  After shared decision making and discussing risk of benefits of continuing and stopping anticoagulation, we agreed to stop anticoagulation moving forward.  Eliquis  removed from medication list.  OSA on CPAP: continue CPAP, oxygen  bleed in.  He reports good adherence.  Compliance report reviewed, AHI 1.0.  Using every night for multiple hours.  He continues to benefit clinically from ongoing CPAP usage.  Asthma: Wheeze in the past improved with prednisone .  Albuterol  helps.  Advair mid dose Diskus with some improvement.  Escalate to Trelegy in the interim and no improvement.  Back on Advair.  To continue.  Preoperative evaluation: Pulmonary medicine does not provide preoperative plans related preoperative risk assessment.  Based on the ARISCAT model patient is intermediate to 13.3% risk of postoperative pulmonary complication.  Risk is elevated in the setting of new anemia.  Other risk factors include age and mild decrease in oxygen  saturation.  There is no reversibility to this.  No further recommendations.  Stopping anticoagulation as above.  Return to clinic in 1 year or sooner as needed with Dr. Annella Donnice JONELLE Annella, MD 06/24/2024

## 2024-06-30 NOTE — Anesthesia Preprocedure Evaluation (Signed)
 Anesthesia Evaluation  Patient identified by MRN, date of birth, ID band Patient awake    Reviewed: Allergy & Precautions, NPO status , Patient's Chart, lab work & pertinent test results, reviewed documented beta blocker date and time   Airway Mallampati: II  TM Distance: >3 FB Neck ROM: Full    Dental  (+) Edentulous Upper, Edentulous Lower   Pulmonary sleep apnea and Continuous Positive Airway Pressure Ventilation , former smoker, PE   Pulmonary exam normal breath sounds clear to auscultation       Cardiovascular hypertension, Pt. on home beta blockers and Pt. on medications + CAD, +CHF and + DOE  Normal cardiovascular exam Rhythm:Regular Rate:Normal  TTE 2022 1. Left ventricular ejection fraction, by estimation, is 50 to 55%. The  left ventricle has low normal function. The left ventricle has no regional  wall motion abnormalities. Left ventricular diastolic parameters are  consistent with Grade I diastolic  dysfunction (impaired relaxation).   2. Right ventricular systolic function is normal. The right ventricular  size is mildly enlarged.   3. The mitral valve is grossly normal. No evidence of mitral valve  regurgitation.   4. Tricuspid valve regurgitation is mild to moderate. IVC not well  visualized to estimate PASP.   5. The aortic valve is tricuspid. Aortic valve regurgitation is not  visualized.   6. Aortic dilatation noted. Aneurysm of the ascending aorta, measuring 43  mm.   7. Compared to previous outpatient study in 2021, AI, MR not well  appreciated. TR appears new.     Neuro/Psych negative neurological ROS  negative psych ROS   GI/Hepatic negative GI ROS, Neg liver ROS,,,  Endo/Other  diabetes  Class 3 obesity  Renal/GU Renal InsufficiencyRenal disease  negative genitourinary   Musculoskeletal  (+) Arthritis ,    Abdominal   Peds  Hematology negative hematology ROS (+)   Anesthesia Other  Findings   Reproductive/Obstetrics                              Anesthesia Physical Anesthesia Plan  ASA: 3  Anesthesia Plan: MAC   Post-op Pain Management:    Induction: Intravenous  PONV Risk Score and Plan: 1 and Propofol  infusion and Treatment may vary due to age or medical condition  Airway Management Planned: Natural Airway  Additional Equipment:   Intra-op Plan:   Post-operative Plan:   Informed Consent: I have reviewed the patients History and Physical, chart, labs and discussed the procedure including the risks, benefits and alternatives for the proposed anesthesia with the patient or authorized representative who has indicated his/her understanding and acceptance.     Dental advisory given  Plan Discussed with: CRNA  Anesthesia Plan Comments:          Anesthesia Quick Evaluation

## 2024-07-01 ENCOUNTER — Encounter (HOSPITAL_COMMUNITY)
Admission: RE | Disposition: A | Discharge status: Home / Self Care | Payer: Self-pay | Source: Home / Self Care | Attending: Gastroenterology

## 2024-07-01 ENCOUNTER — Encounter (HOSPITAL_COMMUNITY): Payer: Self-pay | Admitting: Gastroenterology

## 2024-07-01 ENCOUNTER — Encounter (HOSPITAL_COMMUNITY): Payer: Self-pay | Admitting: Anesthesiology

## 2024-07-01 ENCOUNTER — Other Ambulatory Visit: Payer: Self-pay

## 2024-07-01 ENCOUNTER — Other Ambulatory Visit (HOSPITAL_COMMUNITY): Payer: Self-pay | Admitting: Gastroenterology

## 2024-07-01 ENCOUNTER — Ambulatory Visit (HOSPITAL_BASED_OUTPATIENT_CLINIC_OR_DEPARTMENT_OTHER): Payer: Self-pay | Admitting: Anesthesiology

## 2024-07-01 ENCOUNTER — Ambulatory Visit (HOSPITAL_COMMUNITY)
Admission: RE | Admit: 2024-07-01 | Discharge: 2024-07-01 | Disposition: A | Discharge status: Home / Self Care | Attending: Gastroenterology | Admitting: Gastroenterology

## 2024-07-01 DIAGNOSIS — I2510 Atherosclerotic heart disease of native coronary artery without angina pectoris: Secondary | ICD-10-CM | POA: Insufficient documentation

## 2024-07-01 DIAGNOSIS — N183 Chronic kidney disease, stage 3 unspecified: Secondary | ICD-10-CM | POA: Diagnosis not present

## 2024-07-01 DIAGNOSIS — E1122 Type 2 diabetes mellitus with diabetic chronic kidney disease: Secondary | ICD-10-CM | POA: Diagnosis not present

## 2024-07-01 DIAGNOSIS — G4733 Obstructive sleep apnea (adult) (pediatric): Secondary | ICD-10-CM | POA: Diagnosis not present

## 2024-07-01 DIAGNOSIS — D509 Iron deficiency anemia, unspecified: Secondary | ICD-10-CM | POA: Insufficient documentation

## 2024-07-01 DIAGNOSIS — I130 Hypertensive heart and chronic kidney disease with heart failure and stage 1 through stage 4 chronic kidney disease, or unspecified chronic kidney disease: Secondary | ICD-10-CM | POA: Insufficient documentation

## 2024-07-01 DIAGNOSIS — C165 Malignant neoplasm of lesser curvature of stomach, unspecified: Secondary | ICD-10-CM | POA: Insufficient documentation

## 2024-07-01 DIAGNOSIS — C169 Malignant neoplasm of stomach, unspecified: Secondary | ICD-10-CM

## 2024-07-01 DIAGNOSIS — Z87891 Personal history of nicotine dependence: Secondary | ICD-10-CM | POA: Diagnosis not present

## 2024-07-01 DIAGNOSIS — Z794 Long term (current) use of insulin: Secondary | ICD-10-CM | POA: Insufficient documentation

## 2024-07-01 DIAGNOSIS — K319 Disease of stomach and duodenum, unspecified: Secondary | ICD-10-CM | POA: Diagnosis not present

## 2024-07-01 DIAGNOSIS — I5042 Chronic combined systolic (congestive) and diastolic (congestive) heart failure: Secondary | ICD-10-CM

## 2024-07-01 HISTORY — PX: ESOPHAGOGASTRODUODENOSCOPY: SHX5428

## 2024-07-01 LAB — GLUCOSE, CAPILLARY: Glucose-Capillary: 108 mg/dL — ABNORMAL HIGH (ref 70–99)

## 2024-07-01 SURGERY — EGD (ESOPHAGOGASTRODUODENOSCOPY)
Anesthesia: Monitor Anesthesia Care

## 2024-07-01 MED ORDER — PHENYLEPHRINE 80 MCG/ML (10ML) SYRINGE FOR IV PUSH (FOR BLOOD PRESSURE SUPPORT)
PREFILLED_SYRINGE | INTRAVENOUS | Status: DC | PRN
  Administered 2024-07-01 (×3): 80 ug via INTRAVENOUS

## 2024-07-01 MED ORDER — PROPOFOL 10 MG/ML IV BOLUS
INTRAVENOUS | Status: DC | PRN
  Administered 2024-07-01: 20 mg via INTRAVENOUS

## 2024-07-01 MED ORDER — LIDOCAINE 2% (20 MG/ML) 5 ML SYRINGE
INTRAMUSCULAR | Status: DC | PRN
  Administered 2024-07-01: 40 mg via INTRAVENOUS

## 2024-07-01 MED ORDER — PROPOFOL 500 MG/50ML IV EMUL
INTRAVENOUS | Status: DC | PRN
  Administered 2024-07-01: 180 ug/kg/min via INTRAVENOUS

## 2024-07-01 MED ORDER — SODIUM CHLORIDE 0.9 % IV SOLN: INTRAVENOUS | Status: DC

## 2024-07-01 NOTE — Transfer of Care (Signed)
 Immediate Anesthesia Transfer of Care Note  Patient: Bradley Alvarez  Procedure(s) Performed: EGD (ESOPHAGOGASTRODUODENOSCOPY) COLONOSCOPY  Patient Location: PACU and Endoscopy Unit  Anesthesia Type:MAC  Level of Consciousness: drowsy and responds to stimulation  Airway & Oxygen  Therapy: Patient Spontanous Breathing and Patient connected to face mask oxygen   Post-op Assessment: Report given to RN and Post -op Vital signs reviewed and stable  Post vital signs: Reviewed and stable  Last Vitals:  Vitals Value Taken Time  BP    Temp    Pulse 66 07/01/24 11:24  Resp 16 07/01/24 11:24  SpO2 98 % 07/01/24 11:24  Vitals shown include unfiled device data.  Last Pain:  Vitals:   07/01/24 0946  TempSrc: Temporal  PainSc: 0-No pain      Patients Stated Pain Goal: 0 (07/01/24 0946)  Complications: No notable events documented.

## 2024-07-01 NOTE — H&P (Signed)
 Bradley Alvarez HPI: The patient was recently in the ER for complaints of epigastric pain.  This occurred after he ate some shrimp and oysters, but it improved after he took some ginger ale and eructated.  The ER work up was negative for ACS, but he was noted to be anemic at 8.6 g/dL.  His last reported CBC in Epic was on 07/16/2021 with an HGB of 12.0 g/dL.  His baseline tends to vary brom 10-14 g/dL.  There is a history of CKD.  He followed up with Dr. Elmira, cardiology, and he confirmed that he was low risk for ischemia at that time.  There is a history of a thoracic ascending aneurysm measuring 4.8 cm (03/2024).  The patient has a history of adenomas.  His last colonoscopy, which was positive for 5 small TAs, was in 2017.  Prior to that time he had a colonoscopy in 2011 and 2014 all with findings of multiple small polyps.  The patient does not report any issues with hematochezia or melena.  Routine blood with with Dr. Onita showed that his ferritin was at 17 and his HGB remains at 8.5 g/dL.  He takes Eliquis  for his history of PE.  Past Medical History:  Diagnosis Date   AAA (abdominal aortic aneurysm) without rupture (HCC)    followed by dr brain--  per office note dated 11-05-2018  3.5cm   Benign essential HTN    cardiologist-- dr tally   Chronic combined systolic (congestive) and diastolic (congestive) heart failure [facility])    cardiologist-- dr katharyn   CKD (chronic kidney disease), stage III (HCC)    Diabetes mellitus type 2, insulin  dependent (HCC)    followed by dr onita   Diabetic neuropathy (HCC)    DOE (dyspnea on exertion)    Edema of both lower extremities    Full dentures    HOH (hard of hearing)    does wear hearing aids   Hydrocele, bilateral    Lichen planus    Mixed hyperlipidemia    Morbid obesity with BMI of 45.0-49.9, adult (HCC)    Nocturia    OA (osteoarthritis)    OSA on CPAP 10/14/08   per study  AHI  61.8/hr ,  RDI  96.1/hr.     Past Surgical  History:  Procedure Laterality Date   CARDIAC CATHETERIZATION  per pt yrs ago   was told no blockage   CARDIOVASCULAR STRESS TEST  07-19-2014   dr debby sor   Low risk nuclear study w/ small apical attentuation artifact/  normal LV function and wall motion , ef 53%   CATARACT EXTRACTION W/ INTRAOCULAR LENS  IMPLANT, BILATERAL  2018   COLONOSCOPY W/ BIOPSIES AND POLYPECTOMY     COLONOSCOPY WITH PROPOFOL  N/A 05/16/2016   Procedure: COLONOSCOPY WITH PROPOFOL ;  Surgeon: [name], MD;  Location: [facility];  Service: Endoscopy;  Laterality: N/A;   HYDROCELE EXCISION Bilateral 11/15/2018   Procedure: HYDROCELECTOMY ADULT;  Surgeon: [name], Mark, MD;  Location: [facility];  Service: Urology;  Laterality: Bilateral;   KNEE ARTHROSCOPY Right 2009   MULTIPLE TOOTH EXTRACTIONS     ORIF ANKLE FRACTURE Left 09/13/2014   Procedure: Open Reduction Internal Fixation Left Ankle Medial Malleolus;  Surgeon: [name], MD;  Location: [facility] OR;  Service: Orthopedics;  Laterality: Left;   PROSTATECTOMY  1993 approx.  by dr matilda   for prostate enlargement   TOTAL KNEE ARTHROPLASTY Right 08/27/2009   dr jane @[facility]    TOTAL  KNEE ARTHROPLASTY  12/20/2012   Procedure: TOTAL KNEE ARTHROPLASTY;  Surgeon: Lamar DELENA Millman, MD;  Location: [facility] OR;  Service: Orthopedics;  Laterality: Left;  left total knee arthroplasty   TRANSTHORACIC ECHOCARDIOGRAM  11-04-2018   dr patwardhan   moderate to severe concentric LVH, ef 56%,  grade 1 diastolic dysfunciton/  mild AV calcification annulus and leaflets without stentosis ,  mild regurg./  moderate LAE/ dilated aortic root, 4.5cm/  mild MV annulus calcification with trace regurg./  trace TR    Family History  Problem Relation Age of Onset   Hypertension Mother    Diabetes Mellitus II Mother    Heart attack Father    Diabetes Mellitus II Sister    Heart disease Sister    Diabetes Mellitus II Brother    Stroke Brother    Diabetes Mellitus II  Brother    Kidney disease Brother    Diabetes Mellitus II Brother    Diabetes Mellitus II Sister    Heart disease Sister    Hypertension Other    Diabetes Mellitus II Other    Stroke Other    Heart disease Other     Social History:  reports that he quit smoking about 52 years ago. His smoking use included cigarettes. He started smoking about 62 years ago. He has a 15 pack-year smoking history. He has quit using smokeless tobacco.  His smokeless tobacco use included snuff and chew. He reports current alcohol  use of about 1.0 standard drink of alcohol  per week. He reports that he does not use drugs.  Allergies: No Known Allergies  Medications: Scheduled: Continuous:  sodium chloride  20 mL/hr at 07/01/24 9057    Results for orders placed or performed during the hospital encounter of 07/01/24 (from the past 24 hours)  Glucose, capillary     Status: Abnormal   Collection Time: 07/01/24  9:45 AM  Result Value Ref Range   Glucose-Capillary 108 (H) 70 - 99 mg/dL     No results found.  ROS:  As stated above in the HPI otherwise negative.  Blood pressure 129/75, temperature 98.2 F (36.8 C), temperature source Temporal, resp. rate 17, height 6' 2 (1.88 m), weight (!) 137.7 kg, SpO2 95%.    PE: Gen: NAD, Alert and Oriented HEENT:  [place]/AT, EOMI Neck: Supple, no LAD Lungs: CTA Bilaterally CV: RRR without M/G/R ABD: Soft, NTND, +BS Ext: No C/C/E  Assessment/Plan: 1) IDA - EGD/colonoscopy.  [name] 07/01/2024, 10:00 AM

## 2024-07-01 NOTE — Op Note (Signed)
 [facility] Patient Name: Bradley Alvarez Procedure Date: 07/01/2024 MRN: [id number] Attending MD: [name] , MD, [id number] Date of Birth: July 15, 1941 CSN: [id number] Age: 83 Admit Type: Outpatient Procedure:                Upper GI endoscopy Indications:              Iron deficiency anemia Providers:                [name], MD, Darleene Bare, RN, Curtistine Bishop,                            Technician Referring MD:             [name], MD Medicines:                Propofol  per Anesthesia Complications:            No immediate complications. Estimated Blood Loss:     Estimated blood loss was minimal. Procedure:                Pre-Anesthesia Assessment:                           - Prior to the procedure, a History and Physical                            was performed, and patient medications and                            allergies were reviewed. The patient's tolerance of                            previous anesthesia was also reviewed. The risks                            and benefits of the procedure and the sedation                            options and risks were discussed with the patient.                            All questions were answered, and informed consent                            was obtained. Prior Anticoagulants: The patient has                            taken no anticoagulant or antiplatelet agents. ASA                            Grade Assessment: III - A patient with severe                            systemic disease. After reviewing the risks and  benefits, the patient was deemed in satisfactory                            condition to undergo the procedure.                           - Sedation was administered by an anesthesia                            professional. Deep sedation was attained.                           After obtaining informed consent, the endoscope was                            passed under  direct vision. Throughout the                            procedure, the patient's blood pressure, pulse, and                            oxygen  saturations were monitored continuously. The                            [id number]-HQ190L ([id number]) Olympus colonoscope was                            introduced through the mouth, and advanced to the                            second part of duodenum. The upper GI endoscopy was                            accomplished without difficulty. The patient                            tolerated the procedure well. Scope In: Scope Out: Findings:      The esophagus was normal.      A large, fungating and ulcerated, non-circumferential mass with no       bleeding and no stigmata of recent bleeding was found on the lesser       curvature of the stomach. Biopsies were taken with a cold forceps for       histology. For hemostasis, hemostatic spray was deployed. Multiple       sprays were applied. There was no bleeding at the end of the procedure.      The examined duodenum was normal.      A large gastric mass was found in the lesser curvature of the stomach.       It was fungating and centrally ulcerated. The mass was very friable and       significant oozing did occur, but the bleeding did arrest spontaneously.       After multiple cold biopsies, Nexpowder was sprayed across the majority       of the mass as secondary prophylaxis with bleeding. Impression:               -  Normal esophagus.                           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 Biopsied. hemostatic spray applied.                           - Normal examined duodenum. Moderate Sedation:      Not Applicable - Patient had care per Anesthesia. Recommendation:           - Patient has a contact number available for                            emergencies. The signs and symptoms of potential                            delayed complications were  discussed with the                            patient. Return to normal activities tomorrow.                            Written discharge instructions were provided to the                            patient.                           - Resume regular diet.                           - Continue present medications.                           - Await pathology results.                           - CT scan of the chest, abdomen, and pelvis.                           - Continue to hold anticoagulation. Procedure Code(s):        --- Professional ---                           43255, 59, Esophagogastroduodenoscopy, flexible,                            transoral; with control of bleeding, any method                           43239, Esophagogastroduodenoscopy, flexible,                            transoral; with biopsy, single or multiple Diagnosis Code(s):        ---  Professional ---                           C16.5, Malignant neoplasm of lesser curvature of                            stomach, unspecified                           D50.9, Iron deficiency anemia, unspecified CPT copyright 2022 [employer]. All rights reserved. The codes documented in this report are preliminary and upon coder review may  be revised to meet current compliance requirements. [name], MD [name], MD 07/01/2024 11:27:31 AM This report has been signed electronically. Number of Addenda: 0

## 2024-07-01 NOTE — Discharge Instructions (Signed)

## 2024-07-01 NOTE — Anesthesia Postprocedure Evaluation (Signed)
 Anesthesia Post Note  Patient: Bradley Alvarez  Procedure(s) Performed: EGD (ESOPHAGOGASTRODUODENOSCOPY) COLONOSCOPY     Patient location during evaluation: PACU Anesthesia Type: MAC Level of consciousness: awake and alert Pain management: pain level controlled Vital Signs Assessment: post-procedure vital signs reviewed and stable Respiratory status: spontaneous breathing, nonlabored ventilation, respiratory function stable and patient connected to nasal cannula oxygen  Cardiovascular status: stable and blood pressure returned to baseline Postop Assessment: no apparent nausea or vomiting Anesthetic complications: no   No notable events documented.  Last Vitals:  Vitals:   07/01/24 1130 07/01/24 1140  BP: 117/61 135/72  Pulse: 66 66  Resp: 18 17  Temp:    SpO2: 91% 92%    Last Pain:  Vitals:   07/01/24 1140  TempSrc:   PainSc: 0-No pain                 Thom JONELLE Peoples

## 2024-07-01 NOTE — Progress Notes (Signed)
 [place]

## 2024-07-04 ENCOUNTER — Telehealth: Payer: Self-pay

## 2024-07-04 ENCOUNTER — Encounter (HOSPITAL_COMMUNITY): Payer: Self-pay | Admitting: Gastroenterology

## 2024-07-04 NOTE — Telephone Encounter (Signed)
 Copied from CRM [id number]. Topic: Clinical - Medical Advice >> Jul 04, 2024 11:12 AM Leila C wrote: Reason for CRM: Patient's spouse Eula [phone] states wants to give Dr. Annella patient's results from colonoscopy and EGD, patient has stomach cancer. Eula wants to ask if patient still needs to be seen for 08/04/24 appointment and if patient needs to be off Eliquis  still or restart? Please advise and call back.    PT called to cancel ppt. After being seen on 07/25 and told to followup in a year.   NFN-

## 2024-07-06 ENCOUNTER — Ambulatory Visit (HOSPITAL_COMMUNITY)
Admission: RE | Admit: 2024-07-06 | Discharge: 2024-07-06 | Disposition: A | Discharge status: Home / Self Care | Source: Ambulatory Visit | Attending: Gastroenterology | Admitting: Gastroenterology

## 2024-07-06 DIAGNOSIS — I7121 Aneurysm of the ascending aorta, without rupture: Secondary | ICD-10-CM | POA: Diagnosis not present

## 2024-07-06 DIAGNOSIS — C169 Malignant neoplasm of stomach, unspecified: Secondary | ICD-10-CM | POA: Diagnosis not present

## 2024-07-06 DIAGNOSIS — K8020 Calculus of gallbladder without cholecystitis without obstruction: Secondary | ICD-10-CM | POA: Diagnosis not present

## 2024-07-06 DIAGNOSIS — N179 Acute kidney failure, unspecified: Secondary | ICD-10-CM | POA: Insufficient documentation

## 2024-07-06 DIAGNOSIS — I714 Abdominal aortic aneurysm, without rupture, unspecified: Secondary | ICD-10-CM | POA: Insufficient documentation

## 2024-07-06 LAB — POCT I-STAT CREATININE: Creatinine, Ser: 1.6 mg/dL — ABNORMAL HIGH (ref 0.61–1.24)

## 2024-07-06 MED ORDER — IOHEXOL 300 MG/ML  SOLN
80.0000 mL | Freq: Once | INTRAMUSCULAR | Status: AC | PRN
  Administered 2024-07-06: 80 mL via INTRAVENOUS

## 2024-07-07 ENCOUNTER — Encounter: Payer: Self-pay | Admitting: *Deleted

## 2024-07-07 ENCOUNTER — Other Ambulatory Visit: Payer: Self-pay

## 2024-07-07 ENCOUNTER — Other Ambulatory Visit: Payer: Self-pay | Admitting: *Deleted

## 2024-07-07 DIAGNOSIS — C162 Malignant neoplasm of body of stomach: Secondary | ICD-10-CM

## 2024-07-07 NOTE — Progress Notes (Signed)
 Left voicemail regarding referral from Dr Rollin, will await return call to schedule for next Tuesday 07/12/24

## 2024-07-07 NOTE — Progress Notes (Signed)
 Opened in error

## 2024-07-07 NOTE — Progress Notes (Signed)
 PATIENT NAVIGATOR PROGRESS NOTE  Name: Bradley Alvarez Date: 07/07/2024 MRN: [id number]  DOB: 04/15/1941   Reason for visit:  New patient appt  Comments:  Called and spoke with Bradley Alvarez and have scheduled Bradley Alvarez with NP [name] on 07/12/24 at 1:45. Reviewed directions to building and parking as well as one support person allowed in the appt.   Given contact number to call with any issues.      Time spent counseling/coordinating care: 30-45 minutes

## 2024-07-07 NOTE — Progress Notes (Signed)
 Referral entered

## 2024-07-08 ENCOUNTER — Other Ambulatory Visit: Admitting: Nurse Practitioner

## 2024-07-08 LAB — SURGICAL PATHOLOGY

## 2024-07-11 NOTE — Progress Notes (Signed)
 New Hematology/Oncology Consult   Requesting MD: Dr. [name]  [phone]      Reason for Consult: Gastric cancer  HPI: Mr. Bradley Alvarez is an 83 year old male with a recent diagnosis of gastric cancer.  He underwent an upper endoscopy on 07/01/2024 due to iron  deficiency anemia.  He was found to have a large, fungating ulcerated, noncircumferential mass with no bleeding on the lesser curvature of the stomach.  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  CTs 07/06/2024 show enhancing lesser curvature wall thickening/mass; mesenteric root fat stranding and multiple retroperitoneal/periaortic and celiac lymph nodes; new right upper lobe upper lobe pulmonary nodule; multiple prominent mediastinal lymph nodes.     Past Medical History:  Diagnosis Date   AAA (abdominal aortic aneurysm) without rupture (HCC)    followed by dr brain--  per office note dated 11-05-2018  3.5cm   Benign essential HTN    cardiologist-- dr tally   Chronic combined systolic (congestive) and diastolic (congestive) heart failure [facility])    cardiologist-- dr katharyn   CKD (chronic kidney disease), stage III (HCC)    Diabetes mellitus type 2, insulin  dependent (HCC)    followed by dr onita   Diabetic neuropathy (HCC)    DOE (dyspnea on exertion)    Edema of both lower extremities    Full dentures    HOH (hard of hearing)    does wear hearing aids   Hydrocele, bilateral    Lichen planus    Mixed hyperlipidemia    Morbid obesity with BMI of 45.0-49.9, adult (HCC)    Nocturia    OA (osteoarthritis)    OSA on CPAP 10/14/08   per study  AHI  61.8/hr ,  RDI  96.1/hr.      Past Surgical History:  Procedure Laterality Date   CARDIAC CATHETERIZATION  per pt yrs ago   was told no blockage   CARDIOVASCULAR STRESS TEST  07-19-2014   dr debby sor   Low risk nuclear study w/ small apical attentuation artifact/  normal LV function and wall  motion , ef 53%   CATARACT EXTRACTION W/ INTRAOCULAR LENS  IMPLANT, BILATERAL  2018   COLONOSCOPY W/ BIOPSIES AND POLYPECTOMY     COLONOSCOPY WITH PROPOFOL  N/A 05/16/2016   Procedure: COLONOSCOPY WITH PROPOFOL ;  Surgeon: [name], MD;  Location: [facility];  Service: Endoscopy;  Laterality: N/A;   ESOPHAGOGASTRODUODENOSCOPY N/A 07/01/2024   Procedure: EGD (ESOPHAGOGASTRODUODENOSCOPY);  Surgeon: Just Belvie, MD;  Location: [facility];  Service: Gastroenterology;  Laterality: N/A;   HYDROCELE EXCISION Bilateral 11/15/2018   Procedure: HYDROCELECTOMY ADULT;  Surgeon: [name], Mark, MD;  Location: [facility];  Service: Urology;  Laterality: Bilateral;   KNEE ARTHROSCOPY Right 2009   MULTIPLE TOOTH EXTRACTIONS     ORIF ANKLE FRACTURE Left 09/13/2014   Procedure: Open Reduction Internal Fixation Left Ankle Medial Malleolus;  Surgeon: [name], MD;  Location: [facility] OR;  Service: Orthopedics;  Laterality: Left;   PROSTATECTOMY  1993 approx.  by dr matilda   for prostate enlargement   TOTAL KNEE ARTHROPLASTY Right 08/27/2009   dr jane @[facility]    TOTAL KNEE ARTHROPLASTY  12/20/2012   Procedure: TOTAL KNEE ARTHROPLASTY;  Surgeon: [name], MD;  Location: [facility] OR;  Service: Orthopedics;  Laterality: Left;  left total knee arthroplasty   TRANSTHORACIC ECHOCARDIOGRAM  11-04-2018   dr patwardhan   moderate to severe concentric LVH, ef 56%,  grade 1 diastolic dysfunciton/  mild AV calcification  annulus and leaflets without stentosis ,  mild regurg./  moderate LAE/ dilated aortic root, 4.5cm/  mild MV annulus calcification with trace regurg./  trace TR     Current Outpatient Medications:    albuterol  (VENTOLIN  HFA) 108 (90 Base) MCG/ACT inhaler, Inhale 2 puffs into the lungs every 6 (six) hours as needed for wheezing or shortness of breath., Disp: 1 each, Rfl: 6   BIDIL  20-37.5 MG tablet, Take 2 tablets by mouth 3 (three) times daily., Disp: 540 tablet, Rfl: 3   carvedilol   (COREG ) 25 MG tablet, TAKE TWO TABLETS BY MOUTH TWICE DAILY with meals (Patient taking differently: Take 25 mg by mouth 2 (two) times daily with a meal. TAKE TWO TABLETS BY MOUTH TWICE DAILY with meals), Disp: 360 tablet, Rfl: 3   clobetasol  ointment (TEMOVATE ) 0.05 %, Apply 1 application topically daily as needed (for itching). , Disp: , Rfl:    Cyanocobalamin  2500 MCG TABS, Take 500 mcg by mouth daily. , Disp: , Rfl:    diclofenac sodium (VOLTAREN) 1 % GEL, Apply 1 application topically 4 (four) times daily as needed (For pain.)., Disp: , Rfl:    Dulaglutide (TRULICITY) 0.75 MG/0.5ML SOAJ, Inject 0.75 mg into the skin once a week. On Wed., Disp: , Rfl:    felodipine  (PLENDIL ) 5 MG 24 hr tablet, TAKE TWO TABLETS BY MOUTH DAILY, Disp: 180 tablet, Rfl: 0   ferrous sulfate  325 (65 FE) MG tablet, Take 325 mg by mouth daily with breakfast., Disp: , Rfl:    fluticasone -salmeterol (ADVAIR) 250-50 MCG/ACT AEPB, Inhale one puff by mouth into the lungs in the morning and at bedtime., Disp: 60 each, Rfl: 10   HYDROcodone -acetaminophen  (NORCO/VICODIN) 5-325 MG tablet, 2 (two) times daily., Disp: , Rfl:    loratadine (CLARITIN) 10 MG tablet, Take 10 mg by mouth every morning. , Disp: , Rfl:    losartan  (COZAAR ) 100 MG tablet, Take 100 mg by mouth every evening. , Disp: , Rfl:    MEGARED OMEGA-3 KRILL OIL PO, Take 1 tablet by mouth daily., Disp: , Rfl:    methocarbamol  (ROBAXIN ) 500 MG tablet, Take 500 mg by mouth at bedtime as needed for muscle spasms., Disp: , Rfl:    Multiple Vitamin (MULTIVITAMIN WITH MINERALS) TABS tablet, Take 1 tablet by mouth daily., Disp: , Rfl:    omeprazole (PRILOSEC) 20 MG capsule, Take 20 mg by mouth daily., Disp: , Rfl:    ONETOUCH VERIO test strip, USE TO TEST BID AS DIRECTED, Disp: , Rfl: 2   Polyethyl Glycol-Propyl Glycol (SYSTANE OP), Apply to eye as needed., Disp: , Rfl:    simvastatin  (ZOCOR ) 40 MG tablet, Take 40 mg by mouth every evening., Disp: , Rfl:    spironolactone   (ALDACTONE ) 50 MG tablet, Take 0.5 tablets (25 mg total) by mouth daily., Disp: , Rfl:    Turmeric 400 MG CAPS, Take by mouth as needed., Disp: , Rfl:    urea (CARMOL) 10 % cream, Apply topically as needed., Disp: , Rfl:    Dulaglutide 1.5 MG/0.5ML SOPN, Inject 0.75 Doses into the skin once a week. Wednesday's / PT TAKING 0.75, Disp: , Rfl: :    No Known Allergies:  FH: Brother deceased kidney, thyroid  cancer.  Brother with prostate cancer.  Son deceased lung cancer.  SOCIAL HISTORY: Mr. Levitt lives in [place].  He is married.  He is retired.  He quit smoking in the 1970s.  He quit drinking alcohol  coinciding with the recent diagnosis.  Review of Systems: He  feels well.  He has a good appetite.  No weight loss.  He denies pain.  No dysphagia.  He denies bleeding.  He notes stools are dark, not black.  No change in bowel habits.  He is intermittently constipated.  No hematuria or dysuria.  He estimates getting up to urinate 2-3 times at night.  Legs swell when dependent, improve with elevation.  No numbness or tingling in the hands or feet except if he leans on his right arm.  He has chronic back pain.  Physical Exam:  Blood pressure 134/79, pulse 64, temperature 98.1 F (36.7 C), temperature source Temporal, resp. rate 18, height 6' 2 (1.88 m), weight (!) 313 lb (142 kg), SpO2 91%.  HEENT: No thrush or ulcers. Lungs: Lungs clear bilaterally. Cardiac: Regular rate and rhythm. Abdomen: No hepatosplenomegaly.  No mass.  Nontender. Vascular: Trace lower leg edema bilaterally right greater than left. Lymph nodes: No palpable cervical, supraclavicular, axillary or inguinal lymph nodes.  Prominent bilateral axillary fat pads. Neurologic: Alert and oriented.   LABS:  No results for input(s): WBC, HGB, HCT, PLT in the last 72 hours.  No results for input(s): NA, K, CL, CO2, GLUCOSE, BUN, CREATININE, CALCIUM in the last 72 hours.    RADIOLOGY:  CT CHEST  ABDOMEN PELVIS W CONTRAST Result Date: 07/06/2024 CLINICAL DATA:  New diagnosis of stomach cancer EXAM: CT CHEST, ABDOMEN, AND PELVIS WITH CONTRAST TECHNIQUE: Multidetector CT imaging of the chest, abdomen and pelvis was performed following the standard protocol during bolus administration of intravenous contrast. RADIATION DOSE REDUCTION: This exam was performed according to the departmental dose-optimization program which includes automated exposure control, adjustment of the mA and/or kV according to patient size and/or use of iterative reconstruction technique. CONTRAST:  80mL OMNIPAQUE  IOHEXOL  300 MG/ML  SOLN COMPARISON:  Chest CT March 03, 2024 FINDINGS: CT CHEST FINDINGS Cardiovascular: The heart size is borderline normal. Enlarged pulmonary artery measuring 44 mm. Aneurysmal dilation of ascending aorta measures 4.9 cm. No pericardial fluid. Mediastinum/Nodes: Multiple prominent subcentimeter and pericentimeter mediastinal lymph nodes. Index node in right lower paratracheal measures 1.4 cm. Lungs/Pleura: Right upper lobe pulmonary nodule measuring 5 mm (7/34), new to prior. No emphysematous changes, air space consolidation or honeycombing. No pleural effusion. Musculoskeletal: Unremarkable CT ABDOMEN PELVIS FINDINGS Hepatobiliary: Hepatic steatosis. Cholelithiasis without significant biliary dilatation. Normal gallbladder wall thickness. Pancreas: Unremarkable. No pancreatic ductal dilatation or surrounding inflammatory changes. Spleen: Normal in size without focal abnormality. Adrenals/Urinary Tract: Right adrenal small nodule measuring 8 mm. Left adrenal is normal. Bilateral simple renal cortical cysts which does not require imaging follow-up. Kidneys are otherwise normal, without renal calculi, or hydronephrosis. Bladder is unremarkable. Stomach/Bowel: There is an enhancing segment wall thickening/mass along the lesser curvature of the stomach measuring about 7.3 x 3.2 cm consistent with known malignancy.  Bowel loops are unremarkable Vascular/Lymphatic: Mesenteric root fat stranding with multiple para-aortic aortocaval and pericaval prominent subcentimeter and pericentimeter lymph nodes. Celiac lymph node measuring 1.4 cm (2/56). Additional subcentimeter multiple mesenteric and celiac node lymph nodes index node in aortocaval measures 9 mm (2/79 (2/79, 73 Reproductive: Nodular heterogeneous enlarged prostate. Other: Bilateral fat containing inguinal hernias. Small fat containing umbilical hernia. Musculoskeletal: Fat containing intramuscular lipoma along the left iliacus muscle measuring 2.7 cm. No suspicious osseous lesion. IMPRESSION: Enhancing lesser curvature wall thickening/mass likely correlating to known gastric malignancy. Mesenteric root fat stranding and multiple subcentimeter and pericentimeter retroperitoneal/para-aortic and celiac lymph nodes concerning for metastatic nodal disease. New upper lobe pulmonary nodule, indeterminate. Metastasis  cannot be excluded. Follow-up according to oncology protocols as Fleischner guidelines cannot be utilized and a it is in this patient with known malignancy. Stable ascending aorta aneurysm. Enlarged pulmonary artery which can be seen the setting of pulmonary artery hypertension. Cholelithiasis. Right adrenal small nodule, indeterminate. Metastasis cannot be excluded. Electronically Signed   By: Megan  Zare M.D.   On: 07/06/2024 13:20    Assessment and Plan:   Gastric cancer Upper endoscopy 07/01/2024-large fungating, ulcerated, noncircumferential mass on the lesser curvature of the stomach.  Pathology-invasive moderately differentiated adenocarcinoma; HER2 negative, loss of MLH1 and PMS2 CTs 07/06/2024-enhancing lesser curvature wall thickening/mass; mesenteric root fat stranding and multiple retroperitoneal/periaortic and celiac lymph nodes; new right upper lobe upper lobe pulmonary nodule; multiple prominent mediastinal lymph nodes. Iron  deficiency anemia   CAD/CHF Diabetes Hypertension CKD Obstructive sleep apnea History of pulmonary embolus August 2022-previously on apixaban .  Apixaban  stopped due to bleeding. Ascending aortic aneurysm  Bradley Alvarez has been diagnosed with gastric cancer.  We reviewed the pathology report including loss of MLH1 and PMS2 and recent CT report/images with him and his family.  We are referring him for a staging PET scan.  We have requested foundation 1/MSI testing.  We had preliminary discussion regarding treatment with immunotherapy.  His case will be presented at the GI tumor conference.  He has iron  deficiency anemia likely due to bleeding from the gastric mass.  He will increase oral iron  to twice daily.  He will return for additional discussion 07/20/2024.  We are available to see him sooner if needed.  Patient seen with Dr. Cloretta.    [name], NP 07/12/2024, 3:01 PM  This was a shared visit with [name].  Bradley Alvarez was interviewed and examined.  We reviewed the CT findings and images with Mr. Hickox and his family.  He has been diagnosed with gastric cancer.  The CTs reveal evidence of a gastric mass and small abdominal/retroperitoneal/mediastinal lymph nodes.  We will refer him for a staging PET scan and his case will be presented at the GI tumor conference.  The tumor returned with loss of MLH1 and PMS2 expression suggesting he may have an MSI high sporadic tumor.  If he is confirmed to have an MSI high tumor he will be a candidate for immunotherapy.  He does not appear to be a surgical candidate and he is not an ideal candidate for chemotherapy with multiple comorbid conditions.  I was present for greater than 50% of today's visit.  I performed Medical Decision Making.  Arvella Cloretta, MD

## 2024-07-12 ENCOUNTER — Other Ambulatory Visit: Payer: Self-pay | Admitting: Pulmonary Disease

## 2024-07-12 ENCOUNTER — Inpatient Hospital Stay

## 2024-07-12 ENCOUNTER — Inpatient Hospital Stay: Attending: Nurse Practitioner | Admitting: Nurse Practitioner

## 2024-07-12 ENCOUNTER — Encounter: Payer: Self-pay | Admitting: Nurse Practitioner

## 2024-07-12 VITALS — BP 134/79 | HR 64 | Temp 98.1°F | Resp 18 | Ht 74.0 in | Wt 313.0 lb

## 2024-07-12 DIAGNOSIS — J189 Pneumonia, unspecified organism: Secondary | ICD-10-CM | POA: Diagnosis not present

## 2024-07-12 DIAGNOSIS — C162 Malignant neoplasm of body of stomach: Secondary | ICD-10-CM

## 2024-07-12 DIAGNOSIS — R0602 Shortness of breath: Secondary | ICD-10-CM | POA: Diagnosis not present

## 2024-07-12 DIAGNOSIS — J181 Lobar pneumonia, unspecified organism: Secondary | ICD-10-CM | POA: Diagnosis not present

## 2024-07-12 LAB — CBC WITH DIFFERENTIAL (CANCER CENTER ONLY)
Abs Immature Granulocytes: 0.04 K/uL (ref 0.00–0.07)
Basophils Absolute: 0 K/uL (ref 0.0–0.1)
Basophils Relative: 0 %
Eosinophils Absolute: 0.1 K/uL (ref 0.0–0.5)
Eosinophils Relative: 2 %
HCT: 28.8 % — ABNORMAL LOW (ref 39.0–52.0)
Hemoglobin: 8.5 g/dL — ABNORMAL LOW (ref 13.0–17.0)
Immature Granulocytes: 1 %
Lymphocytes Relative: 18 %
Lymphs Abs: 1.5 K/uL (ref 0.7–4.0)
MCH: 22.3 pg — ABNORMAL LOW (ref 26.0–34.0)
MCHC: 29.5 g/dL — ABNORMAL LOW (ref 30.0–36.0)
MCV: 75.4 fL — ABNORMAL LOW (ref 80.0–100.0)
Monocytes Absolute: 1.5 K/uL — ABNORMAL HIGH (ref 0.1–1.0)
Monocytes Relative: 19 %
Neutro Abs: 4.8 K/uL (ref 1.7–7.7)
Neutrophils Relative %: 60 %
Platelet Count: 244 K/uL (ref 150–400)
RBC: 3.82 MIL/uL — ABNORMAL LOW (ref 4.22–5.81)
RDW: 21.1 % — ABNORMAL HIGH (ref 11.5–15.5)
WBC Count: 8 K/uL (ref 4.0–10.5)
nRBC: 0 % (ref 0.0–0.2)

## 2024-07-12 LAB — CMP (CANCER CENTER ONLY)
ALT: 12 U/L (ref 0–44)
AST: 21 U/L (ref 15–41)
Albumin: 3.5 g/dL (ref 3.5–5.0)
Alkaline Phosphatase: 53 U/L (ref 38–126)
Anion gap: 10 (ref 5–15)
BUN: 8 mg/dL (ref 8–23)
CO2: 25 mmol/L (ref 22–32)
Calcium: 8.9 mg/dL (ref 8.9–10.3)
Chloride: 103 mmol/L (ref 98–111)
Creatinine: 1.48 mg/dL — ABNORMAL HIGH (ref 0.61–1.24)
GFR, Estimated: 47 mL/min — ABNORMAL LOW (ref >60)
Glucose, Bld: 111 mg/dL — ABNORMAL HIGH (ref 70–99)
Potassium: 4.1 mmol/L (ref 3.5–5.1)
Sodium: 138 mmol/L (ref 135–145)
Total Bilirubin: 0.3 mg/dL (ref 0.0–1.2)
Total Protein: 6.6 g/dL (ref 6.5–8.1)

## 2024-07-12 LAB — CEA (ACCESS): CEA (CHCC): <1 ng/mL (ref 0.00–5.00)

## 2024-07-12 LAB — FERRITIN: Ferritin: 39 ng/mL (ref 24–336)

## 2024-07-12 NOTE — Progress Notes (Signed)
 [facility] Clinical Social Work  Initial Assessment   Bradley Alvarez is a 83 y.o. year old male accompanied by patient, daughter, and spouse. Clinical Social Work was referred by nurse navigator for assessment of psychosocial needs.   SDOH (Social Determinants of Health) assessments performed: Yes   SDOH Screenings   Food Insecurity: No Food Insecurity (07/12/2024)  Housing: Unknown (07/12/2024)  Transportation Needs: No Transportation Needs (07/12/2024)  Utilities: Not At Risk (07/12/2024)  Alcohol  Screen: Low Risk  (07/12/2024)  Depression (PHQ2-9): Low Risk  (07/12/2024)  Financial Resource Strain: Low Risk  (07/12/2024)  Physical Activity: Inactive (07/12/2024)  Social Connections: Socially Integrated (07/12/2024)  Stress: No Stress Concern Present (07/12/2024)  Tobacco Use: Medium Risk (07/12/2024)  Health Literacy: Adequate Health Literacy (07/12/2024)     Distress Screen completed: No     No data to display            Family/Social Information:  Housing Arrangement: patient lives with his spouse. Family members/support persons in your life? Patient was accompanied by his daughter and spouse. Patient's described an extensive family network, that includes additional children.   Transportation concerns: Patient's spouse will be transporting him to appointments. No transportation barriers identified.   Employment: Retired .  Income source: Special educational needs teacher Income Financial concerns: Patient denied any immediate financial concerns.   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 Patient spoke about his faith and how it is impacting his health. No spiritual distress noted. Advanced directives: No Services Currently in place:  Family, Insurance, Transportation, Income  Coping/ Adjustment to diagnosis: Patient understands treatment plan and what happens next? CSW  visited patient during initial consult appointment prior to provider coming in. Patient's treatment plan will be determined today. Concerns about diagnosis and/or treatment: Patient denied any concerns about diagnosis. Patient denied any levels of distress,  Patient reported stressors: None Hopes and/or priorities: for treatment to be successful Patient enjoys time with family/ friends Current coping skills/ strengths: Ability for insight , Active sense of humor , Average or above average intelligence , Capable of independent living , Communication skills , Contractor , General fund of knowledge , Motivation for treatment/growth , Religious Affiliation , and Supportive family/friends     SUMMARY: Current SDOH Barriers:  No SDOH barriers identified.   Clinical Social Work Clinical Goal(s):  Patient and CSW will explore any available financial assistance.   Interventions: Discussed common feeling and emotions when being diagnosed with cancer, and the importance of support during treatment Informed patient of the support team roles and support services at [facility] Provided CSW contact information and encouraged patient to call with any questions or concerns Referral placed for DSS eligibility caseworker to explore Medicaid Insurance  CSW provided information about the [employer]. Patient does not meet presumptive eligibility at this time. CSW provided SNAP application, patient will return application to CSW once complete.   Follow Up Plan: No follow up scheduled at this time. CSW will follow patient as he begins treatment Patient verbalizes understanding of plan: Yes  Lizbeth Sprague, LCSW Clinical Social Worker [facility]

## 2024-07-14 ENCOUNTER — Emergency Department (HOSPITAL_BASED_OUTPATIENT_CLINIC_OR_DEPARTMENT_OTHER)

## 2024-07-14 ENCOUNTER — Other Ambulatory Visit: Payer: Self-pay

## 2024-07-14 ENCOUNTER — Inpatient Hospital Stay (HOSPITAL_BASED_OUTPATIENT_CLINIC_OR_DEPARTMENT_OTHER)
Admission: EM | Admit: 2024-07-14 | Discharge: 2024-07-27 | DRG: 193 | Disposition: A | Discharge status: Skilled Nursing Facility | Attending: Internal Medicine | Admitting: Internal Medicine

## 2024-07-14 ENCOUNTER — Encounter (HOSPITAL_COMMUNITY): Payer: Self-pay | Admitting: Internal Medicine

## 2024-07-14 DIAGNOSIS — I7121 Aneurysm of the ascending aorta, without rupture: Secondary | ICD-10-CM | POA: Diagnosis not present

## 2024-07-14 DIAGNOSIS — N1831 Chronic kidney disease, stage 3a: Secondary | ICD-10-CM | POA: Diagnosis present

## 2024-07-14 DIAGNOSIS — M6281 Muscle weakness (generalized): Secondary | ICD-10-CM | POA: Diagnosis not present

## 2024-07-14 DIAGNOSIS — I517 Cardiomegaly: Secondary | ICD-10-CM | POA: Diagnosis not present

## 2024-07-14 DIAGNOSIS — Z86711 Personal history of pulmonary embolism: Secondary | ICD-10-CM

## 2024-07-14 DIAGNOSIS — R2689 Other abnormalities of gait and mobility: Secondary | ICD-10-CM | POA: Diagnosis not present

## 2024-07-14 DIAGNOSIS — I130 Hypertensive heart and chronic kidney disease with heart failure and stage 1 through stage 4 chronic kidney disease, or unspecified chronic kidney disease: Secondary | ICD-10-CM | POA: Diagnosis present

## 2024-07-14 DIAGNOSIS — Z7985 Long-term (current) use of injectable non-insulin antidiabetic drugs: Secondary | ICD-10-CM

## 2024-07-14 DIAGNOSIS — J9811 Atelectasis: Secondary | ICD-10-CM | POA: Diagnosis not present

## 2024-07-14 DIAGNOSIS — Z8249 Family history of ischemic heart disease and other diseases of the circulatory system: Secondary | ICD-10-CM

## 2024-07-14 DIAGNOSIS — Z8679 Personal history of other diseases of the circulatory system: Secondary | ICD-10-CM

## 2024-07-14 DIAGNOSIS — R0902 Hypoxemia: Secondary | ICD-10-CM | POA: Diagnosis not present

## 2024-07-14 DIAGNOSIS — C169 Malignant neoplasm of stomach, unspecified: Secondary | ICD-10-CM | POA: Diagnosis not present

## 2024-07-14 DIAGNOSIS — J9622 Acute and chronic respiratory failure with hypercapnia: Secondary | ICD-10-CM | POA: Diagnosis present

## 2024-07-14 DIAGNOSIS — Z7951 Long term (current) use of inhaled steroids: Secondary | ICD-10-CM

## 2024-07-14 DIAGNOSIS — Z833 Family history of diabetes mellitus: Secondary | ICD-10-CM

## 2024-07-14 DIAGNOSIS — E1122 Type 2 diabetes mellitus with diabetic chronic kidney disease: Secondary | ICD-10-CM | POA: Diagnosis present

## 2024-07-14 DIAGNOSIS — J9621 Acute and chronic respiratory failure with hypoxia: Secondary | ICD-10-CM | POA: Diagnosis present

## 2024-07-14 DIAGNOSIS — E7800 Pure hypercholesterolemia, unspecified: Secondary | ICD-10-CM | POA: Diagnosis present

## 2024-07-14 DIAGNOSIS — Z823 Family history of stroke: Secondary | ICD-10-CM

## 2024-07-14 DIAGNOSIS — J9601 Acute respiratory failure with hypoxia: Secondary | ICD-10-CM | POA: Diagnosis present

## 2024-07-14 DIAGNOSIS — Z9981 Dependence on supplemental oxygen: Secondary | ICD-10-CM

## 2024-07-14 DIAGNOSIS — E66812 Obesity, class 2: Secondary | ICD-10-CM | POA: Diagnosis not present

## 2024-07-14 DIAGNOSIS — L439 Lichen planus, unspecified: Secondary | ICD-10-CM | POA: Diagnosis present

## 2024-07-14 DIAGNOSIS — E1140 Type 2 diabetes mellitus with diabetic neuropathy, unspecified: Secondary | ICD-10-CM | POA: Diagnosis present

## 2024-07-14 DIAGNOSIS — D500 Iron deficiency anemia secondary to blood loss (chronic): Secondary | ICD-10-CM | POA: Diagnosis present

## 2024-07-14 DIAGNOSIS — Z6839 Body mass index (BMI) 39.0-39.9, adult: Secondary | ICD-10-CM | POA: Diagnosis not present

## 2024-07-14 DIAGNOSIS — G8929 Other chronic pain: Secondary | ICD-10-CM | POA: Diagnosis present

## 2024-07-14 DIAGNOSIS — Z79899 Other long term (current) drug therapy: Secondary | ICD-10-CM

## 2024-07-14 DIAGNOSIS — E782 Mixed hyperlipidemia: Secondary | ICD-10-CM | POA: Diagnosis present

## 2024-07-14 DIAGNOSIS — I5032 Chronic diastolic (congestive) heart failure: Secondary | ICD-10-CM | POA: Diagnosis not present

## 2024-07-14 DIAGNOSIS — Z66 Do not resuscitate: Secondary | ICD-10-CM | POA: Diagnosis present

## 2024-07-14 DIAGNOSIS — Z96653 Presence of artificial knee joint, bilateral: Secondary | ICD-10-CM | POA: Diagnosis present

## 2024-07-14 DIAGNOSIS — J45909 Unspecified asthma, uncomplicated: Secondary | ICD-10-CM | POA: Diagnosis present

## 2024-07-14 DIAGNOSIS — C779 Secondary and unspecified malignant neoplasm of lymph node, unspecified: Secondary | ICD-10-CM | POA: Diagnosis present

## 2024-07-14 DIAGNOSIS — E662 Morbid (severe) obesity with alveolar hypoventilation: Secondary | ICD-10-CM | POA: Diagnosis not present

## 2024-07-14 DIAGNOSIS — I2699 Other pulmonary embolism without acute cor pulmonale: Secondary | ICD-10-CM | POA: Diagnosis not present

## 2024-07-14 DIAGNOSIS — I10 Essential (primary) hypertension: Secondary | ICD-10-CM | POA: Diagnosis not present

## 2024-07-14 DIAGNOSIS — Z794 Long term (current) use of insulin: Secondary | ICD-10-CM

## 2024-07-14 DIAGNOSIS — I712 Thoracic aortic aneurysm, without rupture, unspecified: Secondary | ICD-10-CM | POA: Diagnosis present

## 2024-07-14 DIAGNOSIS — C772 Secondary and unspecified malignant neoplasm of intra-abdominal lymph nodes: Secondary | ICD-10-CM | POA: Diagnosis present

## 2024-07-14 DIAGNOSIS — E119 Type 2 diabetes mellitus without complications: Secondary | ICD-10-CM | POA: Diagnosis not present

## 2024-07-14 DIAGNOSIS — Z9841 Cataract extraction status, right eye: Secondary | ICD-10-CM

## 2024-07-14 DIAGNOSIS — R0602 Shortness of breath: Secondary | ICD-10-CM | POA: Diagnosis not present

## 2024-07-14 DIAGNOSIS — Z9842 Cataract extraction status, left eye: Secondary | ICD-10-CM

## 2024-07-14 DIAGNOSIS — C165 Malignant neoplasm of lesser curvature of stomach, unspecified: Secondary | ICD-10-CM | POA: Diagnosis present

## 2024-07-14 DIAGNOSIS — J9611 Chronic respiratory failure with hypoxia: Secondary | ICD-10-CM | POA: Diagnosis present

## 2024-07-14 DIAGNOSIS — Z961 Presence of intraocular lens: Secondary | ICD-10-CM | POA: Diagnosis present

## 2024-07-14 DIAGNOSIS — J986 Disorders of diaphragm: Secondary | ICD-10-CM | POA: Diagnosis present

## 2024-07-14 DIAGNOSIS — Z1152 Encounter for screening for COVID-19: Secondary | ICD-10-CM | POA: Diagnosis not present

## 2024-07-14 DIAGNOSIS — G4733 Obstructive sleep apnea (adult) (pediatric): Secondary | ICD-10-CM | POA: Diagnosis present

## 2024-07-14 DIAGNOSIS — J181 Lobar pneumonia, unspecified organism: Principal | ICD-10-CM | POA: Insufficient documentation

## 2024-07-14 DIAGNOSIS — I5033 Acute on chronic diastolic (congestive) heart failure: Secondary | ICD-10-CM | POA: Diagnosis not present

## 2024-07-14 DIAGNOSIS — J9602 Acute respiratory failure with hypercapnia: Secondary | ICD-10-CM | POA: Diagnosis not present

## 2024-07-14 DIAGNOSIS — J189 Pneumonia, unspecified organism: Principal | ICD-10-CM | POA: Insufficient documentation

## 2024-07-14 DIAGNOSIS — I5031 Acute diastolic (congestive) heart failure: Secondary | ICD-10-CM | POA: Diagnosis not present

## 2024-07-14 DIAGNOSIS — Z87891 Personal history of nicotine dependence: Secondary | ICD-10-CM

## 2024-07-14 DIAGNOSIS — Z79891 Long term (current) use of opiate analgesic: Secondary | ICD-10-CM

## 2024-07-14 DIAGNOSIS — Z9079 Acquired absence of other genital organ(s): Secondary | ICD-10-CM

## 2024-07-14 DIAGNOSIS — N400 Enlarged prostate without lower urinary tract symptoms: Secondary | ICD-10-CM | POA: Diagnosis present

## 2024-07-14 DIAGNOSIS — R918 Other nonspecific abnormal finding of lung field: Secondary | ICD-10-CM | POA: Diagnosis not present

## 2024-07-14 DIAGNOSIS — I2510 Atherosclerotic heart disease of native coronary artery without angina pectoris: Secondary | ICD-10-CM | POA: Diagnosis not present

## 2024-07-14 DIAGNOSIS — Z85028 Personal history of other malignant neoplasm of stomach: Secondary | ICD-10-CM

## 2024-07-14 DIAGNOSIS — Z841 Family history of disorders of kidney and ureter: Secondary | ICD-10-CM

## 2024-07-14 DIAGNOSIS — R590 Localized enlarged lymph nodes: Secondary | ICD-10-CM | POA: Diagnosis not present

## 2024-07-14 LAB — URINALYSIS, ROUTINE W REFLEX MICROSCOPIC
Bilirubin Urine: NEGATIVE
Glucose, UA: NEGATIVE mg/dL
Hgb urine dipstick: NEGATIVE
Ketones, ur: NEGATIVE mg/dL
Leukocytes,Ua: NEGATIVE
Nitrite: NEGATIVE
Protein, ur: NEGATIVE mg/dL
Specific Gravity, Urine: 1.044 — ABNORMAL HIGH (ref 1.005–1.030)
pH: 5 (ref 5.0–8.0)

## 2024-07-14 LAB — BASIC METABOLIC PANEL WITH GFR
Anion gap: 10 (ref 5–15)
BUN: 12 mg/dL (ref 8–23)
CO2: 25 mmol/L (ref 22–32)
Calcium: 8.8 mg/dL — ABNORMAL LOW (ref 8.9–10.3)
Chloride: 103 mmol/L (ref 98–111)
Creatinine, Ser: 1.4 mg/dL — ABNORMAL HIGH (ref 0.61–1.24)
GFR, Estimated: 50 mL/min — ABNORMAL LOW (ref >60)
Glucose, Bld: 127 mg/dL — ABNORMAL HIGH (ref 70–99)
Potassium: 4.1 mmol/L (ref 3.5–5.1)
Sodium: 138 mmol/L (ref 135–145)

## 2024-07-14 LAB — CBC
HCT: 31 % — ABNORMAL LOW (ref 39.0–52.0)
Hemoglobin: 8.9 g/dL — ABNORMAL LOW (ref 13.0–17.0)
MCH: 22.3 pg — ABNORMAL LOW (ref 26.0–34.0)
MCHC: 28.7 g/dL — ABNORMAL LOW (ref 30.0–36.0)
MCV: 77.5 fL — ABNORMAL LOW (ref 80.0–100.0)
Platelets: 233 K/uL (ref 150–400)
RBC: 4 MIL/uL — ABNORMAL LOW (ref 4.22–5.81)
RDW: 21.5 % — ABNORMAL HIGH (ref 11.5–15.5)
WBC: 8.3 K/uL (ref 4.0–10.5)
nRBC: 0 % (ref 0.0–0.2)

## 2024-07-14 LAB — PRO BRAIN NATRIURETIC PEPTIDE: Pro Brain Natriuretic Peptide: 237 pg/mL (ref <300.0)

## 2024-07-14 LAB — I-STAT ARTERIAL BLOOD GAS, ED
Acid-base deficit: 2 mmol/L (ref 0.0–2.0)
Bicarbonate: 25.6 mmol/L (ref 20.0–28.0)
Calcium, Ion: 1.21 mmol/L (ref 1.15–1.40)
HCT: 31 % — ABNORMAL LOW (ref 39.0–52.0)
Hemoglobin: 10.5 g/dL — ABNORMAL LOW (ref 13.0–17.0)
O2 Saturation: 90 %
Patient temperature: 98.6
Potassium: 4 mmol/L (ref 3.5–5.1)
Sodium: 139 mmol/L (ref 135–145)
TCO2: 27 mmol/L (ref 22–32)
pCO2 arterial: 54 mmHg — ABNORMAL HIGH (ref 32–48)
pH, Arterial: 7.285 — ABNORMAL LOW (ref 7.35–7.45)
pO2, Arterial: 66 mmHg — ABNORMAL LOW (ref 83–108)

## 2024-07-14 LAB — PROTIME-INR
INR: 1 (ref 0.8–1.2)
Prothrombin Time: 13.5 s (ref 11.4–15.2)

## 2024-07-14 LAB — MRSA NEXT GEN BY PCR, NASAL: MRSA by PCR Next Gen: NOT DETECTED

## 2024-07-14 LAB — RESP PANEL BY RT-PCR (RSV, FLU A&B, COVID)  RVPGX2
Influenza A by PCR: NEGATIVE
Influenza B by PCR: NEGATIVE
Resp Syncytial Virus by PCR: NEGATIVE
SARS Coronavirus 2 by RT PCR: NEGATIVE

## 2024-07-14 LAB — TYPE AND SCREEN
ABO/RH(D): A POS
Antibody Screen: NEGATIVE

## 2024-07-14 LAB — GLUCOSE, CAPILLARY: Glucose-Capillary: 117 mg/dL — ABNORMAL HIGH (ref 70–99)

## 2024-07-14 MED ORDER — HYDROMORPHONE HCL 1 MG/ML IJ SOLN
1.0000 mg | INTRAMUSCULAR | Status: DC | PRN
  Filled 2024-07-14: qty 1

## 2024-07-14 MED ORDER — ACETAMINOPHEN 650 MG RE SUPP: 650.0000 mg | Freq: Four times a day (QID) | RECTAL | Status: DC | PRN

## 2024-07-14 MED ORDER — SIMVASTATIN 40 MG PO TABS
40.0000 mg | ORAL_TABLET | Freq: Every evening | ORAL | Status: DC
  Administered 2024-07-15 – 2024-07-26 (×12): 40 mg via ORAL
  Filled 2024-07-14 (×12): qty 1

## 2024-07-14 MED ORDER — SODIUM CHLORIDE 0.9 % IV SOLN
1.0000 g | Freq: Once | INTRAVENOUS | Status: AC
  Administered 2024-07-14: 1 g via INTRAVENOUS
  Filled 2024-07-14: qty 10

## 2024-07-14 MED ORDER — IOHEXOL 350 MG/ML SOLN
100.0000 mL | Freq: Once | INTRAVENOUS | Status: AC | PRN
  Administered 2024-07-14: 100 mL via INTRAVENOUS

## 2024-07-14 MED ORDER — FERROUS SULFATE 325 (65 FE) MG PO TABS
325.0000 mg | ORAL_TABLET | Freq: Every day | ORAL | Status: DC
  Administered 2024-07-15: 325 mg via ORAL
  Filled 2024-07-14: qty 1

## 2024-07-14 MED ORDER — HYDRALAZINE HCL 20 MG/ML IJ SOLN
10.0000 mg | INTRAMUSCULAR | Status: DC | PRN
  Administered 2024-07-15 – 2024-07-18 (×2): 10 mg via INTRAVENOUS
  Filled 2024-07-14 (×3): qty 1

## 2024-07-14 MED ORDER — FUROSEMIDE 20 MG PO TABS
20.0000 mg | ORAL_TABLET | Freq: Every day | ORAL | Status: DC
  Administered 2024-07-15: 20 mg via ORAL
  Filled 2024-07-14: qty 1

## 2024-07-14 MED ORDER — SODIUM CHLORIDE 0.9 % IV SOLN
500.0000 mg | INTRAVENOUS | Status: AC
  Administered 2024-07-15 – 2024-07-18 (×4): 500 mg via INTRAVENOUS
  Filled 2024-07-14 (×4): qty 5

## 2024-07-14 MED ORDER — ISOSORB DINITRATE-HYDRALAZINE 20-37.5 MG PO TABS
2.0000 | ORAL_TABLET | Freq: Three times a day (TID) | ORAL | Status: DC
  Administered 2024-07-15 – 2024-07-27 (×36): 2 via ORAL
  Filled 2024-07-14 (×41): qty 2

## 2024-07-14 MED ORDER — GUAIFENESIN ER 600 MG PO TB12
600.0000 mg | ORAL_TABLET | Freq: Two times a day (BID) | ORAL | Status: DC
  Administered 2024-07-15 – 2024-07-27 (×25): 600 mg via ORAL
  Filled 2024-07-14 (×25): qty 1

## 2024-07-14 MED ORDER — ONDANSETRON HCL 4 MG/2ML IJ SOLN: 4.0000 mg | Freq: Four times a day (QID) | INTRAMUSCULAR | Status: DC | PRN

## 2024-07-14 MED ORDER — ACETAMINOPHEN 325 MG PO TABS
650.0000 mg | ORAL_TABLET | Freq: Four times a day (QID) | ORAL | Status: DC | PRN
  Administered 2024-07-15 – 2024-07-16 (×4): 650 mg via ORAL
  Filled 2024-07-14 (×4): qty 2

## 2024-07-14 MED ORDER — SPIRONOLACTONE 25 MG PO TABS
25.0000 mg | ORAL_TABLET | Freq: Every day | ORAL | Status: DC
  Administered 2024-07-15: 25 mg via ORAL
  Filled 2024-07-14: qty 1

## 2024-07-14 MED ORDER — FLUTICASONE FUROATE-VILANTEROL 200-25 MCG/ACT IN AEPB
1.0000 | INHALATION_SPRAY | Freq: Every day | RESPIRATORY_TRACT | Status: DC
  Administered 2024-07-15 – 2024-07-27 (×13): 1 via RESPIRATORY_TRACT
  Filled 2024-07-14: qty 28

## 2024-07-14 MED ORDER — PANTOPRAZOLE SODIUM 40 MG PO TBEC
40.0000 mg | DELAYED_RELEASE_TABLET | Freq: Every day | ORAL | Status: DC
  Administered 2024-07-15 – 2024-07-27 (×13): 40 mg via ORAL
  Filled 2024-07-14 (×13): qty 1

## 2024-07-14 MED ORDER — HYDROCODONE-ACETAMINOPHEN 5-325 MG PO TABS
1.0000 | ORAL_TABLET | Freq: Two times a day (BID) | ORAL | Status: DC
  Filled 2024-07-14: qty 1

## 2024-07-14 MED ORDER — ALBUTEROL SULFATE (2.5 MG/3ML) 0.083% IN NEBU: 2.5000 mg | INHALATION_SOLUTION | Freq: Four times a day (QID) | RESPIRATORY_TRACT | Status: DC | PRN

## 2024-07-14 MED ORDER — CHLORHEXIDINE GLUCONATE CLOTH 2 % EX PADS
6.0000 | MEDICATED_PAD | Freq: Every day | CUTANEOUS | Status: DC
  Administered 2024-07-14: 6 via TOPICAL

## 2024-07-14 MED ORDER — ONDANSETRON HCL 4 MG PO TABS: 4.0000 mg | ORAL_TABLET | Freq: Four times a day (QID) | ORAL | Status: DC | PRN

## 2024-07-14 MED ORDER — SODIUM CHLORIDE 0.9 % IV SOLN: 1.0000 g | INTRAVENOUS | Status: DC

## 2024-07-14 MED ORDER — SODIUM CHLORIDE 0.9 % IV SOLN
2.0000 g | INTRAVENOUS | Status: DC
  Administered 2024-07-15 – 2024-07-19 (×5): 2 g via INTRAVENOUS
  Filled 2024-07-14 (×5): qty 20

## 2024-07-14 MED ORDER — INSULIN ASPART 100 UNIT/ML IJ SOLN
0.0000 [IU] | Freq: Three times a day (TID) | INTRAMUSCULAR | Status: DC
  Administered 2024-07-15 – 2024-07-18 (×6): 1 [IU] via SUBCUTANEOUS
  Administered 2024-07-18: 2 [IU] via SUBCUTANEOUS
  Administered 2024-07-18 – 2024-07-19 (×2): 1 [IU] via SUBCUTANEOUS
  Administered 2024-07-20: 2 [IU] via SUBCUTANEOUS
  Administered 2024-07-21: 1 [IU] via SUBCUTANEOUS

## 2024-07-14 MED ORDER — CARVEDILOL 25 MG PO TABS
25.0000 mg | ORAL_TABLET | Freq: Two times a day (BID) | ORAL | Status: DC
  Administered 2024-07-15 – 2024-07-27 (×25): 25 mg via ORAL
  Filled 2024-07-14 (×2): qty 2
  Filled 2024-07-14: qty 1
  Filled 2024-07-14 (×2): qty 2
  Filled 2024-07-14: qty 1
  Filled 2024-07-14: qty 2
  Filled 2024-07-14 (×5): qty 1
  Filled 2024-07-14 (×7): qty 2
  Filled 2024-07-14: qty 1
  Filled 2024-07-14 (×5): qty 2

## 2024-07-14 MED ORDER — LOSARTAN POTASSIUM 50 MG PO TABS
100.0000 mg | ORAL_TABLET | Freq: Every evening | ORAL | Status: DC
  Administered 2024-07-15: 100 mg via ORAL
  Filled 2024-07-14: qty 2

## 2024-07-14 MED ORDER — SODIUM CHLORIDE 0.9 % IV SOLN
500.0000 mg | Freq: Once | INTRAVENOUS | Status: AC
  Administered 2024-07-14: 500 mg via INTRAVENOUS
  Filled 2024-07-14: qty 5

## 2024-07-14 MED ORDER — SENNOSIDES-DOCUSATE SODIUM 8.6-50 MG PO TABS
1.0000 | ORAL_TABLET | Freq: Every evening | ORAL | Status: DC | PRN
  Administered 2024-07-17 – 2024-07-18 (×2): 1 via ORAL
  Filled 2024-07-14 (×2): qty 1

## 2024-07-14 MED ORDER — ORAL CARE MOUTH RINSE
15.0000 mL | OROMUCOSAL | Status: DC
  Administered 2024-07-15 – 2024-07-16 (×8): 15 mL via OROMUCOSAL

## 2024-07-14 MED ORDER — SODIUM CHLORIDE 0.9% FLUSH
3.0000 mL | Freq: Two times a day (BID) | INTRAVENOUS | Status: DC
  Administered 2024-07-14 – 2024-07-27 (×26): 3 mL via INTRAVENOUS

## 2024-07-14 MED ORDER — ORAL CARE MOUTH RINSE: 15.0000 mL | OROMUCOSAL | Status: DC | PRN

## 2024-07-14 NOTE — Progress Notes (Signed)
 PT was started on bipap per MD order.  PT arrived via [facility] at 1830 on 100% NRB with sats of 88%. PT is on 12/6, R 18, and 80%.  MD at bedside.

## 2024-07-14 NOTE — ED Provider Notes (Signed)
 I provided a substantive portion of the care of this patient.  I personally made/approved the management plan for this patient and take responsibility for the patient management. {Remember to document shared critical care using "edcritical" dot phrase:1}

## 2024-07-14 NOTE — ED Notes (Signed)
 Called [facility] to transport the patient to [facility] 2[facility] rm# [id number]

## 2024-07-14 NOTE — H&P (Signed)
 History and Physical    Bradley Alvarez:[id number] DOB: November 29, 1941 DOA: 07/14/2024  PCP: Bradley Norleen, MD  Patient coming from: Home  I have personally briefly reviewed patient's old medical records in [facility]  Chief Complaint: Shortness of breath  HPI: Bradley Alvarez is a 83 y.o. 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  Patient recently diagnosed with gastric cancer by EGD on 8/1 which showed a large fungating and ulcerated mass at the lower curvature of the stomach.  Pathology consistent with invasive moderately differentiated adenocarcinoma.  He is following with oncology and has a PET scan scheduled for 8/19.  Patient states for the last 2 weeks he has been progressively short of breath with cough occasionally productive of dark sputum.  Today his dyspnea significantly worsened.  His spouse checked his home oxygen  level and saw that it was in the 70s.  Patient went to the ED for further evaluation.  Per ED triage documentation SpO2 was 75% when he got in the room and he was placed on NRB.  On transfer to stepdown unit, SpO2 was 86% while on 15 L NRB.  He was subsequently placed on BiPAP with improvement.  Patient is feeling better while on BiPAP.  He denies recent fevers, chills, diaphoresis, chest pain.  He reports chronic lower extremity swelling which is improved from baseline.  He says he was recently taken off Lasix  and his spironolactone  was cut in half due to low blood pressures.  [facility] ED Course  Labs/Imaging on admission: I have personally reviewed following labs and imaging studies.  Initial vitals showed BP 136/75,  pulse 73, RR 21, temp 90.6 F, SpO2 95% on 15 L NRB 10  Labs showed WBC 8.3, hemoglobin 8.9, platelets 233, sodium 138, potassium 4.1, bicarb 25, 12.21.40, serum glucose 127, proBNP 237.  ABG showed pH 7.25, PCO254, PO266.  SARS-CoV-2, follicularly PCR negative.  CTA chest negative for acute PE to the level of the proximal segmental pulmonary arteries.  New peripheral left lower lobe consolidation suspicious for pneumonia.  Mild interlobular septal thickening with scattered peribronchovascular groundglass opacities.  Dilated main pulmonary artery measuring 4.5 cm.  Unchanged mediastinal and right hilar adenopathy, likely reactive.  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  Patient was given IV ceftriaxone  and azithromycin .  The hospitalist service was consulted to admit.  Review of Systems: All systems reviewed and are negative except as documented in history of present illness above.   Past Medical History:  Diagnosis Date   AAA (abdominal aortic aneurysm) without rupture (HCC)    followed by dr brain--  per office note dated 11-05-2018  3.5cm   Benign essential HTN    cardiologist-- dr tally   Chronic combined systolic (congestive) and diastolic (congestive) heart failure [facility])    cardiologist-- dr katharyn   CKD (chronic kidney disease), stage III (HCC)    Diabetes mellitus type 2, insulin  dependent (HCC)    followed by dr Bradley   Diabetic neuropathy (HCC)    DOE (dyspnea on exertion)    Edema of both lower extremities    Full dentures    HOH (hard of hearing)    does wear  hearing aids   Hydrocele, bilateral    Lichen planus    Mixed hyperlipidemia    Morbid obesity with BMI of 45.0-49.9, adult (HCC)    Nocturia    OA (osteoarthritis)    OSA on CPAP 10/14/08   per study  AHI  61.8/hr ,  RDI  96.1/hr.     Past Surgical History:  Procedure Laterality Date   CARDIAC CATHETERIZATION  per pt yrs ago   was told no blockage   CARDIOVASCULAR STRESS TEST   07-19-2014   dr debby sor   Low risk nuclear study w/ small apical attentuation artifact/  normal LV function and wall motion , ef 53%   CATARACT EXTRACTION W/ INTRAOCULAR LENS  IMPLANT, BILATERAL  2018   COLONOSCOPY W/ BIOPSIES AND POLYPECTOMY     COLONOSCOPY WITH PROPOFOL  N/A 05/16/2016   Procedure: COLONOSCOPY WITH PROPOFOL ;  Surgeon: [name], MD;  Location: [facility];  Service: Endoscopy;  Laterality: N/A;   ESOPHAGOGASTRODUODENOSCOPY N/A 07/01/2024   Procedure: EGD (ESOPHAGOGASTRODUODENOSCOPY);  Surgeon: Just Belvie, MD;  Location: [facility];  Service: Gastroenterology;  Laterality: N/A;   HYDROCELE EXCISION Bilateral 11/15/2018   Procedure: HYDROCELECTOMY ADULT;  Surgeon: [name], [name], MD;  Location: [facility];  Service: Urology;  Laterality: Bilateral;   KNEE ARTHROSCOPY Right 2009   MULTIPLE TOOTH EXTRACTIONS     ORIF ANKLE FRACTURE Left 09/13/2014   Procedure: Open Reduction Internal Fixation Left Ankle Medial Malleolus;  Surgeon: [name], MD;  Location: [facility] OR;  Service: Orthopedics;  Laterality: Left;   PROSTATECTOMY  1993 approx.  by dr matilda   for prostate enlargement   TOTAL KNEE ARTHROPLASTY Right 08/27/2009   dr jane @[facility]    TOTAL KNEE ARTHROPLASTY  12/20/2012   Procedure: TOTAL KNEE ARTHROPLASTY;  Surgeon: [name], MD;  Location: [facility] OR;  Service: Orthopedics;  Laterality: Left;  left total knee arthroplasty   TRANSTHORACIC ECHOCARDIOGRAM  11-04-2018   dr patwardhan   moderate to severe concentric LVH, ef 56%,  grade 1 diastolic dysfunciton/  mild AV calcification annulus and leaflets without stentosis ,  mild regurg./  moderate LAE/ dilated aortic root, 4.5cm/  mild MV annulus calcification with trace regurg./  trace TR    Social History: Social History   Tobacco Use   Smoking status: Former    Current packs/day: 0.00    Average packs/day: 1.5 packs/day for 10.0 years (15.0 ttl pk-yrs)    Types: Cigarettes    Start date:  12/13/1961    Quit date: 12/14/1971    Years since quitting: 52.6   Smokeless tobacco: Former    Types: Snuff, Chew  Vaping Use   Vaping status: Never Used  Substance Use Topics   Alcohol  use: Yes    Alcohol /week: 1.0 standard drink of alcohol     Types: 1 Standard drinks or equivalent per week    Comment: one shot qhs   Drug use: No   No Known Allergies  Family History  Problem Relation Age of Onset   Hypertension Mother    Diabetes Mellitus II Mother    Heart attack Father    Diabetes Mellitus II Sister    Heart disease Sister    Diabetes Mellitus II Brother    Stroke Brother    Diabetes Mellitus II Brother    Kidney disease Brother    Diabetes Mellitus II Brother    Diabetes Mellitus II Sister    Heart disease Sister    Hypertension Other    Diabetes  Mellitus II Other    Stroke Other    Heart disease Other      Prior to Admission medications   Medication Sig Start Date End Date Taking? Authorizing Provider  albuterol  (VENTOLIN  HFA) 108 (90 Base) MCG/ACT inhaler Inhale 2 puffs into the lungs every 6 (six) hours as needed for wheezing or shortness of breath. 01/04/24   Hunsucker, Donnice SAUNDERS, MD  BIDIL  20-37.5 MG tablet Take 2 tablets by mouth 3 (three) times daily. 05/31/24   Ladona Heinz, MD  carvedilol  (COREG ) 25 MG tablet TAKE TWO TABLETS BY MOUTH TWICE DAILY with meals Patient taking differently: Take 25 mg by mouth 2 (two) times daily with a meal. TAKE TWO TABLETS BY MOUTH TWICE DAILY with meals 05/01/22   Cantwell, Celeste C, PA-C  clobetasol  ointment (TEMOVATE ) 0.05 % Apply 1 application topically daily as needed (for itching).  09/23/13   [provider]  Cyanocobalamin  2500 MCG TABS Take 500 mcg by mouth daily.     [provider]  diclofenac sodium (VOLTAREN) 1 % GEL Apply 1 application topically 4 (four) times daily as needed (For pain.).    [provider]  Dulaglutide (TRULICITY) 0.75 MG/0.5ML SOAJ Inject 0.75 mg into the skin once a week. On  Wed.    [provider]  Dulaglutide 1.5 MG/0.5ML SOPN Inject 0.75 Doses into the skin once a week. Wednesday's / PT TAKING 0.75    [provider]  felodipine  (PLENDIL ) 5 MG 24 hr tablet TAKE TWO TABLETS BY MOUTH DAILY 12/24/21   Ladona Heinz, MD  ferrous sulfate  325 (65 FE) MG tablet Take 325 mg by mouth daily with breakfast.    [provider]  fluticasone -salmeterol (ADVAIR) 250-50 MCG/ACT AEPB Inhale one puff by mouth into the lungs in the morning and at bedtime. 07/12/24   Hunsucker, Donnice SAUNDERS, MD  HYDROcodone -acetaminophen  (NORCO/VICODIN) 5-325 MG tablet 2 (two) times daily. 01/06/19   [provider]  loratadine (CLARITIN) 10 MG tablet Take 10 mg by mouth every morning.     [provider]  losartan  (COZAAR ) 100 MG tablet Take 100 mg by mouth every evening.  07/27/17   [provider]  MEGARED OMEGA-3 KRILL OIL PO Take 1 tablet by mouth daily.    [provider]  methocarbamol  (ROBAXIN ) 500 MG tablet Take 500 mg by mouth at bedtime as needed for muscle spasms. 01/28/24   [provider]  Multiple Vitamin (MULTIVITAMIN WITH MINERALS) TABS tablet Take 1 tablet by mouth daily.    [provider]  omeprazole (PRILOSEC) 20 MG capsule Take 20 mg by mouth daily.    [provider]  AISHA SINKS test strip USE TO TEST BID AS DIRECTED 03/20/16   [provider]  Polyethyl Glycol-Propyl Glycol (SYSTANE OP) Apply to eye as needed.    [provider]  simvastatin  (ZOCOR ) 40 MG tablet Take 40 mg by mouth every evening.    [provider]  spironolactone  (ALDACTONE ) 50 MG tablet Take 0.5 tablets (25 mg total) by mouth daily. 06/06/24   Patwardhan, Newman PARAS, MD  Turmeric 400 MG CAPS Take by mouth as needed.    [provider]  urea (CARMOL) 10 % cream Apply topically as needed.    [provider]    Physical Exam: Vitals:   07/14/24 2000 07/14/24 2100 07/14/24 2112 07/14/24  2136  BP: (!) 163/77   (!) 190/87  Pulse: 60 62  (!) 57  Resp: 20 19 (!) 34 19  Temp:      TempSrc:      SpO2: 94% 94%  96%  Weight:      Height:       Constitutional: Sitting up in bed while wearing BiPAP.  NAD, calm, comfortable.  Speaking in full clear sentences. Eyes: EOMI, lids and conjunctivae normal ENMT: Mucous membranes are moist. Posterior pharynx clear of any exudate or lesions.Normal dentition.  Neck: normal, supple, no masses. Respiratory: clear to auscultation bilaterally, no wheezing, no crackles, normal respiratory effort while on BiPAP. No accessory muscle use.  Cardiovascular: Regular rate and rhythm, no murmurs / rubs / gallops.  Trace bilateral lower extremity edema. 2+ pedal pulses. Abdomen: no tenderness, no masses palpated. Musculoskeletal: no clubbing / cyanosis. No joint deformity upper and lower extremities. Good ROM, no contractures. Normal muscle tone.  Skin: no rashes, lesions, ulcers. No induration Neurologic: Sensation intact. Strength 5/5 in all 4.  Psychiatric: Normal judgment and insight. Alert and oriented x 3. Normal mood.   EKG: Personally reviewed. Sinus rhythm, rate 67, first-degree AV block.  Assessment/Plan Principal Problem:   Community acquired pneumonia of left lower lobe of lung Active Problems:   Acute respiratory failure with hypoxia (HCC)   Essential hypertension   Type 2 diabetes mellitus (HCC)   Hypercholesteremia   OSA (obstructive sleep apnea)   Chronic heart failure with preserved ejection fraction (HFpEF) (HCC)   History of pulmonary embolism   Asthma   Bradley Alvarez is a 83 y.o. 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  Assessment and Plan: Acute hypoxic respiratory failure due to community-acquired pneumonia of left lower lobe: Hypoxic to 70s at home.  On arrival to stepdown unit SpO2 was 88% while on 15 L NRB.  Placed on BiPAP with improvement.  CTA chest shows new left lower lobe consolidation.  Negative for PE.  White count is normal.  COVID, influenza, RSV negative. - Continue IV ceftriaxone  and azithromycin  - Obtain blood cultures, check strep pneumonia and Legionella urine antigens - Obtain full respiratory viral panel - Continue BiPAP as needed and wean to nasal cannula as able  Asthma: No wheezing noted on exam.  Continue Breo Ellipta  and albuterol  as needed.  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  Scheduled for PET/CT 8/19.  Hemoglobin is stable.  Continue iron supplement.  Chronic HFpEF: EF 50% by TTE 08/14/2023.  Recently Lasix  was changed from daily to as needed dosing and spironolactone  cut in half due to low blood pressures.  Appears to have some increasing peripheral edema on admission.  BP is elevated on admission. - Restart low-dose Lasix  20 mg daily - Continue spironolactone  25 mg daily - Continue Coreg  25 mg twice daily - Continue losartan  100 mg daily - Continue BiDil  20-37.5 mg 3 times daily - Strict I/O's and daily weights  History of pulmonary embolism: Now off Eliquis  due to GI bleeding.  Type 2 diabetes: Currently diet controlled.  Patient states he has been taken off all of his diabetes meds by his PCP due to improved glycemic control with occasional hypoglycemia.  Reports last A1c was <6.0%. - Placed on SSI  CKD stage  IIIa: Renal function stable.  Continue monitor.  Hypertension: Continue losartan , Coreg , spironolactone , BiDil .  Hyperlipidemia: Continue simvastatin .  OSA: Continue CPAP nightly.  Ascending thoracic aortic aneurysm: Seen again on  CT, measuring 4.8 cm.  Follows with CT surgery.  Chronic pain: Continue home Norco with hold parameters.   DVT prophylaxis: SCDs Start: 07/14/24 1857 Code Status:   Code Status: Limited: Do not attempt resuscitation (DNR) -DNR-LIMITED -Do Not Intubate/DNI confirmed with patient on admission.  Also discussed with daughter at bedside, spouse by phone. Family Communication: Daughter at bedside, spouse by phone Disposition Plan: From home, dispo pending clinical progress Consults called: None Severity of Illness: The appropriate patient status for this patient is INPATIENT. Inpatient status is judged to be reasonable and necessary in order to provide the required intensity of service to ensure the patient's safety. The patient's presenting symptoms, physical exam findings, and initial radiographic and laboratory data in the context of their chronic comorbidities is felt to place them at high risk for further clinical deterioration. Furthermore, it is not anticipated that the patient will be medically stable for discharge from the hospital within 2 midnights of admission.   * I certify that at the point of admission it is my clinical judgment that the patient will require inpatient hospital care spanning beyond 2 midnights from the point of admission due to high intensity of service, high risk for further deterioration and high frequency of surveillance required.DEWAINE Jorie Blanch MD [facility]  If 7PM-7AM, please contact night-coverage [email or web address]  07/14/2024, 9:46 PM

## 2024-07-14 NOTE — ED Triage Notes (Signed)
 Pt caox4 c/o increased SOB with low SpO2 at home today in the 70's per family. Pt hypoxic and tachypneic on arrival to ED. Pt's family also report recent cancer dx, pt was taken off lasix  and Eliquis  and spironolactone  cut in half. Extensive cardiac and pulmonary PMH.

## 2024-07-14 NOTE — Hospital Course (Addendum)
 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   Upon evaluation in the emergency department patient was noted to be suffering from acute hypoxic and hypercapnic respiratory failure secondary to left lower lobe pneumonia.  Requiring BiPAP.  Hospitalist was called to assess the patient for admission to hospital and was admitted to the stepdown unit.    Patient was placed on intravenous antibiotics with ceftriaxone  and azithromycin .  Blood cultures were obtained and were found to be unremarkable.  In the days that followed patient was weaned off BiPAP and transitioned to high flow nasal cannula.

## 2024-07-14 NOTE — Plan of Care (Signed)
 Plan of Care Note for accepted transfer   Patient: JERAMIA SALEEBY MRN: [id number]   DOA: 07/14/2024  Facility requesting transfer: [facility] Requesting Provider: Dorn Dec, PA Reason for transfer: PNA Facility course:  Mr. Haddix is an 83  yo male with recent diagnosis gastric cancer with large fungating, ulcerated mass.  Also history of IDA, PE (taken off Eliquis  due to gastric bleeding), CAD, CHF, diabetes, HTN, CKD, OSA. Following with oncology with potential plans for immunotherapy for treatment.  Has pulse oximeter at home and found to be in the 70s for SpO2.  He required escalation up to nonrebreather during evaluation with saturations in the low to mid 90s on nonrebreather. CT angio chest obtained which was negative for PE but showed left lower lobe consolidation concerning for pneumonia and some ground glass opacities.  COVID testing pending.  He was started on Rocephin  and azithromycin .  Remains full code.  ABG also being obtained.   Plan of care: The patient is accepted for admission to [facility] unit, at [facility].  Author: Alm Apo, MD 07/14/2024  Check [email or web address] for on-call coverage.  Nursing staff, Please call [facility] Admits & Consults System-Wide number on Amion as soon as patient's arrival, so appropriate admitting provider can evaluate the pt.

## 2024-07-14 NOTE — ED Notes (Addendum)
 Pt's oxygen  was 75% when he got in the room. RT placed the Pt on a NRB

## 2024-07-14 NOTE — ED Provider Notes (Signed)
 [place] EMERGENCY DEPARTMENT AT [facility] Provider Note   CSN: [id number] Arrival date & time: 07/14/24  1302     Patient presents with: Shortness of Breath   Bradley Alvarez is a 83 y.o. male who presents to the ED today out of concern for low pulse oximetry readings at home.  On their home pulse oximeter, on room air the patient was saturating in the low 70s.  He has oxygen  at home at baseline secondary to combined systolic and diastolic heart failure, placed this on him at home without a potential rise in his oxygen  levels.  Nasal cannula oxygen  increased his pulse oximetry to approximately 80 to 85%.  He has had an intermittent cough with sputum produced however there is no report of any noted hemoptysis.  They also state that he has right lower extremity edema however he has baseline increased right lower extremity edema and this does not appear to be swollen beyond his baseline.  The patient denies experiencing any pain, and has not shown any acute dyspnea or work of breathing.  Patient was recently diagnosed with a gastric malignancy, previously on Eliquis  secondary to previous pulmonary embolism, was removed from this in the beginning of July 2025 secondary to upper GI bleeding related to the malignancy.  Further review of previous diagnoses shows that he has have a diagnosis of a aortic aneurysm, as well as stage III CKD, type 2 diabetes, hyperlipidemia, osteoarthritis, and lichen planus.  He was also removed from his prescription for furosemide  approximately 1 month prior secondary to hypotension.    Shortness of Breath      Prior to Admission medications   Medication Sig Start Date End Date Taking? Authorizing Provider  albuterol  (VENTOLIN  HFA) 108 (90 Base) MCG/ACT inhaler Inhale 2 puffs into the lungs every 6 (six) hours as needed for wheezing or shortness of breath. 01/04/24   Hunsucker, Donnice SAUNDERS, MD  BIDIL  20-37.5 MG tablet Take 2 tablets by mouth 3 (three) times  daily. 05/31/24   Ladona Heinz, MD  carvedilol  (COREG ) 25 MG tablet TAKE TWO TABLETS BY MOUTH TWICE DAILY with meals Patient taking differently: Take 25 mg by mouth 2 (two) times daily with a meal. TAKE TWO TABLETS BY MOUTH TWICE DAILY with meals 05/01/22   Cantwell, Celeste C, PA-C  clobetasol  ointment (TEMOVATE ) 0.05 % Apply 1 application topically daily as needed (for itching).  09/23/13   [provider]  Cyanocobalamin  2500 MCG TABS Take 500 mcg by mouth daily.     [provider]  diclofenac sodium (VOLTAREN) 1 % GEL Apply 1 application topically 4 (four) times daily as needed (For pain.).    [provider]  Dulaglutide (TRULICITY) 0.75 MG/0.5ML SOAJ Inject 0.75 mg into the skin once a week. On Wed.    [provider]  Dulaglutide 1.5 MG/0.5ML SOPN Inject 0.75 Doses into the skin once a week. Wednesday's / PT TAKING 0.75    [provider]  felodipine  (PLENDIL ) 5 MG 24 hr tablet TAKE TWO TABLETS BY MOUTH DAILY 12/24/21   Ladona Heinz, MD  ferrous sulfate  325 (65 FE) MG tablet Take 325 mg by mouth daily with breakfast.    [provider]  fluticasone -salmeterol (ADVAIR) 250-50 MCG/ACT AEPB Inhale one puff by mouth into the lungs in the morning and at bedtime. 07/12/24   Hunsucker, Donnice SAUNDERS, MD  HYDROcodone -acetaminophen  (NORCO/VICODIN) 5-325 MG tablet 2 (two) times daily. 01/06/19   [provider]  loratadine (CLARITIN) 10 MG tablet Take  10 mg by mouth every morning.     [provider]  losartan  (COZAAR ) 100 MG tablet Take 100 mg by mouth every evening.  07/27/17   [provider]  MEGARED OMEGA-3 KRILL OIL PO Take 1 tablet by mouth daily.    [provider]  methocarbamol  (ROBAXIN ) 500 MG tablet Take 500 mg by mouth at bedtime as needed for muscle spasms. 01/28/24   [provider]  Multiple Vitamin (MULTIVITAMIN WITH MINERALS) TABS tablet Take 1 tablet by mouth daily.    [provider]   omeprazole (PRILOSEC) 20 MG capsule Take 20 mg by mouth daily.    [provider]  AISHA SINKS test strip USE TO TEST BID AS DIRECTED 03/20/16   [provider]  Polyethyl Glycol-Propyl Glycol (SYSTANE OP) Apply to eye as needed.    [provider]  simvastatin  (ZOCOR ) 40 MG tablet Take 40 mg by mouth every evening.    [provider]  spironolactone  (ALDACTONE ) 50 MG tablet Take 0.5 tablets (25 mg total) by mouth daily. 06/06/24   Patwardhan, Newman PARAS, MD  Turmeric 400 MG CAPS Take by mouth as needed.    [provider]  urea (CARMOL) 10 % cream Apply topically as needed.    [provider]    Allergies: Patient has no known allergies.    Review of Systems  Respiratory:  Positive for shortness of breath.   All other systems reviewed and are negative.   Updated Vital Signs BP 136/75   Pulse 66   Temp 98.6 F (37 C) (Oral)   Resp (!) 23   SpO2 96%   Physical Exam Vitals and nursing note reviewed.  Constitutional:      General: He is not in acute distress.    Appearance: Normal appearance.  HENT:     Head: Normocephalic and atraumatic.     Mouth/Throat:     Mouth: Mucous membranes are moist.     Pharynx: Oropharynx is clear.  Eyes:     Extraocular Movements: Extraocular movements intact.     Conjunctiva/sclera: Conjunctivae normal.     Pupils: Pupils are equal, round, and reactive to light.  Cardiovascular:     Rate and Rhythm: Normal rate and regular rhythm.     Pulses: Normal pulses.          Dorsalis pedis pulses are 2+ on the right side and 2+ on the left side.     Heart sounds: Normal heart sounds. No murmur heard.    No friction rub. No gallop.  Pulmonary:     Effort: Pulmonary effort is normal.     Breath sounds: Examination of the right-lower field reveals decreased breath sounds. Examination of the left-lower field reveals decreased breath sounds. Decreased breath sounds present.     Comments: Bibasilar lung  sounds are decreased, no noted rales or rhonchi. Abdominal:     General: Abdomen is flat. Bowel sounds are normal.     Palpations: Abdomen is soft.  Musculoskeletal:        General: Normal range of motion.     Cervical back: Normal range of motion and neck supple.     Right lower leg: 2+ Edema present.     Left lower leg: No edema.  Skin:    General: Skin is warm and dry.     Capillary Refill: Capillary refill takes less than 2 seconds.  Neurological:     General: No focal deficit present.     Mental  Status: He is alert. Mental status is at baseline.  Psychiatric:        Mood and Affect: Mood normal.     (all labs ordered are listed, but only abnormal results are displayed) Labs Reviewed  BASIC METABOLIC PANEL WITH GFR - Abnormal; Notable for the following components:      Result Value   Glucose, Bld 127 (*)    Creatinine, Ser 1.40 (*)    Calcium 8.8 (*)    GFR, Estimated 50 (*)    All other components within normal limits  CBC - Abnormal; Notable for the following components:   RBC 4.00 (*)    Hemoglobin 8.9 (*)    HCT 31.0 (*)    MCV 77.5 (*)    MCH 22.3 (*)    MCHC 28.7 (*)    RDW 21.5 (*)    All other components within normal limits  PROTIME-INR  URINALYSIS, ROUTINE W REFLEX MICROSCOPIC  PRO BRAIN NATRIURETIC PEPTIDE  TYPE AND SCREEN    EKG: None  Radiology: CT Angio Chest PE W/Cm &/Or Wo Cm Result Date: 07/14/2024 CLINICAL DATA:  Increased shortness of breath and hypoxia associated with tachypnea EXAM: CT ANGIOGRAPHY CHEST WITH CONTRAST TECHNIQUE: Multidetector CT imaging of the chest was performed using the standard protocol during bolus administration of intravenous contrast. Multiplanar CT image reconstructions and MIPs were obtained to evaluate the vascular anatomy. RADIATION DOSE REDUCTION: This exam was performed according to the departmental dose-optimization program which includes automated exposure control, adjustment of the mA and/or kV according to  patient size and/or use of iterative reconstruction technique. CONTRAST:  OMNIPAQUE  IOHEXOL  350 MG/ML SOLN COMPARISON:  CT chest dated 07/06/2024 FINDINGS: Cardiovascular: The study is adequate for the evaluation of pulmonary embolism the level of proximal segmental pulmonary arteries due to motion artifact and timing of contrast bolus. There are no filling defects in the central, lobar, or proximal segmental pulmonary artery branches to suggest acute pulmonary embolism. Main pulmonary artery measures 4.5 cm. Ascending thoracic aorta measures 4.8 x 4.7 cm. Mild multichamber cardiomegaly. No significant pericardial fluid/thickening. Coronary artery calcifications and aortic atherosclerosis. Mediastinum/Nodes: Imaged thyroid  gland without nodules meeting criteria for imaging follow-up by size. Normal esophagus. No substantial change in multi station lymphadenopathy, for example 14 mm right low paratracheal (7:48), 12 mm AP window (7:67), and 13 mm right hilar (7:59). Lungs/Pleura: The central airways are patent. Unchanged asymmetric elevation of the right hemidiaphragm. Increased bibasilar subsegmental atelectasis. New peripheral left lower lobe consolidation (9:81). Mild interlobular septal thickening with scattered peribronchovascular ground-glass opacities. No pneumothorax. No pleural effusion. Upper abdomen: Cholelithiasis. Musculoskeletal: No acute or abnormal lytic or blastic osseous lesions. Multilevel degenerative changes of the thoracic spine. Review of the MIP images confirms the above findings. IMPRESSION: 1. No evidence of acute pulmonary embolism to the level of the proximal segmental pulmonary arteries. 2. New peripheral left lower lobe consolidation, suspicious for pneumonia. 3. Mild interlobular septal thickening with scattered peribronchovascular ground-glass opacities, which may represent pulmonary edema. 4. Dilated main pulmonary artery measuring 4.5 cm can be seen in the setting of pulmonary  arterial hypertension. 5. Unchanged mediastinal and right hilar lymphadenopathy, likely reactive. 6. 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 Ascending thoracic aortic aneurysm. Recommend semi-annual imaging followup by CTA or MRA and referral to cardiothoracic surgery if not already obtained. This recommendation follows 2010 ACCF/AHA/AATS/ACR/ASA/SCA/SCAI/SIR/STS/SVM Guidelines for the Diagnosis and Management of Patients With Thoracic Aortic Disease. Circulation. 2010; 121: Z733-z630. Aortic aneurysm NOS (ICD10-I71.9) 7.  Aortic Atherosclerosis (ICD10-I70.0).  Electronically Signed   By: Limin  Xu M.D.   On: 07/14/2024 15:35   DG Chest Port 1 View Result Date: 07/14/2024 CLINICAL DATA:  Shortness of breath, hypoxia. EXAM: PORTABLE CHEST 1 VIEW COMPARISON:  May 31, 2024. FINDINGS: Stable cardiomediastinal silhouette. Hypoinflation of the lungs with minimal bibasilar subsegmental atelectasis. Bony thorax is unremarkable. IMPRESSION: Hypoinflation of the lungs with minimal bibasilar subsegmental atelectasis. Electronically Signed   By: Lynwood Landy Raddle M.D.   On: 07/14/2024 14:06     .Critical Care  Performed by: Myriam Dorn BROCKS, PA Authorized by: Myriam Dorn BROCKS, PA   Critical care provider statement:    Critical care time (minutes):  30   Critical care time was exclusive of:  Separately billable procedures and treating other patients   Critical care was necessary to treat or prevent imminent or life-threatening deterioration of the following conditions:  Respiratory failure   Critical care was time spent personally by me on the following activities:  Discussions with consultants, development of treatment plan with patient or surrogate, evaluation of patient's response to treatment, obtaining history from patient or surrogate, ordering and performing treatments and interventions, ordering and review of laboratory studies, ordering and review of radiographic studies, pulse oximetry and  re-evaluation of patient's condition   Care discussed with: admitting provider      Medications Ordered in the ED  cefTRIAXone  (ROCEPHIN ) 1 g in sodium chloride  0.9 % 100 mL IVPB (has no administration in time range)  azithromycin  (ZITHROMAX ) 500 mg in sodium chloride  0.9 % 250 mL IVPB (has no administration in time range)  iohexol  (OMNIPAQUE ) 350 MG/ML injection 100 mL (100 mLs Intravenous Contrast Given 07/14/24 1437)    Clinical Course as of 07/14/24 1555  Thu Jul 14, 2024  1550 CT Angio Chest PE W/Cm &/Or Wo Cm CT did not demonstrate a acute PE however did show a new peripheral left lower lobe consolidation that was suspicious for pneumonia.  Also showed peribronchial vesicular groundglass opacities. [JG]    Clinical Course User Index [JG] Myriam Dorn BROCKS, PA                                 Medical Decision Making Amount and/or Complexity of Data Reviewed Labs: ordered. Radiology: ordered. Decision-making details documented in ED Course.  Risk Prescription drug management. Decision regarding hospitalization.   Medical Decision Making:   KARLON SCHLAFER is a 83 y.o. male who presented to the ED today with hypoxia detailed above.    Additional history discussed with patient's family/caregivers.  External chart has been reviewed including previous labs and imaging as well as previous admission records. Patient's presentation is complicated by their history of previous PE as well as combined systolic heart failure.  Patient placed on continuous vitals and telemetry monitoring while in ED which was reviewed periodically.  Complete initial physical exam performed, notably the patient  was alert and oriented in no apparent distress.  Physical exam was noted with decreased bibasilar lung sounds.    Reviewed and confirmed nursing documentation for past medical history, family history, social history.    Initial Assessment:   With the patient's presentation of hypoxia, most likely  diagnosis is hypoxic respiratory failure.  Levert diagnosis does include acute on chronic CHF, pulmonary embolus.  Initial Plan:  Obtain PT/INR secondary to recent discontinuation of anticoagulation. Screening labs including CBC and Metabolic panel to evaluate for infectious or metabolic etiology of  disease.  Urinalysis with reflex culture ordered to evaluate for UTI or relevant urologic/nephrologic pathology.  CXR to evaluate for structural/infectious intrathoracic pathology.  Obtain CT angio of the chest to rule out acute PE. EKG/Troponin testing/BNP testing to evaluate for cardiac pathology. Objective evaluation as below reviewed   Initial Study Results:   Laboratory  All laboratory results reviewed without evidence of clinically relevant pathology.   Exceptions include: Creatinine is 1.40 which is baseline for this patient.  Calcium is 8.8 with is also baseline for this patient.  CBC is notable for decreased hemoglobin 8.9 and RBCs at 4 which again is baseline for this patient.  EKG EKG was reviewed independently. Rate, rhythm, axis, intervals all examined and without medically relevant abnormality. ST segments without concerns for elevations.    Radiology:  All images reviewed independently. Agree with radiology report at this time.   CT Angio Chest PE W/Cm &/Or Wo Cm Result Date: 07/14/2024 CLINICAL DATA:  Increased shortness of breath and hypoxia associated with tachypnea EXAM: CT ANGIOGRAPHY CHEST WITH CONTRAST TECHNIQUE: Multidetector CT imaging of the chest was performed using the standard protocol during bolus administration of intravenous contrast. Multiplanar CT image reconstructions and MIPs were obtained to evaluate the vascular anatomy. RADIATION DOSE REDUCTION: This exam was performed according to the departmental dose-optimization program which includes automated exposure control, adjustment of the mA and/or kV according to patient size and/or use of iterative reconstruction  technique. CONTRAST:  OMNIPAQUE  IOHEXOL  350 MG/ML SOLN COMPARISON:  CT chest dated 07/06/2024 FINDINGS: Cardiovascular: The study is adequate for the evaluation of pulmonary embolism the level of proximal segmental pulmonary arteries due to motion artifact and timing of contrast bolus. There are no filling defects in the central, lobar, or proximal segmental pulmonary artery branches to suggest acute pulmonary embolism. Main pulmonary artery measures 4.5 cm. Ascending thoracic aorta measures 4.8 x 4.7 cm. Mild multichamber cardiomegaly. No significant pericardial fluid/thickening. Coronary artery calcifications and aortic atherosclerosis. Mediastinum/Nodes: Imaged thyroid  gland without nodules meeting criteria for imaging follow-up by size. Normal esophagus. No substantial change in multi station lymphadenopathy, for example 14 mm right low paratracheal (7:48), 12 mm AP window (7:67), and 13 mm right hilar (7:59). Lungs/Pleura: The central airways are patent. Unchanged asymmetric elevation of the right hemidiaphragm. Increased bibasilar subsegmental atelectasis. New peripheral left lower lobe consolidation (9:81). Mild interlobular septal thickening with scattered peribronchovascular ground-glass opacities. No pneumothorax. No pleural effusion. Upper abdomen: Cholelithiasis. Musculoskeletal: No acute or abnormal lytic or blastic osseous lesions. Multilevel degenerative changes of the thoracic spine. Review of the MIP images confirms the above findings. IMPRESSION: 1. No evidence of acute pulmonary embolism to the level of the proximal segmental pulmonary arteries. 2. New peripheral left lower lobe consolidation, suspicious for pneumonia. 3. Mild interlobular septal thickening with scattered peribronchovascular ground-glass opacities, which may represent pulmonary edema. 4. Dilated main pulmonary artery measuring 4.5 cm can be seen in the setting of pulmonary arterial hypertension. 5. Unchanged mediastinal and  right hilar lymphadenopathy, likely reactive. 6. 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 Ascending thoracic aortic aneurysm. Recommend semi-annual imaging followup by CTA or MRA and referral to cardiothoracic surgery if not already obtained. This recommendation follows 2010 ACCF/AHA/AATS/ACR/ASA/SCA/SCAI/SIR/STS/SVM Guidelines for the Diagnosis and Management of Patients With Thoracic Aortic Disease. Circulation. 2010; 121: [id number]. Aortic aneurysm NOS (ICD10-I71.9) 7.  Aortic Atherosclerosis (ICD10-I70.0). Electronically Signed   By: Limin  Xu M.D.   On: 07/14/2024 15:35   DG Chest Port 1 View Result Date: 07/14/2024 CLINICAL DATA:  Shortness of breath, hypoxia. EXAM: PORTABLE CHEST 1 VIEW COMPARISON:  May 31, 2024. FINDINGS: Stable cardiomediastinal silhouette. Hypoinflation of the lungs with minimal bibasilar subsegmental atelectasis. Bony thorax is unremarkable. IMPRESSION: Hypoinflation of the lungs with minimal bibasilar subsegmental atelectasis. Electronically Signed   By: Lynwood Landy Raddle M.D.   On: 07/14/2024 14:06   CT CHEST ABDOMEN PELVIS W CONTRAST Result Date: 07/06/2024 CLINICAL DATA:  New diagnosis of stomach cancer EXAM: CT CHEST, ABDOMEN, AND PELVIS WITH CONTRAST TECHNIQUE: Multidetector CT imaging of the chest, abdomen and pelvis was performed following the standard protocol during bolus administration of intravenous contrast. RADIATION DOSE REDUCTION: This exam was performed according to the departmental dose-optimization program which includes automated exposure control, adjustment of the mA and/or kV according to patient size and/or use of iterative reconstruction technique. CONTRAST:  80mL OMNIPAQUE  IOHEXOL  300 MG/ML  SOLN COMPARISON:  Chest CT March 03, 2024 FINDINGS: CT CHEST FINDINGS Cardiovascular: The heart size is borderline normal. Enlarged pulmonary artery measuring 44 mm. Aneurysmal dilation of ascending aorta measures 4.9 cm. No pericardial fluid. Mediastinum/Nodes:  Multiple prominent subcentimeter and pericentimeter mediastinal lymph nodes. Index node in right lower paratracheal measures 1.4 cm. Lungs/Pleura: Right upper lobe pulmonary nodule measuring 5 mm (7/34), new to prior. No emphysematous changes, air space consolidation or honeycombing. No pleural effusion. Musculoskeletal: Unremarkable CT ABDOMEN PELVIS FINDINGS Hepatobiliary: Hepatic steatosis. Cholelithiasis without significant biliary dilatation. Normal gallbladder wall thickness. Pancreas: Unremarkable. No pancreatic ductal dilatation or surrounding inflammatory changes. Spleen: Normal in size without focal abnormality. Adrenals/Urinary Tract: Right adrenal small nodule measuring 8 mm. Left adrenal is normal. Bilateral simple renal cortical cysts which does not require imaging follow-up. Kidneys are otherwise normal, without renal calculi, or hydronephrosis. Bladder is unremarkable. Stomach/Bowel: There is an enhancing segment wall thickening/mass along the lesser curvature of the stomach measuring about 7.3 x 3.2 cm consistent with known malignancy. Bowel loops are unremarkable Vascular/Lymphatic: Mesenteric root fat stranding with multiple para-aortic aortocaval and pericaval prominent subcentimeter and pericentimeter lymph nodes. Celiac lymph node measuring 1.4 cm (2/56). Additional subcentimeter multiple mesenteric and celiac node lymph nodes index node in aortocaval measures 9 mm (2/79 (2/79, 73 Reproductive: Nodular heterogeneous enlarged prostate. Other: Bilateral fat containing inguinal hernias. Small fat containing umbilical hernia. Musculoskeletal: Fat containing intramuscular lipoma along the left iliacus muscle measuring 2.7 cm. No suspicious osseous lesion. IMPRESSION: Enhancing lesser curvature wall thickening/mass likely correlating to known gastric malignancy. Mesenteric root fat stranding and multiple subcentimeter and pericentimeter retroperitoneal/para-aortic and celiac lymph nodes concerning  for metastatic nodal disease. New upper lobe pulmonary nodule, indeterminate. Metastasis cannot be excluded. Follow-up according to oncology protocols as Fleischner guidelines cannot be utilized and a it is in this patient with known malignancy. Stable ascending aorta aneurysm. Enlarged pulmonary artery which can be seen the setting of pulmonary artery hypertension. Cholelithiasis. Right adrenal small nodule, indeterminate. Metastasis cannot be excluded. Electronically Signed   By: Megan  Zare M.D.   On: 07/06/2024 13:20      Consults: Case discussed with CHARM Apo, MD w/ the hospitalist team.   Reassessment and Plan:   Based on the increased oxygen  needs of this patient, along with their history of hypoxia and dyspnea, this patient will require admission for continued IV antibiotics secondary to a new onset of pneumonia as noted in the CT scan.  CT did demonstrate a left-sided lobar pneumonia.  Initiated IV ceftriaxone  and azithromycin  for community-acquired pneumonia.  Plan is at this time to consult with hospitalist team for admission secondary for  continued IV antibiotics and monitoring of his oxygenation status.  This is discussed with the patient, he understands and agrees with the need for admission, has no further concerns at this time.  Discussed the case with hospitalist team as noted above, they agreed to accept this patient for admission for continued IV antibiotics and monitoring of his oxygenation status.       Final diagnoses:  Community acquired pneumonia, unspecified laterality    ED Discharge Orders     None          Myriam Dorn BROCKS, [place] 07/14/24 1653    Armenta Canning, MD 07/20/24 [phone]

## 2024-07-15 DIAGNOSIS — J9601 Acute respiratory failure with hypoxia: Secondary | ICD-10-CM | POA: Diagnosis not present

## 2024-07-15 DIAGNOSIS — C169 Malignant neoplasm of stomach, unspecified: Secondary | ICD-10-CM | POA: Diagnosis not present

## 2024-07-15 DIAGNOSIS — J9602 Acute respiratory failure with hypercapnia: Secondary | ICD-10-CM

## 2024-07-15 DIAGNOSIS — I5032 Chronic diastolic (congestive) heart failure: Secondary | ICD-10-CM

## 2024-07-15 DIAGNOSIS — J181 Lobar pneumonia, unspecified organism: Secondary | ICD-10-CM | POA: Diagnosis not present

## 2024-07-15 LAB — RESPIRATORY PANEL BY PCR

## 2024-07-15 LAB — CBC
HCT: 32.5 % — ABNORMAL LOW (ref 39.0–52.0)
Hemoglobin: 8.9 g/dL — ABNORMAL LOW (ref 13.0–17.0)
MCH: 21.6 pg — ABNORMAL LOW (ref 26.0–34.0)
MCHC: 27.4 g/dL — ABNORMAL LOW (ref 30.0–36.0)
MCV: 78.9 fL — ABNORMAL LOW (ref 80.0–100.0)
Platelets: 229 K/uL (ref 150–400)
RBC: 4.12 MIL/uL — ABNORMAL LOW (ref 4.22–5.81)
RDW: 21.4 % — ABNORMAL HIGH (ref 11.5–15.5)
WBC: 9.1 K/uL (ref 4.0–10.5)
nRBC: 0 % (ref 0.0–0.2)

## 2024-07-15 LAB — BASIC METABOLIC PANEL WITH GFR
Anion gap: 9 (ref 5–15)
BUN: 11 mg/dL (ref 8–23)
CO2: 25 mmol/L (ref 22–32)
Calcium: 8.3 mg/dL — ABNORMAL LOW (ref 8.9–10.3)
Chloride: 102 mmol/L (ref 98–111)
Creatinine, Ser: 1.17 mg/dL (ref 0.61–1.24)
GFR, Estimated: >60 mL/min (ref >60)
Glucose, Bld: 90 mg/dL (ref 70–99)
Potassium: 3.8 mmol/L (ref 3.5–5.1)
Sodium: 136 mmol/L (ref 135–145)

## 2024-07-15 LAB — GLUCOSE, CAPILLARY
Glucose-Capillary: 83 mg/dL (ref 70–99)
Glucose-Capillary: 115 mg/dL — ABNORMAL HIGH (ref 70–99)
Glucose-Capillary: 134 mg/dL — ABNORMAL HIGH (ref 70–99)
Glucose-Capillary: 137 mg/dL — ABNORMAL HIGH (ref 70–99)

## 2024-07-15 LAB — EXPECTORATED SPUTUM ASSESSMENT W GRAM STAIN, RFLX TO RESP C

## 2024-07-15 LAB — STREP PNEUMONIAE URINARY ANTIGEN: Strep Pneumo Urinary Antigen: NEGATIVE

## 2024-07-15 LAB — PROCALCITONIN: Procalcitonin: <0.1 ng/mL

## 2024-07-15 MED ORDER — CHLORHEXIDINE GLUCONATE CLOTH 2 % EX PADS
6.0000 | MEDICATED_PAD | Freq: Every day | CUTANEOUS | Status: DC
  Administered 2024-07-16 – 2024-07-26 (×12): 6 via TOPICAL

## 2024-07-15 MED ORDER — HYDROCODONE-ACETAMINOPHEN 5-325 MG PO TABS: 1.0000 | ORAL_TABLET | Freq: Two times a day (BID) | ORAL | Status: DC | PRN

## 2024-07-15 NOTE — Progress Notes (Signed)
 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  Admitted for acute hypoxic and hypercapnic respiratory failure secondary to left lower lobe pneumonia, requiring BiPAP.  Consultants None  Procedures/Events 8/14 admit for acute respiratory failure, on BiPAP  Assessment and Plan: Acute hypoxic and hypercapnic respiratory failure due to community-acquired pneumonia of left lower lobe Asthma Oxygenation reportedly in the 70s at home, high 80s on nonrebreather in stepdown.  Placed on BiPAP.   CT showed left lower lobe pneumonia.  No PE.  COVID, influenza, RSV negative. Off BiPAP this morning on 20 L high flow.  Oxygenation marginal in the high 80s, low 90s. Will continue empiric antibiotics, BiPAP as needed, high flow oxygen .  Wean as tolerated. Asthma appears stable. Continue Breo Ellipta  and albuterol  as needed.   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  Scheduled for PET/CT 8/19.  Hemoglobin stable.  Continue iron supplement on discharge.   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  Recently Lasix  was changed from daily to as needed dosing and spironolactone  cut in half due to low blood pressures.  Appears to have some increasing peripheral edema on admission.  BP is elevated on admission. Restarted on low-dose Lasix  20 mg daily, continued on spironolactone  25 mg daily, Coreg  25 mg twice daily, losartan  100 mg daily, BiDil  20-37.5 mg 3 times daily   PMH pulmonary embolism  Now off Eliquis  due to GI bleeding.   Type 2 diabetes Currently diet controlled.  Patient states he has been taken off all of his  diabetes meds by his PCP due to improved glycemic control with occasional hypoglycemia.  Reports last A1c was <6.0%. Stable. SSI   CKD stage IIIa  Renal function stable.     Hypertension  Continue losartan , Coreg , spironolactone , BiDil .   Hyperlipidemia  Continue simvastatin .   OSA  Continue CPAP nightly when off high flow   Ascending thoracic aortic aneurysm  Seen again on CT, measuring 4.8 cm.  Follows with CT surgery.   Chronic pain  Continue home Norco with hold parameters.      Subjective:  Feels better today, breathing much better Hungry, would like to eat  Physical Exam: Vitals:   07/15/24 0800 07/15/24 0815 07/15/24 0911 07/15/24 1002  BP: (!) 168/75 (!) 168/75 (!) 146/65   Pulse: 68 70 70   Resp: 20 (!) 24 19   Temp: (!) 100.8 F (38.2 C)     TempSrc: Oral     SpO2: 95% 94% 92% 92%  Weight:      Height:       Physical Exam Vitals reviewed.  Constitutional:      General: He is not in acute distress.    Appearance: He is not ill-appearing or toxic-appearing.  Cardiovascular:     Rate and Rhythm: Normal rate and regular rhythm.     Heart sounds: No murmur heard. Pulmonary:     Effort: No respiratory distress.     Breath sounds: No wheezing, rhonchi or rales.     Comments: Mild increased respiratory effort. On high flow 20LPM Neurological:     Mental Status: He is alert.  Psychiatric:  Mood and Affect: Mood normal.        Behavior: Behavior normal.     Data Reviewed: BMP unremarkable Hemoglobin stable 8.9 WBC within normal limits  Family Communication: daughter at bedside  Disposition: Status is: Inpatient Remains inpatient appropriate because: lobar pneumonia     Time spent: 35 minutes  Author: Toribio Door, MD 07/15/2024 10:13 AM  For on call review [email or web address].

## 2024-07-15 NOTE — Plan of Care (Signed)

## 2024-07-15 NOTE — Progress Notes (Signed)
 Patient taken off Bipap and placed on Heated High Flow Emerald 30L, 83%, sats 92%. Patient feels comfortable and has no increased WOB at this time. RN notified, MD at bedside.

## 2024-07-15 NOTE — Plan of Care (Signed)

## 2024-07-16 DIAGNOSIS — J9602 Acute respiratory failure with hypercapnia: Secondary | ICD-10-CM | POA: Diagnosis not present

## 2024-07-16 DIAGNOSIS — J181 Lobar pneumonia, unspecified organism: Secondary | ICD-10-CM | POA: Diagnosis not present

## 2024-07-16 DIAGNOSIS — E119 Type 2 diabetes mellitus without complications: Secondary | ICD-10-CM

## 2024-07-16 DIAGNOSIS — I5032 Chronic diastolic (congestive) heart failure: Secondary | ICD-10-CM | POA: Diagnosis not present

## 2024-07-16 DIAGNOSIS — J9601 Acute respiratory failure with hypoxia: Secondary | ICD-10-CM | POA: Diagnosis not present

## 2024-07-16 LAB — BASIC METABOLIC PANEL WITH GFR
Anion gap: 12 (ref 5–15)
BUN: 12 mg/dL (ref 8–23)
CO2: 26 mmol/L (ref 22–32)
Calcium: 8.2 mg/dL — ABNORMAL LOW (ref 8.9–10.3)
Chloride: 100 mmol/L (ref 98–111)
Creatinine, Ser: 1.44 mg/dL — ABNORMAL HIGH (ref 0.61–1.24)
GFR, Estimated: 48 mL/min — ABNORMAL LOW (ref >60)
Glucose, Bld: 112 mg/dL — ABNORMAL HIGH (ref 70–99)
Potassium: 4.1 mmol/L (ref 3.5–5.1)
Sodium: 138 mmol/L (ref 135–145)

## 2024-07-16 LAB — CBC
HCT: 31.2 % — ABNORMAL LOW (ref 39.0–52.0)
Hemoglobin: 8.5 g/dL — ABNORMAL LOW (ref 13.0–17.0)
MCH: 22.1 pg — ABNORMAL LOW (ref 26.0–34.0)
MCHC: 27.2 g/dL — ABNORMAL LOW (ref 30.0–36.0)
MCV: 81 fL (ref 80.0–100.0)
Platelets: 236 K/uL (ref 150–400)
RBC: 3.85 MIL/uL — ABNORMAL LOW (ref 4.22–5.81)
RDW: 22 % — ABNORMAL HIGH (ref 11.5–15.5)
WBC: 8.7 K/uL (ref 4.0–10.5)
nRBC: 0 % (ref 0.0–0.2)

## 2024-07-16 LAB — GLUCOSE, CAPILLARY
Glucose-Capillary: 119 mg/dL — ABNORMAL HIGH (ref 70–99)
Glucose-Capillary: 133 mg/dL — ABNORMAL HIGH (ref 70–99)
Glucose-Capillary: 134 mg/dL — ABNORMAL HIGH (ref 70–99)
Glucose-Capillary: 146 mg/dL — ABNORMAL HIGH (ref 70–99)

## 2024-07-16 MED ORDER — ORAL CARE MOUTH RINSE: 15.0000 mL | OROMUCOSAL | Status: DC | PRN

## 2024-07-16 NOTE — Plan of Care (Signed)

## 2024-07-16 NOTE — TOC Initial Note (Signed)
 Transition of Care [facility]) - Initial/Assessment Note    Patient Details  Name: Bradley Alvarez MRN: [id number] Date of Birth: 10/08/1941  [facility] of [facility]) CM/SW Contact:    Sheri ONEIDA Sharps, LCSW Phone Number: 07/16/2024, 9:19 AM  Clinical Narrative:                 Pt from home with spouse. Pt currently on 30L HHFNC. Pt continues medical workup. TOC following for dc needs.    Barriers to Discharge: Continued Medical Work up   Patient Goals and CMS Choice Patient states their goals for this hospitalization and ongoing recovery are:: return home   Choice offered to / list presented to : NA      Expected Discharge Plan and Services In-house Referral: NA Discharge Planning Services: NA   Living arrangements for the past 2 months: Single Family Home                 DME Arranged: N/A DME Agency: NA       HH Arranged: NA HH Agency: NA        Prior Living Arrangements/Services Living arrangements for the past 2 months: Single Family Home Lives with:: Spouse Patient language and need for interpreter reviewed:: Yes Do you feel safe going back to the place where you live?: Yes      Need for Family Participation in Patient Care: Yes (Comment) Care giver support system in place?: Yes (comment)   Criminal Activity/Legal Involvement Pertinent to Current Situation/Hospitalization: No - Comment as needed  Activities of Daily Living   ADL Screening (condition at time of admission) Independently performs ADLs?: Yes (appropriate for developmental age) Is the patient deaf or have difficulty hearing?: No Does the patient have difficulty seeing, even when wearing glasses/contacts?: No Does the patient have difficulty concentrating, remembering, or making decisions?: No  Permission Sought/Granted                  Emotional Assessment Appearance:: Appears stated age Attitude/Demeanor/Rapport: Engaged Affect (typically observed): Accepting Orientation: : Oriented to  Self, Oriented to Situation, Oriented to  Time, Oriented to Place Alcohol  / Substance Use: Not Applicable    Admission diagnosis:  CAP (community acquired pneumonia) [J18.9] Community acquired pneumonia, unspecified laterality [J18.9] Patient Active Problem List   Diagnosis Date Noted   Lobar pneumonia (HCC) 07/15/2024   Gastric adenocarcinoma (HCC) 07/15/2024   Community acquired pneumonia of left lower lobe of lung 07/14/2024   Chronic heart failure with preserved ejection fraction (HFpEF) (HCC) 07/14/2024   History of pulmonary embolism 07/14/2024   Asthma 07/14/2024   Precordial pain 06/06/2024   Blood clotting disorder (HCC) 08/19/2021   Acute respiratory failure with hypoxia and hypercapnia (HCC) 07/31/2021   Pulmonary embolism (HCC) 07/13/2021   Coronary artery disease involving native coronary artery of native heart without angina pectoris 05/02/2021   Thoracic aortic aneurysm without rupture (HCC) 05/03/2020   Exudative age-related macular degeneration of left eye with active choroidal neovascularization (HCC) 03/28/2020   Retinal hemorrhage of left eye 03/28/2020   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   Pseudophakia 03/28/2020   Right epiretinal membrane 03/28/2020   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 03/28/2020   Abdominal aortic aneurysm (AAA) 30 to 34 mm in diameter (HCC) 09/24/2019   Laboratory examination 03/07/2019   Dilated aortic root (HCC) 02/04/2019   Enlargement of abdominal aorta (HCC) 02/04/2019   Acute kidney injury (HCC) 05/22/2016   Hyponatremia 05/22/2016   Hyperlipidemia LDL  goal <70 12/27/2015   OSA (obstructive sleep apnea) 04/26/2015   Edema, lower extremity 06/13/2013   Retention, urine 12/23/2012   Hypokalemia 12/22/2012   Postoperative anemia due to acute blood loss 12/22/2012   Skin bulla 12/22/2012   Essential hypertension    Type 2 diabetes mellitus (HCC)    Hypercholesteremia    Lichen  planus    Left knee DJD    Morbid obesity with BMI of 45.0-49.9, adult (HCC)    Sleep apnea, obstructive    Benign prostate hyperplasia    Diabetes mellitus (HCC) 05/29/2011   PCP:  [name], MD Pharmacy:   [facility] [address] [place] - [address] [place] [address] Phone: [phone] Fax: [phone]     Social Drivers of Health (SDOH) Social History: SDOH Screenings   Food Insecurity: No Food Insecurity (07/14/2024)  Housing: Low Risk  (07/14/2024)  Transportation Needs: No Transportation Needs (07/14/2024)  Utilities: Not At Risk (07/14/2024)  Alcohol  Screen: Low Risk  (07/12/2024)  Depression (PHQ2-9): Low Risk  (07/12/2024)  Financial Resource Strain: Low Risk  (07/12/2024)  Physical Activity: Inactive (07/12/2024)  Social Connections: Socially Integrated (07/14/2024)  Stress: No Stress Concern Present (07/12/2024)  Tobacco Use: Medium Risk (07/14/2024)  Health Literacy: Adequate Health Literacy (07/12/2024)   SDOH Interventions:     Readmission Risk Interventions    07/16/2024    9:18 AM  Readmission Risk Prevention Plan  Transportation Screening Complete  PCP or Specialist Appt within 3-5 Days Complete  HRI or Home Care Consult Complete  Social Work Consult for Recovery Care Planning/Counseling Complete  Palliative Care Screening Not Applicable  Medication Review Oceanographer) Complete

## 2024-07-16 NOTE — Progress Notes (Signed)
   07/16/24 1959  BiPAP/CPAP/SIPAP  BiPAP/CPAP/SIPAP Pt Type Adult  Reason BIPAP/CPAP not in use Other(comment) (remains on Heated High FLow)

## 2024-07-16 NOTE — Progress Notes (Signed)
 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  Admitted for acute hypoxic and hypercapnic respiratory failure secondary to left lower lobe pneumonia, requiring BiPAP.  Consultants None  Procedures/Events 8/14 admit for acute respiratory failure, on BiPAP   Assessment and Plan: Acute hypoxic and hypercapnic respiratory failure due to community-acquired pneumonia of left lower lobe Asthma Oxygenation reportedly in the 70s at home, high 80s on nonrebreather in stepdown.  Placed on BiPAP.   CT showed left lower lobe pneumonia.  No PE.  COVID, influenza, RSV negative. Off BiPAP since 8/15 AM. Still on 20 L high flow.  Oxygenation marginal low 90s. Will continue empiric antibiotics, BiPAP as needed, high flow oxygen .  Wean as tolerated. Asthma appears stable. Continue Breo Ellipta  and albuterol  as needed.   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  Scheduled for PET/CT 8/19.  Hemoglobin stable.  Continue iron supplement on discharge.   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  Recently Lasix  was changed from daily to as needed dosing and spironolactone  cut in half due to low blood pressures.  Appears to have some increasing peripheral edema on admission.  BP is elevated on admission. Restarted on low-dose Lasix  20 mg daily, continued on spironolactone  25 mg daily, Coreg  25 mg twice daily, losartan  100 mg daily, BiDil  20-37.5 mg 3 times daily   PMH pulmonary embolism  Now off Eliquis  due to GI bleeding.   Type 2 diabetes Currently diet controlled.  Patient states he has been taken off all of his  diabetes meds by his PCP due to improved glycemic control with occasional hypoglycemia.  Reports last A1c was <6.0%. Stable. SSI   CKD stage IIIa  Renal function stable.   Stop spironolactone , Lasix , losartan     Hypertension  Continue losartan , Coreg , spironolactone , BiDil .   Hyperlipidemia  Continue simvastatin .   OSA  Continue CPAP nightly when off high flow   Ascending thoracic aortic aneurysm  Seen again on CT, measuring 4.8 cm.  Follows with CT surgery.   Chronic pain  Continue home Norco with hold parameters.    Subjective:  Feels good Eating fine Slept well on high flow  Physical Exam: Vitals:   07/16/24 0419 07/16/24 0421 07/16/24 0643 07/16/24 0800  BP:    (!) 156/72  Pulse:    71  Resp:    (!) 21  Temp: 99 F (37.2 C)  100 F (37.8 C)   TempSrc: Oral  Axillary   SpO2:    94%  Weight:  (!) 143.2 kg    Height:       Physical Exam Vitals reviewed.  Constitutional:      General: He is not in acute distress.    Appearance: He is not ill-appearing or toxic-appearing.  Cardiovascular:     Rate and Rhythm: Normal rate and regular rhythm.     Heart sounds: No murmur heard. Pulmonary:     Effort: Pulmonary effort is normal. No respiratory distress.     Breath sounds: No wheezing, rhonchi or rales.  Neurological:     Mental Status: He is alert.  Psychiatric:        Mood and Affect: Mood normal.  Behavior: Behavior normal.     Data Reviewed: CBG stable Creatinine 1.44 probably at baseline even though yesterday was lower Hgb stable 8.5  Family Communication: son at bedside   Disposition: Status is: Inpatient Remains inpatient appropriate because: hypoxia, pneumonia     Time spent: 35 minutes  Author: Toribio Door, MD 07/16/2024 8:57 AM  For on call review [email or web address].

## 2024-07-17 DIAGNOSIS — C169 Malignant neoplasm of stomach, unspecified: Secondary | ICD-10-CM | POA: Diagnosis not present

## 2024-07-17 DIAGNOSIS — J9601 Acute respiratory failure with hypoxia: Secondary | ICD-10-CM | POA: Diagnosis not present

## 2024-07-17 DIAGNOSIS — J181 Lobar pneumonia, unspecified organism: Secondary | ICD-10-CM | POA: Diagnosis not present

## 2024-07-17 DIAGNOSIS — E119 Type 2 diabetes mellitus without complications: Secondary | ICD-10-CM | POA: Diagnosis not present

## 2024-07-17 LAB — HEMOGLOBIN A1C
Hgb A1c MFr Bld: 5.9 % — ABNORMAL HIGH (ref 4.8–5.6)
Mean Plasma Glucose: 123 mg/dL

## 2024-07-17 LAB — GLUCOSE, CAPILLARY
Glucose-Capillary: 111 mg/dL — ABNORMAL HIGH (ref 70–99)
Glucose-Capillary: 141 mg/dL — ABNORMAL HIGH (ref 70–99)
Glucose-Capillary: 127 mg/dL — ABNORMAL HIGH (ref 70–99)
Glucose-Capillary: 119 mg/dL — ABNORMAL HIGH (ref 70–99)

## 2024-07-17 LAB — BASIC METABOLIC PANEL WITH GFR
Anion gap: 6 (ref 5–15)
BUN: 11 mg/dL (ref 8–23)
CO2: 28 mmol/L (ref 22–32)
Calcium: 8.2 mg/dL — ABNORMAL LOW (ref 8.9–10.3)
Chloride: 100 mmol/L (ref 98–111)
Creatinine, Ser: 1.25 mg/dL — ABNORMAL HIGH (ref 0.61–1.24)
GFR, Estimated: 57 mL/min — ABNORMAL LOW (ref >60)
Glucose, Bld: 119 mg/dL — ABNORMAL HIGH (ref 70–99)
Potassium: 4.1 mmol/L (ref 3.5–5.1)
Sodium: 134 mmol/L — ABNORMAL LOW (ref 135–145)

## 2024-07-17 LAB — CBC
HCT: 30 % — ABNORMAL LOW (ref 39.0–52.0)
Hemoglobin: 8.2 g/dL — ABNORMAL LOW (ref 13.0–17.0)
MCH: 21.7 pg — ABNORMAL LOW (ref 26.0–34.0)
MCHC: 27.3 g/dL — ABNORMAL LOW (ref 30.0–36.0)
MCV: 79.4 fL — ABNORMAL LOW (ref 80.0–100.0)
Platelets: 222 K/uL (ref 150–400)
RBC: 3.78 MIL/uL — ABNORMAL LOW (ref 4.22–5.81)
RDW: 21.1 % — ABNORMAL HIGH (ref 11.5–15.5)
WBC: 8.7 K/uL (ref 4.0–10.5)
nRBC: 0 % (ref 0.0–0.2)

## 2024-07-17 LAB — LEGIONELLA PNEUMOPHILA SEROGP 1 UR AG: L. pneumophila Serogp 1 Ur Ag: NEGATIVE

## 2024-07-17 MED ORDER — LIDOCAINE 5 % EX PTCH
1.0000 | MEDICATED_PATCH | CUTANEOUS | Status: DC
  Administered 2024-07-17 – 2024-07-27 (×10): 1 via TRANSDERMAL
  Filled 2024-07-17 (×10): qty 1

## 2024-07-17 NOTE — Progress Notes (Signed)
 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  Admitted for acute hypoxic and hypercapnic respiratory failure secondary to left lower lobe pneumonia, requiring BiPAP.  Consultants None  Procedures/Events 8/14 admit for acute respiratory failure, on BiPAP  Assessment and Plan: Acute hypoxic and hypercapnic respiratory failure due to community-acquired pneumonia of left lower lobe Asthma Oxygenation reportedly in the 70s at home, high 80s on nonrebreather in stepdown.  Placed on BiPAP.   CT showed left lower lobe pneumonia.  No PE.  COVID, influenza, RSV negative. Off BiPAP since 8/15 AM. Now on 15 L high flow.  Oxygenation marginal low 90s. Will continue empiric antibiotics, BiPAP as needed, high flow oxygen .  Wean as tolerated. Asthma appears stable. Continue Breo Ellipta  and albuterol  as needed.   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  Scheduled for PET/CT 8/19.  Hemoglobin stable.  Continue iron supplement on discharge.   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  Recently Lasix  was changed from daily to as needed dosing and spironolactone  cut in half due to low blood pressures.  Appears to have some increasing peripheral edema on admission.  BP is elevated on admission. Restarted on low-dose Lasix  20 mg daily, continued on spironolactone  25 mg daily, Coreg  25 mg twice daily, losartan  100 mg daily, BiDil  20-37.5 mg 3 times daily   PMH pulmonary embolism  Now off Eliquis  due to GI bleeding.   Type 2 diabetes Currently diet controlled.  Patient states he has been taken off all of his diabetes  meds by his PCP due to improved glycemic control with occasional hypoglycemia.  Reports last A1c was <6.0%. Stable. SSI   CKD stage IIIa  Renal function stable.   Stop spironolactone , Lasix , losartan     Hypertension  Continue losartan , Coreg , spironolactone , BiDil .   Hyperlipidemia  Continue simvastatin .   OSA  Continue CPAP nightly when off high flow   Ascending thoracic aortic aneurysm  Seen again on CT, measuring 4.8 cm.  Follows with CT surgery.   Chronic pain  Continue home Norco with hold parameters.      Subjective:  Feels good  Physical Exam: Vitals:   07/17/24 0600 07/17/24 0700 07/17/24 0800 07/17/24 0825  BP: (!) 154/79 (!) 177/80 (!) 191/81   Pulse: 70 69 72 70  Resp: (!) 24 (!) 28 (!) 28 16  Temp:      TempSrc:      SpO2: 97% 96% 92% 93%  Weight:      Height:       Physical Exam Vitals reviewed.  Constitutional:      General: He is not in acute distress.    Appearance: He is not ill-appearing or toxic-appearing.  Cardiovascular:     Rate and Rhythm: Normal rate and regular rhythm.     Heart sounds: No murmur heard.    Comments: Telemetry SR Pulmonary:     Effort: Pulmonary effort is normal. No respiratory distress.     Breath sounds: No wheezing, rhonchi or rales.  Neurological:     Mental Status: He is alert.  Psychiatric:        Mood and Affect: Mood normal.  Behavior: Behavior normal.     Data Reviewed: CBG stable Creatinine down to 1.25 Hgb stable 8.2  Family Communication: wife at bedside  Disposition: Status is: Inpatient Remains inpatient appropriate because: hypoxia     Time spent: 35 minutes  Author: Toribio Door, MD 07/17/2024 9:16 AM  For on call review [email or web address].

## 2024-07-17 NOTE — Plan of Care (Signed)
  Problem: Health Behavior/Discharge Planning: Goal: Ability to manage health-related needs will improve Outcome: Progressing   Problem: Clinical Measurements: Goal: Will remain free from infection Outcome: Progressing Goal: Diagnostic test results will improve Outcome: Progressing Goal: Respiratory complications will improve Outcome: Progressing Goal: Cardiovascular complication will be avoided Outcome: Progressing   Problem: Nutrition: Goal: Adequate nutrition will be maintained Outcome: Progressing   Problem: Elimination: Goal: Will not experience complications related to urinary retention Outcome: Progressing

## 2024-07-17 NOTE — Plan of Care (Signed)

## 2024-07-18 ENCOUNTER — Telehealth: Payer: Self-pay | Admitting: Nurse Practitioner

## 2024-07-18 ENCOUNTER — Encounter (HOSPITAL_COMMUNITY): Payer: Self-pay | Admitting: Oncology

## 2024-07-18 DIAGNOSIS — J181 Lobar pneumonia, unspecified organism: Secondary | ICD-10-CM | POA: Diagnosis not present

## 2024-07-18 DIAGNOSIS — E119 Type 2 diabetes mellitus without complications: Secondary | ICD-10-CM | POA: Diagnosis not present

## 2024-07-18 DIAGNOSIS — I5032 Chronic diastolic (congestive) heart failure: Secondary | ICD-10-CM | POA: Diagnosis not present

## 2024-07-18 DIAGNOSIS — J9601 Acute respiratory failure with hypoxia: Secondary | ICD-10-CM | POA: Diagnosis not present

## 2024-07-18 LAB — CBC
HCT: 29.7 % — ABNORMAL LOW (ref 39.0–52.0)
Hemoglobin: 8.2 g/dL — ABNORMAL LOW (ref 13.0–17.0)
MCH: 21.9 pg — ABNORMAL LOW (ref 26.0–34.0)
MCHC: 27.6 g/dL — ABNORMAL LOW (ref 30.0–36.0)
MCV: 79.4 fL — ABNORMAL LOW (ref 80.0–100.0)
Platelets: 212 K/uL (ref 150–400)
RBC: 3.74 MIL/uL — ABNORMAL LOW (ref 4.22–5.81)
RDW: 21.1 % — ABNORMAL HIGH (ref 11.5–15.5)
WBC: 8.5 K/uL (ref 4.0–10.5)
nRBC: 0 % (ref 0.0–0.2)

## 2024-07-18 LAB — GLUCOSE, CAPILLARY
Glucose-Capillary: 130 mg/dL — ABNORMAL HIGH (ref 70–99)
Glucose-Capillary: 153 mg/dL — ABNORMAL HIGH (ref 70–99)
Glucose-Capillary: 130 mg/dL — ABNORMAL HIGH (ref 70–99)
Glucose-Capillary: 137 mg/dL — ABNORMAL HIGH (ref 70–99)

## 2024-07-18 LAB — BASIC METABOLIC PANEL WITH GFR
Anion gap: 10 (ref 5–15)
BUN: 12 mg/dL (ref 8–23)
CO2: 25 mmol/L (ref 22–32)
Calcium: 8.8 mg/dL — ABNORMAL LOW (ref 8.9–10.3)
Chloride: 101 mmol/L (ref 98–111)
Creatinine, Ser: 1.35 mg/dL — ABNORMAL HIGH (ref 0.61–1.24)
GFR, Estimated: 52 mL/min — ABNORMAL LOW (ref >60)
Glucose, Bld: 126 mg/dL — ABNORMAL HIGH (ref 70–99)
Potassium: 4.4 mmol/L (ref 3.5–5.1)
Sodium: 136 mmol/L (ref 135–145)

## 2024-07-18 MED ORDER — SPIRONOLACTONE 25 MG PO TABS
25.0000 mg | ORAL_TABLET | Freq: Every day | ORAL | Status: DC
  Administered 2024-07-18 – 2024-07-27 (×10): 25 mg via ORAL
  Filled 2024-07-18 (×10): qty 1

## 2024-07-18 MED ORDER — FUROSEMIDE 20 MG PO TABS
20.0000 mg | ORAL_TABLET | Freq: Every day | ORAL | Status: DC
  Administered 2024-07-19: 20 mg via ORAL
  Filled 2024-07-18: qty 1

## 2024-07-18 MED ORDER — POLYETHYL GLYCOL-PROPYL GLYCOL 0.4-0.3 % OP SOLN: 1.0000 [drp] | Freq: Three times a day (TID) | OPHTHALMIC | Status: DC | PRN

## 2024-07-18 MED ORDER — POLYVINYL ALCOHOL 1.4 % OP SOLN: 1.0000 [drp] | Freq: Three times a day (TID) | OPHTHALMIC | Status: DC | PRN

## 2024-07-18 MED ORDER — FELODIPINE ER 2.5 MG PO TB24
10.0000 mg | ORAL_TABLET | Freq: Every morning | ORAL | Status: DC
  Administered 2024-07-18 – 2024-07-27 (×10): 10 mg via ORAL
  Filled 2024-07-18 (×10): qty 4

## 2024-07-18 MED ORDER — METHOCARBAMOL 500 MG PO TABS
500.0000 mg | ORAL_TABLET | Freq: Every evening | ORAL | Status: DC | PRN
  Filled 2024-07-18: qty 1

## 2024-07-18 MED ORDER — FUROSEMIDE 10 MG/ML IJ SOLN
40.0000 mg | Freq: Once | INTRAMUSCULAR | Status: AC
  Administered 2024-07-18: 40 mg via INTRAVENOUS
  Filled 2024-07-18: qty 4

## 2024-07-18 NOTE — Progress Notes (Addendum)
 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  Admitted for acute hypoxic and hypercapnic respiratory failure secondary to left lower lobe pneumonia, requiring BiPAP.  Consultants None  Procedures/Events 8/14 admit for acute respiratory failure, on BiPAP   Assessment and Plan: Acute hypoxic and hypercapnic respiratory failure due to community-acquired pneumonia of left lower lobe Asthma Oxygenation reportedly in the 70s at home, high 80s on nonrebreather in stepdown.  Placed on BiPAP.   CT showed left lower lobe pneumonia.  No PE.  COVID, influenza, RSV negative. Off BiPAP since 8/15 AM. Now on 15 L high flow.  Oxygenation marginal low 90s. Asthma appears stable. Continue Breo Ellipta  and albuterol  as needed. Will continue empiric antibiotics, high flow oxygen .  Wean as tolerated.   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  Scheduled for PET/CT 8/19.  Hemoglobin stable.  Continue iron supplement on discharge.   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  Recently Lasix  was changed from daily to as needed dosing and spironolactone  cut in half due to low blood pressures.  Appears to have some increasing peripheral edema on admission.  BP is elevated on admission. Resume low-dose Lasix , spironolactone ; continue on Coreg , losartan , BiDil  Will give one-time dose IV Lasix    PMH pulmonary embolism  Now off Eliquis  due to GI bleeding.   Type 2 diabetes Currently diet controlled.  Patient states he has been taken off all of his diabetes meds by his PCP due to improved glycemic control with occasional  hypoglycemia.  Reports last A1c was <6.0%. Stable. SSI   CKD stage IIIa  Renal function stable.     Hypertension  Continue losartan , Coreg , spironolactone , BiDil .   Hyperlipidemia  Continue simvastatin .   OSA  Continue CPAP nightly when off high flow   Ascending thoracic aortic aneurysm  Seen again on CT, measuring 4.8 cm.  Follows with CT surgery.   Chronic pain  Continue home Norco with hold parameters.  Obesity class II  Body mass index is 39.6 kg/m.    Subjective:  Feels good Breathing fine No complaints  Physical Exam: Vitals:   07/18/24 0800 07/18/24 0815 07/18/24 0845 07/18/24 0850  BP: (!) 191/83     Pulse: 71 71 70 69  Resp:  (!) 22 (!) 25 (!) 27  Temp:      TempSrc:      SpO2: 94% 98% 94% 95%  Weight:      Height:       Physical Exam Vitals reviewed.  Constitutional:      General: He is not in acute distress.    Appearance: He is not ill-appearing or toxic-appearing.  Cardiovascular:     Rate and Rhythm: Normal rate and regular rhythm.     Heart sounds: No murmur heard. Pulmonary:     Effort: Pulmonary effort is normal. No respiratory distress.     Breath sounds: Rales present. No wheezing or rhonchi.  Neurological:     Mental Status: He is alert.  Psychiatric:        Mood and Affect: Mood normal.        Behavior: Behavior normal.     Data Reviewed: CBG stable.  Creatinine stable 1.35. Hemoglobin stable at 8.2  Family Communication: wife at bedside  Disposition: Status is: Inpatient Remains inpatient appropriate because: hypoxia     Time spent: 20 minutes  Author: Toribio Door, MD 07/18/2024 9:20 AM  For on call review [email or web address].

## 2024-07-18 NOTE — Plan of Care (Signed)

## 2024-07-18 NOTE — Evaluation (Signed)
 Physical Therapy Evaluation Patient Details Name: Bradley Alvarez MRN: [id number] DOB: Jul 01, 1941 Today's Date: 07/18/2024  History of Present Illness  83 year old male PMH  presented 07/14/24  with shortness of breath.  Admitted for acute hypoxic and hypercapnic respiratory failure secondary to left lower lobe pneumonia, requiring BiPAP. 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  Clinical Impression  Pt admitted with above diagnosis.  Pt currently with functional limitations due to the deficits listed below (see PT Problem List). Pt will benefit from acute skilled PT to increase their independence and safety with mobility to allow discharge.     The patient is alert, although sleepy,. Patient able to mobilize to sitting  with no assistance. Patient stood at standup Rw and stepped to recliner with miin assistance. Patient on 35L/65% HHFNC. Spo2 88% with activity, 95% after settled in recliner.  Patient ambulatory with his standup Rollator PTA. Patient resides with spouse.  Patient may benefit from  post acute rehab, depending on progress.  Patient will benefit from continued inpatient follow up therapy, <3 hours/day.      If plan is discharge home, recommend the following: A lot of help with walking and/or transfers;A lot of help with bathing/dressing/bathroom;Assistance with cooking/housework;Assist for transportation;Help with stairs or ramp for entrance   Can travel by private vehicle   No    Equipment Recommendations None recommended by PT  Recommendations for Other Services       Functional Status Assessment Patient has had a recent decline in their functional status and demonstrates the ability to make significant improvements in function in a reasonable and predictable amount of time.     Precautions / Restrictions Precautions Precautions: Fall Precaution/Restrictions  Comments: on HHFNC, watch sats Restrictions Weight Bearing Restrictions Per Provider Order: No      Mobility  Bed Mobility Overal bed mobility: Needs Assistance Bed Mobility: Supine to Sit     Supine to sit: Supervision, HOB elevated, Used rails          Transfers Overall transfer level: Needs assistance Equipment used: Rollator (4 wheels) Transfers: Sit to/from Stand, Bed to chair/wheelchair/BSC Sit to Stand: Min assist           General transfer comment: patient stood up at standup RW with min steady assistance, then able to step to recliner    Ambulation/Gait                  Stairs            Wheelchair Mobility     Tilt Bed    Modified Rankin (Stroke Patients Only)       Balance Overall balance assessment: Needs assistance Sitting-balance support: Bilateral upper extremity supported, Feet supported Sitting balance-Leahy Scale: Fair     Standing balance support: Bilateral upper extremity supported, During functional activity, Reliant on assistive device for balance Standing balance-Leahy Scale: Fair                               Pertinent Vitals/Pain Pain Assessment Pain Assessment: No/denies pain    Home Living Family/patient expects to be discharged to:: Private residence Living Arrangements: Spouse/significant other Available Help at Discharge: Family;Available 24 hours/day Type of Home: House Home Access: Stairs to enter Entrance Stairs-Rails: Left Entrance Stairs-Number of Steps: 3   Home Layout: One level Home Equipment: Rollator (4 wheels);Cane - single Librarian, academic (2  wheels) Additional Comments: upright rollator    Prior Function Prior Level of Function : Independent/Modified Independent             Mobility Comments: uses  upright RW       Extremity/Trunk Assessment   Upper Extremity Assessment Upper Extremity Assessment: Overall WFL for tasks assessed    Lower Extremity  Assessment Lower Extremity Assessment: Overall WFL for tasks assessed    Cervical / Trunk Assessment Cervical / Trunk Assessment: Normal  Communication   Communication Communication: No apparent difficulties    Cognition Arousal: Alert Behavior During Therapy: WFL for tasks assessed/performed   PT - Cognitive impairments: No apparent impairments                         Following commands: Intact       Cueing       General Comments      Exercises     Assessment/Plan    PT Assessment Patient needs continued PT services  PT Problem List Decreased strength;Decreased mobility;Decreased knowledge of precautions;Decreased activity tolerance;Cardiopulmonary status limiting activity;Decreased balance       PT Treatment Interventions DME instruction;Therapeutic activities;Gait training;Therapeutic exercise;Patient/family education;Functional mobility training    PT Goals (Current goals can be found in the Care Plan section)  Acute Rehab PT Goals Patient Stated Goal: to get moving PT Goal Formulation: With patient/family Time For Goal Achievement: 08/01/24 Potential to Achieve Goals: Good    Frequency Min 2X/week     Co-evaluation               AM-PAC PT 6 Clicks Mobility  Outcome Measure Help needed turning from your back to your side while in a flat bed without using bedrails?: A Little Help needed moving from lying on your back to sitting on the side of a flat bed without using bedrails?: A Little Help needed moving to and from a bed to a chair (including a wheelchair)?: A Little Help needed standing up from a chair using your arms (e.g., wheelchair or bedside chair)?: A Little Help needed to walk in hospital room?: Total Help needed climbing 3-5 steps with a railing? : Total 6 Click Score: 14    End of Session Equipment Utilized During Treatment: Gait belt Activity Tolerance: Patient tolerated treatment well Patient left: in chair;with call  bell/phone within reach;with family/visitor present Nurse Communication: Mobility status PT Visit Diagnosis: Unsteadiness on feet (R26.81);Other abnormalities of gait and mobility (R26.89);Muscle weakness (generalized) (M62.81);Difficulty in walking, not elsewhere classified (R26.2)    Time: 8884-8853 PT Time Calculation (min) (ACUTE ONLY): 31 min   Charges:   PT Evaluation $PT Eval Low Complexity: 1 Low PT Treatments $Therapeutic Activity: 8-22 mins PT General Charges $$ ACUTE PT VISIT: 1 Visit         Darice Potters PT Acute Rehabilitation Services Office [phone]   Potters Darice Norris 07/18/2024, 12:37 PM

## 2024-07-19 ENCOUNTER — Inpatient Hospital Stay (HOSPITAL_COMMUNITY)

## 2024-07-19 ENCOUNTER — Encounter (HOSPITAL_COMMUNITY): Admission: RE | Admit: 2024-07-19 | Source: Ambulatory Visit

## 2024-07-19 ENCOUNTER — Telehealth: Payer: Self-pay | Admitting: *Deleted

## 2024-07-19 ENCOUNTER — Ambulatory Visit: Payer: Self-pay | Admitting: Oncology

## 2024-07-19 DIAGNOSIS — J181 Lobar pneumonia, unspecified organism: Secondary | ICD-10-CM | POA: Diagnosis not present

## 2024-07-19 DIAGNOSIS — J9601 Acute respiratory failure with hypoxia: Secondary | ICD-10-CM | POA: Diagnosis not present

## 2024-07-19 DIAGNOSIS — E119 Type 2 diabetes mellitus without complications: Secondary | ICD-10-CM | POA: Diagnosis not present

## 2024-07-19 DIAGNOSIS — I5032 Chronic diastolic (congestive) heart failure: Secondary | ICD-10-CM | POA: Diagnosis not present

## 2024-07-19 LAB — GLUCOSE, CAPILLARY
Glucose-Capillary: 114 mg/dL — ABNORMAL HIGH (ref 70–99)
Glucose-Capillary: 159 mg/dL — ABNORMAL HIGH (ref 70–99)
Glucose-Capillary: 142 mg/dL — ABNORMAL HIGH (ref 70–99)
Glucose-Capillary: 119 mg/dL — ABNORMAL HIGH (ref 70–99)
Glucose-Capillary: 144 mg/dL — ABNORMAL HIGH (ref 70–99)

## 2024-07-19 MED ORDER — FUROSEMIDE 10 MG/ML IJ SOLN
40.0000 mg | Freq: Two times a day (BID) | INTRAMUSCULAR | Status: AC
  Administered 2024-07-19 – 2024-07-21 (×4): 40 mg via INTRAVENOUS
  Filled 2024-07-19 (×4): qty 4

## 2024-07-19 MED ORDER — POLYETHYLENE GLYCOL 3350 17 G PO PACK
17.0000 g | PACK | Freq: Two times a day (BID) | ORAL | Status: DC
  Administered 2024-07-19 – 2024-07-27 (×14): 17 g via ORAL
  Filled 2024-07-19 (×15): qty 1

## 2024-07-19 MED ORDER — ENOXAPARIN SODIUM 40 MG/0.4ML IJ SOSY: 40.0000 mg | PREFILLED_SYRINGE | INTRAMUSCULAR | Status: DC

## 2024-07-19 MED ORDER — SENNA 8.6 MG PO TABS
1.0000 | ORAL_TABLET | Freq: Every day | ORAL | Status: DC
  Administered 2024-07-19 – 2024-07-23 (×5): 8.6 mg via ORAL
  Filled 2024-07-19 (×5): qty 1

## 2024-07-19 NOTE — Telephone Encounter (Signed)
 Daughter, Bradley Alvarez presented to clinic this evening to ask why PET scan was cancelled and the tumor board appointment he had. He is currently inpatient at [facility]. Explained that PET scan is not able to be done as inpatient per insurance company and explained about what tumor board is and most likely it needs to PET before case can be discussed. She appreciated the info and will share with her mother.

## 2024-07-19 NOTE — Progress Notes (Signed)
   07/18/24 2311  BiPAP/CPAP/SIPAP  BiPAP/CPAP/SIPAP Pt Type Adult  BiPAP/CPAP/SIPAP SERVO  Mask Type Full face mask  Mask Size Large  Set Rate 18 breaths/min  Respiratory Rate 21 breaths/min  IPAP 12 cmH20  EPAP 6 cmH2O  FiO2 (%) 60 %  Minute Ventilation 12.6  Leak 24  Peak Inspiratory Pressure (PIP) 15  Tidal Volume (Vt) 716  Patient Home Machine No  Patient Home Mask No  Patient Home Tubing No  Auto Titrate No  Press High Alarm 25 cmH2O  Press Low Alarm 4 cmH2O  Device Plugged into RED Power Outlet Yes

## 2024-07-19 NOTE — Progress Notes (Signed)
 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  Admitted for acute hypoxic and hypercapnic respiratory failure secondary to left lower lobe pneumonia, requiring BiPAP.  Condition stabilized rapidly but he still remains on high flow oxygen  with no opportunity for progression.  Consultants None  Procedures/Events 8/14 admit for acute respiratory failure, on BiPAP   Assessment and Plan: Acute hypoxic and hypercapnic respiratory failure due to community-acquired pneumonia of left lower lobe Asthma Oxygenation reportedly in the 70s at home, high 80s on nonrebreather in stepdown.  Placed on BiPAP on admission.   CT showed left lower lobe pneumonia.  No PE.  COVID, influenza, RSV negative. Off daytime BiPAP since 8/15 AM.  Still on 20 L high flow with no change.  SpO2 mid 90s. Continue Breo Ellipta  and albuterol  as needed. Will continue empiric antibiotics, high flow oxygen .  Wean as tolerated. Repeat chest x-ray today, diurese.  If no improvement next 24 hours, consider pulmonology consultation.   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  Scheduled for PET/CT 8/19.  Hemoglobin stable.  Continue iron supplement on discharge.   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  Recently Lasix  was changed from daily to as needed dosing and spironolactone  cut in half due to low blood pressures.  Appears to have some increasing peripheral edema on admission.  BP is elevated on admission. Continue spironolactone , Coreg , losartan , BiDil  Schedule IV Lasix  today, strict I/O   PMH pulmonary embolism  Now off Eliquis  due to  GI bleeding.   Type 2 diabetes Currently diet controlled.  Patient states he has been taken off all of his diabetes meds by his PCP due to improved glycemic control with occasional hypoglycemia.  Reports last A1c was <6.0%. Stable. SSI   CKD stage IIIa  Renal function stable.     Hypertension  Continue losartan , Coreg , spironolactone , BiDil .   Hyperlipidemia  Continue simvastatin .   OSA  Continue CPAP nightly when off high flow   Ascending thoracic aortic aneurysm  Seen again on CT, measuring 4.8 cm.  Follows with CT surgery.   Chronic pain  Continue home Norco with hold parameters.   Obesity class II  Body mass index is 39.6 kg/m.      Subjective:  Feels ok, was sleepy yesterday, used BiPAP last night. Breathing ok. No pain. No BM in awhile.  Physical Exam: Vitals:   07/19/24 0900 07/19/24 1000 07/19/24 1100 07/19/24 1106  BP: (!) 151/60 (!) 146/47 134/65   Pulse: 68 65 63 62  Resp: (!) 23 (!) 23 (!) 25 20  Temp:      TempSrc:      SpO2: 93% 93% 96% 94%  Weight:      Height:       Physical Exam Vitals reviewed.  Constitutional:      General: He is not in acute distress.    Appearance: He is not ill-appearing or toxic-appearing.  Cardiovascular:     Rate and Rhythm: Normal rate and regular rhythm.     Heart sounds: No murmur heard. Pulmonary:     Effort: No respiratory distress.     Breath sounds: Rales present. No rhonchi.  Comments: Mild increased respiratory effort. Musculoskeletal:     Right lower leg: Edema present.     Left lower leg: Edema present.  Neurological:     Mental Status: He is alert.  Psychiatric:        Mood and Affect: Mood normal.        Behavior: Behavior normal.     Data Reviewed: CBG stable  Family Communication: wife at bedside  Disposition: Status is: Inpatient Remains inpatient appropriate because: hypoxia     Time spent: 35 minutes  Author: Toribio Door, MD 07/19/2024 11:34 AM  For on call  review [email or web address].

## 2024-07-20 ENCOUNTER — Inpatient Hospital Stay: Admitting: Nurse Practitioner

## 2024-07-20 ENCOUNTER — Encounter (HOSPITAL_COMMUNITY): Payer: Self-pay | Admitting: Oncology

## 2024-07-20 ENCOUNTER — Other Ambulatory Visit: Payer: Self-pay

## 2024-07-20 DIAGNOSIS — J9621 Acute and chronic respiratory failure with hypoxia: Secondary | ICD-10-CM | POA: Diagnosis not present

## 2024-07-20 DIAGNOSIS — J986 Disorders of diaphragm: Secondary | ICD-10-CM | POA: Diagnosis not present

## 2024-07-20 DIAGNOSIS — J9811 Atelectasis: Secondary | ICD-10-CM

## 2024-07-20 DIAGNOSIS — J9601 Acute respiratory failure with hypoxia: Secondary | ICD-10-CM | POA: Diagnosis not present

## 2024-07-20 DIAGNOSIS — I10 Essential (primary) hypertension: Secondary | ICD-10-CM | POA: Diagnosis not present

## 2024-07-20 DIAGNOSIS — I5032 Chronic diastolic (congestive) heart failure: Secondary | ICD-10-CM | POA: Diagnosis not present

## 2024-07-20 DIAGNOSIS — G4733 Obstructive sleep apnea (adult) (pediatric): Secondary | ICD-10-CM

## 2024-07-20 DIAGNOSIS — Z86711 Personal history of pulmonary embolism: Secondary | ICD-10-CM

## 2024-07-20 DIAGNOSIS — C169 Malignant neoplasm of stomach, unspecified: Secondary | ICD-10-CM | POA: Diagnosis not present

## 2024-07-20 LAB — GLUCOSE, CAPILLARY
Glucose-Capillary: 94 mg/dL (ref 70–99)
Glucose-Capillary: 126 mg/dL — ABNORMAL HIGH (ref 70–99)
Glucose-Capillary: 105 mg/dL — ABNORMAL HIGH (ref 70–99)
Glucose-Capillary: 140 mg/dL — ABNORMAL HIGH (ref 70–99)

## 2024-07-20 LAB — BASIC METABOLIC PANEL WITH GFR
Anion gap: 10 (ref 5–15)
BUN: 16 mg/dL (ref 8–23)
CO2: 29 mmol/L (ref 22–32)
Calcium: 8.6 mg/dL — ABNORMAL LOW (ref 8.9–10.3)
Chloride: 94 mmol/L — ABNORMAL LOW (ref 98–111)
Creatinine, Ser: 1.29 mg/dL — ABNORMAL HIGH (ref 0.61–1.24)
GFR, Estimated: 55 mL/min — ABNORMAL LOW (ref >60)
Glucose, Bld: 108 mg/dL — ABNORMAL HIGH (ref 70–99)
Potassium: 3.8 mmol/L (ref 3.5–5.1)
Sodium: 133 mmol/L — ABNORMAL LOW (ref 135–145)

## 2024-07-20 LAB — CULTURE, BLOOD (ROUTINE X 2)
Culture: NO GROWTH
Culture: NO GROWTH
Special Requests: ADEQUATE
Special Requests: ADEQUATE

## 2024-07-20 LAB — CBC
HCT: 28.6 % — ABNORMAL LOW (ref 39.0–52.0)
Hemoglobin: 8.1 g/dL — ABNORMAL LOW (ref 13.0–17.0)
MCH: 22.3 pg — ABNORMAL LOW (ref 26.0–34.0)
MCHC: 28.3 g/dL — ABNORMAL LOW (ref 30.0–36.0)
MCV: 78.6 fL — ABNORMAL LOW (ref 80.0–100.0)
Platelets: 195 K/uL (ref 150–400)
RBC: 3.64 MIL/uL — ABNORMAL LOW (ref 4.22–5.81)
RDW: 20.6 % — ABNORMAL HIGH (ref 11.5–15.5)
WBC: 6.7 K/uL (ref 4.0–10.5)
nRBC: 0 % (ref 0.0–0.2)

## 2024-07-20 MED ORDER — ORAL CARE MOUTH RINSE: 15.0000 mL | OROMUCOSAL | Status: DC | PRN

## 2024-07-20 NOTE — Plan of Care (Signed)
  Problem: Clinical Measurements: Goal: Ability to maintain clinical measurements within normal limits will improve Outcome: Progressing   Problem: Clinical Measurements: Goal: Will remain free from infection Outcome: Progressing   Problem: Clinical Measurements: Goal: Cardiovascular complication will be avoided Outcome: Progressing   Problem: Activity: Goal: Risk for activity intolerance will decrease Outcome: Progressing

## 2024-07-20 NOTE — Progress Notes (Signed)
 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  Admitted for acute hypoxic and hypercapnic respiratory failure secondary to left lower lobe pneumonia, requiring BiPAP.  Condition stabilized rapidly but he still remains on high flow oxygen  with no opportunity for progression.  Pulmonology consulted for further recommendations.  Assessment and Plan: Acute hypoxic and hypercapnic respiratory failure Left lower lobe pneumonia- Patient is currently on high flow oxygen  35 L, 65%. Continues to be on BiPAP at night. Repeat chest xray shows bibasilar atelectasis, left lower infiltrate. Patient will be continued on Rocephin  and azithromycin  therapy. Continue to wean oxygen  as tolerated. Continue Breo Ellipta , albuterol  as needed. Continue IV Lasix . Pulmonology consulted for further management. Advised to work with PT, encouraged incentive spirometry.  Gastric adenocarcinoma Recent diagnosis.  Outpatient oncology follow-up. Continue iron supplements.  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 Patient will be continued on IV Lasix  40mg  q12. Monitor strict input output, daily weights. Continue Coreg , ARB, Aldactone , BiDil  therapy.  History of pulmonary embolism- Off Eliquis  due to GI bleeding.  Type 2 diabetes mellitus: A1c less than 6.   CKD stage III-stable. Avoid nephrotoxic's.  Hypertension: Continue Coreg , losartan , Aldactone .  Hyperlipidemia-on simvastatin .  OSA-CPAP at night.  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       Out of bed to chair. Incentive spirometry. Nursing supportive care. Fall, aspiration  precautions. Diet:  Diet Orders (From admission, onward)     Start     Ordered   07/15/24 1009  Diet regular Room service appropriate? Yes; Fluid consistency: Thin  Diet effective now       Question Answer Comment  Room service appropriate? Yes   Fluid consistency: Thin      07/15/24 1008           DVT prophylaxis: SCDs Start: 07/14/24 1857  Level of care: Stepdown   Code Status: Limited: Do not attempt resuscitation (DNR) -DNR-LIMITED -Do Not Intubate/DNI   Subjective: Patient is seen and examined today morning. He is still requiring high flow O2. Family at bedside. I advised to work with PT, incentive spirometry. Discussed with daughter about his care plan.  Physical Exam: Vitals:   07/20/24 1153 07/20/24 1200 07/20/24 1205 07/20/24 1300  BP:  (!) 156/74  (!) 159/67  Pulse:  66 69 70  Resp:  18 18 20   Temp: 99.3 F (37.4 C)     TempSrc: Oral     SpO2:  93% 95% 95%  Weight:      Height:        General - Elderly obese African American male, mild respiratory distress HEENT - PERRLA, EOMI, atraumatic head, non tender sinuses. Lung - Clear, basal rales, rhonchi, wheezes. Heart - S1, S2 heard, no murmurs, rubs, 1+ pedal edema. Abdomen - Soft, non tender, obese, bowel sounds good Neuro - Alert, awake and oriented x 3, non focal exam. Skin - Warm and dry.  Data Reviewed:      Latest Ref Rng & Units 07/20/2024    5:54 AM 07/18/2024    3:05 AM 07/17/2024    7:48 AM  CBC  WBC 4.0 - 10.5 K/uL 6.7  8.5  8.7  Hemoglobin 13.0 - 17.0 g/dL 8.1  8.2  8.2   Hematocrit 39.0 - 52.0 % 28.6  29.7  30.0   Platelets 150 - 400 K/uL 195  212  222       Latest Ref Rng & Units 07/20/2024    5:54 AM 07/18/2024    3:05 AM 07/17/2024    7:48 AM  BMP  Glucose 70 - 99 mg/dL 891  873  880   BUN 8 - 23 mg/dL 16  12  11    Creatinine 0.61 - 1.24 mg/dL 8.70  8.64  8.74   Sodium 135 - 145 mmol/L 133  136  134   Potassium 3.5 - 5.1 mmol/L 3.8  4.4  4.1   Chloride 98 - 111 mmol/L 94  101   100   CO2 22 - 32 mmol/L 29  25  28    Calcium 8.9 - 10.3 mg/dL 8.6  8.8  8.2    DG CHEST PORT 1 VIEW Result Date: 07/19/2024 CLINICAL DATA:  Lobar pneumonia follow-up. EXAM: PORTABLE CHEST 1 VIEW COMPARISON:  Chest radiograph dated 07/14/2024. FINDINGS: There is mild eventration of the right hemidiaphragm. There is shallow inspiration with bibasilar atelectasis. An infiltrative process at the left lung base is not excluded. No pleural effusion or pneumothorax. Mild cardiomegaly. No acute osseous pathology. IMPRESSION: Shallow inspiration with bibasilar atelectasis. An infiltrative process at the left lung base is not excluded. Electronically Signed   By: Vanetta Chou M.D.   On: 07/19/2024 14:13    Family Communication: Discussed with patient, family at bedside. They understand and agree. All questions answered.  Disposition: Status is: Inpatient Remains inpatient appropriate because: severity of illness  Planned Discharge Destination: Home with Home Health     Time spent: 53 minutes  Author: Concepcion Riser, MD 07/20/2024 1:49 PM Secure chat 7am to 7pm For on call review [email or web address].

## 2024-07-20 NOTE — Progress Notes (Signed)
 Physical Therapy Treatment Patient Details Name: Bradley Alvarez MRN: [id number] DOB: [birth year] Today's Date: 07/20/2024   History of Present Illness 83 year old male PMH  presented 07/14/24  with shortness of breath.  Admitted for acute hypoxic and hypercapnic respiratory failure secondary to left lower lobe pneumonia, requiring BiPAP. 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    PT Comments  Pt seen in SDICU RM# [id number] PT - Cognition Comments: AxO x 3 pleasant and motivated.  Was Ind amb with an upright B platform Rollator past 2 years IND stated Pt. Assisted to EOB to stand required much effort.  General bed mobility comments: increased time and use of rail and caution for multiple lines/leads.  General transfer comment: pt able to rise from elevated bed with good use og hands to steady self. General Gait Details: Performed Pre Gait Marching in place x 2 min before fatigue B LE R>L using upright Rollator.  HR avg 75, RR 19, BP 142/84 and sats avg 90% while on 25 lts at 65% HHFNC. Pt positioned up right in recliner.  Spouse visiting. Progress is slow.  LPT has rec Pt will need ST Rehab at SNF to address mobility and functional decline prior to safely returning home.    If plan is discharge home, recommend the following: A lot of help with walking and/or transfers;A lot of help with bathing/dressing/bathroom;Assistance with cooking/housework;Assist for transportation;Help with stairs or ramp for entrance   Can travel by private vehicle     No  Equipment Recommendations  None recommended by PT    Recommendations for Other Services       Precautions / Restrictions Precautions Precautions: Fall Precaution/Restrictions Comments: on HHFNC, monitor vitals Restrictions Weight Bearing Restrictions Per Provider Order: Yes     Mobility  Bed Mobility Overal bed mobility: Needs  Assistance Bed Mobility: Supine to Sit     Supine to sit: Supervision, HOB elevated, Used rails     General bed mobility comments: increased time and use of rail and caution for multiple lines/leads    Transfers Overall transfer level: Needs assistance Equipment used: Rollator (4 wheels) Transfers: Sit to/from Stand Sit to Stand: Contact guard assist, Min assist           General transfer comment: pt able to rise from elevated bed with good use og hands to steady self.    Ambulation/Gait               General Gait Details: Performed Pre Gait Marching in place x 2 min before fatigue B LE R>L using upright Rollator.  HR avg 75, RR 19, BP 142/84 and sats avg 90% while on 25 lts at 65% HHFNC.   Stairs             Wheelchair Mobility     Tilt Bed    Modified Rankin (Stroke Patients Only)       Balance                                            Communication Communication Communication: No apparent difficulties  Cognition Arousal: Alert Behavior During Therapy: WFL for tasks assessed/performed   PT - Cognitive impairments: No apparent impairments  PT - Cognition Comments: AxO x 3 pleasant and motivated.  Was Ind amb with an upright B platform Rollator past 2 years IND stated Pt. Following commands: Intact      Cueing Cueing Techniques: Verbal cues  Exercises      General Comments        Pertinent Vitals/Pain Pain Assessment Pain Assessment: No/denies pain    Home Living                          Prior Function            PT Goals (current goals can now be found in the care plan section) Progress towards PT goals: Progressing toward goals    Frequency    Min 2X/week      PT Plan      Co-evaluation              AM-PAC PT 6 Clicks Mobility   Outcome Measure  Help needed turning from your back to your side while in a flat bed without using bedrails?: A  Little Help needed moving from lying on your back to sitting on the side of a flat bed without using bedrails?: A Little Help needed moving to and from a bed to a chair (including a wheelchair)?: A Little Help needed standing up from a chair using your arms (e.g., wheelchair or bedside chair)?: A Little Help needed to walk in hospital room?: A Lot Help needed climbing 3-5 steps with a railing? : Total 6 Click Score: 15    End of Session Equipment Utilized During Treatment: Gait belt Activity Tolerance: Patient tolerated treatment well Patient left: in chair;with call bell/phone within reach;with family/visitor present Nurse Communication: Mobility status PT Visit Diagnosis: Unsteadiness on feet (R26.81);Other abnormalities of gait and mobility (R26.89);Muscle weakness (generalized) (M62.81);Difficulty in walking, not elsewhere classified (R26.2)     Time: 8785-8761 PT Time Calculation (min) (ACUTE ONLY): 24 min  Charges:    $Gait Training: 8-22 mins (pre gait) $Therapeutic Activity: 8-22 mins PT General Charges $$ ACUTE PT VISIT: 1 Visit                     {Tayten Heber  PTA Acute  Rehabilitation Services Office M-F          [phone]

## 2024-07-20 NOTE — Progress Notes (Signed)
 RT removed pt from BIPAP and placed on HHFNC. Pt doing well at this time.

## 2024-07-20 NOTE — Plan of Care (Signed)
  Problem: Clinical Measurements: Goal: Ability to maintain clinical measurements within normal limits will improve Outcome: Progressing Goal: Diagnostic test results will improve Outcome: Progressing Goal: Respiratory complications will improve Outcome: Progressing   Problem: Activity: Goal: Risk for activity intolerance will decrease Outcome: Progressing   Problem: Nutrition: Goal: Adequate nutrition will be maintained Outcome: Progressing   Problem: Pain Managment: Goal: General experience of comfort will improve and/or be controlled Outcome: Progressing   Problem: Skin Integrity: Goal: Risk for impaired skin integrity will decrease Outcome: Progressing

## 2024-07-20 NOTE — Evaluation (Signed)
 Occupational Therapy Evaluation Patient Details Name: Bradley Alvarez DOBEK MRN: [id number] DOB: Aug 18, 1941 Today's Date: 07/20/2024   History of Present Illness   83 year old male who presented on 07/14/24  with shortness of breath.  Admitted for acute hypoxic and hypercapnic respiratory failure secondary to left lower lobe pneumonia. 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     Clinical Impressions The pt is currently presenting below his baseline level of functioning for self-care management. He is limited by the below listed deficits (see OT problem list). During the session, he required set-up assist in bed for simulated upper body dressing, SBA for supine to sit, and mod assist for simulated lower body dressing seated EOB. He demonstrated good static sitting balance and fair+ dynamic sitting balance. He reported feelings of mild generalized weakness and moderately impaired endurance/activity tolerance. Pt noted to be on HFNC. He used 2-3L O2 at night at his baseline. He will benefit from further OT services to maximize his independence with self care tasks and to decrease the risk for restricted participation in meaningful activities. The pt would benefit from short-term SNF rehab, however the pt and his spouse state they prefer for the pt to return home with family support and home therapy at discharge.      If plan is discharge home, recommend the following:   A lot of help with bathing/dressing/bathroom;Assistance with cooking/housework;Assist for transportation;Help with stairs or ramp for entrance     Functional Status Assessment   Patient has had a recent decline in their functional status and demonstrates the ability to make significant improvements in function in a reasonable and predictable amount of time.     Equipment Recommendations   Other (comment) (to be determined  pending functional progress)     Recommendations for Other Services         Precautions/Restrictions   Precautions Precautions: Fall Restrictions Weight Bearing Restrictions Per Provider Order: No Other Position/Activity Restrictions: on HFNC     Mobility Bed Mobility Overal bed mobility: Needs Assistance Bed Mobility: Supine to Sit, Sit to Supine     Supine to sit: Supervision, Used rails, HOB elevated Sit to supine: Mod assist (required assist for BLE)            Balance     Sitting balance-Leahy Scale: Fair         ADL either performed or assessed with clinical judgement   ADL Overall ADL's : Needs assistance/impaired Eating/Feeding: Set up;Sitting   Grooming: Set up;Sitting           Upper Body Dressing : Set up;Sitting   Lower Body Dressing: Moderate assistance;Sitting/lateral leans       Toileting- Clothing Manipulation and Hygiene: Moderate assistance Toileting - Clothing Manipulation Details (indicate cue type and reason): at bedside commode level, based on clinical judgement                          Pertinent Vitals/Pain Pain Assessment Pain Assessment: No/denies pain     Extremity/Trunk Assessment Upper Extremity Assessment Upper Extremity Assessment: RUE deficits/detail;LUE deficits/detail RUE Deficits / Details: AROM WFL. Grip strength 4/5 LUE Deficits / Details: AROM WFL. Grip strength 4/5   Lower Extremity Assessment Lower Extremity Assessment: Generalized weakness;RLE deficits/detail;LLE deficits/detail RLE Deficits / Details: AROM WFL LLE Deficits / Details: AROM WFL       Communication Communication Communication: No apparent difficulties   Cognition Arousal:  Alert Behavior During Therapy: [facility] for tasks assessed/performed        Following commands: Intact       Cueing  General Comments   Cueing Techniques: Verbal cues              Home Living Family/patient expects to be discharged to::  Private residence Living Arrangements: Spouse/significant other Available Help at Discharge: Family Type of Home: House Home Access: Stairs to enter Secretary/administrator of Steps: 2 Entrance Stairs-Rails: Right;Left Home Layout: One level     Bathroom Shower/Tub: Walk-in shower         Home Equipment: Production assistant, radio - single point   Additional Comments: upright rollator      Prior Functioning/Environment Prior Level of Function : Independent/Modified Independent             Mobility Comments: He furniture walks inside the home, uses a cane to ambulate to the car, and uses an upright walker for community ambulation. ADLs Comments: He was modified independent to independent with ADLs, his spouse cooked and cleaned, he did minimal driving, and his son performed the yard work.    OT Problem List: Decreased strength;Decreased activity tolerance;Impaired balance (sitting and/or standing);Decreased knowledge of use of DME or AE;Cardiopulmonary status limiting activity   OT Treatment/Interventions: Self-care/ADL training;Therapeutic exercise;Therapeutic activities;Energy conservation;DME and/or AE instruction;Patient/family education;Balance training      OT Goals(Current goals can be found in the care plan section)   Acute Rehab OT Goals Patient Stated Goal: to get better and return hom e OT Goal Formulation: With patient/family Time For Goal Achievement: 08/03/24 Potential to Achieve Goals: Good ADL Goals Pt Will Perform Upper Body Dressing: with set-up;sitting Pt Will Perform Lower Body Dressing: with supervision;sit to/from stand;sitting/lateral leans Pt Will Transfer to Toilet: with supervision;ambulating Pt Will Perform Toileting - Clothing Manipulation and hygiene: with supervision;sit to/from stand   OT Frequency:  Min 2X/week       AM-PAC OT 6 Clicks Daily Activity     Outcome Measure Help from another person eating meals?: None Help from another  person taking care of personal grooming?: A Little Help from another person toileting, which includes using toliet, bedpan, or urinal?: A Lot Help from another person bathing (including washing, rinsing, drying)?: A Lot Help from another person to put on and taking off regular upper body clothing?: A Little Help from another person to put on and taking off regular lower body clothing?: A Lot 6 Click Score: 16   End of Session Equipment Utilized During Treatment: Oxygen  Nurse Communication: Mobility status  Activity Tolerance: Other (comment) (Fair tolerance) Patient left: in bed;with call bell/phone within reach;with bed alarm set;with family/visitor present  OT Visit Diagnosis: Muscle weakness (generalized) (M62.81);Other abnormalities of gait and mobility (R26.89)                Time: 8466-8444 OT Time Calculation (min): 22 min Charges:  OT General Charges $OT Visit: 1 Visit OT Evaluation $OT Eval Moderate Complexity: 1 Mod    Delanna JINNY Lesches, OTR/L 07/20/2024, 5:42 PM

## 2024-07-20 NOTE — Consult Note (Signed)
 NAME:  Bradley Alvarez, MRN:  [id number], DOB:  09/05/1941, LOS: 6 ADMISSION DATE:  07/14/2024, CONSULTATION DATE:  07/20/24 REFERRING MD:  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  Presented 8/14 with 2 week hx of progressive SOB and using his O2 prn during the day for last week with occasionally dark sputum.  No fevers, nausea or vomiting, CP, and has chronic LE edema.  Reports is not very active at home due to chronic back pain.  Found to be hypoxic in the 70s at home requiring NRB in ER transitioned to BiPAP.  CTA PE neg for PE but showed LLL PNA.  Admitted to [facility] and started on ceftriaxone  and azithromycin .  SARS/ flu/ RSV neg, RVP neg, MRSA PCR neg.  Urine strep and legionella neg. Pt continues to require HHFNC 60% and placed on nocturnal BiPAP given O2 requirements but no longer having increased WOB.  Follow up CXR showing shallow inspiration with ongoing bibasilar atelectasis, LLL infiltrate not excluded.  Pulmonary consulted for further recommendations.    Pertinent  Medical History   Past Medical History:  Diagnosis Date   AAA (abdominal aortic aneurysm) without rupture (HCC)    followed by dr brain--  per office note dated 11-05-2018  3.5cm   Benign essential HTN    cardiologist-- dr tally   Chronic combined systolic (congestive) and diastolic (congestive) heart failure [facility])    cardiologist-- dr  katharyn   CKD (chronic kidney disease), stage III (HCC)    Diabetes mellitus type 2, insulin  dependent (HCC)    followed by dr onita   Diabetic neuropathy (HCC)    DOE (dyspnea on exertion)    Edema of both lower extremities    Full dentures    HOH (hard of hearing)    does wear hearing aids   Hydrocele, bilateral    Lichen planus    Mixed hyperlipidemia    Morbid obesity with BMI of 45.0-49.9, adult (HCC)    Nocturia    OA (osteoarthritis)    OSA on CPAP 10/14/08   per study  AHI  61.8/hr ,  RDI  96.1/hr.    Significant Hospital Events: Including procedures, antibiotic start and stop dates in addition to other pertinent events   8/14 admitted   Interim History / Subjective:  No complaints  Objective    Blood pressure (!) 142/86, pulse 73, temperature 99.5 F (37.5 C), temperature source Axillary, resp. rate 20, height 6' 2 (1.88 m), weight (!) 136.9 kg, SpO2 96%.    FiO2 (%):  [60 %-65 %] 64 % PEEP:  [6 cmH20] 6 cmH20   Intake/Output Summary (Last 24 hours) at 07/20/2024 1053 Last data filed at 07/20/2024 0953 Gross per 24 hour  Intake 84.57 ml  Output 2900 ml  Net -2815.43 ml   Filed Weights   07/18/24 0500 07/19/24 1200 07/20/24 0704  Weight: (!) 139.9 kg 135.6 kg (!) 136.9 kg  Examination: General:  pleasant elderly male sitting in bed in NAD HEENT: MM pink/moist, edentulous, mildly HOH Neuro: Awake, oriented/ appropriate, MAE CV: rr PULM:  non labored on HHFNC 35L, 64% w/ sats 95%, clear anteriorly, diminished R> L bibasilar, no wheeze, no conversational dyspnea GI: obese, soft Extremities: warm/dry, +1 LE edema  Skin: no rashes  Afebrile Net -4.2L Wts > admit 142 now 136kg Imaging reviewed  Resolved problem list   Assessment and Plan   Acute on chronic hypoxic respiratory failure secondary to improving LLL PNA, ongoing atelectasis, deconditioning, OSA on CPAP w/ 2L [place] and chronic narcotic use 2/2 chronic back pain, known right diaphragmatic  paralysis  Hx asthma- no AE - noted to desat to 88% with PT on exertion 8/18, recovered to 95% with rest. - RVP, SARS, MRSA PCR neg, urine legionella/ strep neg P:  - needs aggressive pulm hygiene> stressed IS with atelectasis and OOB, ongoing PT - cont to wean supplemental O2 for sat goal 88-92%  - complete 5 days of CAP coverage, can stop CTX - given normal WOB> try transitioning from [facility] to [facility] HFNC - cont nocturnal CPAP vs BiPAP (pending O2 requirements but not needing for WOB) - cont breo and prn albuterol .  No current wheezing or need for steroids - minimize narcotics as able - cont diuresis per primary team - prn CXR  - DNR/ DNI   Nothing further to add.  PCCM will sign off.  Please call us  back if we can be of any further assistance.   Best Practice (right click and Reselect all SmartList Selections daily)  Per primary team  Labs   CBC: Recent Labs  Lab 07/15/24 0317 07/16/24 0243 07/17/24 0748 07/18/24 0305 07/20/24 0554  WBC 9.1 8.7 8.7 8.5 6.7  HGB 8.9* 8.5* 8.2* 8.2* 8.1*  HCT 32.5* 31.2* 30.0* 29.7* 28.6*  MCV 78.9* 81.0 79.4* 79.4* 78.6*  PLT 229 236 222 212 195    Basic Metabolic Panel: Recent Labs  Lab 07/15/24 0317 07/16/24 0243 07/17/24 0748 07/18/24 0305 07/20/24 0554  NA 136 138 134* 136 133*  K 3.8 4.1 4.1 4.4 3.8  CL 102 100 100 101 94*  CO2 25 26 28 25 29   GLUCOSE 90 112* 119* 126* 108*  BUN 11 12 11 12 16   CREATININE 1.17 1.44* 1.25* 1.35* 1.29*  CALCIUM 8.3* 8.2* 8.2* 8.8* 8.6*   GFR: Estimated Creatinine Clearance: 63.9 mL/min (A) (by C-G formula based on SCr of 1.29 mg/dL (H)). Recent Labs  Lab 07/15/24 0317 07/16/24 0243 07/17/24 0748 07/18/24 0305 07/20/24 0554  PROCALCITON <0.10  --   --   --   --   WBC 9.1 8.7 8.7 8.5 6.7    Liver Function Tests: No results for input(s): AST, ALT, ALKPHOS, BILITOT, PROT, ALBUMIN  in the last 168 hours. No results for input(s): LIPASE, AMYLASE in the last 168  hours. No results for input(s): AMMONIA in the last 168 hours.  ABG    Component Value Date/Time   PHART 7.285 (L) 07/14/2024 1712   PCO2ART 54.0 (H) 07/14/2024 1712   PO2ART 66 (L) 07/14/2024 1712   HCO3 25.6 07/14/2024 1712   TCO2 27 07/14/2024 1712   ACIDBASEDEF 2.0 07/14/2024 1712   O2SAT 90 07/14/2024 1712     Coagulation Profile: Recent Labs  Lab 07/14/24 1344  INR 1.0    Cardiac Enzymes: No results for input(s): CKTOTAL, CKMB, CKMBINDEX, TROPONINI in the last 168 hours.  HbA1C: Hgb A1c MFr Bld  Date/Time  Value Ref Range Status  07/15/2024 03:17 AM 5.9 (H) 4.8 - 5.6 % Final    Comment:    (NOTE)         Prediabetes: 5.7 - 6.4         Diabetes: >6.4         Glycemic control for adults with diabetes: <7.0   09/10/2017 10:56 AM 6.6 (H) 4.8 - 5.6 % Final    Comment:             Prediabetes: 5.7 - 6.4          Diabetes: >6.4          Glycemic control for adults with diabetes: <7.0     CBG: Recent Labs  Lab 07/19/24 1113 07/19/24 1334 07/19/24 1714 07/19/24 2125 07/20/24 0756  GLUCAP 159* 142* 119* 144* 94    Review of Systems:   Review of Systems  Constitutional:  Negative for chills, fever and weight loss.  Respiratory:  Positive for cough, sputum production and shortness of breath. Negative for hemoptysis and wheezing.   Cardiovascular:  Positive for leg swelling. Negative for chest pain.  Gastrointestinal:  Negative for abdominal pain, nausea and vomiting.   Past Medical History:  He,  has a past medical history of AAA (abdominal aortic aneurysm) without rupture (HCC), Benign essential HTN, Chronic combined systolic (congestive) and diastolic (congestive) heart failure (HCC), CKD (chronic kidney disease), stage III (HCC), Diabetes mellitus type 2, insulin  dependent (HCC), Diabetic neuropathy (HCC), DOE (dyspnea on exertion), Edema of both lower extremities, Full dentures, HOH (hard of hearing), Hydrocele, bilateral, Lichen planus, Mixed  hyperlipidemia, Morbid obesity with BMI of 45.0-49.9, adult (HCC), Nocturia, OA (osteoarthritis), and OSA on CPAP (10/14/08).   Surgical History:   Past Surgical History:  Procedure Laterality Date   CARDIAC CATHETERIZATION  per pt yrs ago   was told no blockage   CARDIOVASCULAR STRESS TEST  07-19-2014   dr debby sor   Low risk nuclear study w/ small apical attentuation artifact/  normal LV function and wall motion , ef 53%   CATARACT EXTRACTION W/ INTRAOCULAR LENS  IMPLANT, BILATERAL  2018   COLONOSCOPY W/ BIOPSIES AND POLYPECTOMY     COLONOSCOPY WITH PROPOFOL  N/A 05/16/2016   Procedure: COLONOSCOPY WITH PROPOFOL ;  Surgeon: [name], MD;  Location: [facility];  Service: Endoscopy;  Laterality: N/A;   ESOPHAGOGASTRODUODENOSCOPY N/A 07/01/2024   Procedure: EGD (ESOPHAGOGASTRODUODENOSCOPY);  Surgeon: Just Belvie, MD;  Location: [facility];  Service: Gastroenterology;  Laterality: N/A;   HYDROCELE EXCISION Bilateral 11/15/2018   Procedure: HYDROCELECTOMY ADULT;  Surgeon: [name], Mark, MD;  Location: [facility];  Service: Urology;  Laterality: Bilateral;   KNEE ARTHROSCOPY Right 2009   MULTIPLE TOOTH EXTRACTIONS     ORIF ANKLE FRACTURE Left 09/13/2014   Procedure: Open Reduction Internal Fixation Left Ankle Medial Malleolus;  Surgeon: [name], MD;  Location: [facility] OR;  Service: Orthopedics;  Laterality: Left;   PROSTATECTOMY  1993 approx.  by dr matilda   for prostate enlargement   TOTAL KNEE ARTHROPLASTY Right 08/27/2009   dr jane @[facility]    TOTAL KNEE ARTHROPLASTY  12/20/2012   Procedure: TOTAL KNEE ARTHROPLASTY;  Surgeon: [name], MD;  [facility] OR;  Service: Orthopedics;  Laterality: Left;  left total knee arthroplasty   TRANSTHORACIC ECHOCARDIOGRAM  11-04-2018   dr patwardhan   moderate to severe concentric LVH, ef 56%,  grade 1 diastolic dysfunciton/  mild AV calcification annulus and leaflets without stentosis ,  mild regurg./  moderate LAE/  dilated aortic root, 4.5cm/  mild MV annulus calcification with trace regurg./  trace TR     Social History:   reports that he quit smoking about 52 years ago. His smoking use included cigarettes. He started smoking about 62 years ago. He has a 15 pack-year smoking history. He has quit using smokeless tobacco.  His smokeless tobacco use included snuff and chew. He reports current alcohol  use of about 1.0 standard drink of alcohol  per week. He reports that he does not use drugs.   Family History:  His family history includes Diabetes Mellitus II in his brother, brother, brother, mother, sister, sister, and another family member; Heart attack in his father; Heart disease in his sister, sister and another family member; Hypertension in his mother and another family member; Kidney disease in his brother; Stroke in his brother and another family member.   Allergies No Known Allergies   Home Medications  Prior to Admission medications   Medication Sig Start Date End Date Taking? Authorizing Provider  albuterol  (VENTOLIN  HFA) 108 (90 Base) MCG/ACT inhaler Inhale 2 puffs into the lungs every 6 (six) hours as needed for wheezing or shortness of breath. 01/04/24  Yes Hunsucker, Donnice SAUNDERS, MD  BIDIL  20-37.5 MG tablet Take 2 tablets by mouth 3 (three) times daily. 05/31/24  Yes Ladona Heinz, MD  carvedilol  (COREG ) 25 MG tablet TAKE TWO TABLETS BY MOUTH TWICE DAILY with meals Patient taking differently: Take 25 mg by mouth 2 (two) times daily with a meal. 05/01/22  Yes Cantwell, Celeste C, PA-C  clobetasol  ointment (TEMOVATE ) 0.05 % Apply 1 application  topically daily as needed (for itching- Lichen planus). 09/23/13  Yes [provider]  diclofenac sodium (VOLTAREN) 1 % GEL Apply 2 g topically 4 (four) times daily as needed (for pain).   Yes [provider]  Dulaglutide (TRULICITY) 0.75 MG/0.5ML SOAJ Inject 0.75 mg into the skin every Wednesday.   Yes [provider]  felodipine   (PLENDIL ) 10 MG 24 hr tablet Take 10 mg by mouth in the morning.   Yes [provider]  ferrous sulfate  325 (65 FE) MG tablet Take 325 mg by mouth 2 (two) times daily with a meal.   Yes [provider]  fluticasone -salmeterol (ADVAIR) 250-50 MCG/ACT AEPB Inhale one puff by mouth into the lungs in the morning and at bedtime. Patient taking differently: Inhale 1 puff into the lungs in the morning and at bedtime. 07/12/24  Yes Hunsucker, Donnice SAUNDERS, MD  HYDROcodone -acetaminophen  (NORCO/VICODIN) 5-325 MG tablet Take 1 tablet by mouth 2 (two) times daily as needed (for pain). 01/06/19  Yes [provider]  loratadine (CLARITIN) 10 MG tablet Take 10 mg by mouth every morning.    Yes [provider]  losartan  (COZAAR ) 100 MG tablet Take 100 mg by mouth daily. 07/27/17  Yes [provider]  MEGARED OMEGA-3 KRILL OIL PO Take 1 capsule by mouth daily.   Yes [provider]  methocarbamol  (ROBAXIN ) 500 MG tablet Take 500 mg by mouth at bedtime as needed for muscle spasms. 01/28/24  Yes [provider]  Multiple Vitamin (MULTIVITAMIN WITH MINERALS) TABS tablet Take 1 tablet by mouth daily with breakfast.   Yes [provider]  simvastatin  (ZOCOR ) 40 MG tablet Take 40 mg by mouth every evening.   Yes [provider]  spironolactone  (ALDACTONE ) 50 MG tablet Take 0.5 tablets (25 mg total) by mouth daily. 06/06/24  Yes Patwardhan, Newman PARAS, MD  SYSTANE 0.4-0.3 % SOLN Place 1 drop into both eyes 3 (three) times daily as needed (for dryness).   Yes [provider]  TYLENOL  325 MG tablet Take 325-650 mg by mouth every 6 (six) hours as needed for mild pain (pain score 1-3) or headache.   Yes [provider]  urea (CARMOL) 10 % cream Apply 1 application  topically as needed (for dryness).   Yes [provider]  vitamin B-12 (CYANOCOBALAMIN ) 500 MCG tablet Take 500 mcg by mouth daily.   Yes [provider]   felodipine  (PLENDIL ) 5 MG 24 hr tablet TAKE TWO TABLETS BY MOUTH DAILY Patient not taking: Reported on 07/14/2024 12/24/21   Ladona Heinz, MD  omeprazole (PRILOSEC) 20 MG capsule Take 20 mg by mouth daily.    [provider]  AISHA SINKS test strip USE TO TEST BID AS DIRECTED 03/20/16   [provider]     Critical care time: n/a       Lyle Pesa, MSN, AG-ACNP-BC [facility] 07/20/2024, 12:44 PM  See Amion for pager If no response to pager , please call [phone] until 7pm After 7:00 pm call Elink  [phone]

## 2024-07-21 ENCOUNTER — Encounter (HOSPITAL_COMMUNITY): Payer: Self-pay | Admitting: Oncology

## 2024-07-21 DIAGNOSIS — J9602 Acute respiratory failure with hypercapnia: Secondary | ICD-10-CM | POA: Diagnosis not present

## 2024-07-21 DIAGNOSIS — C169 Malignant neoplasm of stomach, unspecified: Secondary | ICD-10-CM | POA: Diagnosis not present

## 2024-07-21 DIAGNOSIS — I5032 Chronic diastolic (congestive) heart failure: Secondary | ICD-10-CM | POA: Diagnosis not present

## 2024-07-21 DIAGNOSIS — J9601 Acute respiratory failure with hypoxia: Secondary | ICD-10-CM | POA: Diagnosis not present

## 2024-07-21 DIAGNOSIS — I10 Essential (primary) hypertension: Secondary | ICD-10-CM | POA: Diagnosis not present

## 2024-07-21 LAB — BASIC METABOLIC PANEL WITH GFR
Anion gap: 11 (ref 5–15)
BUN: 17 mg/dL (ref 8–23)
CO2: 29 mmol/L (ref 22–32)
Calcium: 8.7 mg/dL — ABNORMAL LOW (ref 8.9–10.3)
Chloride: 95 mmol/L — ABNORMAL LOW (ref 98–111)
Creatinine, Ser: 1.13 mg/dL (ref 0.61–1.24)
GFR, Estimated: >60 mL/min (ref >60)
Glucose, Bld: 101 mg/dL — ABNORMAL HIGH (ref 70–99)
Potassium: 3.7 mmol/L (ref 3.5–5.1)
Sodium: 135 mmol/L (ref 135–145)

## 2024-07-21 LAB — CBC
HCT: 27.1 % — ABNORMAL LOW (ref 39.0–52.0)
Hemoglobin: 7.7 g/dL — ABNORMAL LOW (ref 13.0–17.0)
MCH: 22.3 pg — ABNORMAL LOW (ref 26.0–34.0)
MCHC: 28.4 g/dL — ABNORMAL LOW (ref 30.0–36.0)
MCV: 78.3 fL — ABNORMAL LOW (ref 80.0–100.0)
Platelets: 204 K/uL (ref 150–400)
RBC: 3.46 MIL/uL — ABNORMAL LOW (ref 4.22–5.81)
RDW: 20.6 % — ABNORMAL HIGH (ref 11.5–15.5)
WBC: 7.5 K/uL (ref 4.0–10.5)
nRBC: 0 % (ref 0.0–0.2)

## 2024-07-21 LAB — GLUCOSE, CAPILLARY
Glucose-Capillary: 98 mg/dL (ref 70–99)
Glucose-Capillary: 114 mg/dL — ABNORMAL HIGH (ref 70–99)
Glucose-Capillary: 121 mg/dL — ABNORMAL HIGH (ref 70–99)
Glucose-Capillary: 113 mg/dL — ABNORMAL HIGH (ref 70–99)

## 2024-07-21 MED ORDER — FUROSEMIDE 40 MG PO TABS
40.0000 mg | ORAL_TABLET | Freq: Every day | ORAL | Status: DC
  Administered 2024-07-22 – 2024-07-23 (×2): 40 mg via ORAL
  Filled 2024-07-21 (×2): qty 1

## 2024-07-21 MED ORDER — HYDROMORPHONE HCL 1 MG/ML IJ SOLN: 0.5000 mg | INTRAMUSCULAR | Status: DC | PRN

## 2024-07-21 MED ORDER — METHOCARBAMOL 500 MG PO TABS
500.0000 mg | ORAL_TABLET | Freq: Three times a day (TID) | ORAL | Status: DC | PRN
  Administered 2024-07-21: 500 mg via ORAL

## 2024-07-21 MED ORDER — NYSTATIN 100000 UNIT/ML MT SUSP
5.0000 mL | Freq: Four times a day (QID) | OROMUCOSAL | Status: DC
  Administered 2024-07-21 – 2024-07-27 (×22): 500000 [IU] via OROMUCOSAL
  Filled 2024-07-21 (×22): qty 5

## 2024-07-21 MED ORDER — FERROUS SULFATE 325 (65 FE) MG PO TABS
325.0000 mg | ORAL_TABLET | Freq: Two times a day (BID) | ORAL | Status: DC
  Administered 2024-07-22 – 2024-07-23 (×4): 325 mg via ORAL
  Filled 2024-07-21 (×4): qty 1

## 2024-07-21 NOTE — Plan of Care (Signed)
 Patient currently on heated high flow at 55%/20 liters, lungs decreased and clear,patient using incentive spirometer effectively, patient declines wearing hospital BiPAP, education provided, RT at bedside states patent can refuse to wear at night stated  It's his choice, patient has no complaints of shortness of breath at this time. Wife at bedside, plan of care and goals discussed with both patient and wife, patient handbook/guide at bedside, bed in lowest locked position with side rails up, call bell in hand and bed alarm on.  Problem: Education: Goal: Knowledge of General Education information will improve Description: Including pain rating scale, medication(s)/side effects and non-pharmacologic comfort measures Outcome: Progressing   Problem: Health Behavior/Discharge Planning: Goal: Ability to manage health-related needs will improve Outcome: Progressing   Problem: Clinical Measurements: Goal: Ability to maintain clinical measurements within normal limits will improve Outcome: Progressing Goal: Will remain free from infection Outcome: Progressing Goal: Diagnostic test results will improve Outcome: Progressing Goal: Respiratory complications will improve Outcome: Progressing Goal: Cardiovascular complication will be avoided Outcome: Progressing   Problem: Activity: Goal: Risk for activity intolerance will decrease Outcome: Progressing   Problem: Nutrition: Goal: Adequate nutrition will be maintained Outcome: Progressing   Problem: Coping: Goal: Level of anxiety will decrease Outcome: Progressing   Problem: Elimination: Goal: Will not experience complications related to bowel motility Outcome: Progressing Goal: Will not experience complications related to urinary retention Outcome: Progressing   Problem: Pain Managment: Goal: General experience of comfort will improve and/or be controlled Outcome: Progressing   Problem: Safety: Goal: Ability to remain free from  injury will improve Outcome: Progressing   Problem: Skin Integrity: Goal: Risk for impaired skin integrity will decrease Outcome: Progressing   Problem: Activity: Goal: Ability to tolerate increased activity will improve Outcome: Progressing   Problem: Clinical Measurements: Goal: Ability to maintain a body temperature in the normal range will improve Outcome: Progressing   Problem: Respiratory: Goal: Ability to maintain adequate ventilation will improve Outcome: Progressing Goal: Ability to maintain a clear airway will improve Outcome: Progressing   Problem: Education: Goal: Ability to describe self-care measures that may prevent or decrease complications (Diabetes Survival Skills Education) will improve Outcome: Progressing Goal: Individualized Educational Video(s) Outcome: Progressing   Problem: Coping: Goal: Ability to adjust to condition or change in health will improve Outcome: Progressing   Problem: Fluid Volume: Goal: Ability to maintain a balanced intake and output will improve Outcome: Progressing   Problem: Health Behavior/Discharge Planning: Goal: Ability to identify and utilize available resources and services will improve Outcome: Progressing Goal: Ability to manage health-related needs will improve Outcome: Progressing   Problem: Metabolic: Goal: Ability to maintain appropriate glucose levels will improve Outcome: Progressing   Problem: Nutritional: Goal: Maintenance of adequate nutrition will improve Outcome: Progressing Goal: Progress toward achieving an optimal weight will improve Outcome: Progressing   Problem: Skin Integrity: Goal: Risk for impaired skin integrity will decrease Outcome: Progressing   Problem: Tissue Perfusion: Goal: Adequacy of tissue perfusion will improve Outcome: Progressing

## 2024-07-21 NOTE — Progress Notes (Signed)
 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  Admitted for acute hypoxic and hypercapnic respiratory failure secondary to left lower lobe pneumonia, requiring BiPAP.  Condition stabilized rapidly but he still remains on high flow oxygen  with no opportunity for progression.  Pulmonology consulted for further recommendations.  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 Patient is currently on high flow oxygen  20 L, 55% successful weaning gradually noted. Continues to be on BiPAP at night. Repeat chest xray shows bibasilar atelectasis, left lower infiltrate. Finished 5 days of CAP treatment with Rocephin  and azithromycin . Continue Breo Ellipta , albuterol  as needed. Continue trial of Lasix . Pulmonology consult appreciated, needs aggressive pulm hygiene  Advised out of bed, encouraged incentive spirometry.  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 Transition IV Lasix  40mg  q12 to oral 40mg  daily. Monitor strict input output, daily weights. Continue Coreg , ARB, Aldactone , BiDil  therapy.  History of pulmonary embolism- Off Eliquis  due to GI bleeding.  Type 2 diabetes mellitus: A1c less than 6.  On sliding scale. Hypoglycemia protocol.  CKD stage III-stable. Avoid nephrotoxic drugs. Monitor daily renal function,  Hypertension: Continue Coreg , losartan , Aldactone .  Hyperlipidemia-on simvastatin .  OSA-CPAP at night.  Will use bipap at night while hospitalized.  Chronic anemia-Hb stable. Gastric adenocarcinoma- Recent diagnosis 07/01/24.   Outpatient oncology follow-up as scheduled for further management. Low iron noted from prior labs. Continue iron supplements.  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  Stable 4.8cm Need 6 month follow up imaging.  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       Out of bed to chair. Incentive spirometry. Nursing supportive care. Fall, aspiration precautions. Diet:  Diet Orders (From admission, onward)     Start     Ordered   07/15/24 1009  Diet regular Room service appropriate? Yes; Fluid consistency: Thin  Diet effective now       Question Answer Comment  Room service appropriate? Yes   Fluid consistency: Thin      07/15/24 1008           DVT prophylaxis: SCDs Start: 07/14/24 1857  Level of care: Stepdown   Code Status: Limited: Do not attempt resuscitation (DNR) -DNR-LIMITED -Do Not Intubate/DNI   Subjective: Patient is seen and examined today morning. He is sitting in chair. His O2 tapered down flow O2. Family at bedside. Patient working in Facilities manager. Had a bowel movement today.  Physical Exam: Vitals:   07/21/24 1000 07/21/24 1100 07/21/24 1114 07/21/24 1200  BP: (!) 101/45  (!) 117/46 (!) 121/49  Pulse: 68 66 67 67  Resp: (!) 24 (!) 21 (!) 23 (!) 24  Temp:      TempSrc:      SpO2: 90% (!) 89% 90% 91%  Weight:      Height:        General - Elderly obese African American male, mild respiratory distress HEENT - PERRLA, EOMI, atraumatic head, non tender sinuses. Lung - Clear, basal rales, rhonchi, wheezes. Heart - S1, S2 heard, no murmurs, rubs, 1+ pedal edema. Abdomen - Soft, non tender, obese, bowel sounds good Neuro -  Alert, awake and oriented x 3, non focal exam. Skin - Warm and dry.  Data Reviewed:      Latest Ref Rng & Units 07/21/2024    3:41 AM 07/20/2024    5:54 AM 07/18/2024    3:05 AM  CBC  WBC 4.0 - 10.5 K/uL 7.5  6.7  8.5   Hemoglobin 13.0 - 17.0 g/dL 7.7  8.1  8.2   Hematocrit 39.0 -  52.0 % 27.1  28.6  29.7   Platelets 150 - 400 K/uL 204  195  212       Latest Ref Rng & Units 07/21/2024    3:41 AM 07/20/2024    5:54 AM 07/18/2024    3:05 AM  BMP  Glucose 70 - 99 mg/dL 898  891  873   BUN 8 - 23 mg/dL 17  16  12    Creatinine 0.61 - 1.24 mg/dL 8.86  8.70  8.64   Sodium 135 - 145 mmol/L 135  133  136   Potassium 3.5 - 5.1 mmol/L 3.7  3.8  4.4   Chloride 98 - 111 mmol/L 95  94  101   CO2 22 - 32 mmol/L 29  29  25    Calcium 8.9 - 10.3 mg/dL 8.7  8.6  8.8    No results found.   Family Communication: Discussed with patient, family at bedside. They understand and agree. All questions answered.  Disposition: Status is: Inpatient Remains inpatient appropriate because: severity of illness  Planned Discharge Destination: Home with Home Health     Time spent: 51 minutes  Author: Concepcion Riser, MD 07/21/2024 12:42 PM Secure chat 7am to 7pm For on call review [email or web address].

## 2024-07-21 NOTE — Progress Notes (Signed)
 Pt would like to stay on HHFNC for the night. Pt respiratory status is stable on HHFNC at this time w/no distress noted. RT will continue to monitor while at [facility].   07/21/24 2146  Therapy Vitals  Pulse Rate 65  Resp 19  Patient Position (if appropriate) Lying  MEWS Score/Color  MEWS Score 0  MEWS Score Color Green  Respiratory Assessment  Assessment Type Assess only  Respiratory Pattern Regular;Unlabored;Dyspnea at rest;Symmetrical  Chest Assessment Chest expansion symmetrical  Cough None  Bilateral Breath Sounds Clear;Diminished  R Upper  Breath Sounds Clear  L Upper Breath Sounds Clear  R Lower Breath Sounds Clear;Diminished  L Lower Breath Sounds Clear;Diminished  Oxygen  Therapy/Pulse Ox  O2 Device HHFNC  Heated High Flow Nasal Cannula  Yes  $ Adult Large Yes  O2 Therapy Oxygen  humidified  Heater temperature 93.2 F (34 C)  O2 Flow Rate (L/min) 20 L/min  FiO2 (%) 55 %  SpO2 94 %  Equipment wiped down Yes

## 2024-07-21 NOTE — Plan of Care (Signed)
  Problem: Health Behavior/Discharge Planning: Goal: Ability to manage health-related needs will improve Outcome: Progressing   Problem: Clinical Measurements: Goal: Ability to maintain clinical measurements within normal limits will improve Outcome: Progressing Goal: Respiratory complications will improve Outcome: Progressing   Problem: Activity: Goal: Risk for activity intolerance will decrease Outcome: Progressing   Problem: Nutrition: Goal: Adequate nutrition will be maintained Outcome: Progressing   Problem: Elimination: Goal: Will not experience complications related to bowel motility Outcome: Progressing   Problem: Safety: Goal: Ability to remain free from injury will improve Outcome: Progressing   Problem: Activity: Goal: Ability to tolerate increased activity will improve Outcome: Progressing

## 2024-07-21 NOTE — Consult Note (Addendum)
 [facility] CONSULT NOTE  Patient Care Team: Bradley Rush, MD as PCP - General (Internal Medicine) Bradley Newman PARAS, MD as PCP - Cardiology (Cardiology) Bradley Sari SQUIBB, RN as Oncology Nurse Navigator  CHIEF COMPLAINTS/PURPOSE OF CONSULTATION:  Gastric cancer, newly diagnosed  REFERRING PHYSICIAN: [name]  HISTORY OF PRESENTING ILLNESS:  [facility] 83 y.o. male who was admitted on 07/14/2024 with complaints of shortness of breath.  Patient had recently been diagnosed with gastric cancer following EGD on 07/01/2024.  Therefore oncology has been asked to evaluate and manage oncologic diagnoses. Patient is seen awake alert and oriented x 3 sitting up in chair at bedside with O2 via HFNC.  He is a very pleasant gentleman who along with his spouse at bedside serves as a principal historian. Patient uses CPAP at home.  Reports having indigestion and chest pain at home a few weeks ago and went to PCP for evaluation. Subsequently went to ED where hemoglobin was found to be low.  Work-up done which showed gastric cancer.  Medical history significant for recently diagnosed gastric adenocarcinoma, history of PE, off Eliquis  due to GI bleed, AAA, iron deficiency anemia, hypertension, hyperlipidemia, CKD, and DM.   Surgical history includes prostate surgery, bilateral knee replacements, left ankle surgery with screws intact.  Family history includes a son with lung cancer, and two brothers with prostate and throat cancer. Social history significant for tobacco use about 1 PPD, started age 43 and quit in the 32s. Admits to alcohol  use to sleep.  Denies illicit drug use.  Retired, worked at a [employer], Building surveyor for furniture.      I have reviewed his chart and materials related to his cancer extensively and collaborated history with the patient. Summary of oncologic history is as follows: Oncology History   No history exists.    ASSESSMENT & PLAN:   Gastric adenocarcinoma -Invasive moderately differentiated, MS1 high -Newly diagnosed 07/01/2024 - GI/Dr. Rollin has been following. - CT imaging done 07/06/2024 showed retroperitoneal, periaortic, celiac lymph nodes concerning for metastatic nodal disease.  Pulmonary nodule indeterminate, mets cannot be excluded. - Plan for outpatient PET scan upon discharge for further staging - Immunotherapy plans in progress - Medical oncology/Dr. Cloretta following closely  Pneumonia, LLL - CT angio done 07/14/2024 showed left lower lobe consolidation suspicious for pneumonia - Continue antibiotics as ordered - Continue respiratory and supportive care - Monitor fever curve  Anemia, microcytic Iron deficiency anemia - Hemoglobin low 7.7 today - Recommend PRBC transfusion for Hgb <7.0 - No transfusional intervention required today - Ferritin 39 on 8/12, previously on iron supplements - Continue to monitor CBC with differential  Hypertension Hyperlipidemia Diabetes CKD -On antihypertensives - On statin - Avoid nephrotoxic agents - Monitor CMP closely  History of PE - Was on Eliquis , currently off due to GI bleeding    MEDICAL HISTORY:  Past Medical History:  Diagnosis Date   AAA (abdominal aortic aneurysm) without rupture (HCC)    followed by dr brain--  per office note dated 11-05-2018  3.5cm   Benign essential HTN    cardiologist-- dr tally   Chronic combined systolic (congestive) and diastolic (congestive) heart failure [facility])    cardiologist-- dr katharyn   CKD (chronic kidney disease), stage III (HCC)    Diabetes mellitus type 2, insulin  dependent (HCC)    followed by dr Bradley   Diabetic neuropathy (HCC)    DOE (dyspnea on exertion)    Edema of both lower  extremities    Full dentures    HOH (hard of hearing)    does wear hearing aids   Hydrocele, bilateral    Lichen planus    Mixed hyperlipidemia    Morbid obesity with BMI of 45.0-49.9, adult (HCC)    Nocturia     OA (osteoarthritis)    OSA on CPAP 10/14/08   per study  AHI  61.8/hr ,  RDI  96.1/hr.     SURGICAL HISTORY: Past Surgical History:  Procedure Laterality Date   CARDIAC CATHETERIZATION  per pt yrs ago   was told no blockage   CARDIOVASCULAR STRESS TEST  07-19-2014   dr debby sor   Low risk nuclear study w/ small apical attentuation artifact/  normal LV function and wall motion , ef 53%   CATARACT EXTRACTION W/ INTRAOCULAR LENS  IMPLANT, BILATERAL  2018   COLONOSCOPY W/ BIOPSIES AND POLYPECTOMY     COLONOSCOPY WITH PROPOFOL  N/A 05/16/2016   Procedure: COLONOSCOPY WITH PROPOFOL ;  Surgeon: [name], MD;  Location: [facility];  Service: Endoscopy;  Laterality: N/A;   ESOPHAGOGASTRODUODENOSCOPY N/A 07/01/2024   Procedure: EGD (ESOPHAGOGASTRODUODENOSCOPY);  Surgeon: Just Belvie, MD;  Location: [facility];  Service: Gastroenterology;  Laterality: N/A;   HYDROCELE EXCISION Bilateral 11/15/2018   Procedure: HYDROCELECTOMY ADULT;  Surgeon: [name], Mark, MD;  Location: [facility];  Service: Urology;  Laterality: Bilateral;   KNEE ARTHROSCOPY Right 2009   MULTIPLE TOOTH EXTRACTIONS     ORIF ANKLE FRACTURE Left 09/13/2014   Procedure: Open Reduction Internal Fixation Left Ankle Medial Malleolus;  Surgeon: [name], MD;  Location: [facility] OR;  Service: Orthopedics;  Laterality: Left;   PROSTATECTOMY  1993 approx.  by dr matilda   for prostate enlargement   TOTAL KNEE ARTHROPLASTY Right 08/27/2009   dr jane @[facility]    TOTAL KNEE ARTHROPLASTY  12/20/2012   Procedure: TOTAL KNEE ARTHROPLASTY;  Surgeon: [name], MD;  Location: [facility];  Service: Orthopedics;  Laterality: Left;  left total knee arthroplasty   TRANSTHORACIC ECHOCARDIOGRAM  11-04-2018   dr patwardhan   moderate to severe concentric LVH, ef 56%,  grade 1 diastolic dysfunciton/  mild AV calcification annulus and leaflets without stentosis ,  mild regurg./  moderate LAE/ dilated aortic root, 4.5cm/  mild MV  annulus calcification with trace regurg./  trace TR    SOCIAL HISTORY: Social History   Socioeconomic History   Marital status: Married    Spouse name: Not on file   Number of children: 5   Years of education: Not on file   Highest education level: Not on file  Occupational History   Not on file  Tobacco Use   Smoking status: Former    Current packs/day: 0.00    Average packs/day: 1.5 packs/day for 10.0 years (15.0 ttl pk-yrs)    Types: Cigarettes    Start date: 12/13/1961    Quit date: 12/14/1971    Years since quitting: 52.6   Smokeless tobacco: Former    Types: Snuff, Chew  Vaping Use   Vaping status: Never Used  Substance and Sexual Activity   Alcohol  use: Yes    Alcohol /week: 1.0 standard drink of alcohol     Types: 1 Standard drinks or equivalent per week    Comment: one shot qhs   Drug use: No   Sexual activity: Not on file  Other Topics Concern   Not on file  Social History Narrative   Not on file   Social Drivers of Health  Financial Resource Strain: Low Risk  (07/12/2024)   Overall Financial Resource Strain (CARDIA)    Difficulty of Paying Living Expenses: Not very hard  Food Insecurity: No Food Insecurity (07/14/2024)   Hunger Vital Sign    Worried About Running Out of Food in the Last Year: Never true    Ran Out of Food in the Last Year: Never true  Transportation Needs: No Transportation Needs (07/14/2024)   PRAPARE - Administrator, Civil Service (Medical): No    Lack of Transportation (Non-Medical): No  Physical Activity: Inactive (07/12/2024)   Exercise Vital Sign    Days of Exercise per Week: 0 days    Minutes of Exercise per Session: 0 min  Stress: No Stress Concern Present (07/12/2024)   [employer] of Occupational Health - Occupational Stress Questionnaire    Feeling of Stress: Not at all  Social Connections: Socially Integrated (07/14/2024)   Social Connection and Isolation Panel    Frequency of Communication with Friends and  Family: Three times a week    Frequency of Social Gatherings with Friends and Family: Three times a week    Attends Religious Services: 1 to 4 times per year    Active Member of Clubs or Organizations: Yes    Attends Banker Meetings: 1 to 4 times per year    Marital Status: Married  Catering manager Violence: Not At Risk (07/14/2024)   Humiliation, Afraid, Rape, and Kick questionnaire    Fear of Current or Ex-Partner: No    Emotionally Abused: No    Physically Abused: No    Sexually Abused: No    FAMILY HISTORY: Family History  Problem Relation Age of Onset   Hypertension Mother    Diabetes Mellitus II Mother    Heart attack Father    Diabetes Mellitus II Sister    Heart disease Sister    Diabetes Mellitus II Brother    Stroke Brother    Diabetes Mellitus II Brother    Kidney disease Brother    Diabetes Mellitus II Brother    Diabetes Mellitus II Sister    Heart disease Sister    Hypertension Other    Diabetes Mellitus II Other    Stroke Other    Heart disease Other      PHYSICAL EXAMINATION: ECOG PERFORMANCE STATUS: 2 - Symptomatic, <50% confined to bed  Vitals:   07/21/24 1100 07/21/24 1114  BP:  (!) 117/46  Pulse: 66 67  Resp: (!) 21 (!) 23  Temp:    SpO2: (!) 89% 90%   Filed Weights   07/19/24 1200 07/20/24 0704 07/21/24 0500  Weight: 298 lb 15.1 oz (135.6 kg) (!) 301 lb 13 oz (136.9 kg) 298 lb 15.1 oz (135.6 kg)    GENERAL: alert, no distress and comfortable SKIN: skin color, texture, turgor are normal, no rashes or significant lesions EYES: normal, conjunctiva are pink and non-injected, sclera clear OROPHARYNX: no exudate, no erythema and lips, buccal mucosa, and tongue normal  NECK: supple, thyroid  normal size, non-tender, without nodularity LYMPH: no palpable lymphadenopathy in the cervical, axillary or inguinal LUNGS: +diminished to auscultation +O2 via HFNC ongoing  HEART: regular rate & rhythm and no murmurs and no lower extremity  edema ABDOMEN: abdomen soft, non-tender and normal bowel sounds MUSCULOSKELETAL: no cyanosis of digits and no clubbing  PSYCH: alert & oriented x 3 with fluent speech NEURO: no focal motor/sensory deficits   ALLERGIES:  has no known allergies.  MEDICATIONS:  Current Facility-Administered Medications  Medication Dose Route Frequency Provider Last Rate Last Admin   acetaminophen  (TYLENOL ) tablet 650 mg  650 mg Oral Q6H PRN Patel, Vishal R, MD   650 mg at 07/16/24 1830   Or   acetaminophen  (TYLENOL ) suppository 650 mg  650 mg Rectal Q6H PRN Patel, Vishal R, MD       albuterol  (PROVENTIL ) (2.5 MG/3ML) 0.083% nebulizer solution 2.5 mg  2.5 mg Nebulization Q6H PRN Patel, Vishal R, MD       artificial tears ophthalmic solution 1 drop  1 drop Both Eyes TID PRN Jadine Toribio SQUIBB, MD       carvedilol  (COREG ) tablet 25 mg  25 mg Oral BID WC Patel, Vishal R, MD   25 mg at 07/21/24 0740   Chlorhexidine  Gluconate Cloth 2 % PADS 6 each  6 each Topical Q2200 Jadine Toribio SQUIBB, MD   6 each at 07/20/24 2130   felodipine  (PLENDIL ) 24 hr tablet 10 mg  10 mg Oral q AM Jadine Toribio SQUIBB, MD   10 mg at 07/21/24 9260   fluticasone  furoate-vilanterol (BREO ELLIPTA ) 200-25 MCG/ACT 1 puff  1 puff Inhalation Daily Tobie Jorie SAUNDERS, MD   1 puff at 07/21/24 0741   guaiFENesin  (MUCINEX ) 12 hr tablet 600 mg  600 mg Oral BID Patel, Vishal R, MD   600 mg at 07/21/24 9082   hydrALAZINE  (APRESOLINE ) injection 10 mg  10 mg Intravenous Q4H PRN Patel, Vishal R, MD   10 mg at 07/18/24 9383   HYDROcodone -acetaminophen  (NORCO/VICODIN) 5-325 MG per tablet 1 tablet  1 tablet Oral BID PRN Nicholaus Quarry, [facility]       HYDROmorphone  (DILAUDID ) injection 0.5 mg  0.5 mg Intravenous Q4H PRN Darci Pore, MD       insulin  aspart (novoLOG ) injection 0-9 Units  0-9 Units Subcutaneous TID WC Patel, Vishal R, MD   2 Units at 07/20/24 1200   isosorbide -hydrALAZINE  (BIDIL ) 20-37.5 MG per tablet 2 tablet  2 tablet Oral TID Patel, Vishal R,  MD   2 tablet at 07/21/24 0917   lidocaine  (LIDODERM ) 5 % 1 patch  1 patch Transdermal Q24H Jadine Toribio SQUIBB, MD   1 patch at 07/21/24 9082   methocarbamol  (ROBAXIN ) tablet 500 mg  500 mg Oral Q8H PRN Sreeram, Narendranath, MD   500 mg at 07/21/24 9082   nystatin  (MYCOSTATIN ) 100000 UNIT/ML suspension 500,000 Units  5 mL Mouth/Throat QID Darci Pore, MD   500,000 Units at 07/21/24 9078   ondansetron  (ZOFRAN ) tablet 4 mg  4 mg Oral Q6H PRN Patel, Vishal R, MD       Or   ondansetron  (ZOFRAN ) injection 4 mg  4 mg Intravenous Q6H PRN Patel, Vishal R, MD       Oral care mouth rinse  15 mL Mouth Rinse PRN Jadine Toribio SQUIBB, MD       pantoprazole  (PROTONIX ) EC tablet 40 mg  40 mg Oral Daily Patel, Vishal R, MD   40 mg at 07/21/24 9082   polyethylene glycol (MIRALAX  / GLYCOLAX ) packet 17 g  17 g Oral BID Jadine Toribio SQUIBB, MD   17 g at 07/21/24 9082   senna (SENOKOT) tablet 8.6 mg  1 tablet Oral QHS Jadine Toribio SQUIBB, MD   8.6 mg at 07/20/24 2042   simvastatin  (ZOCOR ) tablet 40 mg  40 mg Oral QPM Patel, Vishal R, MD   40 mg at 07/20/24 1714   sodium chloride  flush (NS) 0.9 % injection 3 mL  3 mL Intravenous Q12H Tobie,  Jorie SAUNDERS, MD   3 mL at 07/21/24 9081   spironolactone  (ALDACTONE ) tablet 25 mg  25 mg Oral Daily Jadine Toribio SQUIBB, MD   25 mg at 07/21/24 9082     LABORATORY DATA:  I have reviewed the data as listed Lab Results  Component Value Date   WBC 7.5 07/21/2024   HGB 7.7 (L) 07/21/2024   HCT 27.1 (L) 07/21/2024   MCV 78.3 (L) 07/21/2024   PLT 204 07/21/2024   Recent Labs    05/31/24 2129 07/06/24 1021 07/12/24 1511 07/14/24 1344 07/18/24 0305 07/20/24 0554 07/21/24 0341  NA  --   --  138   < > 136 133* 135  K  --   --  4.1   < > 4.4 3.8 3.7  CL  --   --  103   < > 101 94* 95*  CO2  --   --  25   < > 25 29 29   GLUCOSE  --   --  111*   < > 126* 108* 101*  BUN  --   --  8   < > 12 16 17   CREATININE  --    < > 1.48*   < > 1.35* 1.29* 1.13  CALCIUM  --   --  8.9    < > 8.8* 8.6* 8.7*  GFRNONAA  --   --  47*   < > 52* 55* >60  PROT 7.1  --  6.6  --   --   --   --   ALBUMIN  3.8  --  3.5  --   --   --   --   AST 24  --  21  --   --   --   --   ALT 14  --  12  --   --   --   --   ALKPHOS 56  --  53  --   --   --   --   BILITOT 0.3  --  0.3  --   --   --   --   BILIDIR 0.1  --   --   --   --   --   --   IBILI 0.1*  --   --   --   --   --   --    < > = values in this interval not displayed.    RADIOGRAPHIC STUDIES: I have personally reviewed the radiological images as listed and agreed with the findings in the report. DG CHEST PORT 1 VIEW Result Date: 07/19/2024 CLINICAL DATA:  Lobar pneumonia follow-up. EXAM: PORTABLE CHEST 1 VIEW COMPARISON:  Chest radiograph dated 07/14/2024. FINDINGS: There is mild eventration of the right hemidiaphragm. There is shallow inspiration with bibasilar atelectasis. An infiltrative process at the left lung base is not excluded. No pleural effusion or pneumothorax. Mild cardiomegaly. No acute osseous pathology. IMPRESSION: Shallow inspiration with bibasilar atelectasis. An infiltrative process at the left lung base is not excluded. Electronically Signed   By: Vanetta Chou M.D.   On: 07/19/2024 14:13   CT Angio Chest PE W/Cm &/Or Wo Cm Result Date: 07/14/2024 CLINICAL DATA:  Increased shortness of breath and hypoxia associated with tachypnea EXAM: CT ANGIOGRAPHY CHEST WITH CONTRAST TECHNIQUE: Multidetector CT imaging of the chest was performed using the standard protocol during bolus administration of intravenous contrast. Multiplanar CT image reconstructions and MIPs were obtained to evaluate the vascular anatomy. RADIATION DOSE REDUCTION: This exam was  performed according to the departmental dose-optimization program which includes automated exposure control, adjustment of the mA and/or kV according to patient size and/or use of iterative reconstruction technique. CONTRAST:  OMNIPAQUE  IOHEXOL  350 MG/ML SOLN COMPARISON:   CT chest dated 07/06/2024 FINDINGS: Cardiovascular: The study is adequate for the evaluation of pulmonary embolism the level of proximal segmental pulmonary arteries due to motion artifact and timing of contrast bolus. There are no filling defects in the central, lobar, or proximal segmental pulmonary artery branches to suggest acute pulmonary embolism. Main pulmonary artery measures 4.5 cm. Ascending thoracic aorta measures 4.8 x 4.7 cm. Mild multichamber cardiomegaly. No significant pericardial fluid/thickening. Coronary artery calcifications and aortic atherosclerosis. Mediastinum/Nodes: Imaged thyroid  gland without nodules meeting criteria for imaging follow-up by size. Normal esophagus. No substantial change in multi station lymphadenopathy, for example 14 mm right low paratracheal (7:48), 12 mm AP window (7:67), and 13 mm right hilar (7:59). Lungs/Pleura: The central airways are patent. Unchanged asymmetric elevation of the right hemidiaphragm. Increased bibasilar subsegmental atelectasis. New peripheral left lower lobe consolidation (9:81). Mild interlobular septal thickening with scattered peribronchovascular ground-glass opacities. No pneumothorax. No pleural effusion. Upper abdomen: Cholelithiasis. Musculoskeletal: No acute or abnormal lytic or blastic osseous lesions. Multilevel degenerative changes of the thoracic spine. Review of the MIP images confirms the above findings. IMPRESSION: 1. No evidence of acute pulmonary embolism to the level of the proximal segmental pulmonary arteries. 2. New peripheral left lower lobe consolidation, suspicious for pneumonia. 3. Mild interlobular septal thickening with scattered peribronchovascular ground-glass opacities, which may represent pulmonary edema. 4. Dilated main pulmonary artery measuring 4.5 cm can be seen in the setting of pulmonary arterial hypertension. 5. Unchanged mediastinal and right hilar lymphadenopathy, likely reactive. 6. Ascending thoracic aortic  aneurysm measures 4.8 cm. Ascending thoracic aortic aneurysm. Recommend semi-annual imaging followup by CTA or MRA and referral to cardiothoracic surgery if not already obtained. This recommendation follows 2010 ACCF/AHA/AATS/ACR/ASA/SCA/SCAI/SIR/STS/SVM Guidelines for the Diagnosis and Management of Patients With Thoracic Aortic Disease. Circulation. 2010; 121: [id number]. Aortic aneurysm NOS (ICD10-I71.9) 7.  Aortic Atherosclerosis (ICD10-I70.0). Electronically Signed   By: Limin  Xu M.D.   On: 07/14/2024 15:35   DG Chest Port 1 View Result Date: 07/14/2024 CLINICAL DATA:  Shortness of breath, hypoxia. EXAM: PORTABLE CHEST 1 VIEW COMPARISON:  May 31, 2024. FINDINGS: Stable cardiomediastinal silhouette. Hypoinflation of the lungs with minimal bibasilar subsegmental atelectasis. Bony thorax is unremarkable. IMPRESSION: Hypoinflation of the lungs with minimal bibasilar subsegmental atelectasis. Electronically Signed   By: Lynwood Landy Raddle M.D.   On: 07/14/2024 14:06   CT CHEST ABDOMEN PELVIS W CONTRAST Result Date: 07/06/2024 CLINICAL DATA:  New diagnosis of stomach cancer EXAM: CT CHEST, ABDOMEN, AND PELVIS WITH CONTRAST TECHNIQUE: Multidetector CT imaging of the chest, abdomen and pelvis was performed following the standard protocol during bolus administration of intravenous contrast. RADIATION DOSE REDUCTION: This exam was performed according to the departmental dose-optimization program which includes automated exposure control, adjustment of the mA and/or kV according to patient size and/or use of iterative reconstruction technique. CONTRAST:  80mL OMNIPAQUE  IOHEXOL  300 MG/ML  SOLN COMPARISON:  Chest CT March 03, 2024 FINDINGS: CT CHEST FINDINGS Cardiovascular: The heart size is borderline normal. Enlarged pulmonary artery measuring 44 mm. Aneurysmal dilation of ascending aorta measures 4.9 cm. No pericardial fluid. Mediastinum/Nodes: Multiple prominent subcentimeter and pericentimeter mediastinal lymph nodes.  Index node in right lower paratracheal measures 1.4 cm. Lungs/Pleura: Right upper lobe pulmonary nodule measuring 5 mm (7/34), new to prior. No emphysematous changes,  air space consolidation or honeycombing. No pleural effusion. Musculoskeletal: Unremarkable CT ABDOMEN PELVIS FINDINGS Hepatobiliary: Hepatic steatosis. Cholelithiasis without significant biliary dilatation. Normal gallbladder wall thickness. Pancreas: Unremarkable. No pancreatic ductal dilatation or surrounding inflammatory changes. Spleen: Normal in size without focal abnormality. Adrenals/Urinary Tract: Right adrenal small nodule measuring 8 mm. Left adrenal is normal. Bilateral simple renal cortical cysts which does not require imaging follow-up. Kidneys are otherwise normal, without renal calculi, or hydronephrosis. Bladder is unremarkable. Stomach/Bowel: There is an enhancing segment wall thickening/mass along the lesser curvature of the stomach measuring about 7.3 x 3.2 cm consistent with known malignancy. Bowel loops are unremarkable Vascular/Lymphatic: Mesenteric root fat stranding with multiple para-aortic aortocaval and pericaval prominent subcentimeter and pericentimeter lymph nodes. Celiac lymph node measuring 1.4 cm (2/56). Additional subcentimeter multiple mesenteric and celiac node lymph nodes index node in aortocaval measures 9 mm (2/79 (2/79, 73 Reproductive: Nodular heterogeneous enlarged prostate. Other: Bilateral fat containing inguinal hernias. Small fat containing umbilical hernia. Musculoskeletal: Fat containing intramuscular lipoma along the left iliacus muscle measuring 2.7 cm. No suspicious osseous lesion. IMPRESSION: Enhancing lesser curvature wall thickening/mass likely correlating to known gastric malignancy. Mesenteric root fat stranding and multiple subcentimeter and pericentimeter retroperitoneal/para-aortic and celiac lymph nodes concerning for metastatic nodal disease. New upper lobe pulmonary nodule, indeterminate.  Metastasis cannot be excluded. Follow-up according to oncology protocols as Fleischner guidelines cannot be utilized and a it is in this patient with known malignancy. Stable ascending aorta aneurysm. Enlarged pulmonary artery which can be seen the setting of pulmonary artery hypertension. Cholelithiasis. Right adrenal small nodule, indeterminate. Metastasis cannot be excluded. Electronically Signed   By: Megan  Zare M.D.   On: 07/06/2024 13:20     The total time spent in the appointment was 55 minutes encounter with patients including review of chart and various tests results, discussions about plan of care and coordination of care plan   All questions were answered. The patient knows to call the clinic with any problems, questions or concerns. No barriers to learning was detected.  Olam JINNY Brunner, NP 8/21/202512:04 PM Mr. Chapa was interviewed and examined.  I reviewed the admission CT images.  He was admitted 07/14/2024 with respiratory failure secondary to pneumonia.  His clinical status has improved, but he remains on high flow oxygen .  He was recently diagnosed with gastric cancer.  He was scheduled for PET scan and outpatient [facility]-up at [facility].  The gastric tumor has loss of MLH1 and PMS2 expression assistant with an MSI high phenotype.  Mr. Wah does not appear to be a candidate for gastrectomy.  He has a high chance of responding to immunotherapy.  I we will recommend treatment with ipilimumab/nivolumab or single agent pembrolizumab depending on his recovery from the current hospital admission.  I reviewed the pathology results with Mr. Hettich and his wife.  We discussed the treatment plan.  Gastric cancer Upper endoscopy 07/01/2024-large fungating, ulcerated, noncircumferential mass on the lesser curvature of the stomach.  Pathology-invasive moderately differentiated adenocarcinoma; HER2 negative, loss of MLH1 and PMS2, Claudin 18-negative, PD-L1 CPS-50, MLH1  methylation detected,BRAF-negative CTs 07/06/2024-enhancing lesser curvature wall thickening/mass; mesenteric root fat stranding and multiple retroperitoneal/periaortic and celiac lymph nodes; new right upper lobe upper lobe pulmonary nodule; multiple prominent mediastinal lymph nodes. Iron deficiency anemia  CAD/CHF Diabetes Hypertension CKD Obstructive sleep apnea History of pulmonary embolus August 2022-previously on apixaban .  Apixaban  stopped due to bleeding. Ascending aortic aneurysm Admission 07/14/2024 with pneumonia and respiratory failure   Recommendations: 1.  Continue management of respiratory failure and pneumonia per the medical and pulmonary services 2.  Oral iron 3.  Transfuse packed red blood cells as needed for symptomatic anemia 4.  Outpatient staging PET scan 5.  Outpatient follow-up [facility]

## 2024-07-21 NOTE — Plan of Care (Incomplete)
 Patient currently on heated high flow at 55%/20 liters, lungs decreased and clear,patient using insentive

## 2024-07-22 DIAGNOSIS — I10 Essential (primary) hypertension: Secondary | ICD-10-CM | POA: Diagnosis not present

## 2024-07-22 DIAGNOSIS — J9601 Acute respiratory failure with hypoxia: Secondary | ICD-10-CM | POA: Diagnosis not present

## 2024-07-22 DIAGNOSIS — I5033 Acute on chronic diastolic (congestive) heart failure: Secondary | ICD-10-CM | POA: Diagnosis not present

## 2024-07-22 DIAGNOSIS — D500 Iron deficiency anemia secondary to blood loss (chronic): Secondary | ICD-10-CM | POA: Insufficient documentation

## 2024-07-22 DIAGNOSIS — N1831 Chronic kidney disease, stage 3a: Secondary | ICD-10-CM | POA: Insufficient documentation

## 2024-07-22 LAB — GLUCOSE, CAPILLARY
Glucose-Capillary: 106 mg/dL — ABNORMAL HIGH (ref 70–99)
Glucose-Capillary: 144 mg/dL — ABNORMAL HIGH (ref 70–99)
Glucose-Capillary: 118 mg/dL — ABNORMAL HIGH (ref 70–99)
Glucose-Capillary: 155 mg/dL — ABNORMAL HIGH (ref 70–99)

## 2024-07-22 LAB — CBC
HCT: 27.1 % — ABNORMAL LOW (ref 39.0–52.0)
Hemoglobin: 7.7 g/dL — ABNORMAL LOW (ref 13.0–17.0)
MCH: 22.1 pg — ABNORMAL LOW (ref 26.0–34.0)
MCHC: 28.4 g/dL — ABNORMAL LOW (ref 30.0–36.0)
MCV: 77.7 fL — ABNORMAL LOW (ref 80.0–100.0)
Platelets: 218 K/uL (ref 150–400)
RBC: 3.49 MIL/uL — ABNORMAL LOW (ref 4.22–5.81)
RDW: 20.4 % — ABNORMAL HIGH (ref 11.5–15.5)
WBC: 7.4 K/uL (ref 4.0–10.5)
nRBC: 0 % (ref 0.0–0.2)

## 2024-07-22 LAB — BASIC METABOLIC PANEL WITH GFR
Anion gap: 11 (ref 5–15)
BUN: 14 mg/dL (ref 8–23)
CO2: 28 mmol/L (ref 22–32)
Calcium: 8.4 mg/dL — ABNORMAL LOW (ref 8.9–10.3)
Chloride: 92 mmol/L — ABNORMAL LOW (ref 98–111)
Creatinine, Ser: 1.05 mg/dL (ref 0.61–1.24)
GFR, Estimated: >60 mL/min (ref >60)
Glucose, Bld: 101 mg/dL — ABNORMAL HIGH (ref 70–99)
Potassium: 3.6 mmol/L (ref 3.5–5.1)
Sodium: 131 mmol/L — ABNORMAL LOW (ref 135–145)

## 2024-07-22 MED ORDER — LISINOPRIL 10 MG PO TABS: 20.0000 mg | ORAL_TABLET | Freq: Every day | ORAL | Status: DC

## 2024-07-22 MED ORDER — LOSARTAN POTASSIUM 50 MG PO TABS
100.0000 mg | ORAL_TABLET | Freq: Every day | ORAL | Status: DC
  Administered 2024-07-22 – 2024-07-23 (×2): 100 mg via ORAL
  Filled 2024-07-22 (×2): qty 2

## 2024-07-22 MED ORDER — INSULIN ASPART 100 UNIT/ML IJ SOLN
0.0000 [IU] | Freq: Three times a day (TID) | INTRAMUSCULAR | Status: DC
  Administered 2024-07-23 – 2024-07-24 (×3): 1 [IU] via SUBCUTANEOUS

## 2024-07-22 NOTE — Assessment & Plan Note (Signed)
 Secondary to presumed slow gastrointestinal bleeding from gastric malignancy Obtaining iron panel, vitamin B12, folate, ferritin Monitoring hemoglobin and hematocrit with serial CBCs Will transfuse if hemoglobin drops below 7.

## 2024-07-22 NOTE — Assessment & Plan Note (Signed)
 CPAP nightly

## 2024-07-22 NOTE — Assessment & Plan Note (Signed)
 Continue simvastatin .

## 2024-07-22 NOTE — Progress Notes (Signed)
 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   Upon evaluation in the emergency department patient was noted to be suffering from acute hypoxic and hypercapnic respiratory failure secondary to left lower lobe pneumonia.  Requiring BiPAP.  Hospitalist was called to assess the patient for admission to hospital and was admitted to the stepdown unit.    Patient was placed on intravenous antibiotics with ceftriaxone  and azithromycin .  Blood cultures were obtained and were found to be unremarkable.  In the days that followed patient was weaned off BiPAP and transitioned to high flow nasal cannula.   Assessment & Plan Acute respiratory failure with hypoxia and hypercapnia (HCC) Community-acquired pneumonia secondary to left lower lobe pneumonia in the setting of obesity hypoventilation syndrome with superimposed acute on chronic diastolic congestive heart failure. weaning supplemental oxygen  as tolerated, attempting to transition off of high flow oxygen  back down to patient's baseline oxygen  requirement of 2 to 3 L via nasal cannula. Target oxygen  saturation to be 89 to 92% 5 days of intravenous antibiotics completed with Rocephin  and azithromycin  for community-acquired pneumonia Acute on chronic diastolic CHF (congestive heart failure) (HCC) Lasix  has been transitioned to oral  therapy Clinically nearing euvolemia Remainder of assessment and plan as above Iron deficiency anemia due to chronic blood loss Secondary to presumed slow gastrointestinal bleeding from gastric malignancy Obtaining iron panel, vitamin B12, folate, ferritin Monitoring hemoglobin and hematocrit with serial CBCs Will transfuse if hemoglobin drops below 7. Essential hypertension Blood pressure is above target Resuming home regimen of Cozaar  Continue Coreg  and Aldactone  Type 2 diabetes mellitus (HCC) Patient been placed on Accu-Cheks before every meal and nightly with sliding scale insulin  Holding home regimen of hypoglycemics Hemoglobin A1C ordered Diabetic Diet  Hypercholesteremia Continue simvastatin  OSA (obstructive sleep apnea) CPAP nightly History of pulmonary embolism Diagnosed 2022 Now off Eliquis  due to GI bleeding Gastric adenocarcinoma (HCC) Dr. Cloretta with oncology following, his input is appreciated Newly diagnosed 8/1 CT being done 8/6 revealing concerning findings for metastatic nodal disease Close outpatient follow-up for rapid evaluation and treatment Thoracic aortic aneurysm without rupture [facility]) Outpatient follow-up with Dr. Lucas, stable at 4.8 cm Chronic kidney disease, stage 3a (HCC) Strict intake and output monitoring Creatinine near baseline Minimizing nephrotoxic agents as much as possible Serial chemistries to monitor renal function and electrolytes    Subjective:  Patient denies shortness of breath or chest pain.  Appetite is improving.  Physical Exam:  Vitals:   07/22/24 1500 07/22/24 1600 07/22/24 1700 07/22/24 1800  BP: (!) 145/75 136/69 129/63 136/62  Pulse: 84 (!) 55 60 (!) 58  Resp: 19 (!) 25 (!) 22 (!) 26  Temp:  98.3 F (36.8 C)    TempSrc:  Oral    SpO2: 95% 96% 92% 92%  Weight:      Height:        Constitutional: Awake alert and oriented x3, no associated  distress.   Skin: no rashes, no lesions, good skin turgor  noted. Eyes: Pupils are equally reactive to light.  No evidence of scleral icterus or conjunctival pallor.  ENMT: Moist mucous membranes noted.  Posterior pharynx clear of any exudate or lesions.   Respiratory: Diminished breath sounds at the bases.  No evidence of wheezing. Normal respiratory effort. No accessory muscle use.  Cardiovascular: Regular rate and rhythm, no murmurs / rubs / gallops. No extremity edema. 2+ pedal pulses. No carotid bruits.  Abdomen: Abdomen is soft and nontender.  No evidence of intra-abdominal masses.  Positive bowel sounds noted in all quadrants.   Musculoskeletal: No joint deformity upper and lower extremities. Good ROM, no contractures. Normal muscle tone.    Data Reviewed:  I have personally reviewed and interpreted labs, imaging.  Significant findings are   CBC: Recent Labs  Lab 07/17/24 0748 07/18/24 0305 07/20/24 0554 07/21/24 0341 07/22/24 0328  WBC 8.7 8.5 6.7 7.5 7.4  HGB 8.2* 8.2* 8.1* 7.7* 7.7*  HCT 30.0* 29.7* 28.6* 27.1* 27.1*  MCV 79.4* 79.4* 78.6* 78.3* 77.7*  PLT 222 212 195 204 218   Basic Metabolic Panel: Recent Labs  Lab 07/17/24 0748 07/18/24 0305 07/20/24 0554 07/21/24 0341 07/22/24 0328  NA 134* 136 133* 135 131*  K 4.1 4.4 3.8 3.7 3.6  CL 100 101 94* 95* 92*  CO2 28 25 29 29 28   GLUCOSE 119* 126* 108* 101* 101*  BUN 11 12 16 17 14   CREATININE 1.25* 1.35* 1.29* 1.13 1.05  CALCIUM 8.2* 8.8* 8.6* 8.7* 8.4*    Telemetry: Personally reviewed.  Rhythm is sinus rhythm with heart rate of 89 bpm.   Code Status:  DNR.  Code status decision has been confirmed with: patient Family Communication: Spouse is at the bedside and has been updated on plan of care.   Severity of Illness:  The appropriate patient status for this patient is INPATIENT. Inpatient status is judged to be reasonable and necessary in order to provide the required intensity of service to ensure the patient's safety. The patient's presenting symptoms,  physical exam findings, and initial radiographic and laboratory data in the context of their chronic comorbidities is felt to place them at high risk for further clinical deterioration. Furthermore, it is not anticipated that the patient will be medically stable for discharge from the hospital within 2 midnights of admission.   * I certify that at the point of admission it is my clinical judgment that the patient will require inpatient hospital care spanning beyond 2 midnights from the point of admission due to high intensity of service, high risk for further deterioration and high frequency of surveillance required.*  Time spent:  54 minutes  Author:  Zachary JINNY Ba MD  07/22/2024 6:43 PM

## 2024-07-22 NOTE — Assessment & Plan Note (Signed)
.   Patient been placed on Accu-Cheks before every meal and nightly with sliding scale insulin . Holding home regimen of hypoglycemics . Hemoglobin A1C ordered . Diabetic Diet  

## 2024-07-22 NOTE — Assessment & Plan Note (Signed)
 Lasix  has been transitioned to oral therapy Clinically nearing euvolemia Remainder of assessment and plan as above

## 2024-07-22 NOTE — TOC Progression Note (Signed)
 Transition of Care [facility]) - Progression Note    Patient Details  Name: Bradley [facility] MRN: [id number] Date of Birth: [birth year]  Transition of Care [facility]) CM/SW Contact  Jon ONEIDA Anon, RN Phone Number: 07/22/2024, 11:53 AM  Clinical Narrative:    Pt is recommended for SNF at discharge. Pt is skeptical about going to SNF due to an unpleasant experience in the past. Pt family are wanting pt to get STR at [facility] for his benefit. Pt adamantly wanting to return home at discharge. NCM explained would send out referrals to see what bed offers are available and then discuss at a later time. Pt/spouse voices understanding. Referrals sent out for SNF placement; awaiting bed offers. IP Care Management continuing to follow.     Barriers to Discharge: Continued Medical Work up               Expected Discharge Plan and Services In-house Referral: NA Discharge Planning Services: NA   Living arrangements for the past 2 months: Single Family Home                 DME Arranged: N/A DME Agency: NA       HH Arranged: NA HH Agency: NA         Social Drivers of Health (SDOH) Interventions SDOH Screenings   Food Insecurity: No Food Insecurity (07/14/2024)  Housing: Low Risk  (07/14/2024)  Transportation Needs: No Transportation Needs (07/14/2024)  Utilities: Not At Risk (07/14/2024)  Alcohol  Screen: Low Risk  (07/12/2024)  Depression (PHQ2-9): Low Risk  (07/12/2024)  Financial Resource Strain: Low Risk  (07/12/2024)  Physical Activity: Inactive (07/12/2024)  Social Connections: Socially Integrated (07/14/2024)  Stress: No Stress Concern Present (07/12/2024)  Tobacco Use: Medium Risk (07/14/2024)  Health Literacy: Adequate Health Literacy (07/12/2024)    Readmission Risk Interventions    07/16/2024    9:18 AM  Readmission Risk Prevention Plan  Transportation Screening Complete  PCP or Specialist Appt within 3-5 Days Complete  HRI or Home Care Consult Complete  Social Work Consult for  Recovery Care Planning/Counseling Complete  Palliative Care Screening Not Applicable  Medication Review Oceanographer) Complete

## 2024-07-22 NOTE — NC FL2 (Signed)
 [place]  MEDICAID FL2 LEVEL OF CARE FORM     IDENTIFICATION  Patient Name: Bradley [facility] Birthdate: [birth year] Sex: male Admission Date (Current Location): 07/14/2024  [facility] and [place] Number:  Producer, television/film/video and Address:  [facility],  [address] [place], [place] [address]      Provider Number: [id number]  Attending Physician Name and Address:  Kenard Zachary PARAS, MD  Relative Name and Phone Number:  Taysen, [place] [facility]  [phone]    Current Level of Care: Hospital Recommended Level of Care: Skilled Nursing Facility Prior Approval Number:    Date Approved/Denied:   PASRR Number: [id number]  Discharge Plan: SNF    Current Diagnoses: Patient Active Problem List   Diagnosis Date Noted   Lobar pneumonia (HCC) 07/15/2024   Gastric adenocarcinoma (HCC) 07/15/2024   Community acquired pneumonia of left lower lobe of lung 07/14/2024   Chronic heart failure with preserved ejection fraction (HFpEF) (HCC) 07/14/2024   History of pulmonary embolism 07/14/2024   Asthma 07/14/2024   Precordial pain 06/06/2024   Blood clotting disorder (HCC) 08/19/2021   Acute respiratory failure with hypoxia and hypercapnia (HCC) 07/31/2021   Pulmonary embolism (HCC) 07/13/2021   Coronary artery disease involving native coronary artery of native heart without angina pectoris 05/02/2021   Thoracic aortic aneurysm without rupture (HCC) 05/03/2020   Exudative age-related macular degeneration of left eye with active choroidal neovascularization (HCC) 03/28/2020   Retinal hemorrhage of left eye 03/28/2020   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   Pseudophakia 03/28/2020   Right epiretinal membrane 03/28/2020   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 03/28/2020   Abdominal aortic aneurysm (AAA) 30 to 34 mm in diameter (HCC) 09/24/2019   Laboratory examination 03/07/2019   Dilated aortic root (HCC) 02/04/2019   Enlargement of  abdominal aorta (HCC) 02/04/2019   Acute kidney injury (HCC) 05/22/2016   Hyponatremia 05/22/2016   Hyperlipidemia LDL goal <70 12/27/2015   OSA (obstructive sleep apnea) 04/26/2015   Edema, lower extremity 06/13/2013   Retention, urine 12/23/2012   Hypokalemia 12/22/2012   Postoperative anemia due to acute blood loss 12/22/2012   Skin bulla 12/22/2012   Essential hypertension    Type 2 diabetes mellitus (HCC)    Hypercholesteremia    Lichen planus    Left knee DJD    Morbid obesity with BMI of 45.0-49.9, adult (HCC)    Sleep apnea, obstructive    Benign prostate hyperplasia    Diabetes mellitus (HCC) 05/29/2011    Orientation RESPIRATION BLADDER Height & Weight     Situation, Place, Time, Self  O2 Continent Weight: (!) 136.6 kg Height:  6' 2 (188 cm)  BEHAVIORAL SYMPTOMS/MOOD NEUROLOGICAL BOWEL NUTRITION STATUS      Continent Diet (Regular)  AMBULATORY STATUS COMMUNICATION OF NEEDS Skin   Extensive Assist Verbally Normal                       Personal Care Assistance Level of Assistance  Bathing, Feeding, Dressing Bathing Assistance: Limited assistance Feeding assistance: Limited assistance Dressing Assistance: Limited assistance     Functional Limitations Info  Sight, Hearing, Speech Sight Info: Impaired Hearing Info: Impaired Speech Info: Adequate    SPECIAL CARE FACTORS FREQUENCY  PT (By licensed PT), OT (By licensed OT)     PT Frequency: 5x/wk OT Frequency: 5x/wk            Contractures Contractures Info: Not present    Additional Factors Info  Code Status, Allergies Code Status Info: DNR Allergies Info: No Known Allergies           Current Medications (07/22/2024):  This is the current hospital active medication list Current Facility-Administered Medications  Medication Dose Route Frequency Provider Last Rate Last Admin   acetaminophen  (TYLENOL ) tablet 650 mg  650 mg Oral Q6H PRN Patel, Vishal R, MD   650 mg at 07/16/24 1830   Or    acetaminophen  (TYLENOL ) suppository 650 mg  650 mg Rectal Q6H PRN Patel, Vishal R, MD       albuterol  (PROVENTIL ) (2.5 MG/3ML) 0.083% nebulizer solution 2.5 mg  2.5 mg Nebulization Q6H PRN Patel, Vishal R, MD       artificial tears ophthalmic solution 1 drop  1 drop Both Eyes TID PRN Jadine Toribio SQUIBB, MD       carvedilol  (COREG ) tablet 25 mg  25 mg Oral BID WC Patel, Vishal R, MD   25 mg at 07/22/24 0750   Chlorhexidine  Gluconate Cloth 2 % PADS 6 each  6 each Topical Q2200 Jadine Toribio SQUIBB, MD   6 each at 07/21/24 2303   felodipine  (PLENDIL ) 24 hr tablet 10 mg  10 mg Oral q AM Jadine Toribio SQUIBB, MD   10 mg at 07/22/24 0750   ferrous sulfate  tablet 325 mg  325 mg Oral BID WC Cloretta Arley NOVAK, MD   325 mg at 07/22/24 0750   fluticasone  furoate-vilanterol (BREO ELLIPTA ) 200-25 MCG/ACT 1 puff  1 puff Inhalation Daily Tobie Jorie SAUNDERS, MD   1 puff at 07/22/24 0806   furosemide  (LASIX ) tablet 40 mg  40 mg Oral Daily Sreeram, Narendranath, MD   40 mg at 07/22/24 9074   guaiFENesin  (MUCINEX ) 12 hr tablet 600 mg  600 mg Oral BID Patel, Vishal R, MD   600 mg at 07/22/24 9074   hydrALAZINE  (APRESOLINE ) injection 10 mg  10 mg Intravenous Q4H PRN Patel, Vishal R, MD   10 mg at 07/18/24 9383   HYDROcodone -acetaminophen  (NORCO/VICODIN) 5-325 MG per tablet 1 tablet  1 tablet Oral BID PRN Nicholaus Quarry, [facility]       HYDROmorphone  (DILAUDID ) injection 0.5 mg  0.5 mg Intravenous Q4H PRN Darci Pore, MD       insulin  aspart (novoLOG ) injection 0-6 Units  0-6 Units Subcutaneous TID WC Shalhoub, George J, MD       isosorbide -hydrALAZINE  (BIDIL ) 20-37.5 MG per tablet 2 tablet  2 tablet Oral TID Patel, Vishal R, MD   2 tablet at 07/22/24 9074   lidocaine  (LIDODERM ) 5 % 1 patch  1 patch Transdermal Q24H Jadine Toribio SQUIBB, MD   1 patch at 07/21/24 9082   losartan  (COZAAR ) tablet 100 mg  100 mg Oral Daily Shalhoub, George J, MD   100 mg at 07/22/24 9074   methocarbamol  (ROBAXIN ) tablet 500 mg  500 mg Oral Q8H PRN  Sreeram, Narendranath, MD   500 mg at 07/21/24 9082   nystatin  (MYCOSTATIN ) 100000 UNIT/ML suspension 500,000 Units  5 mL Mouth/Throat QID Sreeram, Narendranath, MD   500,000 Units at 07/22/24 9074   ondansetron  (ZOFRAN ) tablet 4 mg  4 mg Oral Q6H PRN Patel, Vishal R, MD       Or   ondansetron  (ZOFRAN ) injection 4 mg  4 mg Intravenous Q6H PRN Patel, Vishal R, MD       Oral care mouth rinse  15 mL Mouth Rinse PRN Jadine Toribio SQUIBB, MD       pantoprazole  (PROTONIX ) EC tablet 40 mg  40 mg Oral Daily Patel, Vishal R, MD   40 mg at 07/22/24 9074   polyethylene glycol (MIRALAX  / GLYCOLAX ) packet 17 g  17 g Oral BID Jadine Toribio SQUIBB, MD   17 g at 07/22/24 9074   senna (SENOKOT) tablet 8.6 mg  1 tablet Oral QHS Jadine Toribio SQUIBB, MD   8.6 mg at 07/21/24 2142   simvastatin  (ZOCOR ) tablet 40 mg  40 mg Oral QPM Patel, Vishal R, MD   40 mg at 07/21/24 1846   sodium chloride  flush (NS) 0.9 % injection 3 mL  3 mL Intravenous Q12H Patel, Vishal R, MD   3 mL at 07/22/24 9074   spironolactone  (ALDACTONE ) tablet 25 mg  25 mg Oral Daily Jadine Toribio SQUIBB, MD   25 mg at 07/22/24 9074     Discharge Medications: Please see discharge summary for a list of discharge medications.  Relevant Imaging Results:  Relevant Lab Results:   Additional Information SSN: [id number]  Jon ONEIDA Anon, RN

## 2024-07-22 NOTE — Progress Notes (Signed)
 The proposed treatment discussed in conference is for discussion purpose only and is not a binding recommendation.  The patients have not been physically examined, or presented with their treatment options.  Therefore, final treatment plans cannot be decided.

## 2024-07-22 NOTE — Progress Notes (Signed)
 Physical Therapy Treatment Patient Details Name: Bradley Alvarez MRN: [id number] DOB: [birth year] Today's Date: 07/22/2024   History of Present Illness 83 year old male PMH  presented 07/14/24  with shortness of breath.  Admitted for acute hypoxic and hypercapnic respiratory failure secondary to left lower lobe pneumonia, requiring BiPAP. 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    PT Comments   Pt admitted with above diagnosis.  Pt currently with functional limitations due to the deficits listed below (see PT Problem List). Pt in bed when therapist arrived. Spouse present. Pt agreeable to therapy intervention. Pt required S and increased time with use of hospital bed for supine to sit, pt required CGA for sit to stand  from EOB to upright rollator, gait trails in personal room with upright rollator, CGA and min cues with 5 bouts x 4 with anterior and retrograde stepping patterns and standing therapeutic rest break between amb bouts with pt remaining on HHFNC. Pt is progressing well with therapy today and is motivated for d/c home vs rehab at [facility].  Pt left seated in recliner and all needs in place. Pt will benefit from acute skilled PT to increase their independence and safety with mobility to allow discharge.      If plan is discharge home, recommend the following: A lot of help with walking and/or transfers;A lot of help with bathing/dressing/bathroom;Assistance with cooking/housework;Assist for transportation;Help with stairs or ramp for entrance   Can travel by private vehicle     No  Equipment Recommendations  None recommended by PT    Recommendations for Other Services       Precautions / Restrictions Precautions Precautions: Fall Precaution/Restrictions Comments: on HHFNC, monitor vitals Restrictions Weight Bearing Restrictions Per Provider Order: No Other Position/Activity  Restrictions: on HFNC     Mobility  Bed Mobility Overal bed mobility: Needs Assistance Bed Mobility: Supine to Sit     Supine to sit: Contact guard, HOB elevated, Used rails     General bed mobility comments: increased time and use of hospital bed --caution for multiple lines/leads    Transfers Overall transfer level: Needs assistance Equipment used: Rollator (4 wheels) Transfers: Sit to/from Stand Sit to Stand: Contact guard assist, Min assist           General transfer comment: pt able to rise from elevated bed with good use of UE support for stabiltiy on upright rollator and demonstrated good recall for brake management    Ambulation/Gait Ambulation/Gait assistance: Contact guard assist Gait Distance (Feet): 5 Feet Assistive device: Standup Rollator Gait Pattern/deviations: Step-through pattern, Decreased dorsiflexion - left, Decreased dorsiflexion - right, Trunk flexed, Wide base of support Gait velocity: decreased     General Gait Details: pt demonstrates trunk flexion, B toe out, limited foot clearance and wide BOS with cues pt able to perofrm 4 x 5 feet anterior and retrograde stepping patterns with upright rollator with standing therapeutic rest break between amb bouts and cues for pursed lip breathing with pt maintaining O2 saturation >=90% on HHFNC at 55% and 20 L/min   Stairs             Wheelchair Mobility     Tilt Bed    Modified Rankin (Stroke Patients Only)       Balance Overall balance assessment: Needs assistance Sitting-balance support: Bilateral upper extremity supported, Feet supported Sitting balance-Leahy Scale: Fair     Standing balance support: Bilateral upper extremity supported,  During functional activity, Reliant on assistive device for balance Standing balance-Leahy Scale: Fair                              Hotel manager: No apparent difficulties  Cognition Arousal: Alert Behavior  During Therapy: WFL for tasks assessed/performed   PT - Cognitive impairments: No apparent impairments                         Following commands: Intact      Cueing    Exercises      General Comments        Pertinent Vitals/Pain Pain Assessment Pain Assessment: No/denies pain    Home Living                          Prior Function            PT Goals (current goals can now be found in the care plan section) Acute Rehab PT Goals Patient Stated Goal: to get moving PT Goal Formulation: With patient/family Time For Goal Achievement: 08/01/24 Potential to Achieve Goals: Good Progress towards PT goals: Progressing toward goals    Frequency    Min 3X/week      PT Plan      Co-evaluation              AM-PAC PT 6 Clicks Mobility   Outcome Measure  Help needed turning from your back to your side while in a flat bed without using bedrails?: A Little Help needed moving from lying on your back to sitting on the side of a flat bed without using bedrails?: A Little Help needed moving to and from a bed to a chair (including a wheelchair)?: A Little Help needed standing up from a chair using your arms (e.g., wheelchair or bedside chair)?: A Little Help needed to walk in hospital room?: A Little Help needed climbing 3-5 steps with a railing? : Total 6 Click Score: 16    End of Session Equipment Utilized During Treatment: Gait belt Activity Tolerance: Patient tolerated treatment well Patient left: in chair;with call bell/phone within reach;with family/visitor present Nurse Communication: Mobility status PT Visit Diagnosis: Unsteadiness on feet (R26.81);Other abnormalities of gait and mobility (R26.89);Muscle weakness (generalized) (M62.81);Difficulty in walking, not elsewhere classified (R26.2)     Time: 8846-8791 PT Time Calculation (min) (ACUTE ONLY): 15 min  Charges:    $Gait Training: 8-22 mins PT General Charges $$ ACUTE PT  VISIT: 1 Visit                     [place], PT Acute Rehab    [place] VEAR Drone 07/22/2024, 1:29 PM

## 2024-07-22 NOTE — Assessment & Plan Note (Signed)
 Dr. Cloretta with oncology following, his input is appreciated Newly diagnosed 8/1 CT being done 8/6 revealing concerning findings for metastatic nodal disease Close outpatient follow-up for rapid evaluation and treatment

## 2024-07-22 NOTE — Assessment & Plan Note (Signed)
 Strict intake and output monitoring Creatinine near baseline Minimizing nephrotoxic agents as much as possible Serial chemistries to monitor renal function and electrolytes

## 2024-07-22 NOTE — Assessment & Plan Note (Signed)
 Diagnosed 2022 Now off Eliquis  due to GI bleeding

## 2024-07-22 NOTE — Assessment & Plan Note (Signed)
 Community-acquired pneumonia secondary to left lower lobe pneumonia in the setting of obesity hypoventilation syndrome with superimposed acute on chronic diastolic congestive heart failure. weaning supplemental oxygen  as tolerated, attempting to transition off of high flow oxygen  back down to patient's baseline oxygen  requirement of 2 to 3 L via nasal cannula. Target oxygen  saturation to be 89 to 92% 5 days of intravenous antibiotics completed with Rocephin  and azithromycin  for community-acquired pneumonia

## 2024-07-22 NOTE — Assessment & Plan Note (Signed)
 Outpatient follow-up with Dr. Lucas, stable at 4.8 cm

## 2024-07-22 NOTE — Assessment & Plan Note (Signed)
 Blood pressure is above target Resuming home regimen of Cozaar  Continue Coreg  and Aldactone 

## 2024-07-23 DIAGNOSIS — I5033 Acute on chronic diastolic (congestive) heart failure: Secondary | ICD-10-CM | POA: Diagnosis not present

## 2024-07-23 DIAGNOSIS — N1831 Chronic kidney disease, stage 3a: Secondary | ICD-10-CM | POA: Diagnosis not present

## 2024-07-23 DIAGNOSIS — J9601 Acute respiratory failure with hypoxia: Secondary | ICD-10-CM | POA: Diagnosis not present

## 2024-07-23 DIAGNOSIS — I10 Essential (primary) hypertension: Secondary | ICD-10-CM | POA: Diagnosis not present

## 2024-07-23 LAB — RETICULOCYTES
Immature Retic Fract: 17.1 % — ABNORMAL HIGH (ref 2.3–15.9)
RBC.: 3.66 MIL/uL — ABNORMAL LOW (ref 4.22–5.81)
Retic Count, Absolute: 65.5 K/uL (ref 19.0–186.0)
Retic Ct Pct: 1.8 % (ref 0.4–3.1)

## 2024-07-23 LAB — CBC WITH DIFFERENTIAL/PLATELET
Abs Immature Granulocytes: 0.02 K/uL (ref 0.00–0.07)
Basophils Absolute: 0 K/uL (ref 0.0–0.1)
Basophils Relative: 1 %
Eosinophils Absolute: 0.2 K/uL (ref 0.0–0.5)
Eosinophils Relative: 3 %
HCT: 27.8 % — ABNORMAL LOW (ref 39.0–52.0)
Hemoglobin: 7.8 g/dL — ABNORMAL LOW (ref 13.0–17.0)
Immature Granulocytes: 0 %
Lymphocytes Relative: 19 %
Lymphs Abs: 1.3 K/uL (ref 0.7–4.0)
MCH: 22 pg — ABNORMAL LOW (ref 26.0–34.0)
MCHC: 28.1 g/dL — ABNORMAL LOW (ref 30.0–36.0)
MCV: 78.3 fL — ABNORMAL LOW (ref 80.0–100.0)
Monocytes Absolute: 1.2 K/uL — ABNORMAL HIGH (ref 0.1–1.0)
Monocytes Relative: 18 %
Neutro Abs: 4 K/uL (ref 1.7–7.7)
Neutrophils Relative %: 59 %
Platelets: 223 K/uL (ref 150–400)
RBC: 3.55 MIL/uL — ABNORMAL LOW (ref 4.22–5.81)
RDW: 20 % — ABNORMAL HIGH (ref 11.5–15.5)
WBC: 6.8 K/uL (ref 4.0–10.5)
nRBC: 0 % (ref 0.0–0.2)

## 2024-07-23 LAB — COMPREHENSIVE METABOLIC PANEL WITH GFR
ALT: 38 U/L (ref 0–44)
AST: 39 U/L (ref 15–41)
Albumin: 2.8 g/dL — ABNORMAL LOW (ref 3.5–5.0)
Alkaline Phosphatase: 38 U/L (ref 38–126)
Anion gap: 8 (ref 5–15)
BUN: 14 mg/dL (ref 8–23)
CO2: 32 mmol/L (ref 22–32)
Calcium: 8.5 mg/dL — ABNORMAL LOW (ref 8.9–10.3)
Chloride: 96 mmol/L — ABNORMAL LOW (ref 98–111)
Creatinine, Ser: 1.38 mg/dL — ABNORMAL HIGH (ref 0.61–1.24)
GFR, Estimated: 51 mL/min — ABNORMAL LOW (ref >60)
Glucose, Bld: 107 mg/dL — ABNORMAL HIGH (ref 70–99)
Potassium: 4.1 mmol/L (ref 3.5–5.1)
Sodium: 136 mmol/L (ref 135–145)
Total Bilirubin: 0.4 mg/dL (ref 0.0–1.2)
Total Protein: 6.6 g/dL (ref 6.5–8.1)

## 2024-07-23 LAB — FERRITIN: Ferritin: 43 ng/mL (ref 24–336)

## 2024-07-23 LAB — VITAMIN B12: Vitamin B-12: 510 pg/mL (ref 180–914)

## 2024-07-23 LAB — GLUCOSE, CAPILLARY
Glucose-Capillary: 124 mg/dL — ABNORMAL HIGH (ref 70–99)
Glucose-Capillary: 125 mg/dL — ABNORMAL HIGH (ref 70–99)
Glucose-Capillary: 153 mg/dL — ABNORMAL HIGH (ref 70–99)

## 2024-07-23 LAB — IRON AND TIBC
Iron: 31 ug/dL — ABNORMAL LOW (ref 45–182)
Saturation Ratios: 10 % — ABNORMAL LOW (ref 17.9–39.5)
TIBC: 319 ug/dL (ref 250–450)
UIBC: 288 ug/dL

## 2024-07-23 LAB — MAGNESIUM: Magnesium: 2 mg/dL (ref 1.7–2.4)

## 2024-07-23 LAB — FOLATE: Folate: 17 ng/mL (ref >5.9)

## 2024-07-23 MED ORDER — SENNA 8.6 MG PO TABS
2.0000 | ORAL_TABLET | Freq: Every day | ORAL | Status: DC
  Administered 2024-07-24 – 2024-07-26 (×3): 17.2 mg via ORAL
  Filled 2024-07-23 (×3): qty 2

## 2024-07-23 MED ORDER — IRON SUCROSE 200 MG IVPB - SIMPLE MED
200.0000 mg | Freq: Once | Status: AC
  Administered 2024-07-23: 200 mg via INTRAVENOUS
  Filled 2024-07-23: qty 110

## 2024-07-23 MED ORDER — FERROUS SULFATE 325 (65 FE) MG PO TABS
325.0000 mg | ORAL_TABLET | Freq: Every day | ORAL | Status: DC
  Administered 2024-07-24 – 2024-07-27 (×4): 325 mg via ORAL
  Filled 2024-07-23 (×4): qty 1

## 2024-07-23 MED ORDER — LOSARTAN POTASSIUM 50 MG PO TABS
50.0000 mg | ORAL_TABLET | Freq: Every day | ORAL | Status: DC
  Administered 2024-07-24 – 2024-07-27 (×4): 50 mg via ORAL
  Filled 2024-07-23 (×4): qty 1

## 2024-07-23 NOTE — Assessment & Plan Note (Signed)
 Strict intake and output monitoring Creatinine near baseline Minimizing nephrotoxic agents as much as possible Serial chemistries to monitor renal function and electrolytes

## 2024-07-23 NOTE — Assessment & Plan Note (Signed)
 Continue simvastatin .

## 2024-07-23 NOTE — Assessment & Plan Note (Signed)
 Blood pressure is much better controlled on Cozaar , Coreg  and Aldactone .

## 2024-07-23 NOTE — Assessment & Plan Note (Signed)
 Outpatient follow-up with Dr. Lucas, stable at 4.8 cm

## 2024-07-23 NOTE — Assessment & Plan Note (Signed)
 Dr. Cloretta with oncology following, his input is appreciated Newly diagnosed 8/1 CT being done 8/6 revealing concerning findings for metastatic nodal disease Close outpatient follow-up for rapid evaluation and treatment

## 2024-07-23 NOTE — Assessment & Plan Note (Signed)
 Community-acquired pneumonia secondary to left lower lobe pneumonia in the setting of obesity hypoventilation syndrome with superimposed acute on chronic diastolic congestive heart failure. Patient has been weaned to 3 L of oxygen  via nasal cannula.  Continuing to wean to room air.  Patient typically uses 2 to 3 L of oxygen  with exertion. Target oxygen  saturation to be 89 to 92% 5 days of intravenous antibiotics completed with Rocephin  and azithromycin  for community-acquired pneumonia

## 2024-07-23 NOTE — Progress Notes (Signed)
 Patient placed on home CPAP.

## 2024-07-23 NOTE — Assessment & Plan Note (Signed)
 Secondary to presumed slow gastrointestinal bleeding from gastric malignancy Iron  panel consistent with iron  deficiency. Vitamin B12, folate unremarkable Providing patient with infusion of intravenous Venofer  followed by daily iron  supplementation Monitoring hemoglobin and hematocrit with serial CBCs Will transfuse if hemoglobin drops below 7.

## 2024-07-23 NOTE — Assessment & Plan Note (Signed)
 Lasix  has been transitioned to oral therapy Clinically nearing euvolemia Remainder of assessment and plan as above

## 2024-07-23 NOTE — Assessment & Plan Note (Signed)
 CPAP nightly

## 2024-07-23 NOTE — Assessment & Plan Note (Signed)
 Patient been placed on Accu-Cheks before every meal and nightly with sliding scale insulin  Holding home regimen of hypoglycemics Hemoglobin A1C 5.9% Diabetic Diet

## 2024-07-23 NOTE — Progress Notes (Signed)
 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   Upon evaluation in the emergency department patient was noted to be suffering from acute hypoxic and hypercapnic respiratory failure secondary to left lower lobe pneumonia.  Requiring BiPAP.  Hospitalist was called to assess the patient for admission to hospital and was admitted to the stepdown unit.    Patient was placed on intravenous antibiotics with ceftriaxone  and azithromycin .  Blood cultures were obtained and were found to be unremarkable.  In the days that followed patient was weaned off BiPAP and transitioned to high flow nasal cannula.   Assessment & Plan Acute respiratory failure with hypoxia and hypercapnia (HCC) Community-acquired pneumonia secondary to left lower lobe pneumonia in the setting of obesity hypoventilation syndrome with superimposed acute on chronic diastolic congestive heart failure. Patient has been weaned to 3 L of oxygen  via nasal cannula.  Continuing to wean to room air.  Patient typically uses 2 to 3 L of oxygen  with exertion. Target oxygen  saturation to be 89 to 92% 5 days of intravenous antibiotics completed with Rocephin  and azithromycin  for community-acquired pneumonia Acute on chronic diastolic CHF (congestive heart failure) (HCC) Lasix  has been transitioned to oral therapy Clinically  nearing euvolemia Remainder of assessment and plan as above Iron  deficiency anemia due to chronic blood loss Secondary to presumed slow gastrointestinal bleeding from gastric malignancy Iron  panel consistent with iron  deficiency. Vitamin B12, folate unremarkable Providing patient with infusion of intravenous Venofer  followed by daily iron  supplementation Monitoring hemoglobin and hematocrit with serial CBCs Will transfuse if hemoglobin drops below 7. Essential hypertension Blood pressure is much better controlled on Cozaar , Coreg  and Aldactone . Type 2 diabetes mellitus (HCC) Patient been placed on Accu-Cheks before every meal and nightly with sliding scale insulin  Holding home regimen of hypoglycemics Hemoglobin A1C 5.9% Diabetic Diet  Hypercholesteremia Continue simvastatin  OSA (obstructive sleep apnea) CPAP nightly History of pulmonary embolism Diagnosed 2022 Now off Eliquis  due to GI bleeding Gastric adenocarcinoma (HCC) Dr. Cloretta with oncology following, his input is appreciated Newly diagnosed 8/1 CT being done 8/6 revealing concerning findings for metastatic nodal disease Close outpatient follow-up for rapid evaluation and treatment Thoracic aortic aneurysm without rupture [facility]) Outpatient follow-up with Dr. Lucas, stable at 4.8 cm Chronic kidney disease, stage 3a (HCC) Strict intake and output monitoring Creatinine near baseline Minimizing nephrotoxic agents as much as possible Serial chemistries to monitor renal function and electrolytes    Subjective:  Patient denies shortness of breath or chest pain.  Appetite is improving.  Strength is additionally improving.  Physical Exam:  Vitals:   07/23/24 1327 07/23/24 1646 07/23/24 1920 07/23/24 2221  BP: (!) 120/58 110/65 139/71   Pulse: (!) 58 (!) 57 63   Resp: 20  18 19   Temp: 97.9 F (36.6 C)  98.2 F (36.8 C)   TempSrc: Oral     SpO2: 96%  95%   Weight:  Height:        Constitutional: Awake  alert and oriented x3, no associated distress.   Skin: no rashes, no lesions, good skin turgor noted. Eyes: Pupils are equally reactive to light.  No evidence of scleral icterus or conjunctival pallor.  ENMT: Moist mucous membranes noted.  Posterior pharynx clear of any exudate or lesions.   Respiratory: Diminished breath sounds at the bases.  No evidence of wheezing. Normal respiratory effort. No accessory muscle use.  Cardiovascular: Regular rate and rhythm, no murmurs / rubs / gallops. No extremity edema. 2+ pedal pulses. No carotid bruits.  Abdomen: Abdomen is soft and nontender.  No evidence of intra-abdominal masses.  Positive bowel sounds noted in all quadrants.   Musculoskeletal: No joint deformity upper and lower extremities. Good ROM, no contractures. Normal muscle tone.    Data Reviewed:  I have personally reviewed and interpreted labs, imaging.  Significant findings are   CBC: Recent Labs  Lab 07/18/24 0305 07/20/24 0554 07/21/24 0341 07/22/24 0328 07/23/24 0300  WBC 8.5 6.7 7.5 7.4 6.8  NEUTROABS  --   --   --   --  4.0  HGB 8.2* 8.1* 7.7* 7.7* 7.8*  HCT 29.7* 28.6* 27.1* 27.1* 27.8*  MCV 79.4* 78.6* 78.3* 77.7* 78.3*  PLT 212 195 204 218 223   Basic Metabolic Panel: Recent Labs  Lab 07/18/24 0305 07/20/24 0554 07/21/24 0341 07/22/24 0328 07/23/24 0300  NA 136 133* 135 131* 136  K 4.4 3.8 3.7 3.6 4.1  CL 101 94* 95* 92* 96*  CO2 25 29 29 28  32  GLUCOSE 126* 108* 101* 101* 107*  BUN 12 16 17 14 14   CREATININE 1.35* 1.29* 1.13 1.05 1.38*  CALCIUM 8.8* 8.6* 8.7* 8.4* 8.5*  MG  --   --   --   --  2.0    Telemetry: Personally reviewed.  Rhythm is sinus rhythm with heart rate of 89 bpm.   Code Status:  DNR.  Code status decision has been confirmed with: patient Family Communication: Daughter is at the bedside and has been updated on plan of care.   Severity of Illness:  The appropriate patient status for this patient is INPATIENT. Inpatient status is  judged to be reasonable and necessary in order to provide the required intensity of service to ensure the patient's safety. The patient's presenting symptoms, physical exam findings, and initial radiographic and laboratory data in the context of their chronic comorbidities is felt to place them at high risk for further clinical deterioration. Furthermore, it is not anticipated that the patient will be medically stable for discharge from the hospital within 2 midnights of admission.   * I certify that at the point of admission it is my clinical judgment that the patient will require inpatient hospital care spanning beyond 2 midnights from the point of admission due to high intensity of service, high risk for further deterioration and high frequency of surveillance required.*  Time spent:  41 minutes  Author:  Zachary JINNY Ba MD  07/23/2024 10:33 PM

## 2024-07-23 NOTE — Assessment & Plan Note (Signed)
 Diagnosed 2022 Now off Eliquis  due to GI bleeding

## 2024-07-23 NOTE — Plan of Care (Signed)
  Problem: Education: Goal: Knowledge of General Education information will improve Description: Including pain rating scale, medication(s)/side effects and non-pharmacologic comfort measures Outcome: Progressing   Problem: Clinical Measurements: Goal: Respiratory complications will improve Outcome: Progressing   Problem: Nutrition: Goal: Adequate nutrition will be maintained Outcome: Progressing   Problem: Pain Managment: Goal: General experience of comfort will improve and/or be controlled Outcome: Progressing   Problem: Clinical Measurements: Goal: Cardiovascular complication will be avoided Outcome: Not Progressing

## 2024-07-24 DIAGNOSIS — I10 Essential (primary) hypertension: Secondary | ICD-10-CM | POA: Diagnosis not present

## 2024-07-24 DIAGNOSIS — J9601 Acute respiratory failure with hypoxia: Secondary | ICD-10-CM | POA: Diagnosis not present

## 2024-07-24 DIAGNOSIS — I5033 Acute on chronic diastolic (congestive) heart failure: Secondary | ICD-10-CM | POA: Diagnosis not present

## 2024-07-24 DIAGNOSIS — N1831 Chronic kidney disease, stage 3a: Secondary | ICD-10-CM | POA: Diagnosis not present

## 2024-07-24 LAB — COMPREHENSIVE METABOLIC PANEL WITH GFR
ALT: 33 U/L (ref 0–44)
AST: 36 U/L (ref 15–41)
Albumin: 2.8 g/dL — ABNORMAL LOW (ref 3.5–5.0)
Alkaline Phosphatase: 40 U/L (ref 38–126)
Anion gap: 10 (ref 5–15)
BUN: 15 mg/dL (ref 8–23)
CO2: 30 mmol/L (ref 22–32)
Calcium: 8.6 mg/dL — ABNORMAL LOW (ref 8.9–10.3)
Chloride: 95 mmol/L — ABNORMAL LOW (ref 98–111)
Creatinine, Ser: 1.28 mg/dL — ABNORMAL HIGH (ref 0.61–1.24)
GFR, Estimated: 56 mL/min — ABNORMAL LOW (ref >60)
Glucose, Bld: 104 mg/dL — ABNORMAL HIGH (ref 70–99)
Potassium: 4.1 mmol/L (ref 3.5–5.1)
Sodium: 135 mmol/L (ref 135–145)
Total Bilirubin: 0.7 mg/dL (ref 0.0–1.2)
Total Protein: 6.7 g/dL (ref 6.5–8.1)

## 2024-07-24 LAB — CBC WITH DIFFERENTIAL/PLATELET
Abs Immature Granulocytes: 0.03 K/uL (ref 0.00–0.07)
Basophils Absolute: 0 K/uL (ref 0.0–0.1)
Basophils Relative: 0 %
Eosinophils Absolute: 0.2 K/uL (ref 0.0–0.5)
Eosinophils Relative: 3 %
HCT: 28.6 % — ABNORMAL LOW (ref 39.0–52.0)
Hemoglobin: 8 g/dL — ABNORMAL LOW (ref 13.0–17.0)
Immature Granulocytes: 0 %
Lymphocytes Relative: 17 %
Lymphs Abs: 1.2 K/uL (ref 0.7–4.0)
MCH: 22.1 pg — ABNORMAL LOW (ref 26.0–34.0)
MCHC: 28 g/dL — ABNORMAL LOW (ref 30.0–36.0)
MCV: 79 fL — ABNORMAL LOW (ref 80.0–100.0)
Monocytes Absolute: 1.3 K/uL — ABNORMAL HIGH (ref 0.1–1.0)
Monocytes Relative: 18 %
Neutro Abs: 4.5 K/uL (ref 1.7–7.7)
Neutrophils Relative %: 62 %
Platelets: 239 K/uL (ref 150–400)
RBC: 3.62 MIL/uL — ABNORMAL LOW (ref 4.22–5.81)
RDW: 19.9 % — ABNORMAL HIGH (ref 11.5–15.5)
WBC: 7.3 K/uL (ref 4.0–10.5)
nRBC: 0 % (ref 0.0–0.2)

## 2024-07-24 LAB — MAGNESIUM: Magnesium: 2.2 mg/dL (ref 1.7–2.4)

## 2024-07-24 LAB — GLUCOSE, CAPILLARY
Glucose-Capillary: 186 mg/dL — ABNORMAL HIGH (ref 70–99)
Glucose-Capillary: 166 mg/dL — ABNORMAL HIGH (ref 70–99)
Glucose-Capillary: 129 mg/dL — ABNORMAL HIGH (ref 70–99)
Glucose-Capillary: 189 mg/dL — ABNORMAL HIGH (ref 70–99)

## 2024-07-24 MED ORDER — CLOBETASOL PROPIONATE 0.05 % EX CREA: TOPICAL_CREAM | Freq: Two times a day (BID) | CUTANEOUS | Status: DC | PRN

## 2024-07-24 NOTE — Assessment & Plan Note (Signed)
 Strict intake and output monitoring Creatinine near baseline Minimizing nephrotoxic agents as much as possible Serial chemistries to monitor renal function and electrolytes

## 2024-07-24 NOTE — Assessment & Plan Note (Addendum)
 Community-acquired pneumonia secondary to left lower lobe pneumonia in the setting of obesity hypoventilation syndrome with superimposed acute on chronic diastolic congestive heart failure. Patient has been weaned to 2 L of oxygen  via nasal cannula.  Continuing to wean to room air.  Patient typically uses 2 to 3 L of oxygen  with exertion. Target oxygen  saturation to be 89 to 92% 5 days of intravenous antibiotics completed with Rocephin  and azithromycin  for community-acquired pneumonia

## 2024-07-24 NOTE — Assessment & Plan Note (Signed)
 Secondary to presumed slow gastrointestinal bleeding from gastric malignancy Iron  panel consistent with iron  deficiency. Vitamin B12, folate unremarkable Providing patient with infusion of intravenous Venofer  followed by daily iron  supplementation Monitoring hemoglobin and hematocrit with serial CBCs Will transfuse if hemoglobin drops below 7.

## 2024-07-24 NOTE — Assessment & Plan Note (Signed)
 Continue simvastatin .

## 2024-07-24 NOTE — Assessment & Plan Note (Signed)
 Dr. Cloretta with oncology following, his input is appreciated Newly diagnosed 8/1 CT being done 8/6 revealing concerning findings for metastatic nodal disease Close outpatient follow-up for rapid evaluation and treatment

## 2024-07-24 NOTE — Progress Notes (Signed)
   07/24/24 2357  BiPAP/CPAP/SIPAP  BiPAP/CPAP/SIPAP Pt Type Adult  Mask Type Full face mask  Patient Home Machine Yes  Safety Check Completed by RT for Home Unit Yes, no issues noted  Patient Home Mask Yes  Patient Home Tubing Yes  Auto Titrate No  Device Plugged into RED Power Outlet Yes

## 2024-07-24 NOTE — Assessment & Plan Note (Signed)
 Lasix  has been transitioned to oral therapy Clinically nearing euvolemia Remainder of assessment and plan as above

## 2024-07-24 NOTE — Assessment & Plan Note (Signed)
 Diagnosed 2022 Now off Eliquis  due to GI bleeding

## 2024-07-24 NOTE — Plan of Care (Signed)
   Problem: Education: Goal: Knowledge of General Education information will improve Description Including pain rating scale, medication(s)/side effects and non-pharmacologic comfort measures Outcome: Progressing

## 2024-07-24 NOTE — Assessment & Plan Note (Signed)
 CPAP nightly

## 2024-07-24 NOTE — Assessment & Plan Note (Signed)
 Outpatient follow-up with Dr. Lucas, stable at 4.8 cm

## 2024-07-24 NOTE — Plan of Care (Signed)

## 2024-07-24 NOTE — Progress Notes (Signed)
 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   Upon evaluation in the emergency department patient was noted to be suffering from acute hypoxic and hypercapnic respiratory failure secondary to left lower lobe pneumonia.  Requiring BiPAP.  Hospitalist was called to assess the patient for admission to hospital and was admitted to the stepdown unit.    Patient was placed on intravenous antibiotics with ceftriaxone  and azithromycin .  Blood cultures were obtained and were found to be unremarkable.  In the days that followed patient was weaned off BiPAP and transitioned to high flow nasal cannula.   Assessment & Plan Acute respiratory failure with hypoxia and hypercapnia (HCC) Community-acquired pneumonia secondary to left lower lobe pneumonia in the setting of obesity hypoventilation syndrome with superimposed acute on chronic diastolic congestive heart failure. Patient has been weaned to 2 L of oxygen  via nasal cannula.  Continuing to wean to room air.  Patient typically uses 2 to 3 L of oxygen  with exertion. Target oxygen  saturation to be 89 to 92% 5 days of intravenous antibiotics completed with Rocephin  and azithromycin  for community-acquired pneumonia Acute on chronic diastolic CHF (congestive heart failure) (HCC) Lasix  has been transitioned to oral therapy Clinically  nearing euvolemia Remainder of assessment and plan as above Iron  deficiency anemia due to chronic blood loss Secondary to presumed slow gastrointestinal bleeding from gastric malignancy Iron  panel consistent with iron  deficiency. Vitamin B12, folate unremarkable Providing patient with infusion of intravenous Venofer  followed by daily iron  supplementation Monitoring hemoglobin and hematocrit with serial CBCs Will transfuse if hemoglobin drops below 7. Essential hypertension Blood pressure is much better controlled on Cozaar , Coreg  and Aldactone . Type 2 diabetes mellitus (HCC) Patient been placed on Accu-Cheks before every meal and nightly with sliding scale insulin  Holding home regimen of hypoglycemics Hemoglobin A1C 5.9% Diabetic Diet  Hypercholesteremia Continue simvastatin  OSA (obstructive sleep apnea) CPAP nightly History of pulmonary embolism Diagnosed 2022 Now off Eliquis  due to GI bleeding Gastric adenocarcinoma (HCC) Dr. Cloretta with oncology following, his input is appreciated Newly diagnosed 8/1 CT being done 8/6 revealing concerning findings for metastatic nodal disease Close outpatient follow-up for rapid evaluation and treatment Thoracic aortic aneurysm without rupture [facility]) Outpatient follow-up with Dr. Lucas, stable at 4.8 cm Chronic kidney disease, stage 3a (HCC) Strict intake and output monitoring Creatinine near baseline Minimizing nephrotoxic agents as much as possible Serial chemistries to monitor renal function and electrolytes    Subjective:  Patient denies shortness of breath or chest pain.  Appetite is improving.  Strength is additionally improving.  Physical Exam:  Vitals:   07/23/24 2221 07/24/24 0500 07/24/24 0528 07/24/24 0809  BP:   (!) 178/93 (!) 106/54  Pulse:   (!) 58 61  Resp: 19  20   Temp:   98.6 F (37 C) 98.6 F (37 C)  TempSrc:    Oral  SpO2:   94% 95%  Weight:  ROLLEN)  138 kg    Height:        Constitutional: Awake alert  and oriented x3, no associated distress.   Skin: no rashes, no lesions, good skin turgor noted. Eyes: Pupils are equally reactive to light.  No evidence of scleral icterus or conjunctival pallor.  ENMT: Moist mucous membranes noted.  Posterior pharynx clear of any exudate or lesions.   Respiratory: Diminished breath sounds at the bases.  No evidence of wheezing. Normal respiratory effort. No accessory muscle use.  Cardiovascular: Regular rate and rhythm, no murmurs / rubs / gallops. No extremity edema. 2+ pedal pulses. No carotid bruits.  Abdomen: Abdomen is soft and nontender.  No evidence of intra-abdominal masses.  Positive bowel sounds noted in all quadrants.   Musculoskeletal: No joint deformity upper and lower extremities. Good ROM, no contractures. Normal muscle tone.    Data Reviewed:  I have personally reviewed and interpreted labs, imaging.  Significant findings are   CBC: Recent Labs  Lab 07/20/24 0554 07/21/24 0341 07/22/24 0328 07/23/24 0300 07/24/24 0617  WBC 6.7 7.5 7.4 6.8 7.3  NEUTROABS  --   --   --  4.0 4.5  HGB 8.1* 7.7* 7.7* 7.8* 8.0*  HCT 28.6* 27.1* 27.1* 27.8* 28.6*  MCV 78.6* 78.3* 77.7* 78.3* 79.0*  PLT 195 204 218 223 239   Basic Metabolic Panel: Recent Labs  Lab 07/20/24 0554 07/21/24 0341 07/22/24 0328 07/23/24 0300 07/24/24 0617  NA 133* 135 131* 136 135  K 3.8 3.7 3.6 4.1 4.1  CL 94* 95* 92* 96* 95*  CO2 29 29 28  32 30  GLUCOSE 108* 101* 101* 107* 104*  BUN 16 17 14 14 15   CREATININE 1.29* 1.13 1.05 1.38* 1.28*  CALCIUM 8.6* 8.7* 8.4* 8.5* 8.6*  MG  --   --   --  2.0 2.2    Telemetry: Personally reviewed.  Rhythm is sinus rhythm with heart rate of 89 bpm.   Code Status:  DNR.  Code status decision has been confirmed with: patient Family Communication: Daughter is at the bedside and has been updated on plan of care.   Severity of Illness:  The appropriate patient status for this patient is INPATIENT. Inpatient status is judged to  be reasonable and necessary in order to provide the required intensity of service to ensure the patient's safety. The patient's presenting symptoms, physical exam findings, and initial radiographic and laboratory data in the context of their chronic comorbidities is felt to place them at high risk for further clinical deterioration. Furthermore, it is not anticipated that the patient will be medically stable for discharge from the hospital within 2 midnights of admission.   * I certify that at the point of admission it is my clinical judgment that the patient will require inpatient hospital care spanning beyond 2 midnights from the point of admission due to high intensity of service, high risk for further deterioration and high frequency of surveillance required.*  Time spent:  35 minutes  Author:  Zachary JINNY Ba MD  07/24/2024 8:17 AM

## 2024-07-24 NOTE — Assessment & Plan Note (Signed)
 Patient been placed on Accu-Cheks before every meal and nightly with sliding scale insulin  Holding home regimen of hypoglycemics Hemoglobin A1C 5.9% Diabetic Diet

## 2024-07-24 NOTE — Assessment & Plan Note (Signed)
 Blood pressure is much better controlled on Cozaar , Coreg  and Aldactone .

## 2024-07-25 ENCOUNTER — Telehealth: Payer: Self-pay

## 2024-07-25 ENCOUNTER — Telehealth: Payer: Self-pay | Admitting: Student

## 2024-07-25 DIAGNOSIS — J9602 Acute respiratory failure with hypercapnia: Secondary | ICD-10-CM | POA: Diagnosis not present

## 2024-07-25 DIAGNOSIS — J9601 Acute respiratory failure with hypoxia: Secondary | ICD-10-CM | POA: Diagnosis not present

## 2024-07-25 DIAGNOSIS — D509 Iron deficiency anemia, unspecified: Secondary | ICD-10-CM | POA: Insufficient documentation

## 2024-07-25 LAB — GLUCOSE, CAPILLARY
Glucose-Capillary: 172 mg/dL — ABNORMAL HIGH (ref 70–99)
Glucose-Capillary: 106 mg/dL — ABNORMAL HIGH (ref 70–99)
Glucose-Capillary: 126 mg/dL — ABNORMAL HIGH (ref 70–99)
Glucose-Capillary: 129 mg/dL — ABNORMAL HIGH (ref 70–99)
Glucose-Capillary: 163 mg/dL — ABNORMAL HIGH (ref 70–99)

## 2024-07-25 NOTE — Assessment & Plan Note (Signed)
 Secondary to presumed slow gastrointestinal bleeding from gastric malignancy Iron  panel consistent with iron  deficiency. Vitamin B12, folate unremarkable Providing patient with infusion of intravenous Venofer  followed by daily iron  supplementation Monitoring hemoglobin and hematocrit with serial CBCs Will transfuse if hemoglobin drops below 7.

## 2024-07-25 NOTE — Assessment & Plan Note (Signed)
 Outpatient follow-up with Dr. Lucas, stable at 4.8 cm

## 2024-07-25 NOTE — Assessment & Plan Note (Signed)
 Strict intake and output monitoring Creatinine near baseline Minimizing nephrotoxic agents as much as possible Serial chemistries to monitor renal function and electrolytes

## 2024-07-25 NOTE — Assessment & Plan Note (Signed)
 Dr. Cloretta with oncology following, his input is appreciated Newly diagnosed 8/1 CT being done 8/6 revealing concerning findings for metastatic nodal disease Close outpatient follow-up for rapid evaluation and treatment

## 2024-07-25 NOTE — Assessment & Plan Note (Signed)
 Lasix  has been transitioned to oral therapy Clinically nearing euvolemia Remainder of assessment and plan as above

## 2024-07-25 NOTE — Assessment & Plan Note (Signed)
 Blood pressure is much better controlled on Cozaar , Coreg  and Aldactone .

## 2024-07-25 NOTE — Assessment & Plan Note (Signed)
 CPAP nightly

## 2024-07-25 NOTE — Telephone Encounter (Signed)
 Auth Submission: NO AUTH NEEDED Site of care: Site of care: [facility] Payer: [employer] medicare Medication & CPT/J Code(s) submitted: Venofer  (Iron  Sucrose) J1756 Diagnosis Code:  Route of submission (phone, fax, portal):  Phone # Fax # Auth type: Buy/Bill PB Units/visits requested: 200mg  x 4 doses Reference number:  Approval from: 07/25/24 to 11/24/24

## 2024-07-25 NOTE — Assessment & Plan Note (Signed)
 Continue simvastatin .

## 2024-07-25 NOTE — Progress Notes (Signed)
 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   Upon evaluation in the emergency department patient was noted to be suffering from acute hypoxic and hypercapnic respiratory failure secondary to left lower lobe pneumonia.  Requiring BiPAP.  Hospitalist was called to assess the patient for admission to hospital and was admitted to the stepdown unit.    Patient was placed on intravenous antibiotics with ceftriaxone  and azithromycin .  Blood cultures were obtained and were found to be unremarkable.  In the days that followed patient was weaned off BiPAP and transitioned to high flow nasal cannula.   Assessment & Plan Acute respiratory failure with hypoxia and hypercapnia (HCC) Community-acquired pneumonia secondary to left lower lobe pneumonia in the setting of obesity hypoventilation syndrome with superimposed acute on chronic diastolic congestive heart failure. Patient has been weaned to 2 L of oxygen  via nasal cannula.  Continuing to wean to room air.  Patient typically uses 2 to 3 L of oxygen  with exertion. Target oxygen  saturation to be 89 to 92% 5 days of intravenous antibiotics completed with Rocephin  and azithromycin  for community-acquired pneumonia Acute on chronic diastolic CHF (congestive heart failure) (HCC) Lasix  has been transitioned to oral therapy Clinically  nearing euvolemia Remainder of assessment and plan as above Iron  deficiency anemia due to chronic blood loss Secondary to presumed slow gastrointestinal bleeding from gastric malignancy Iron  panel consistent with iron  deficiency. Vitamin B12, folate unremarkable Providing patient with infusion of intravenous Venofer  followed by daily iron  supplementation Monitoring hemoglobin and hematocrit with serial CBCs Will transfuse if hemoglobin drops below 7. Essential hypertension Blood pressure is much better controlled on Cozaar , Coreg  and Aldactone . Type 2 diabetes mellitus (HCC) Patient been placed on Accu-Cheks before every meal and nightly with sliding scale insulin  Holding home regimen of hypoglycemics Hemoglobin A1C 5.9% Diabetic Diet  Hypercholesteremia Continue simvastatin  OSA (obstructive sleep apnea) CPAP nightly History of pulmonary embolism Diagnosed 2022 Now off Eliquis  due to GI bleeding Gastric adenocarcinoma (HCC) Dr. Cloretta with oncology following, his input is appreciated Newly diagnosed 8/1 CT being done 8/6 revealing concerning findings for metastatic nodal disease Close outpatient follow-up for rapid evaluation and treatment Thoracic aortic aneurysm without rupture [facility]) Outpatient follow-up with Dr. Lucas, stable at 4.8 cm Chronic kidney disease, stage 3a (HCC) Strict intake and output monitoring Creatinine near baseline Minimizing nephrotoxic agents as much as possible Serial chemistries to monitor renal function and electrolytes    Subjective:  Patient was seen and examined at bedside in the morning.  Patient was sitting already on the recliner, using supplemental O2 halogen.  Denies any worsening of shortness of breath, no any other active issues. Patient is awaiting for SNF placement, he prefers to go to [facility].   Physical Exam:  Vitals:   07/24/24 1953 07/25/24 0454 07/25/24 0932 07/25/24 1335  BP: 116/61 134/70  125/73  Pulse: (!) 57 (!)  58  (!) 57  Resp: 20 18  16   Temp: 98 F (36.7 C) 98.7 F (37.1 C)  98.3 F (36.8 C)  TempSrc: Oral Oral  Oral  SpO2: 95% 93% 93% 95%  Weight:  (!) 139 kg    Height:        Constitutional: Awake alert and oriented x3, no associated distress.   Skin: no rashes, no lesions, good skin turgor noted. Eyes: Pupils are equally reactive to light.  No evidence of scleral icterus or conjunctival pallor.  ENMT: Moist mucous membranes noted.  Posterior pharynx clear of any exudate or lesions.   Respiratory: Diminished breath sounds at the bases.  No evidence of wheezing. Normal respiratory effort. No accessory muscle use.  Cardiovascular: Regular rate and rhythm, no murmurs / rubs / gallops. 2-3+ edema on the dorsum of feet,   Abdomen: Abdomen is soft and nontender.  No evidence of intra-abdominal masses.  Positive bowel sounds noted in all quadrants.   Musculoskeletal: No joint deformity upper and lower extremities. Good ROM, no contractures. Normal muscle tone.    Data Reviewed:  I have personally reviewed and interpreted labs, imaging.  Significant findings are   CBC: Recent Labs  Lab 07/20/24 0554 07/21/24 0341 07/22/24 0328 07/23/24 0300 07/24/24 0617  WBC 6.7 7.5 7.4 6.8 7.3  NEUTROABS  --   --   --  4.0 4.5  HGB 8.1* 7.7* 7.7* 7.8* 8.0*  HCT 28.6* 27.1* 27.1* 27.8* 28.6*  MCV 78.6* 78.3* 77.7* 78.3* 79.0*  PLT 195 204 218 223 239   Basic Metabolic Panel: Recent Labs  Lab 07/20/24 0554 07/21/24 0341 07/22/24 0328 07/23/24 0300 07/24/24 0617  NA 133* 135 131* 136 135  K 3.8 3.7 3.6 4.1 4.1  CL 94* 95* 92* 96* 95*  CO2 29 29 28  32 30  GLUCOSE 108* 101* 101* 107* 104*  BUN 16 17 14 14 15   CREATININE 1.29* 1.13 1.05 1.38* 1.28*  CALCIUM 8.6* 8.7* 8.4* 8.5* 8.6*  MG  --   --   --  2.0 2.2    Telemetry: Personally reviewed.  Rhythm is sinus rhythm with heart rate of 89 bpm.   Code Status:  DNR.  Code status decision has been confirmed with: patient Family  Communication: Daughter is at the bedside and has been updated on plan of care.   Severity of Illness:  The appropriate patient status for this patient is INPATIENT. Inpatient status is judged to be reasonable and necessary in order to provide the required intensity of service to ensure the patient's safety. The patient's presenting symptoms, physical exam findings, and initial radiographic and laboratory data in the context of their chronic comorbidities is felt to place them at high risk for further clinical deterioration. Furthermore, it is not anticipated that the patient will be medically stable for discharge from the hospital within 2 midnights of admission.   * I certify that at the point of admission it is my clinical judgment that the patient will require inpatient hospital care spanning beyond 2 midnights from the point of admission due to high intensity of service, high risk for further deterioration and high frequency of surveillance required.*  Time spent:  35 minutes  Author:  Elvan Sor MD  07/25/2024 2:31 PM

## 2024-07-25 NOTE — Assessment & Plan Note (Signed)
 Patient been placed on Accu-Cheks before every meal and nightly with sliding scale insulin  Holding home regimen of hypoglycemics Hemoglobin A1C 5.9% Diabetic Diet

## 2024-07-25 NOTE — Assessment & Plan Note (Signed)
 Community-acquired pneumonia secondary to left lower lobe pneumonia in the setting of obesity hypoventilation syndrome with superimposed acute on chronic diastolic congestive heart failure. Patient has been weaned to 2 L of oxygen  via nasal cannula.  Continuing to wean to room air.  Patient typically uses 2 to 3 L of oxygen  with exertion. Target oxygen  saturation to be 89 to 92% 5 days of intravenous antibiotics completed with Rocephin  and azithromycin  for community-acquired pneumonia

## 2024-07-25 NOTE — Plan of Care (Signed)

## 2024-07-25 NOTE — Progress Notes (Signed)
 Occupational Therapy Treatment Patient Details Name: Bradley Alvarez MRN: [id number] DOB: May 25, 1941 Today's Date: 07/25/2024   History of present illness 83 year old male PMH  presented 07/14/24  with shortness of breath.  Admitted for acute hypoxic and hypercapnic respiratory failure secondary to left lower lobe pneumonia, requiring BiPAP. 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   OT comments  Pt making good progress with functional goals. Pt demos safe use of 4WRW (upright RW) during ADL mobility tasks. Pt states that he is eager to d/c to [facility] rehab facility. OT will continue to follow acutely      If plan is discharge home, recommend the following:  A lot of help with bathing/dressing/bathroom;Assistance with cooking/housework;Assist for transportation;Help with stairs or ramp for entrance   Equipment Recommendations  Other (comment) (defer)    Recommendations for Other Services      Precautions / Restrictions Precautions Precautions: Fall Precaution/Restrictions Comments: on HHFNC, monitor vitals Restrictions Weight Bearing Restrictions Per Provider Order: No       Mobility Bed Mobility               General bed mobility comments: pt in chair upon OT arrival    Transfers Overall transfer level: Needs assistance Equipment used: Rollator (4 wheels) (upright RW) Transfers: Sit to/from Stand Sit to Stand: Contact guard assist                 Balance Overall balance assessment: Needs assistance Sitting-balance support: Bilateral upper extremity supported, Feet supported Sitting balance-Leahy Scale: Good     Standing balance support: Bilateral upper extremity supported, During functional activity, Reliant on assistive device for balance Standing balance-Leahy Scale: Fair                             ADL either performed or assessed with  clinical judgement   ADL Overall ADL's : Needs assistance/impaired     Grooming: Wash/dry hands;Wash/dry face;Contact guard assist;Standing           Upper Body Dressing : Set up;Sitting   Lower Body Dressing: Moderate assistance;Minimal assistance;Sitting/lateral leans;Sit to/from stand   Toilet Transfer: Tree surgeon (4 wheels)   Toileting- Architect and Hygiene: Sit to/from stand       Functional mobility during ADLs: Contact guard assist;Rollator (4 wheels) General ADL Comments: pt with good safey with RW brakes    Extremity/Trunk Assessment Upper Extremity Assessment Upper Extremity Assessment: Overall WFL for tasks assessed   Lower Extremity Assessment Lower Extremity Assessment: Defer to PT evaluation   Cervical / Trunk Assessment Cervical / Trunk Assessment: Normal    Vision Baseline Vision/History: 1 Wears glasses Ability to See in Adequate Light: 0 Adequate Patient Visual Report: No change from baseline     Perception     Praxis     Communication Communication Communication: No apparent difficulties   Cognition Arousal: Alert Behavior During Therapy: WFL for tasks assessed/performed Cognition: No apparent impairments                               Following commands: Intact        Cueing   Cueing Techniques: Verbal cues  Exercises      Shoulder Instructions       General Comments      Pertinent Vitals/ Pain  Pain Assessment Pain Assessment: No/denies pain  Home Living                                          Prior Functioning/Environment              Frequency  Min 2X/week        Progress Toward Goals  OT Goals(current goals can now be found in the care plan section)  Progress towards OT goals: Progressing toward goals     Plan      Co-evaluation                 AM-PAC OT 6 Clicks Daily Activity     Outcome Measure   Help  from another person eating meals?: None Help from another person taking care of personal grooming?: A Little Help from another person toileting, which includes using toliet, bedpan, or urinal?: A Little Help from another person bathing (including washing, rinsing, drying)?: A Lot Help from another person to put on and taking off regular upper body clothing?: A Little Help from another person to put on and taking off regular lower body clothing?: A Lot 6 Click Score: 17    End of Session Equipment Utilized During Treatment: Oxygen ;Gait belt;Rollator (4 wheels)  OT Visit Diagnosis: Muscle weakness (generalized) (M62.81);Other abnormalities of gait and mobility (R26.89)   Activity Tolerance Patient tolerated treatment well   Patient Left with call bell/phone within reach;in chair   Nurse Communication Mobility status        Time: 8786-8761 OT Time Calculation (min): 25 min  Charges: OT General Charges $OT Visit: 1 Visit OT Treatments $Self Care/Home Management : 8-22 mins $Therapeutic Activity: 8-22 mins   Jacques Karna Loose 07/25/2024, 12:48 PM

## 2024-07-25 NOTE — Assessment & Plan Note (Signed)
 Diagnosed 2022 Now off Eliquis  due to GI bleeding

## 2024-07-25 NOTE — Telephone Encounter (Signed)
 Patient referred to infusion pharmacy team for ambulatory infusion of IV iron .  Insurance - Advertising copywriter of care - Site of care: [facility] Dx code - D50.0/D50.9  IV Iron  Therapy - Venofer  200 mg IV x 4. Patient received Venofer  200 mg Iv x 1 on 8/24 inpatient.  Infusion appointments - Scheduling team will schedule patient as soon as possible.    Sharrieff Spratlin D. Enmanuel Zufall, PharmD

## 2024-07-26 DIAGNOSIS — J9602 Acute respiratory failure with hypercapnia: Secondary | ICD-10-CM | POA: Diagnosis not present

## 2024-07-26 DIAGNOSIS — J9601 Acute respiratory failure with hypoxia: Secondary | ICD-10-CM | POA: Diagnosis not present

## 2024-07-26 LAB — GLUCOSE, CAPILLARY
Glucose-Capillary: 95 mg/dL (ref 70–99)
Glucose-Capillary: 123 mg/dL — ABNORMAL HIGH (ref 70–99)
Glucose-Capillary: 176 mg/dL — ABNORMAL HIGH (ref 70–99)
Glucose-Capillary: 116 mg/dL — ABNORMAL HIGH (ref 70–99)

## 2024-07-26 LAB — CBC
HCT: 27.3 % — ABNORMAL LOW (ref 39.0–52.0)
Hemoglobin: 7.8 g/dL — ABNORMAL LOW (ref 13.0–17.0)
MCH: 22.2 pg — ABNORMAL LOW (ref 26.0–34.0)
MCHC: 28.6 g/dL — ABNORMAL LOW (ref 30.0–36.0)
MCV: 77.8 fL — ABNORMAL LOW (ref 80.0–100.0)
Platelets: 231 K/uL (ref 150–400)
RBC: 3.51 MIL/uL — ABNORMAL LOW (ref 4.22–5.81)
RDW: 20.7 % — ABNORMAL HIGH (ref 11.5–15.5)
WBC: 7.4 K/uL (ref 4.0–10.5)
nRBC: 0 % (ref 0.0–0.2)

## 2024-07-26 LAB — BASIC METABOLIC PANEL WITH GFR
Anion gap: 9 (ref 5–15)
BUN: 15 mg/dL (ref 8–23)
CO2: 29 mmol/L (ref 22–32)
Calcium: 8.4 mg/dL — ABNORMAL LOW (ref 8.9–10.3)
Chloride: 98 mmol/L (ref 98–111)
Creatinine, Ser: 1.27 mg/dL — ABNORMAL HIGH (ref 0.61–1.24)
GFR, Estimated: 56 mL/min — ABNORMAL LOW (ref >60)
Glucose, Bld: 93 mg/dL (ref 70–99)
Potassium: 3.9 mmol/L (ref 3.5–5.1)
Sodium: 136 mmol/L (ref 135–145)

## 2024-07-26 LAB — MAGNESIUM: Magnesium: 2.1 mg/dL (ref 1.7–2.4)

## 2024-07-26 LAB — PHOSPHORUS: Phosphorus: 3.6 mg/dL (ref 2.5–4.6)

## 2024-07-26 NOTE — Assessment & Plan Note (Signed)
 CPAP nightly

## 2024-07-26 NOTE — Plan of Care (Signed)
  Problem: Clinical Measurements: Goal: Ability to maintain clinical measurements within normal limits will improve Outcome: Progressing   Problem: Nutrition: Goal: Adequate nutrition will be maintained Outcome: Progressing   Problem: Coping: Goal: Level of anxiety will decrease Outcome: Progressing   Problem: Elimination: Goal: Will not experience complications related to bowel motility Outcome: Progressing

## 2024-07-26 NOTE — Plan of Care (Signed)
   Problem: Education: Goal: Knowledge of General Education information will improve Description Including pain rating scale, medication(s)/side effects and non-pharmacologic comfort measures Outcome: Progressing

## 2024-07-26 NOTE — Progress Notes (Signed)
 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   Upon evaluation in the emergency department patient was noted to be suffering from acute hypoxic and hypercapnic respiratory failure secondary to left lower lobe pneumonia.  Requiring BiPAP.  Hospitalist was called to assess the patient for admission to hospital and was admitted to the stepdown unit.    Patient was placed on intravenous antibiotics with ceftriaxone  and azithromycin .  Blood cultures were obtained and were found to be unremarkable.  In the days that followed patient was weaned off BiPAP and transitioned to high flow nasal cannula.  8/26 stable to DC, awaiting for SNF placement.  Assessment & Plan Acute respiratory failure with hypoxia and hypercapnia (HCC) Community-acquired pneumonia secondary to left lower lobe pneumonia in the setting of obesity hypoventilation syndrome with superimposed acute on chronic diastolic congestive heart failure. Patient has been weaned to 2 L of oxygen  via nasal cannula.  Continuing to wean to room air.  Patient typically uses 2 to 3 L of oxygen  with exertion. Target oxygen  saturation to be 89 to 92% 5 days of intravenous antibiotics completed with Rocephin  and azithromycin  for community-acquired pneumonia Acute on chronic diastolic CHF (congestive heart failure) (HCC) Lasix  has  been transitioned to oral therapy Clinically nearing euvolemia Remainder of assessment and plan as above Iron  deficiency anemia due to chronic blood loss Secondary to presumed slow gastrointestinal bleeding from gastric malignancy Iron  panel consistent with iron  deficiency. Vitamin B12, folate unremarkable Providing patient with infusion of intravenous Venofer  followed by daily iron  supplementation Monitoring hemoglobin and hematocrit with serial CBCs Will transfuse if hemoglobin drops below 7. Essential hypertension Blood pressure is much better controlled on Cozaar , Coreg  and Aldactone . Type 2 diabetes mellitus (HCC) Patient been placed on Accu-Cheks before every meal and nightly with sliding scale insulin  Holding home regimen of hypoglycemics Hemoglobin A1C 5.9% Diabetic Diet  Hypercholesteremia Continue simvastatin  OSA (obstructive sleep apnea) CPAP nightly History of pulmonary embolism Diagnosed 2022 Now off Eliquis  due to GI bleeding Gastric adenocarcinoma (HCC) Dr. Cloretta with oncology following, his input is appreciated Newly diagnosed 8/1 CT being done 8/6 revealing concerning findings for metastatic nodal disease Close outpatient follow-up for rapid evaluation and treatment Thoracic aortic aneurysm without rupture [facility]) Outpatient follow-up with Dr. Lucas, stable at 4.8 cm Chronic kidney disease, stage 3a (HCC) Strict intake and output monitoring Creatinine near baseline Minimizing nephrotoxic agents as much as possible Serial chemistries to monitor renal function and electrolytes    Subjective:  Patient was seen and examined at bedside in the morning.  Patient was sitting comfortably on the recliner, slept well at nighttime, using CPAP.  Patient denied any worsening of shortness of breath, no any other active issues.  Awaiting for SNF placement.    Physical Exam:  Vitals:   07/26/24 0511 07/26/24 0836 07/26/24 0838 07/26/24 1252  BP: (!) 141/67   128/63   Pulse: (!) 55   ROLLEN)  54  Resp: 19   (!) 21  Temp: 98.8 F (37.1 C)   98.1 F (36.7 C)  TempSrc: Oral   Oral  SpO2: 94% 93% 93% 93%  Weight: 134.3 kg     Height:        Constitutional: Awake alert and oriented x3, no associated distress.   Skin: no rashes, no lesions, good skin turgor noted. Eyes: Pupils are equally reactive to light.  No evidence of scleral icterus or conjunctival pallor.  ENMT: Moist mucous membranes noted.  Posterior pharynx clear of any exudate or lesions.   Respiratory: Diminished breath sounds at the bases.  No evidence of wheezing. Normal respiratory effort. No accessory muscle use.  Cardiovascular: Regular rate and rhythm, no murmurs / rubs / gallops. 2-3+ edema on the dorsum of feet,   Abdomen: Abdomen is soft and nontender.  No evidence of intra-abdominal masses.  Positive bowel sounds noted in all quadrants.   Musculoskeletal: No joint deformity upper and lower extremities. Good ROM, no contractures. Normal muscle tone.    Data Reviewed:  I have personally reviewed and interpreted labs, imaging.  Significant findings are   CBC: Recent Labs  Lab 07/21/24 0341 07/22/24 0328 07/23/24 0300 07/24/24 0617 07/26/24 0856  WBC 7.5 7.4 6.8 7.3 7.4  NEUTROABS  --   --  4.0 4.5  --   HGB 7.7* 7.7* 7.8* 8.0* 7.8*  HCT 27.1* 27.1* 27.8* 28.6* 27.3*  MCV 78.3* 77.7* 78.3* 79.0* 77.8*  PLT 204 218 223 239 231   Basic Metabolic Panel: Recent Labs  Lab 07/21/24 0341 07/22/24 0328 07/23/24 0300 07/24/24 0617 07/26/24 0856  NA 135 131* 136 135 136  K 3.7 3.6 4.1 4.1 3.9  CL 95* 92* 96* 95* 98  CO2 29 28 32 30 29  GLUCOSE 101* 101* 107* 104* 93  BUN 17 14 14 15 15   CREATININE 1.13 1.05 1.38* 1.28* 1.27*  CALCIUM 8.7* 8.4* 8.5* 8.6* 8.4*  MG  --   --  2.0 2.2 2.1  PHOS  --   --   --   --  3.6     Code Status:  DNR.  Code status decision has been confirmed with: patient Family Communication: Daughter is at the bedside and has been updated on plan  of care.  Disposition plan: Clinically stable, medically optimized to discharge.  Awaiting for SNF placement. 8/26 As per [facility] patient can go to [facility] today and then she texted me that bed is not available, most likely tomorrow a.m.    Severity of Illness:  The appropriate patient status for this patient is INPATIENT. Inpatient status is judged to be reasonable and necessary in order to provide the required intensity of service to ensure the patient's safety. The patient's presenting symptoms, physical exam findings, and initial radiographic and laboratory data in the context of their chronic comorbidities is felt to place them at high risk for further clinical deterioration. Furthermore, it is not anticipated that the patient will be medically stable for discharge from the hospital within 2 midnights of admission.   * I certify that at the point of admission it is my clinical judgment that the patient will require inpatient hospital care spanning beyond 2 midnights from the point of admission due to high intensity of service, high risk for further deterioration and high frequency of surveillance required.*  Time spent:  35 minutes  Author:  Elvan Sor MD  07/26/2024 1:46 PM

## 2024-07-26 NOTE — Assessment & Plan Note (Signed)
 Lasix  has been transitioned to oral therapy Clinically nearing euvolemia Remainder of assessment and plan as above

## 2024-07-26 NOTE — Progress Notes (Signed)
Pt wearing home CPAP.

## 2024-07-26 NOTE — Assessment & Plan Note (Signed)
 Dr. Cloretta with oncology following, his input is appreciated Newly diagnosed 8/1 CT being done 8/6 revealing concerning findings for metastatic nodal disease Close outpatient follow-up for rapid evaluation and treatment

## 2024-07-26 NOTE — Assessment & Plan Note (Signed)
 Continue simvastatin .

## 2024-07-26 NOTE — Assessment & Plan Note (Signed)
 Diagnosed 2022 Now off Eliquis  due to GI bleeding

## 2024-07-26 NOTE — Assessment & Plan Note (Signed)
 Strict intake and output monitoring Creatinine near baseline Minimizing nephrotoxic agents as much as possible Serial chemistries to monitor renal function and electrolytes

## 2024-07-26 NOTE — Assessment & Plan Note (Signed)
 Community-acquired pneumonia secondary to left lower lobe pneumonia in the setting of obesity hypoventilation syndrome with superimposed acute on chronic diastolic congestive heart failure. Patient has been weaned to 2 L of oxygen  via nasal cannula.  Continuing to wean to room air.  Patient typically uses 2 to 3 L of oxygen  with exertion. Target oxygen  saturation to be 89 to 92% 5 days of intravenous antibiotics completed with Rocephin  and azithromycin  for community-acquired pneumonia

## 2024-07-26 NOTE — Plan of Care (Signed)

## 2024-07-26 NOTE — Assessment & Plan Note (Signed)
 Secondary to presumed slow gastrointestinal bleeding from gastric malignancy Iron  panel consistent with iron  deficiency. Vitamin B12, folate unremarkable Providing patient with infusion of intravenous Venofer  followed by daily iron  supplementation Monitoring hemoglobin and hematocrit with serial CBCs Will transfuse if hemoglobin drops below 7.

## 2024-07-26 NOTE — Assessment & Plan Note (Signed)
 Patient been placed on Accu-Cheks before every meal and nightly with sliding scale insulin  Holding home regimen of hypoglycemics Hemoglobin A1C 5.9% Diabetic Diet

## 2024-07-26 NOTE — Assessment & Plan Note (Signed)
 Outpatient follow-up with Dr. Lucas, stable at 4.8 cm

## 2024-07-26 NOTE — Assessment & Plan Note (Signed)
 Blood pressure is much better controlled on Cozaar , Coreg  and Aldactone .

## 2024-07-26 NOTE — TOC Progression Note (Signed)
 Transition of Care [facility]) - Progression Note    Patient Details  Name: Bradley Alvarez MRN: [id number] Date of Birth: 09/09/1941  [facility]) CM/SW Contact  Sheri ONEIDA [place], [place] Phone Number: 07/26/2024, 10:28 AM  Clinical Narrative:    Ins auth approved. CSW confirmed w SNF that bed will be available Wed 8/27. Plan to dc pt to [facility] place tomorrow.    Barriers to Discharge: Continued Medical Work up               Expected Discharge Plan and Services In-house Referral: NA Discharge Planning Services: NA   Living arrangements for the past 2 months: Single Family Home                 DME Arranged: N/A DME Agency: NA       HH Arranged: NA HH Agency: NA         Social Drivers of Health (SDOH) Interventions SDOH Screenings   Food Insecurity: No Food Insecurity (07/14/2024)  Housing: Low Risk  (07/14/2024)  Transportation Needs: No Transportation Needs (07/14/2024)  Utilities: Not At Risk (07/14/2024)  Alcohol  Screen: Low Risk  (07/12/2024)  Depression (PHQ2-9): Low Risk  (07/12/2024)  Financial Resource Strain: Low Risk  (07/12/2024)  Physical Activity: Inactive (07/12/2024)  Social Connections: Socially Integrated (07/14/2024)  Stress: No Stress Concern Present (07/12/2024)  Tobacco Use: Medium Risk (07/14/2024)  Health Literacy: Adequate Health Literacy (07/12/2024)    Readmission Risk Interventions    07/16/2024    9:18 AM  Readmission Risk Prevention Plan  Transportation Screening Complete  PCP or Specialist Appt within 3-5 Days Complete  HRI or Home Care Consult Complete  Social Work Consult for Recovery Care Planning/Counseling Complete  Palliative Care Screening Not Applicable  Medication Review Oceanographer) Complete

## 2024-07-26 NOTE — Progress Notes (Signed)
 PT Cancellation Note  Patient Details Name: Bradley Alvarez MRN: [id number] DOB: [birth year]   Cancelled Treatment:    Reason Eval/Treat Not Completed:  Attempted PT tx session this am-pt politely declined participation-preparing to d/c later today.    Dannial SQUIBB, PT Acute Rehabilitation  Office: [phone]

## 2024-07-27 DIAGNOSIS — J9601 Acute respiratory failure with hypoxia: Secondary | ICD-10-CM | POA: Diagnosis not present

## 2024-07-27 DIAGNOSIS — D500 Iron deficiency anemia secondary to blood loss (chronic): Secondary | ICD-10-CM | POA: Diagnosis not present

## 2024-07-27 DIAGNOSIS — M6281 Muscle weakness (generalized): Secondary | ICD-10-CM | POA: Diagnosis not present

## 2024-07-27 DIAGNOSIS — Z86711 Personal history of pulmonary embolism: Secondary | ICD-10-CM | POA: Diagnosis not present

## 2024-07-27 DIAGNOSIS — C162 Malignant neoplasm of body of stomach: Secondary | ICD-10-CM | POA: Diagnosis not present

## 2024-07-27 DIAGNOSIS — N1831 Chronic kidney disease, stage 3a: Secondary | ICD-10-CM | POA: Diagnosis not present

## 2024-07-27 DIAGNOSIS — E7800 Pure hypercholesterolemia, unspecified: Secondary | ICD-10-CM

## 2024-07-27 DIAGNOSIS — I7121 Aneurysm of the ascending aorta, without rupture: Secondary | ICD-10-CM | POA: Diagnosis not present

## 2024-07-27 DIAGNOSIS — I5031 Acute diastolic (congestive) heart failure: Secondary | ICD-10-CM | POA: Diagnosis not present

## 2024-07-27 DIAGNOSIS — K219 Gastro-esophageal reflux disease without esophagitis: Secondary | ICD-10-CM | POA: Diagnosis not present

## 2024-07-27 DIAGNOSIS — G4733 Obstructive sleep apnea (adult) (pediatric): Secondary | ICD-10-CM | POA: Diagnosis not present

## 2024-07-27 DIAGNOSIS — C169 Malignant neoplasm of stomach, unspecified: Secondary | ICD-10-CM | POA: Diagnosis not present

## 2024-07-27 DIAGNOSIS — I10 Essential (primary) hypertension: Secondary | ICD-10-CM | POA: Diagnosis not present

## 2024-07-27 DIAGNOSIS — E119 Type 2 diabetes mellitus without complications: Secondary | ICD-10-CM | POA: Diagnosis not present

## 2024-07-27 DIAGNOSIS — I5033 Acute on chronic diastolic (congestive) heart failure: Secondary | ICD-10-CM | POA: Diagnosis not present

## 2024-07-27 DIAGNOSIS — R2689 Other abnormalities of gait and mobility: Secondary | ICD-10-CM | POA: Diagnosis not present

## 2024-07-27 DIAGNOSIS — F4321 Adjustment disorder with depressed mood: Secondary | ICD-10-CM | POA: Diagnosis not present

## 2024-07-27 DIAGNOSIS — J45909 Unspecified asthma, uncomplicated: Secondary | ICD-10-CM | POA: Diagnosis not present

## 2024-07-27 DIAGNOSIS — J181 Lobar pneumonia, unspecified organism: Secondary | ICD-10-CM | POA: Diagnosis not present

## 2024-07-27 LAB — GLUCOSE, CAPILLARY
Glucose-Capillary: 92 mg/dL (ref 70–99)
Glucose-Capillary: 130 mg/dL — ABNORMAL HIGH (ref 70–99)

## 2024-07-27 MED ORDER — LIDOCAINE 5 % EX PTCH
1.0000 | MEDICATED_PATCH | CUTANEOUS | 0 refills | Status: AC
Start: 2024-07-28 — End: ?

## 2024-07-27 MED ORDER — NYSTATIN 100000 UNIT/ML MT SUSP: 5.0000 mL | Freq: Four times a day (QID) | OROMUCOSAL | Status: AC

## 2024-07-27 MED ORDER — HYDROCODONE-ACETAMINOPHEN 5-325 MG PO TABS: 1.0000 | ORAL_TABLET | Freq: Two times a day (BID) | ORAL | 0 refills | Status: AC | PRN

## 2024-07-27 MED ORDER — SENNA 8.6 MG PO TABS: 2.0000 | ORAL_TABLET | Freq: Every day | ORAL | Status: AC

## 2024-07-27 MED ORDER — POLYETHYLENE GLYCOL 3350 17 G PO PACK: 17.0000 g | PACK | Freq: Two times a day (BID) | ORAL | Status: AC

## 2024-07-27 NOTE — Plan of Care (Signed)

## 2024-07-27 NOTE — Discharge Summary (Signed)
 Physician Discharge Summary  AC COLAN FMW:[id number] DOB: [birth year] DOA: 07/14/2024  PCP: [name], MD  Admit date: 07/14/2024 Discharge date: 07/27/2024  Time spent: 60 minutes  Recommendations for Outpatient Follow-up:  Follow-up with MD at skilled nursing facility.  Patient will need a basic metabolic profile and a CBC done in 1 week to follow-up on electrolytes, renal function, hemoglobin. Follow-up with Dr. Cloretta, hematology/oncology in 1 to 2 weeks or as scheduled.   Discharge Diagnoses:  Principal Problem:   Acute respiratory failure with hypoxia and hypercapnia (HCC) Active Problems:   Essential hypertension   Type 2 diabetes mellitus (HCC)   Hypercholesteremia   OSA (obstructive sleep apnea)   Thoracic aortic aneurysm without rupture (HCC)   Acute on chronic diastolic CHF (congestive heart failure) (HCC)   History of pulmonary embolism   Asthma   Lobar pneumonia (HCC)   Gastric adenocarcinoma (HCC)   Chronic kidney disease, stage 3a (HCC)   Iron  deficiency anemia due to chronic blood loss   Discharge Condition: Stable and improved.  Diet recommendation: Heart healthy  Filed Weights   07/25/24 0454 07/26/24 0511 07/27/24 0738  Weight: (!) 139 kg 134.3 kg (!) 137.5 kg    History of present illness:  HPI per Dr. Tobie [name] is a 83 y.o. male with medical history significant for recently diagnosed gastric adenocarcinoma with large fungating and ulcerated mass, chronic HFpEF, history of PE now off Eliquis  due to GI bleed, iron  deficiency anemia due to chronic blood loss, T2DM, HTN, CKD stage IIIa, HLD, asthma, ascending aortic aneurysm, OSA on CPAP who presented to the ED for evaluation of shortness of breath.   Patient recently diagnosed with gastric cancer by EGD on 8/1 which showed a large fungating and ulcerated mass at the lower curvature of the stomach.  Pathology consistent with invasive moderately differentiated adenocarcinoma.  He is  following with oncology and has a PET scan scheduled for 8/19.   Patient states for the last 2 weeks he has been progressively short of breath with cough occasionally productive of dark sputum.  Today his dyspnea significantly worsened.  His spouse checked his home oxygen  level and saw that it was in the 70s.  Patient went to the ED for further evaluation.  Per ED triage documentation SpO2 was 75% when he got in the room and he was placed on NRB.   On transfer to stepdown unit, SpO2 was 86% while on 15 L NRB.  He was subsequently placed on BiPAP with improvement.   Patient is feeling better while on BiPAP.  He denies recent fevers, chills, diaphoresis, chest pain.  He reports chronic lower extremity swelling which is improved from baseline.  He says he was recently taken off Lasix  and his spironolactone  was cut in half due to low blood pressures.   [facility] ED Course  Labs/Imaging on admission: I have personally reviewed following labs and imaging studies.   Initial vitals showed BP 136/75, pulse 73, RR 21, temp 90.6 F, SpO2 95% on 15 L NRB 10   Labs showed WBC 8.3, hemoglobin 8.9, platelets 233, sodium 138, potassium 4.1, bicarb 25, 12.21.40, serum glucose 127, proBNP 237.   ABG showed pH 7.25, PCO254, PO266.  SARS-CoV-2, follicularly PCR negative.   CTA chest negative for acute PE to the level of the proximal segmental pulmonary arteries.  New peripheral left lower lobe consolidation suspicious for pneumonia.  Mild interlobular septal thickening with scattered peribronchovascular groundglass opacities.  Dilated main pulmonary  artery measuring 4.5 cm.  Unchanged mediastinal and right hilar adenopathy, likely reactive.  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   Patient was given IV ceftriaxone  and azithromycin .  The hospitalist service was consulted to admit.   Hospital Course:  #1 acute respiratory failure with hypoxia and hypercapnia -Patient to presented with  acute respiratory failure with hypoxia and hypercapnia. - CT angiogram chest done was negative for PE however concerning for left lower lobe pneumonia in the setting of obesity hypoventilation syndrome and concern for superimposed acute on chronic diastolic CHF. - Patient placed empirically on IV antibiotics and completed 5-day course of antibiotic treatment. -Patient also was treated with bronchodilators as well as Mucinex , PPI. - O2 requirements improved and patient was weaned to room air and also 2 to 3 L O2 with exertion with target oxygenation noted to be between 89 to 92%. - Patient improved clinically and be discharged in stable and improved condition.  2.  Acute on chronic diastolic CHF -Patient initially diuresed with IV Lasix  and has improved clinically was transition back to home regimen of spironolactone . - Patient also maintained on home regimen Coreg , Cozaar , BiDil , Zocor . - Patient improved clinically and be discharged in stable condition.  3.  Iron  deficiency anemia due to chronic blood loss -Anemia felt secondary to presumed slow GI bleeding from gastric malignancy. - Anemia panel done consistent with iron  deficiency anemia. - Vitamin B12, folate levels were unremarkable. - Patient received IV Venofer  during the hospitalization followed by daily oral iron  supplementation. - Outpatient follow-up.  4.  Hypertension -Patient maintained on home regimen Cozaar , Coreg , BiDil , spironolactone . - Outpatient follow-up.  5.  Well-controlled diabetes mellitus type 2 -Hemoglobin A1c noted at 5.9. - Patient's oral hypoglycemic agents were held during the hospitalization and patient maintained on sliding scale insulin . - Outpatient follow-up.  6.  Hyperlipidemia -Patient maintained on statin.  7.  OSA -Patient maintained on CPAP nightly.  8.  History of PE -Diagnosed in 2022. - Patient was on Eliquis  which has been discontinued due to history of GI bleed.  9.  Gastric  adenocarcinoma -Recently diagnosed 07/01/2024. - CT done 07/06/2024 revealing concerning findings for metastatic nodal disease - Patient seen by oncology, Dr. Cloretta during the hospitalization. - Outpatient follow-up with oncology.  10.  Thoracic aortic aneurysm without rupture -Outpatient follow-up with Dr. Sherrine as scheduled. - Stable at 4.8 cm.  11.  CKD stage IIIa -Remained stable during the hospitalization.  Procedures: CT angiogram chest 07/14/2024 Chest x-ray 07/14/2024, 07/19/2024  Consultations: PCCM/pulmonary: Dr. Shelah 07/20/2024 Oncology: Dr. Cloretta 07/21/2024  Discharge Exam: Vitals:   07/27/24 0445 07/27/24 0756  BP: 134/63   Pulse: (!) 53   Resp: 19   Temp: 98 F (36.7 C)   SpO2: 95% 95%    General: NAD Cardiovascular: RRR no murmurs rubs or gallop.  No JVD.  No lower extremity edema. Respiratory: Clear to auscultation bilaterally.  No wheezes, no crackles, no rhonchi.  Fair air movement.  Speaking in full sentences.  Discharge Instructions   Discharge Instructions     Amb Referral to Intravenous Iron  Therapy   Complete by: As directed    You have been referred to [facility] team for IV Iron  Infusions. The infusion pharmacy team will reach out to you with appointment information.    Primary Diagnosis Code for IV Iron : D50.0 - Iron  deficiency Anemia secondary to blood loss (Chronic)   Secondary diagnosis code for IV iron : N18.x - CKD   Diet -  low sodium heart healthy   Complete by: As directed    Increase activity slowly   Complete by: As directed       Allergies as of 07/27/2024   No Known Allergies      Medication List     TAKE these medications    albuterol  108 (90 Base) MCG/ACT inhaler Commonly known as: VENTOLIN  HFA Inhale 2 puffs into the lungs every 6 (six) hours as needed for wheezing or shortness of breath.   BiDil  20-37.5 MG tablet Generic drug: isosorbide -hydrALAZINE  Take 2 tablets by mouth 3 (three) times daily.    carvedilol  25 MG tablet Commonly known as: COREG  TAKE TWO TABLETS BY MOUTH TWICE DAILY with meals What changed:  how much to take how to take this when to take this additional instructions   clobetasol  ointment 0.05 % Commonly known as: TEMOVATE  Apply 1 application  topically daily as needed (for itching- Lichen planus).   diclofenac sodium 1 % Gel Commonly known as: VOLTAREN Apply 2 g topically 4 (four) times daily as needed (for pain).   felodipine  10 MG 24 hr tablet Commonly known as: PLENDIL  Take 10 mg by mouth in the morning. What changed: Another medication with the same name was removed. Continue taking this medication, and follow the directions you see here.   ferrous sulfate  325 (65 FE) MG tablet Take 325 mg by mouth 2 (two) times daily with a meal.   fluticasone -salmeterol 250-50 MCG/ACT Aepb Commonly known as: ADVAIR Inhale one puff by mouth into the lungs in the morning and at bedtime. What changed: See the new instructions.   HYDROcodone -acetaminophen  5-325 MG tablet Commonly known as: NORCO/VICODIN Take 1 tablet by mouth 2 (two) times daily as needed (for pain).   lidocaine  5 % Commonly known as: LIDODERM  Place 1 patch onto the skin daily. Remove & Discard patch within 12 hours or as directed by MD Start taking on: July 28, 2024   loratadine 10 MG tablet Commonly known as: CLARITIN Take 10 mg by mouth every morning.   losartan  100 MG tablet Commonly known as: COZAAR  Take 100 mg by mouth daily.   MEGARED OMEGA-3 KRILL OIL PO Take 1 capsule by mouth daily.   methocarbamol  500 MG tablet Commonly known as: ROBAXIN  Take 500 mg by mouth at bedtime as needed for muscle spasms.   multivitamin with minerals Tabs tablet Take 1 tablet by mouth daily with breakfast.   nystatin  100000 UNIT/ML suspension Commonly known as: MYCOSTATIN  Use as directed 5 mLs (500,000 Units total) in the mouth or throat 4 (four) times daily for 3 days.   omeprazole 20 MG  capsule Commonly known as: PRILOSEC Take 20 mg by mouth daily.   OneTouch Verio test strip Generic drug: glucose blood USE TO TEST BID AS DIRECTED   polyethylene glycol 17 g packet Commonly known as: MIRALAX  / GLYCOLAX  Take 17 g by mouth 2 (two) times daily.   senna 8.6 MG Tabs tablet Commonly known as: SENOKOT Take 2 tablets (17.2 mg total) by mouth at bedtime.   simvastatin  40 MG tablet Commonly known as: ZOCOR  Take 40 mg by mouth every evening.   spironolactone  50 MG tablet Commonly known as: ALDACTONE  Take 0.5 tablets (25 mg total) by mouth daily.   Systane 0.4-0.3 % Soln Generic drug: Polyethyl Glycol-Propyl Glycol Place 1 drop into both eyes 3 (three) times daily as needed (for dryness).   Trulicity 0.75 MG/0.5ML Soaj Generic drug: Dulaglutide Inject 0.75 mg into the skin every Wednesday.  Tylenol  325 MG tablet Generic drug: acetaminophen  Take 325-650 mg by mouth every 6 (six) hours as needed for mild pain (pain score 1-3) or headache.   urea 10 % cream Commonly known as: CARMOL Apply 1 application  topically as needed (for dryness).   vitamin B-12 500 MCG tablet Commonly known as: CYANOCOBALAMIN  Take 500 mcg by mouth daily.       No Known Allergies  Contact information for follow-up providers     MD AT SNF Follow up.          Cloretta Arley NOVAK, MD Follow up in 1 week(s).   Specialty: Oncology Why: Follow-up in 1 to 2 weeks or as scheduled. Contact information: [address] [place] [address] [phone]              Contact information for after-discharge care     Destination     [facility] .   Service: Skilled Nursing Contact information: [address] [place]  [phone]                      The results of significant diagnostics from this hospitalization (including imaging, microbiology, ancillary and laboratory) are listed below for reference.    Significant  Diagnostic Studies: DG CHEST PORT 1 VIEW Result Date: 07/19/2024 CLINICAL DATA:  Lobar pneumonia follow-up. EXAM: PORTABLE CHEST 1 VIEW COMPARISON:  Chest radiograph dated 07/14/2024. FINDINGS: There is mild eventration of the right hemidiaphragm. There is shallow inspiration with bibasilar atelectasis. An infiltrative process at the left lung base is not excluded. No pleural effusion or pneumothorax. Mild cardiomegaly. No acute osseous pathology. IMPRESSION: Shallow inspiration with bibasilar atelectasis. An infiltrative process at the left lung base is not excluded. Electronically Signed   By: Vanetta Chou M.D.   On: 07/19/2024 14:13   CT Angio Chest PE W/Cm &/Or Wo Cm Result Date: 07/14/2024 CLINICAL DATA:  Increased shortness of breath and hypoxia associated with tachypnea EXAM: CT ANGIOGRAPHY CHEST WITH CONTRAST TECHNIQUE: Multidetector CT imaging of the chest was performed using the standard protocol during bolus administration of intravenous contrast. Multiplanar CT image reconstructions and MIPs were obtained to evaluate the vascular anatomy. RADIATION DOSE REDUCTION: This exam was performed according to the departmental dose-optimization program which includes automated exposure control, adjustment of the mA and/or kV according to patient size and/or use of iterative reconstruction technique. CONTRAST:  OMNIPAQUE  IOHEXOL  350 MG/ML SOLN COMPARISON:  CT chest dated 07/06/2024 FINDINGS: Cardiovascular: The study is adequate for the evaluation of pulmonary embolism the level of proximal segmental pulmonary arteries due to motion artifact and timing of contrast bolus. There are no filling defects in the central, lobar, or proximal segmental pulmonary artery branches to suggest acute pulmonary embolism. Main pulmonary artery measures 4.5 cm. Ascending thoracic aorta measures 4.8 x 4.7 cm. Mild multichamber cardiomegaly. No significant pericardial fluid/thickening. Coronary artery calcifications and  aortic atherosclerosis. Mediastinum/Nodes: Imaged thyroid  gland without nodules meeting criteria for imaging follow-up by size. Normal esophagus. No substantial change in multi station lymphadenopathy, for example 14 mm right low paratracheal (7:48), 12 mm AP window (7:67), and 13 mm right hilar (7:59). Lungs/Pleura: The central airways are patent. Unchanged asymmetric elevation of the right hemidiaphragm. Increased bibasilar subsegmental atelectasis. New peripheral left lower lobe consolidation (9:81). Mild interlobular septal thickening with scattered peribronchovascular ground-glass opacities. No pneumothorax. No pleural effusion. Upper abdomen: Cholelithiasis. Musculoskeletal: No acute or abnormal lytic or blastic osseous lesions. Multilevel degenerative changes of the thoracic spine. Review  of the MIP images confirms the above findings. IMPRESSION: 1. No evidence of acute pulmonary embolism to the level of the proximal segmental pulmonary arteries. 2. New peripheral left lower lobe consolidation, suspicious for pneumonia. 3. Mild interlobular septal thickening with scattered peribronchovascular ground-glass opacities, which may represent pulmonary edema. 4. Dilated main pulmonary artery measuring 4.5 cm can be seen in the setting of pulmonary arterial hypertension. 5. Unchanged mediastinal and right hilar lymphadenopathy, likely reactive. 6. 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 Ascending thoracic aortic aneurysm. Recommend semi-annual imaging followup by CTA or MRA and referral to cardiothoracic surgery if not already obtained. This recommendation follows 2010 ACCF/AHA/AATS/ACR/ASA/SCA/SCAI/SIR/STS/SVM Guidelines for the Diagnosis and Management of Patients With Thoracic Aortic Disease. Circulation. 2010; 121: Z733-z630. Aortic aneurysm NOS (ICD10-I71.9) 7.  Aortic Atherosclerosis (ICD10-I70.0). Electronically Signed   By: Limin  Xu M.D.   On: 07/14/2024 15:35   DG Chest Port 1 View Result  Date: 07/14/2024 CLINICAL DATA:  Shortness of breath, hypoxia. EXAM: PORTABLE CHEST 1 VIEW COMPARISON:  May 31, 2024. FINDINGS: Stable cardiomediastinal silhouette. Hypoinflation of the lungs with minimal bibasilar subsegmental atelectasis. Bony thorax is unremarkable. IMPRESSION: Hypoinflation of the lungs with minimal bibasilar subsegmental atelectasis. Electronically Signed   By: Lynwood Landy Raddle M.D.   On: 07/14/2024 14:06   CT CHEST ABDOMEN PELVIS W CONTRAST Result Date: 07/06/2024 CLINICAL DATA:  New diagnosis of stomach cancer EXAM: CT CHEST, ABDOMEN, AND PELVIS WITH CONTRAST TECHNIQUE: Multidetector CT imaging of the chest, abdomen and pelvis was performed following the standard protocol during bolus administration of intravenous contrast. RADIATION DOSE REDUCTION: This exam was performed according to the departmental dose-optimization program which includes automated exposure control, adjustment of the mA and/or kV according to patient size and/or use of iterative reconstruction technique. CONTRAST:  80mL OMNIPAQUE  IOHEXOL  300 MG/ML  SOLN COMPARISON:  Chest CT March 03, 2024 FINDINGS: CT CHEST FINDINGS Cardiovascular: The heart size is borderline normal. Enlarged pulmonary artery measuring 44 mm. Aneurysmal dilation of ascending aorta measures 4.9 cm. No pericardial fluid. Mediastinum/Nodes: Multiple prominent subcentimeter and pericentimeter mediastinal lymph nodes. Index node in right lower paratracheal measures 1.4 cm. Lungs/Pleura: Right upper lobe pulmonary nodule measuring 5 mm (7/34), new to prior. No emphysematous changes, air space consolidation or honeycombing. No pleural effusion. Musculoskeletal: Unremarkable CT ABDOMEN PELVIS FINDINGS Hepatobiliary: Hepatic steatosis. Cholelithiasis without significant biliary dilatation. Normal gallbladder wall thickness. Pancreas: Unremarkable. No pancreatic ductal dilatation or surrounding inflammatory changes. Spleen: Normal in size without focal  abnormality. Adrenals/Urinary Tract: Right adrenal small nodule measuring 8 mm. Left adrenal is normal. Bilateral simple renal cortical cysts which does not require imaging follow-up. Kidneys are otherwise normal, without renal calculi, or hydronephrosis. Bladder is unremarkable. Stomach/Bowel: There is an enhancing segment wall thickening/mass along the lesser curvature of the stomach measuring about 7.3 x 3.2 cm consistent with known malignancy. Bowel loops are unremarkable Vascular/Lymphatic: Mesenteric root fat stranding with multiple para-aortic aortocaval and pericaval prominent subcentimeter and pericentimeter lymph nodes. Celiac lymph node measuring 1.4 cm (2/56). Additional subcentimeter multiple mesenteric and celiac node lymph nodes index node in aortocaval measures 9 mm (2/79 (2/79, 73 Reproductive: Nodular heterogeneous enlarged prostate. Other: Bilateral fat containing inguinal hernias. Small fat containing umbilical hernia. Musculoskeletal: Fat containing intramuscular lipoma along the left iliacus muscle measuring 2.7 cm. No suspicious osseous lesion. IMPRESSION: Enhancing lesser curvature wall thickening/mass likely correlating to known gastric malignancy. Mesenteric root fat stranding and multiple subcentimeter and pericentimeter retroperitoneal/para-aortic and celiac lymph nodes concerning for metastatic nodal disease. New upper lobe  pulmonary nodule, indeterminate. Metastasis cannot be excluded. Follow-up according to oncology protocols as Fleischner guidelines cannot be utilized and a it is in this patient with known malignancy. Stable ascending aorta aneurysm. Enlarged pulmonary artery which can be seen the setting of pulmonary artery hypertension. Cholelithiasis. Right adrenal small nodule, indeterminate. Metastasis cannot be excluded. Electronically Signed   By: Megan  Zare M.D.   On: 07/06/2024 13:20    Microbiology: No results found for this or any previous visit (from the past 240  hours).   Labs: Basic Metabolic Panel: Recent Labs  Lab 07/21/24 0341 07/22/24 0328 07/23/24 0300 07/24/24 0617 07/26/24 0856  NA 135 131* 136 135 136  K 3.7 3.6 4.1 4.1 3.9  CL 95* 92* 96* 95* 98  CO2 29 28 32 30 29  GLUCOSE 101* 101* 107* 104* 93  BUN 17 14 14 15 15   CREATININE 1.13 1.05 1.38* 1.28* 1.27*  CALCIUM 8.7* 8.4* 8.5* 8.6* 8.4*  MG  --   --  2.0 2.2 2.1  PHOS  --   --   --   --  3.6   Liver Function Tests: Recent Labs  Lab 07/23/24 0300 07/24/24 0617  AST 39 36  ALT 38 33  ALKPHOS 38 40  BILITOT 0.4 0.7  PROT 6.6 6.7  ALBUMIN  2.8* 2.8*   No results for input(s): LIPASE, AMYLASE in the last 168 hours. No results for input(s): AMMONIA in the last 168 hours. CBC: Recent Labs  Lab 07/21/24 0341 07/22/24 0328 07/23/24 0300 07/24/24 0617 07/26/24 0856  WBC 7.5 7.4 6.8 7.3 7.4  NEUTROABS  --   --  4.0 4.5  --   HGB 7.7* 7.7* 7.8* 8.0* 7.8*  HCT 27.1* 27.1* 27.8* 28.6* 27.3*  MCV 78.3* 77.7* 78.3* 79.0* 77.8*  PLT 204 218 223 239 231   Cardiac Enzymes: No results for input(s): CKTOTAL, CKMB, CKMBINDEX, TROPONINI in the last 168 hours. BNP: BNP (last 3 results) No results for input(s): BNP in the last 8760 hours.  ProBNP (last 3 results) Recent Labs    07/14/24 1554  PROBNP 237.0    CBG: Recent Labs  Lab 07/26/24 1223 07/26/24 1658 07/26/24 2110 07/27/24 0740 07/27/24 1146  GLUCAP 123* 176* 116* 92 130*       Signed:  Toribio Hummer MD.  [facility] 07/27/2024, 1:44 PM

## 2024-07-27 NOTE — TOC Transition Note (Signed)
 Transition of Care [facility]) - Discharge Note   Patient Details  Name: Bradley Alvarez MRN: [id number] Date of Birth: 12-30-1940  Transition of Care [facility]) CM/SW Contact:  Sheri ONEIDA Sharps, LCSW Phone Number: 07/27/2024, 3:48 PM   Clinical Narrative:    Pt medically ready to dc to [facility]. Call report [phone] room 110 given to nurse. DC packet left at nurses station. Pt wife will provide transportation at 4:30pm. No further TOC needs.   Final next level of care: Skilled Nursing Facility Barriers to Discharge: Barriers Resolved   Patient Goals and CMS Choice Patient states their goals for this hospitalization and ongoing recovery are:: return home following STR CMS Medicare.gov Compare Post Acute Care list provided to:: Patient Choice offered to / list presented to : Patient [place] ownership interest in [facility].provided to:: Patient    Discharge Placement PASRR number recieved: 07/22/24            Patient chooses bed at: Other - please specify in the comment section below: [facility]) Patient to be transferred to facility by: wife   Patient and family notified of of transfer: 07/27/24  Discharge Plan and Services Additional resources added to the After Visit Summary for   In-house Referral: NA Discharge Planning Services: NA            DME Arranged: N/A DME Agency: NA       HH Arranged: NA HH Agency: NA        Social Drivers of Health (SDOH) Interventions SDOH Screenings   Food Insecurity: No Food Insecurity (07/14/2024)  Housing: Low Risk  (07/14/2024)  Transportation Needs: No Transportation Needs (07/14/2024)  Utilities: Not At Risk (07/14/2024)  Alcohol  Screen: Low Risk  (07/12/2024)  Depression (PHQ2-9): Low Risk  (07/12/2024)  Financial Resource Strain: Low Risk  (07/12/2024)  Physical Activity: Inactive (07/12/2024)  Social Connections: Socially Integrated (07/14/2024)  Stress: No Stress Concern Present (07/12/2024)  Tobacco Use: Medium  Risk (07/14/2024)  Health Literacy: Adequate Health Literacy (07/12/2024)     Readmission Risk Interventions    07/16/2024    9:18 AM  Readmission Risk Prevention Plan  Transportation Screening Complete  PCP or Specialist Appt within 3-5 Days Complete  HRI or Home Care Consult Complete  Social Work Consult for Recovery Care Planning/Counseling Complete  Palliative Care Screening Not Applicable  Medication Review [employer]) Complete

## 2024-07-27 NOTE — Progress Notes (Signed)
 Gave report to rehab nurse

## 2024-07-28 ENCOUNTER — Telehealth: Payer: Self-pay | Admitting: Nurse Practitioner

## 2024-07-28 NOTE — Telephone Encounter (Signed)
 Scan Review appt scheduled and confirmed

## 2024-07-29 ENCOUNTER — Encounter (HOSPITAL_COMMUNITY)
Admission: RE | Admit: 2024-07-29 | Discharge: 2024-07-29 | Disposition: A | Discharge status: Home / Self Care | Source: Ambulatory Visit | Attending: Nurse Practitioner | Admitting: Nurse Practitioner

## 2024-07-29 ENCOUNTER — Telehealth: Payer: Self-pay | Admitting: Genetic Counselor

## 2024-07-29 DIAGNOSIS — C162 Malignant neoplasm of body of stomach: Secondary | ICD-10-CM | POA: Diagnosis not present

## 2024-07-29 DIAGNOSIS — I5033 Acute on chronic diastolic (congestive) heart failure: Secondary | ICD-10-CM | POA: Diagnosis not present

## 2024-07-29 DIAGNOSIS — D500 Iron deficiency anemia secondary to blood loss (chronic): Secondary | ICD-10-CM | POA: Diagnosis not present

## 2024-07-29 DIAGNOSIS — K219 Gastro-esophageal reflux disease without esophagitis: Secondary | ICD-10-CM | POA: Diagnosis not present

## 2024-07-29 DIAGNOSIS — C169 Malignant neoplasm of stomach, unspecified: Secondary | ICD-10-CM | POA: Diagnosis not present

## 2024-07-29 LAB — GLUCOSE, CAPILLARY: Glucose-Capillary: 97 mg/dL (ref 70–99)

## 2024-07-29 MED ORDER — FLUDEOXYGLUCOSE F - 18 (FDG) INJECTION
15.0000 | Freq: Once | INTRAVENOUS | Status: AC | PRN
  Administered 2024-07-29: 15 via INTRAVENOUS

## 2024-07-29 NOTE — Telephone Encounter (Addendum)
 Disregard

## 2024-08-02 DIAGNOSIS — E119 Type 2 diabetes mellitus without complications: Secondary | ICD-10-CM | POA: Diagnosis not present

## 2024-08-02 DIAGNOSIS — J181 Lobar pneumonia, unspecified organism: Secondary | ICD-10-CM | POA: Diagnosis not present

## 2024-08-02 DIAGNOSIS — J9601 Acute respiratory failure with hypoxia: Secondary | ICD-10-CM | POA: Diagnosis not present

## 2024-08-02 DIAGNOSIS — I5033 Acute on chronic diastolic (congestive) heart failure: Secondary | ICD-10-CM | POA: Diagnosis not present

## 2024-08-03 DIAGNOSIS — C162 Malignant neoplasm of body of stomach: Secondary | ICD-10-CM | POA: Diagnosis not present

## 2024-08-03 DIAGNOSIS — F4321 Adjustment disorder with depressed mood: Secondary | ICD-10-CM | POA: Diagnosis not present

## 2024-08-04 ENCOUNTER — Ambulatory Visit: Admitting: Pulmonary Disease

## 2024-08-04 ENCOUNTER — Encounter (HOSPITAL_COMMUNITY): Payer: Self-pay | Admitting: Oncology

## 2024-08-05 DIAGNOSIS — M6281 Muscle weakness (generalized): Secondary | ICD-10-CM | POA: Diagnosis not present

## 2024-08-05 DIAGNOSIS — I5033 Acute on chronic diastolic (congestive) heart failure: Secondary | ICD-10-CM | POA: Diagnosis not present

## 2024-08-05 DIAGNOSIS — C169 Malignant neoplasm of stomach, unspecified: Secondary | ICD-10-CM | POA: Diagnosis not present

## 2024-08-08 ENCOUNTER — Other Ambulatory Visit: Payer: Self-pay | Admitting: *Deleted

## 2024-08-08 ENCOUNTER — Inpatient Hospital Stay: Attending: Nurse Practitioner | Admitting: Nurse Practitioner

## 2024-08-08 ENCOUNTER — Encounter: Payer: Self-pay | Admitting: Nurse Practitioner

## 2024-08-08 ENCOUNTER — Encounter: Payer: Self-pay | Admitting: *Deleted

## 2024-08-08 VITALS — BP 114/63 | HR 61 | Temp 97.9°F | Resp 18 | Ht 74.0 in | Wt 317.0 lb

## 2024-08-08 DIAGNOSIS — I130 Hypertensive heart and chronic kidney disease with heart failure and stage 1 through stage 4 chronic kidney disease, or unspecified chronic kidney disease: Secondary | ICD-10-CM | POA: Diagnosis not present

## 2024-08-08 DIAGNOSIS — C162 Malignant neoplasm of body of stomach: Secondary | ICD-10-CM

## 2024-08-08 DIAGNOSIS — Z5112 Encounter for antineoplastic immunotherapy: Secondary | ICD-10-CM | POA: Diagnosis not present

## 2024-08-08 DIAGNOSIS — G4730 Sleep apnea, unspecified: Secondary | ICD-10-CM | POA: Insufficient documentation

## 2024-08-08 DIAGNOSIS — Z1732 Human epidermal growth factor receptor 2 negative status: Secondary | ICD-10-CM | POA: Diagnosis not present

## 2024-08-08 DIAGNOSIS — I5033 Acute on chronic diastolic (congestive) heart failure: Secondary | ICD-10-CM | POA: Diagnosis not present

## 2024-08-08 DIAGNOSIS — I2510 Atherosclerotic heart disease of native coronary artery without angina pectoris: Secondary | ICD-10-CM | POA: Insufficient documentation

## 2024-08-08 DIAGNOSIS — N189 Chronic kidney disease, unspecified: Secondary | ICD-10-CM | POA: Insufficient documentation

## 2024-08-08 DIAGNOSIS — G4733 Obstructive sleep apnea (adult) (pediatric): Secondary | ICD-10-CM | POA: Insufficient documentation

## 2024-08-08 DIAGNOSIS — I7121 Aneurysm of the ascending aorta, without rupture: Secondary | ICD-10-CM | POA: Insufficient documentation

## 2024-08-08 DIAGNOSIS — Z86711 Personal history of pulmonary embolism: Secondary | ICD-10-CM | POA: Insufficient documentation

## 2024-08-08 DIAGNOSIS — I509 Heart failure, unspecified: Secondary | ICD-10-CM | POA: Insufficient documentation

## 2024-08-08 DIAGNOSIS — C169 Malignant neoplasm of stomach, unspecified: Secondary | ICD-10-CM | POA: Insufficient documentation

## 2024-08-08 DIAGNOSIS — D509 Iron deficiency anemia, unspecified: Secondary | ICD-10-CM | POA: Diagnosis not present

## 2024-08-08 NOTE — Progress Notes (Signed)
 [facility] OFFICE PROGRESS NOTE   Diagnosis: Gastric cancer  INTERVAL HISTORY:   Bradley Alvarez returns as scheduled.  He was hospitalized 07/14/2024 through 07/27/2024 with acute respiratory failure, left lower lobe pneumonia acute on chronic diastolic CHF.  He is feeling better.  His breathing has improved.  He denies bleeding.  No burgundy or black stools.  Objective:  Vital signs in last 24 hours:  Blood pressure 114/63, pulse 61, temperature 97.9 F (36.6 C), temperature source Temporal, resp. rate 18, height 6' 2 (1.88 m), weight (!) 317 lb (143.8 kg), SpO2 97%.    Resp: Lungs clear bilaterally. Cardio: Regular rate and rhythm. GI: No hepatosplenomegaly.  Nontender. Vascular: Trace lower leg edema bilaterally. Neuro: Alert and oriented.    Lab Results:  Lab Results  Component Value Date   WBC 7.4 07/26/2024   HGB 7.8 (L) 07/26/2024   HCT 27.3 (L) 07/26/2024   MCV 77.8 (L) 07/26/2024   PLT 231 07/26/2024   NEUTROABS 4.5 07/24/2024    Imaging:  No results found.  Medications: I have reviewed the patient's current medications.  Assessment/Plan: Gastric cancer Upper endoscopy 07/01/2024-large fungating, ulcerated, noncircumferential mass on the lesser curvature of the stomach.  Pathology-invasive moderately differentiated adenocarcinoma; HER2 negative, loss of MLH1 and PMS2, Claudin 18-negative, PD-L1 CPS-50, MLH1 methylation detected, BRAF-negative; foundation 1-MSI high, tumor mutation burden 41 CTs 07/06/2024-enhancing lesser curvature wall thickening/mass; mesenteric root fat stranding and multiple retroperitoneal/periaortic and celiac lymph nodes; new right upper lobe upper lobe pulmonary nodule; multiple prominent mediastinal lymph nodes 07/29/2024 PET scan-hypermetabolic gastric mass.  No evidence of metastatic disease. Iron  deficiency anemia  CAD/CHF Diabetes Hypertension CKD Obstructive sleep apnea History of pulmonary embolus August  2022-previously on apixaban .  Apixaban  stopped due to bleeding. Ascending aortic aneurysm Admission 07/14/2024 with pneumonia and respiratory failure    Disposition: Bradley Alvarez appears stable.  We reviewed the recent PET scan report/images with him and his family.  There is no evidence of metastatic disease.  They understand he does not appear to be a surgical candidate and is not an ideal candidate for chemotherapy with multiple comorbid conditions.  Dr. Andriette recommendation is for immunotherapy with ipilimumab/nivolumab.  We reviewed potential side effects associated with immunotherapy including rash, colitis, nephritis, hepatitis, pneumonitis, myocarditis, endocrinopathies, neurologic toxicities, ophthalmologic toxicity, cerebritis.  He was provided with printed information.  He will attend an education class.  He would like to proceed with treatment as above.  He reports poor peripheral venous access.  We will arrange for Port-A-Cath placement.  He has iron  deficiency anemia, likely secondary to the gastric mass.  He is scheduled for IV iron  at an infusion center.  He would like to complete the infusions in our office.  We will schedule him for IV iron  over the next few weeks.  We will see him in follow-up prior to the cycle 1 day 1 ipilimumab/nivolumab.  We are available to see him sooner if needed.  Patient seen with Dr. Cloretta.    [name] ANP/GNP-BC   08/08/2024  1:57 PM This was a shared visit with [name].  Bradley Alvarez has recovered from the hospital admission with pneumonia and respiratory failure.  We reviewed the staging PET images and findings with Bradley Alvarez and his family.  He appears to have tumor localized to the stomach.  He is not a surgical candidate.  Treatment options include observation, palliative chemotherapy, chemotherapy/radiation, and immunotherapy.  The tumor has an MSI high phenotype.  He presented with  severe iron  deficiency anemia.  We discussed  the recent data documenting a significant response rate with immunotherapy in this setting.  He appears to be a candidate for immunotherapy.  I recommend ipilimumab/nivolumab.  We reviewed potential toxicities associated with this regimen.  He agrees to proceed.  The plan is to initiate treatment with ipilimumab/nivolumab after Port-A-Cath placement.  He will receive 2 treatments with the Luma Mab.  Nivolumab will be continued for up to a year if he has a response to therapy.  We will follow his clinical status, a repeat CT, and restaging EGD as markers of treatment response.  A treatment plan was entered today.  I was present for greater than 50% of today's visit.  I performed medical decision making.  [name], MD

## 2024-08-08 NOTE — Progress Notes (Signed)
 START OFF PATHWAY REGIMEN - Gastroesophageal   [id number]:Ipilimumab 1 mg/kg IV D1 + Nivolumab 3 mg/kg IV D1,15,29 q42 Days:   A cycle is every 42 days:     Nivolumab      Ipilimumab   **Always confirm dose/schedule in your pharmacy ordering system**  Patient Characteristics: Gastric, Adenocarcinoma, Preoperative or Nonsurgical Candidate, M0 (Clinical Staging), Stage I-III (cT1-T4a, any N), Nonsurgical Candidate OR Stage IVA (cT4b, any N), Not a Radiation Candidate, HER2 Negative/Unknown, PD?L1 Expression Positive CPS ? 5 Therapeutic Status: Preoperative or Nonsurgical Candidate, M0 (Clinical Staging) Histology: Adenocarcinoma Disease Classification: Gastric AJCC N Category: cN0 AJCC M Category: cM0 AJCC 8 Stage Grouping: Unknown AJCC T Category: cTX HER2 Status: Negative PD-L1 Expression Status: PD-L1 Expression Positive CPS ? 5 Intent of Therapy: Curative Intent, Discussed with Patient

## 2024-08-08 NOTE — Progress Notes (Signed)
 PATIENT NAVIGATOR PROGRESS NOTE  Name: Bradley Alvarez Date: 08/08/2024 MRN: [id number]  DOB: [birth year]   Reason for visit:  F/U appt  Comments:  Met with Bradley Alvarez and family during appt with [name] NP  Given information on Ipilimumab and Nivolumab  Will have Port placed on Friday 08/12/24 at [facility], written instructions given  Will have patient education session on Monday 9/15    Time spent counseling/coordinating care: 45-60 minutes

## 2024-08-09 ENCOUNTER — Other Ambulatory Visit: Payer: Self-pay

## 2024-08-10 ENCOUNTER — Ambulatory Visit: Attending: Cardiovascular Disease | Admitting: Emergency Medicine

## 2024-08-10 ENCOUNTER — Encounter: Payer: Self-pay | Admitting: Emergency Medicine

## 2024-08-10 VITALS — BP 106/52 | HR 63 | Ht 74.0 in | Wt 313.0 lb

## 2024-08-10 DIAGNOSIS — I5032 Chronic diastolic (congestive) heart failure: Secondary | ICD-10-CM

## 2024-08-10 DIAGNOSIS — I5030 Unspecified diastolic (congestive) heart failure: Secondary | ICD-10-CM | POA: Diagnosis not present

## 2024-08-10 DIAGNOSIS — C169 Malignant neoplasm of stomach, unspecified: Secondary | ICD-10-CM

## 2024-08-10 DIAGNOSIS — N1831 Chronic kidney disease, stage 3a: Secondary | ICD-10-CM

## 2024-08-10 DIAGNOSIS — E785 Hyperlipidemia, unspecified: Secondary | ICD-10-CM | POA: Diagnosis not present

## 2024-08-10 DIAGNOSIS — I2510 Atherosclerotic heart disease of native coronary artery without angina pectoris: Secondary | ICD-10-CM

## 2024-08-10 DIAGNOSIS — I712 Thoracic aortic aneurysm, without rupture, unspecified: Secondary | ICD-10-CM

## 2024-08-10 DIAGNOSIS — I10 Essential (primary) hypertension: Secondary | ICD-10-CM | POA: Diagnosis not present

## 2024-08-10 DIAGNOSIS — G4733 Obstructive sleep apnea (adult) (pediatric): Secondary | ICD-10-CM | POA: Diagnosis not present

## 2024-08-10 MED ORDER — FUROSEMIDE 40 MG PO TABS: 40.0000 mg | ORAL_TABLET | Freq: Every day | ORAL | 1 refills | Status: AC

## 2024-08-10 MED ORDER — POTASSIUM CHLORIDE CRYS ER 20 MEQ PO TBCR: 20.0000 meq | EXTENDED_RELEASE_TABLET | Freq: Every day | ORAL | 1 refills | Status: AC

## 2024-08-10 NOTE — Patient Instructions (Addendum)
 Medication Instructions:  START Lasix  40mg  Take 1 tablet daily; ok to START with 20mg  (half tablet first) START Potassium 20meq Take 1 tablet daily *If you need a refill on your cardiac medications before your next appointment, please call your pharmacy*  Lab Work: TODAY-BMET If you have labs (blood work) drawn today and your tests are completely normal, you will receive your results only by: MyChart Message (if you have MyChart) OR A paper copy in the mail If you have any lab test that is abnormal or we need to change your treatment, we will call you to review the results.  Testing/Procedures: Your physician has requested that you have an echocardiogram. Echocardiography is a painless test that uses sound waves to create images of your heart. It provides your doctor with information about the size and shape of your heart and how well your heart's chambers and valves are working. This procedure takes approximately one hour. There are no restrictions for this procedure. Please do NOT wear cologne, perfume, aftershave, or lotions (deodorant is allowed). Please arrive 15 minutes prior to your appointment time.  Please note: We ask at that you not bring children with you during ultrasound (echo/ vascular) testing. Due to room size and safety concerns, children are not allowed in the ultrasound rooms during exams. Our front office staff cannot provide observation of children in our lobby area while testing is being conducted. An adult accompanying a patient to their appointment will only be allowed in the ultrasound room at the discretion of the ultrasound technician under special circumstances. We apologize for any inconvenience.   Follow-Up: At [facility], you and your health needs are our priority.  As part of our continuing mission to provide you with exceptional heart care, our providers are all part of one team.  This team includes your primary Cardiologist (physician) and Advanced  Practice Providers or APPs (Physician Assistants and Nurse Practitioners) who all work together to provide you with the care you need, when you need it.  Your next appointment:   FOLLOW UP AS SCHEDULED   Provider:   Newman JINNY Lawrence, MD    We recommend signing up for the patient portal called MyChart.  Sign up information is provided on this After Visit Summary.  MyChart is used to connect with patients for Virtual Visits (Telemedicine).  Patients are able to view lab/test results, encounter notes, upcoming appointments, etc.  Non-urgent messages can be sent to your provider as well.   To learn more about what you can do with MyChart, go to [email or web address].   Other Instructions

## 2024-08-10 NOTE — Progress Notes (Signed)
 Cardiology Office Note:    Date:  08/10/2024  ID:  Bradley Alvarez, DOB 05/19/1941, MRN [id number] PCP: [name], MD  [facility] Providers Cardiologist:  Newman JINNY Lawrence, MD       Patient Profile:       Chief Complaint: Hospital follow-up for diastolic heart failure History of Present Illness:  Bradley Alvarez is a 83 y.o. male with visit-pertinent history of hypertension, type 2 diabetes, TIA, OSA, history of PE on Eliquis  in 2022, moderate asthma, chronic diastolic CHF, IDA due to chronic blood loss, gastric adenocarcinoma, thoracic aortic aneurysm, CKD stage III  Echocardiogram 01/2022 with LVEF 55 to 60%, moderate to severe LVH, no RWMA, grade 2 DD, ascending aorta dilated to 4.2 cm.  Echocardiogram 08/2023 showed LVEF 50%, severe concentric LVH, grade 1 DD, mild left atrial mildly dilated, ascending aortic aneurysm 4.8 cm.  He was last seen in clinic on 06/06/2024 by Dr. Lawrence.  Noted he is recently seen in the ED with complaint of epigastric/lower chest pain.  Pain lasted almost 1 day improved with Maalox.  Troponin mildly elevated at 36, 38.  Overall suspicion for this being related to ischemia was fairly low, no indication for further ischemic evaluation.  His blood pressure was low and his spironolactone  was reduced from 50 to 25 mg daily and his furosemide  was stopped.  He was recently admitted to the hospital on 07/14/2024 through 07/27/2024.  He was recently diagnosed with gastric cancer by EGD on 8/1 which showed a large fungating and ulcerated mass at the lower curvature of the stomach.  Pathology consistent with invasive moderately differentiated adenocarcinoma.  Patient had reported to the hospital with 2 weeks of progressive shortness of breath with cough occasionally productive of dark sputum.  SpO2 75% while in the ED.  He was placed on nonrebreather and subsequently on BiPAP with improvement.  CT angiogram chest was done and was negative for PE however concerning  for left lower lobe pneumonia in the setting of obesity hypoventilation syndrome and concern for superimposed acute on chronic diastolic CHF.  He was placed empirically on IV antibiotics.  He was initially diuresed with IV Lasix  and improved clinically and transition back to home regimen of spironolactone .  He improved clinically and was discharged in stable condition.  He was to start Lasix  40 mg for 3 days and follow-up with cardiology.  His Eliquis  for history of PE was also discontinued due to GI bleed.   Discussed the use of AI scribe software for clinical note transcription with the patient, who gave verbal consent to proceed.  History of Present Illness Bradley Alvarez is an 83 year old male with diastolic heart failure who presents for cardiology follow-up after recent hospitalization. He is accompanied by his wife, who has some nursing experience.   Today patient is doing well overall.  Tells me he feels much improved.  Continues to struggle with lower extremity swelling as well as scrotal swelling.  Was prescribed Lasix  40 mg for three days, which reduced swelling in his legs and scrotum.  His last dose of Lasix  was today.  He experiences improved shortness of breath since hospitalization and has no current chest pain, lightheadedness, dizziness, PND, or syncope.  He continues to wear CPAP machine nightly.  Blood pressure has been well-controlled at home.  He is scheduled to have a port placed and begin immunotherapy for his gastric adenocarcinoma as well as IV iron  for his IDA.  Review of systems:  Please  see the history of present illness. All other systems are reviewed and otherwise negative.      Studies Reviewed:        Echocardiogram 08/14/2023 Left ventricle cavity is normal in size. Severe concentric hypertrophy of  the left ventricle. Low normal global wall motion, LVEF around 50%.  Doppler evidence of grade I (impaired) diastolic dysfunction, normal LAP.  Left atrial cavity is  mildly dilated.  Trileaflet aortic valve. Trace aortic regurgitation.  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  Previous study on 01/09/2022 reported mod LVH, EF 55-60%,. grade II  diastolic dysfunction. Aortic dimensions are consistent with CT scan in  03/2023.   Echocardiogram 01/09/2022 Normal LV systolic function with visual EF 55-60%. Left ventricle cavity  is normal in size. Moderate to severe  left ventricular hypertrophy.  Normal global wall motion. Doppler evidence of grade II diastolic  dysfunction, normal LAP.  Left atrial cavity is severely dilated.  Trace aortic regurgitation.  Aortic root dilated (Sinus of valsalva 4.5cm). Proximal ascending aorta  dilated 4.2cm.  Compared to study 09/21/2020 LVEF remains preserved, Mild MR is now  resolved, Mild AR is now trace, proximal ascending is dilated 42mm (prior  not well visualized).   Echocardiogram 02/11/2021  1. Left ventricular ejection fraction, by estimation, is 50 to 55%. The  left ventricle has low normal function. The left ventricle has no regional  wall motion abnormalities. Left ventricular diastolic parameters are  consistent with Grade I diastolic  dysfunction (impaired relaxation).   2. Right ventricular systolic function is normal. The right ventricular  size is mildly enlarged.   3. The mitral valve is grossly normal. No evidence of mitral valve  regurgitation.   4. Tricuspid valve regurgitation is mild to moderate. IVC not well  visualized to estimate PASP.   5. The aortic valve is tricuspid. Aortic valve regurgitation is not  visualized.   6. Aortic dilatation noted. Aneurysm of the ascending aorta, measuring 43  mm.   7. Compared to previous outpatient study in 2021, AI, MR not well  appreciated. TR appears new.   Risk Assessment/Calculations:              Physical Exam:   VS:  BP (!) 106/52   Pulse 63   Ht 6' 2 (1.88 m)   Wt (!) 313 lb (142 kg)   SpO2 90%   BMI 40.19  kg/m    Wt Readings from Last 3 Encounters:  08/10/24 (!) 313 lb (142 kg)  08/08/24 (!) 317 lb (143.8 kg)  07/27/24 (!) 303 lb 1.6 oz (137.5 kg)    GEN: Well nourished, well developed in no acute distress NECK: No JVD; No carotid bruits CARDIAC: RRR, no murmurs, rubs, gallops RESPIRATORY:  Clear to auscultation without rales, wheezing or rhonchi  ABDOMEN: Soft, non-tender, non-distended EXTREMITIES: 2+ bilateral lower extremity edema; No acute deformity      Assessment and Plan:  Chronic diastolic heart failure Echocardiogram 08/2023 with LVEF 50%, grade 1 DD, severe LVH Echocardiogram 01/2022 with LVEF 55 to 60%, grade 2 DD, moderate to severe LVH Admitted 07/2024 in the setting of acute respiratory failure with hypoxia/hypercapnia due to pneumonia with exacerbation of his DHF.  Received IV diuresis with good UOP.  Discharged on home spironolactone  and x 3 days of p.o. Lasix . - Lasix  was previously stopped on 7/7 due to low blood pressures - Today he reports shortness of breath has vastly improved.  He is not in any acute  distress.  Reports ongoing leg and scrotal swelling since discharge which has improved since starting on p.o. Lasix .  On exam he does have 2+ bilateral lower extremity edema and lungs clear to auscultation.  Weight has been stable with gradual weight loss.  Denies orthopnea or PND - Plan to start Lasix  20 mg daily as we monitor kidney function with ability to increase to 40 mg as needed for swelling - Continue BiDil , carvedilol , losartan , spironolactone  - Plan to repeat echocardiogram for routine monitoring - BMET today  Hypertension Blood pressure today well-controlled at 106/52 - Continue losartan , carvedilol , BiDil , spironolactone , furosemide   Thoracic aortic aneurysm CTA chest 07/2024 showed ascending thoracic aneurysm measuring 4.8 cm - Stable and managed by Dr. Lucas  Coronary artery disease Hyperlipidemia Coronary artery calcifications and aortic  atherosclerosis seen on CTA LDL 70 on 05/2024 and well-controlled - Today patient is without anginal symptoms, no indication for further ischemic evaluation at this time - No longer on Eliquis  for PE treatment due to chronic blood loss from gastric malignancy.  Do not recommend aspirin  at this time - Continue simvastatin  40 mg daily  Obstructive sleep apnea - Remains adherent to nightly CPAP therapy  History of PE Originally diagnosed in 2022 - Of note Eliquis  was discontinued during admission due to history of GI bleed - Denies any new acute shortness of breath or leg pain  Gastric adenocarcinoma Diagnosed 07/01/2024 - Plans to start immunotherapy with  ipilimumab/nivolumab after port placement next week - Managed by Dr. Cloretta with oncology  Iron  deficiency anemia Hemoglobin 7.8 on 07/2024 Chronic blood loss felt secondary to presumed slow GI bleeding from gastric malignancy - On daily iron  supplementation with plan to receive IV iron  with oncology  CKD stage IIIa Creatinine 1.27 and GFR 56 on 07/2024 - Repeat BMET today for routine monitoring      Dispo: Follow-up as scheduled with Dr. Elmira  Signed, Lum LITTIE Louis, NP

## 2024-08-11 ENCOUNTER — Encounter: Payer: Self-pay | Admitting: Oncology

## 2024-08-11 ENCOUNTER — Ambulatory Visit

## 2024-08-11 ENCOUNTER — Other Ambulatory Visit: Payer: Self-pay | Admitting: Radiology

## 2024-08-11 ENCOUNTER — Ambulatory Visit: Payer: Self-pay | Admitting: Emergency Medicine

## 2024-08-11 DIAGNOSIS — I5043 Acute on chronic combined systolic (congestive) and diastolic (congestive) heart failure: Secondary | ICD-10-CM | POA: Diagnosis not present

## 2024-08-11 DIAGNOSIS — N1831 Chronic kidney disease, stage 3a: Secondary | ICD-10-CM | POA: Diagnosis not present

## 2024-08-11 DIAGNOSIS — D649 Anemia, unspecified: Secondary | ICD-10-CM | POA: Diagnosis not present

## 2024-08-11 DIAGNOSIS — I2699 Other pulmonary embolism without acute cor pulmonale: Secondary | ICD-10-CM | POA: Diagnosis not present

## 2024-08-11 DIAGNOSIS — I712 Thoracic aortic aneurysm, without rupture, unspecified: Secondary | ICD-10-CM | POA: Diagnosis not present

## 2024-08-11 DIAGNOSIS — C169 Malignant neoplasm of stomach, unspecified: Secondary | ICD-10-CM | POA: Diagnosis not present

## 2024-08-11 DIAGNOSIS — J9602 Acute respiratory failure with hypercapnia: Secondary | ICD-10-CM | POA: Diagnosis not present

## 2024-08-11 DIAGNOSIS — E785 Hyperlipidemia, unspecified: Secondary | ICD-10-CM | POA: Diagnosis not present

## 2024-08-11 DIAGNOSIS — J9601 Acute respiratory failure with hypoxia: Secondary | ICD-10-CM | POA: Diagnosis not present

## 2024-08-11 DIAGNOSIS — E1129 Type 2 diabetes mellitus with other diabetic kidney complication: Secondary | ICD-10-CM | POA: Diagnosis not present

## 2024-08-11 DIAGNOSIS — I714 Abdominal aortic aneurysm, without rupture, unspecified: Secondary | ICD-10-CM | POA: Diagnosis not present

## 2024-08-11 DIAGNOSIS — I130 Hypertensive heart and chronic kidney disease with heart failure and stage 1 through stage 4 chronic kidney disease, or unspecified chronic kidney disease: Secondary | ICD-10-CM | POA: Diagnosis not present

## 2024-08-11 LAB — BASIC METABOLIC PANEL WITH GFR
BUN/Creatinine Ratio: 6 — ABNORMAL LOW (ref 10–24)
BUN: 9 mg/dL (ref 8–27)
CO2: 23 mmol/L (ref 20–29)
Calcium: 8.5 mg/dL — ABNORMAL LOW (ref 8.6–10.2)
Chloride: 100 mmol/L (ref 96–106)
Creatinine, Ser: 1.48 mg/dL — ABNORMAL HIGH (ref 0.76–1.27)
Glucose: 87 mg/dL (ref 70–99)
Potassium: 4.3 mmol/L (ref 3.5–5.2)
Sodium: 137 mmol/L (ref 134–144)
eGFR: 47 mL/min/1.73 — ABNORMAL LOW (ref >59)

## 2024-08-11 NOTE — H&P (Incomplete)
 Chief Complaint: Oestreich cancer; referred for image guided Port-A-Cath placement to assist with treatment  Referring Provider(s): Sherrill,B  Supervising Physician: Johann Sieving  Patient Status: [facility] - Out-pt  History of Present Illness: Bradley Alvarez is an  83 y.o. male with past medical history significant for AAA, hypertension, coronary artery disease/heart failure, anemia, chronic kidney disease, diabetes, hearing difficulty, hyperlipidemia, obesity, PE 2022, osteoarthritis, sleep apnea who presents now with newly diagnosed gastric cancer.  He has poor venous access and is scheduled today for Port-A-Cath placement to assist with treatment.  He has recovered from a recent hospital admission with pneumonia and respiratory failure.  *** Patient is Full Code  Past Medical History:  Diagnosis Date   AAA (abdominal aortic aneurysm) without rupture (HCC)    followed by dr brain--  per office note dated 11-05-2018  3.5cm   Benign essential HTN    cardiologist-- dr tally   Chronic combined systolic (congestive) and diastolic (congestive) heart failure [facility])    cardiologist-- dr katharyn   CKD (chronic kidney disease), stage III (HCC)    Diabetes mellitus type 2, insulin  dependent (HCC)    followed by dr onita   Diabetic neuropathy (HCC)    DOE (dyspnea on exertion)    Edema of both lower extremities    Full dentures    HOH (hard of hearing)    does wear hearing aids   Hydrocele, bilateral    Lichen planus    Mixed hyperlipidemia    Morbid obesity with BMI of 45.0-49.9, adult (HCC)    Nocturia    OA (osteoarthritis)    OSA on CPAP 10/14/08   per study  AHI  61.8/hr ,  RDI  96.1/hr.     Past Surgical History:  Procedure Laterality Date   CARDIAC CATHETERIZATION  per pt yrs ago   was told no blockage   CARDIOVASCULAR STRESS TEST  07-19-2014   dr debby sor   Low risk nuclear study w/ small apical attentuation artifact/  normal LV function and wall motion ,  ef 53%   CATARACT EXTRACTION W/ INTRAOCULAR LENS  IMPLANT, BILATERAL  2018   COLONOSCOPY W/ BIOPSIES AND POLYPECTOMY     COLONOSCOPY WITH PROPOFOL  N/A 05/16/2016   Procedure: COLONOSCOPY WITH PROPOFOL ;  Surgeon: [name], MD;  Location: [facility];  Service: Endoscopy;  Laterality: N/A;   ESOPHAGOGASTRODUODENOSCOPY N/A 07/01/2024   Procedure: EGD (ESOPHAGOGASTRODUODENOSCOPY);  Surgeon: Just Belvie, MD;  Location: [facility];  Service: Gastroenterology;  Laterality: N/A;   HYDROCELE EXCISION Bilateral 11/15/2018   Procedure: HYDROCELECTOMY ADULT;  Surgeon: [name], Mark, MD;  Location: [facility];  Service: Urology;  Laterality: Bilateral;   KNEE ARTHROSCOPY Right 2009   MULTIPLE TOOTH EXTRACTIONS     ORIF ANKLE FRACTURE Left 09/13/2014   Procedure: Open Reduction Internal Fixation Left Ankle Medial Malleolus;  Surgeon: [name], MD;  Location: [facility] OR;  Service: Orthopedics;  Laterality: Left;   PROSTATECTOMY  1993 approx.  by dr matilda   for prostate enlargement   TOTAL KNEE ARTHROPLASTY Right 08/27/2009   dr jane @[facility]    TOTAL KNEE ARTHROPLASTY  12/20/2012   Procedure: TOTAL KNEE ARTHROPLASTY;  Surgeon: [name], MD;  Location: [facility] OR;  Service: Orthopedics;  Laterality: Left;  left total knee arthroplasty   TRANSTHORACIC ECHOCARDIOGRAM  11-04-2018   dr patwardhan   moderate to severe concentric LVH, ef 56%,  grade 1 diastolic dysfunciton/  mild AV calcification annulus and leaflets without stentosis ,  mild  regurg./  moderate LAE/ dilated aortic root, 4.5cm/  mild MV annulus calcification with trace regurg./  trace TR    Allergies: Patient has no known allergies.  Medications: Prior to Admission medications   Medication Sig Start Date End Date Taking? Authorizing Provider  albuterol  (VENTOLIN  HFA) 108 (90 Base) MCG/ACT inhaler Inhale 2 puffs into the lungs every 6 (six) hours as needed for wheezing or shortness of breath. 01/04/24   Hunsucker, Donnice SAUNDERS, MD  BIDIL  20-37.5 MG tablet Take 2 tablets by mouth 3 (three) times daily. 05/31/24   Ladona Heinz, MD  carvedilol  (COREG ) 25 MG tablet TAKE TWO TABLETS BY MOUTH TWICE DAILY with meals Patient taking differently: Take 25 mg by mouth 2 (two) times daily with a meal. 05/01/22   Cantwell, Celeste C, PA-C  clobetasol  ointment (TEMOVATE ) 0.05 % Apply 1 application  topically daily as needed (for itching- Lichen planus). 09/23/13   [provider]  diclofenac sodium (VOLTAREN) 1 % GEL Apply 2 g topically 4 (four) times daily as needed (for pain).    [provider]  Dulaglutide (TRULICITY) 0.75 MG/0.5ML SOAJ Inject 0.75 mg into the skin every Wednesday.    [provider]  felodipine  (PLENDIL ) 10 MG 24 hr tablet Take 10 mg by mouth in the morning.    [provider]  ferrous sulfate  325 (65 FE) MG tablet Take 325 mg by mouth 2 (two) times daily with a meal.    [provider]  fluticasone -salmeterol (ADVAIR) 250-50 MCG/ACT AEPB Inhale one puff by mouth into the lungs in the morning and at bedtime. Patient taking differently: Inhale 1 puff into the lungs in the morning and at bedtime. 07/12/24   Hunsucker, Donnice SAUNDERS, MD  furosemide  (LASIX ) 40 MG tablet Take 1 tablet (40 mg total) by mouth daily. 08/10/24   Rana Lum CROME, NP  HYDROcodone -acetaminophen  (NORCO/VICODIN) 5-325 MG tablet Take 1 tablet by mouth 2 (two) times daily as needed (for pain). 07/27/24   Sebastian Toribio GAILS, MD  lidocaine  (LIDODERM ) 5 % Place 1 patch onto the skin daily. Remove & Discard patch within 12 hours or as directed by MD 07/28/24   Sebastian Toribio GAILS, MD  loratadine (CLARITIN) 10 MG tablet Take 10 mg by mouth every morning.     [provider]  losartan  (COZAAR ) 100 MG tablet Take 100 mg by mouth daily. 07/27/17   [provider]  MEGARED OMEGA-3 KRILL OIL PO Take 1 capsule by mouth daily.    [provider]  methocarbamol  (ROBAXIN ) 500 MG tablet Take 500 mg by  mouth at bedtime as needed for muscle spasms. 01/28/24   [provider]  Multiple Vitamin (MULTIVITAMIN WITH MINERALS) TABS tablet Take 1 tablet by mouth daily with breakfast.    [provider]  omeprazole (PRILOSEC) 20 MG capsule Take 20 mg by mouth daily.    [provider]  AISHA SINKS test strip USE TO TEST BID AS DIRECTED 03/20/16   [provider]  polyethylene glycol (MIRALAX  / GLYCOLAX ) 17 g packet Take 17 g by mouth 2 (two) times daily. 07/27/24   Sebastian Toribio GAILS, MD  potassium chloride  SA (KLOR-CON  M20) 20 MEQ tablet Take 1 tablet (20 mEq total) by mouth daily. 08/10/24 11/08/24  Rana Lum CROME, NP  senna (SENOKOT) 8.6 MG TABS tablet Take 2 tablets (17.2 mg total) by mouth at bedtime. 07/27/24   Sebastian Toribio GAILS, MD  simvastatin  (ZOCOR ) 40 MG tablet Take 40 mg by mouth every evening.  [provider]  spironolactone  (ALDACTONE ) 50 MG tablet Take 0.5 tablets (25 mg total) by mouth daily. 06/06/24   Patwardhan, Manish J, MD  SYSTANE 0.4-0.3 % SOLN Place 1 drop into both eyes 3 (three) times daily as needed (for dryness).    [provider]  TYLENOL  325 MG tablet Take 325-650 mg by mouth every 6 (six) hours as needed for mild pain (pain score 1-3) or headache.    [provider]  urea (CARMOL) 10 % cream Apply 1 application  topically as needed (for dryness).    [provider]  vitamin B-12 (CYANOCOBALAMIN ) 500 MCG tablet Take 500 mcg by mouth daily.    [provider]     Family History  Problem Relation Age of Onset   Hypertension Mother    Diabetes Mellitus II Mother    Heart attack Father    Diabetes Mellitus II Sister    Heart disease Sister    Diabetes Mellitus II Brother    Stroke Brother    Diabetes Mellitus II Brother    Kidney disease Brother    Diabetes Mellitus II Brother    Diabetes Mellitus II Sister    Heart disease Sister    Hypertension Other    Diabetes Mellitus II Other     Stroke Other    Heart disease Other     Social History   Socioeconomic History   Marital status: Married    Spouse name: Not on file   Number of children: 5   Years of education: Not on file   Highest education level: Not on file  Occupational History   Not on file  Tobacco Use   Smoking status: Former    Current packs/day: 0.00    Average packs/day: 1.5 packs/day for 10.0 years (15.0 ttl pk-yrs)    Types: Cigarettes    Start date: 12/13/1961    Quit date: 12/14/1971    Years since quitting: 52.6   Smokeless tobacco: Former    Types: Snuff, Chew  Vaping Use   Vaping status: Never Used  Substance and Sexual Activity   Alcohol  use: Yes    Alcohol /week: 1.0 standard drink of alcohol     Types: 1 Standard drinks or equivalent per week    Comment: one shot qhs   Drug use: No   Sexual activity: Not on file  Other Topics Concern   Not on file  Social History Narrative   Not on file   Social Drivers of Health   Financial Resource Strain: Low Risk  (07/12/2024)   Overall Financial Resource Strain (CARDIA)    Difficulty of Paying Living Expenses: Not very hard  Food Insecurity: No Food Insecurity (07/14/2024)   Hunger Vital Sign    Worried About Running Out of Food in the Last Year: Never true    Ran Out of Food in the Last Year: Never true  Transportation Needs: No Transportation Needs (07/14/2024)   PRAPARE - Administrator, Civil Service (Medical): No    Lack of Transportation (Non-Medical): No  Physical Activity: Inactive (07/12/2024)   Exercise Vital Sign    Days of Exercise per Week: 0 days    Minutes of Exercise per Session: 0 min  Stress: No Stress Concern Present (07/12/2024)   [employer] of Occupational Health - Occupational Stress Questionnaire    Feeling of Stress: Not at all  Social Connections: Socially Integrated (07/14/2024)   Social Connection and Isolation Panel    Frequency of Communication with  Friends and Family: Three times a week     Frequency of Social Gatherings with Friends and Family: Three times a week    Attends Religious Services: 1 to 4 times per year    Active Member of Clubs or Organizations: Yes    Attends Banker Meetings: 1 to 4 times per year    Marital Status: Married       Review of Systems  Vital Signs:   Advance Care Plan: No documents on file    Physical Exam  Imaging: NM PET Image Initial (PI) Skull Base To Thigh Result Date: 08/05/2024 CLINICAL DATA:  Initial treatment strategy for gastric cancer. EXAM: NUCLEAR MEDICINE PET SKULL BASE TO THIGH TECHNIQUE: 15.0 mCi F-18 FDG was injected intravenously. Full-ring PET imaging was performed from the skull base to thigh after the radiotracer. CT data was obtained and used for attenuation correction and anatomic localization. Fasting blood glucose: 97 mg/dl COMPARISON:  CT chest 91/85/7974 and CT chest abdomen pelvis 07/06/2024. FINDINGS: Mediastinal blood pool activity: SUV max 3.0 Liver activity: SUV max NA NECK: No abnormal hypermetabolism. Incidental CT findings: None. CHEST: No abnormal hypermetabolism. Incidental CT findings: Atherosclerotic calcification of the aorta, aortic valve and coronary arteries. Enlarged pulmonic trunk and heart. No pericardial or pleural effusion. ABDOMEN/PELVIS: 3.9 x 6.4 cm gastric antral mass, SUV max 25.9. No associated hypermetabolic lymph nodes. Incidental CT findings: Gallstones. Low-attenuation lesions in the kidneys. No specific follow-up necessary. SKELETON: No abnormal hypermetabolism. Incidental CT findings: Degenerative changes in the spine. IMPRESSION: 1. Hypermetabolic gastric mass.  No evidence of metastatic disease. 2. Cholelithiasis. 3. Aortic atherosclerosis (ICD10-I70.0). Coronary artery calcification. 4. Enlarged pulmonic trunk, indicative of pulmonary arterial hypertension. Electronically Signed   By: Newell Eke M.D.   On: 08/05/2024 13:42   DG CHEST PORT 1 VIEW Result Date:  07/19/2024 CLINICAL DATA:  Lobar pneumonia follow-up. EXAM: PORTABLE CHEST 1 VIEW COMPARISON:  Chest radiograph dated 07/14/2024. FINDINGS: There is mild eventration of the right hemidiaphragm. There is shallow inspiration with bibasilar atelectasis. An infiltrative process at the left lung base is not excluded. No pleural effusion or pneumothorax. Mild cardiomegaly. No acute osseous pathology. IMPRESSION: Shallow inspiration with bibasilar atelectasis. An infiltrative process at the left lung base is not excluded. Electronically Signed   By: Vanetta Chou M.D.   On: 07/19/2024 14:13   CT Angio Chest PE W/Cm &/Or Wo Cm Result Date: 07/14/2024 CLINICAL DATA:  Increased shortness of breath and hypoxia associated with tachypnea EXAM: CT ANGIOGRAPHY CHEST WITH CONTRAST TECHNIQUE: Multidetector CT imaging of the chest was performed using the standard protocol during bolus administration of intravenous contrast. Multiplanar CT image reconstructions and MIPs were obtained to evaluate the vascular anatomy. RADIATION DOSE REDUCTION: This exam was performed according to the departmental dose-optimization program which includes automated exposure control, adjustment of the mA and/or kV according to patient size and/or use of iterative reconstruction technique. CONTRAST:  OMNIPAQUE  IOHEXOL  350 MG/ML SOLN COMPARISON:  CT chest dated 07/06/2024 FINDINGS: Cardiovascular: The study is adequate for the evaluation of pulmonary embolism the level of proximal segmental pulmonary arteries due to motion artifact and timing of contrast bolus. There are no filling defects in the central, lobar, or proximal segmental pulmonary artery branches to suggest acute pulmonary embolism. Main pulmonary artery measures 4.5 cm. Ascending thoracic aorta measures 4.8 x 4.7 cm. Mild multichamber cardiomegaly. No significant pericardial fluid/thickening. Coronary artery calcifications and aortic atherosclerosis. Mediastinum/Nodes: Imaged  thyroid  gland without nodules meeting criteria for imaging follow-up by  size. Normal esophagus. No substantial change in multi station lymphadenopathy, for example 14 mm right low paratracheal (7:48), 12 mm AP window (7:67), and 13 mm right hilar (7:59). Lungs/Pleura: The central airways are patent. Unchanged asymmetric elevation of the right hemidiaphragm. Increased bibasilar subsegmental atelectasis. New peripheral left lower lobe consolidation (9:81). Mild interlobular septal thickening with scattered peribronchovascular ground-glass opacities. No pneumothorax. No pleural effusion. Upper abdomen: Cholelithiasis. Musculoskeletal: No acute or abnormal lytic or blastic osseous lesions. Multilevel degenerative changes of the thoracic spine. Review of the MIP images confirms the above findings. IMPRESSION: 1. No evidence of acute pulmonary embolism to the level of the proximal segmental pulmonary arteries. 2. New peripheral left lower lobe consolidation, suspicious for pneumonia. 3. Mild interlobular septal thickening with scattered peribronchovascular ground-glass opacities, which may represent pulmonary edema. 4. Dilated main pulmonary artery measuring 4.5 cm can be seen in the setting of pulmonary arterial hypertension. 5. Unchanged mediastinal and right hilar lymphadenopathy, likely reactive. 6. 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 Ascending thoracic aortic aneurysm. Recommend semi-annual imaging followup by CTA or MRA and referral to cardiothoracic surgery if not already obtained. This recommendation follows 2010 ACCF/AHA/AATS/ACR/ASA/SCA/SCAI/SIR/STS/SVM Guidelines for the Diagnosis and Management of Patients With Thoracic Aortic Disease. Circulation. 2010; 121: [id number]. Aortic aneurysm NOS (ICD10-I71.9) 7.  Aortic Atherosclerosis (ICD10-I70.0). Electronically Signed   By: Limin  Xu M.D.   On: 07/14/2024 15:35   DG Chest Port 1 View Result Date: 07/14/2024 CLINICAL DATA:  Shortness of breath,  hypoxia. EXAM: PORTABLE CHEST 1 VIEW COMPARISON:  May 31, 2024. FINDINGS: Stable cardiomediastinal silhouette. Hypoinflation of the lungs with minimal bibasilar subsegmental atelectasis. Bony thorax is unremarkable. IMPRESSION: Hypoinflation of the lungs with minimal bibasilar subsegmental atelectasis. Electronically Signed   By: Lynwood Landy Raddle M.D.   On: 07/14/2024 14:06    Labs:  CBC: Recent Labs    07/22/24 0328 07/23/24 0300 07/24/24 0617 07/26/24 0856  WBC 7.4 6.8 7.3 7.4  HGB 7.7* 7.8* 8.0* 7.8*  HCT 27.1* 27.8* 28.6* 27.3*  PLT 218 223 239 231    COAGS: Recent Labs    07/14/24 1344  INR 1.0    BMP: Recent Labs    07/22/24 0328 07/23/24 0300 07/24/24 0617 07/26/24 0856 08/10/24 1227  NA 131* 136 135 136 137  K 3.6 4.1 4.1 3.9 4.3  CL 92* 96* 95* 98 100  CO2 28 32 30 29 23   GLUCOSE 101* 107* 104* 93 87  BUN 14 14 15 15 9   CALCIUM 8.4* 8.5* 8.6* 8.4* 8.5*  CREATININE 1.05 1.38* 1.28* 1.27* 1.48*  GFRNONAA >60 51* 56* 56*  --     LIVER FUNCTION TESTS: Recent Labs    05/31/24 2129 07/12/24 1511 07/23/24 0300 07/24/24 0617  BILITOT 0.3 0.3 0.4 0.7  AST 24 21 39 36  ALT 14 12 38 33  ALKPHOS 56 53 38 40  PROT 7.1 6.6 6.6 6.7  ALBUMIN  3.8 3.5 2.8* 2.8*    TUMOR MARKERS: Recent Labs    07/12/24 1511  CEA <1.00    Assessment and Plan: 83 y.o. male with past medical history significant for AAA, hypertension, coronary artery disease/heart failure, anemia, chronic kidney disease, diabetes, hearing difficulty, hyperlipidemia, obesity, PE 2022, osteoarthritis, sleep apnea who presents now with newly diagnosed gastric cancer.  He has poor venous access and is scheduled today for Port-A-Cath placement to assist with treatment.  He has recovered from a recent hospital admission with pneumonia and respiratory failure.Risks and benefits of image guided port-a-catheter placement  was discussed with the patient including, but not limited to bleeding, infection,  pneumothorax, or fibrin sheath development and need for additional procedures.  All of the patient's questions were answered, patient is agreeable to proceed. Consent signed and in chart.    Thank you for allowing our service to participate in Bradley Alvarez 's care.  Electronically Signed: D. Franky Rakers, PA-C   08/11/2024, 5:51 PM      I spent a total of  20 minutes   in face to face in clinical consultation, greater than 50% of which was counseling/coordinating care for port a cath placement

## 2024-08-12 ENCOUNTER — Encounter: Payer: Self-pay | Admitting: Oncology

## 2024-08-12 ENCOUNTER — Ambulatory Visit (HOSPITAL_COMMUNITY)
Admission: RE | Admit: 2024-08-12 | Discharge: 2024-08-12 | Disposition: A | Discharge status: Home / Self Care | Source: Ambulatory Visit | Attending: Oncology | Admitting: Oncology

## 2024-08-12 ENCOUNTER — Encounter: Payer: Self-pay | Admitting: *Deleted

## 2024-08-12 DIAGNOSIS — C162 Malignant neoplasm of body of stomach: Secondary | ICD-10-CM | POA: Diagnosis not present

## 2024-08-12 DIAGNOSIS — Z66 Do not resuscitate: Secondary | ICD-10-CM | POA: Insufficient documentation

## 2024-08-12 DIAGNOSIS — Z87891 Personal history of nicotine dependence: Secondary | ICD-10-CM | POA: Insufficient documentation

## 2024-08-12 DIAGNOSIS — C169 Malignant neoplasm of stomach, unspecified: Secondary | ICD-10-CM | POA: Diagnosis not present

## 2024-08-12 HISTORY — PX: IR IMAGING GUIDED PORT INSERTION: IMG5740

## 2024-08-12 LAB — GLUCOSE, CAPILLARY: Glucose-Capillary: 91 mg/dL (ref 70–99)

## 2024-08-12 MED ORDER — LIDOCAINE-EPINEPHRINE 1 %-1:100000 IJ SOLN
20.0000 mL | Freq: Once | INTRAMUSCULAR | Status: AC
  Administered 2024-08-12: 20 mL via INTRADERMAL

## 2024-08-12 MED ORDER — SODIUM CHLORIDE 0.9 % IV SOLN: INTRAVENOUS | Status: DC

## 2024-08-12 MED ORDER — LIDOCAINE-EPINEPHRINE 1 %-1:100000 IJ SOLN
INTRAMUSCULAR | Status: AC
Start: 2024-08-12 — End: 2024-08-12
  Filled 2024-08-12: qty 1

## 2024-08-12 MED ORDER — FENTANYL CITRATE (PF) 100 MCG/2ML IJ SOLN
INTRAMUSCULAR | Status: AC | PRN
  Administered 2024-08-12: 50 ug via INTRAVENOUS

## 2024-08-12 MED ORDER — HEPARIN SOD (PORK) LOCK FLUSH 100 UNIT/ML IV SOLN
INTRAVENOUS | Status: AC
Start: 2024-08-12 — End: 2024-08-12
  Filled 2024-08-12: qty 5

## 2024-08-12 MED ORDER — FENTANYL CITRATE (PF) 100 MCG/2ML IJ SOLN
INTRAMUSCULAR | Status: AC
  Filled 2024-08-12: qty 2

## 2024-08-12 MED ORDER — HEPARIN SOD (PORK) LOCK FLUSH 100 UNIT/ML IV SOLN
500.0000 [IU] | Freq: Once | INTRAVENOUS | Status: AC
  Administered 2024-08-12: 500 [IU] via INTRAVENOUS

## 2024-08-12 MED ORDER — MIDAZOLAM HCL 2 MG/2ML IJ SOLN
INTRAMUSCULAR | Status: AC | PRN
  Administered 2024-08-12 (×2): 1 mg via INTRAVENOUS

## 2024-08-12 MED ORDER — MIDAZOLAM HCL 2 MG/2ML IJ SOLN
INTRAMUSCULAR | Status: AC
  Filled 2024-08-12: qty 2

## 2024-08-12 NOTE — Discharge Instructions (Signed)
 Implanted Port Insertion, Care After  The following information offers guidance on how to care for yourself after your procedure. Your health care provider may also give you more specific instructions. If you have problems or questions, contact your health care provider.  What can I expect after the procedure? After the procedure, it is common to have: Discomfort at the port insertion site. Bruising on the skin over the port. This should improve over 3-4 days.   Urgent needs - Interventional Radiology, clinic [phone] (mon-fri 8-5).   Wound - May remove dressing and shower in 24 to 48 hours.  Keep site clean and dry.  Replace with bandaid as needed.  Do not submerge in tub or water until site healing well. If closed with glue, glue will flake off on its own.   If ordered by your provider, may start Emla cream (or any other creams ointments or lotions) in 2 weeks or after incision is healed. Port is ready for use immediately.   After completion of treatment, your provider should have you set up for monthly port flushes.   Follow these instructions at home: [facility] care After your port is placed, you will get a manufacturer's information card. The card has information about your port. Keep this card with you at all times. Take care of the port as told by your health care provider. Ask your health care provider if you or a family member can get training for taking care of the port at home. A home health care nurse will be be available to help care for the port. Make sure to remember what type of port you have. Incision care     Follow instructions from your health care provider about how to take care of your port insertion site. Make sure you: Wash your hands with soap and water for at least 20 seconds before and after you change your bandage (dressing). If soap and water are not available, use hand sanitizer. Change your dressing as told by your health care provider. Leave stitches  (sutures), skin glue, or adhesive strips in place. These skin closures may need to stay in place for 2 weeks or longer. If adhesive strip edges start to loosen and curl up, you may trim the loose edges. Do not remove adhesive strips completely unless your health care provider tells you to do that. Check your port insertion site every day for signs of infection. Check for: Redness, swelling, or pain. Fluid or blood. Warmth. Pus or a bad smell. Activity Return to your normal activities as told by your health care provider. Ask your health care provider what activities are safe for you. You may have to avoid lifting. Ask your health care provider how much you can safely lift. General instructions Take over-the-counter and prescription medicines only as told by your health care provider. Do not take baths, swim, or use a hot tub until your health care provider approves. Ask your health care provider if you may take showers. You may only be allowed to take sponge baths. If you were given a sedative during the procedure, it can affect you for several hours. Do not drive or operate machinery until your health care provider says that it is safe. Wear a medical alert bracelet in case of an emergency. This will tell any health care providers that you have a port. Keep all follow-up visits. This is important. Contact a health care provider if: You cannot flush your port with saline as directed, or you cannot  draw blood from the port. You have a fever or chills. You have redness, swelling, or pain around your port insertion site. You have fluid or blood coming from your port insertion site. Your port insertion site feels warm to the touch. You have pus or a bad smell coming from the port insertion site. Get help right away if: You have chest pain or shortness of breath. You have bleeding from your port that you cannot control. These symptoms may be an emergency. Get help right away. Call 911. Do not  wait to see if the symptoms will go away. Do not drive yourself to the hospital. Summary Take care of the port as told by your health care provider. Keep the manufacturer's information card with you at all times. Change your dressing as told by your health care provider. Contact a health care provider if you have a fever or chills or if you have redness, swelling, or pain around your port insertion site. Keep all follow-up visits. This information is not intended to replace advice given to you by your health care provider. Make sure you discuss any questions you have with your health care provider. Document Revised: 05/21/2021 Document Reviewed: 05/21/2021 Elsevier Patient Education  2023 Elsevier Inc.    Moderate Conscious Sedation  Adult  Care After (English)  After the procedure, it is common to have: Sleepiness for a few hours. Impaired judgment for a few hours. Trouble with balance. Nausea or vomiting if you eat too soon. Follow these instructions at home: For the time period you were told by your health care provider:  Rest. Do not participate in activities where you could fall or become injured. Do not drive or use machinery. Do not drink alcohol. Do not take sleeping pills or medicines that cause drowsiness. Do not make important decisions or sign legal documents. Do not take care of children on your own. Eating and drinking Follow instructions from your health care provider about what you may eat and drink. Drink enough fluid to keep your urine pale yellow. If you vomit: Drink clear fluids slowly and in small amounts as you are able. Clear fluids include water, ice chips, low-calorie sports drinks, and fruit juice that has water added to it (diluted fruit juice). Eat light and bland foods in small amounts as you are able. These foods include bananas, applesauce, rice, lean meats, toast, and crackers. General instructions Take over-the-counter and prescription medicines  only as told by your health care provider. Have a responsible adult stay with you for the time you are told. Do not use any products that contain nicotine or tobacco. These products include cigarettes, chewing tobacco, and vaping devices, such as e-cigarettes. If you need help quitting, ask your health care provider. Return to your normal activities as told by your health care provider. Ask your health care provider what activities are safe for you. Your health care provider may give you more instructions. Make sure you know what you can and cannot do. Contact a health care provider if: You are still sleepy or having trouble with balance after 24 hours. You feel light-headed. You vomit every time you eat or drink. You get a rash. You have a fever. You have redness or swelling around the IV site. Get help right away if: You have trouble breathing. You start to feel confused at home. These symptoms may be an emergency. Get help right away. Call 911. Do not wait to see if the symptoms will go away. Do not  drive yourself to the hospital. This information is not intended to replace advice given to you by your health care provider. Make sure you discuss any questions you have with your health care provider.

## 2024-08-12 NOTE — Progress Notes (Signed)
 Pt arrived at 1555

## 2024-08-12 NOTE — Sedation Documentation (Signed)
 RN Jacquez Sheetz pulled 2 mg Versed  and 100 mcg Fentanyl  in Ir room. Pt. Received 2 mg Versed  and 100 mcg Fentanyl  throughout the procedure.

## 2024-08-12 NOTE — Procedures (Signed)
  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 Postprocedure diagnosis: same EBL:    minimal Complications:   none immediate  See full dictation in [employer].  CHARM Toribio Faes MD Main # [phone] Pager  [phone] Mobile [phone]

## 2024-08-12 NOTE — Progress Notes (Signed)
 Was called by Nat from [place] [phone]) with following questions to get Berton approved: Is tx neoadjuvant, adjuvant Neither-primary tx in [facility] patient. Non surgical candidate Is he medically fit for surgery No Clinical setting of unresectable, recurrent or metastatic Unresectable Will it be give w/Opdivo Yes  Called back with MD answers and was told it was already approved from 08/12/24 to 08/07/25. Was given auth #[id number]. This was reported to managed care.

## 2024-08-14 ENCOUNTER — Other Ambulatory Visit: Payer: Self-pay | Admitting: Oncology

## 2024-08-15 ENCOUNTER — Other Ambulatory Visit (HOSPITAL_BASED_OUTPATIENT_CLINIC_OR_DEPARTMENT_OTHER): Payer: Self-pay

## 2024-08-15 ENCOUNTER — Inpatient Hospital Stay

## 2024-08-15 ENCOUNTER — Ambulatory Visit

## 2024-08-15 ENCOUNTER — Other Ambulatory Visit

## 2024-08-15 ENCOUNTER — Other Ambulatory Visit: Payer: Self-pay | Admitting: *Deleted

## 2024-08-15 MED ORDER — ONDANSETRON HCL 8 MG PO TABS
8.0000 mg | ORAL_TABLET | Freq: Three times a day (TID) | ORAL | 2 refills | Status: AC | PRN
  Filled 2024-08-15: qty 20, 7d supply, fill #0

## 2024-08-15 MED ORDER — LIDOCAINE-PRILOCAINE 2.5-2.5 % EX CREA
1.0000 | TOPICAL_CREAM | CUTANEOUS | 0 refills | Status: DC | PRN
  Filled 2024-08-15: qty 30, 30d supply, fill #0

## 2024-08-15 NOTE — Progress Notes (Signed)
 PATIENT NAVIGATOR PROGRESS NOTE  Name: Bradley Alvarez Date: 08/15/2024 MRN: [id number]  DOB: [birth year]   Reason for visit:  Patient ed session  Comments:  Please see education note    Time spent counseling/coordinating care: > 60 minutes

## 2024-08-16 ENCOUNTER — Telehealth: Payer: Self-pay

## 2024-08-16 ENCOUNTER — Other Ambulatory Visit: Payer: Self-pay | Admitting: *Deleted

## 2024-08-16 DIAGNOSIS — I10 Essential (primary) hypertension: Secondary | ICD-10-CM

## 2024-08-16 DIAGNOSIS — G4733 Obstructive sleep apnea (adult) (pediatric): Secondary | ICD-10-CM | POA: Diagnosis not present

## 2024-08-16 DIAGNOSIS — I2699 Other pulmonary embolism without acute cor pulmonale: Secondary | ICD-10-CM | POA: Diagnosis not present

## 2024-08-16 DIAGNOSIS — E785 Hyperlipidemia, unspecified: Secondary | ICD-10-CM

## 2024-08-16 NOTE — Telephone Encounter (Signed)
 [facility] CSW Progress Note  Clinical Social Worker returned patients's spouse 9/16 voicemail about SNAP paperwork. CSW not at [facility] location during their 9/17 appointment. CSW / Spouse scheduled telephone appointment for 9/18 to complete paperwork over the phone, for patient to return to CSW at later date.   Follow Up Plan:  CSW will follow-up with patient by phone   Lizbeth Sprague, LCSW Clinical Social Worker [facility]

## 2024-08-17 ENCOUNTER — Inpatient Hospital Stay

## 2024-08-17 ENCOUNTER — Encounter: Payer: Self-pay | Admitting: Nurse Practitioner

## 2024-08-17 ENCOUNTER — Other Ambulatory Visit

## 2024-08-17 ENCOUNTER — Inpatient Hospital Stay: Admitting: Nurse Practitioner

## 2024-08-17 VITALS — BP 109/65 | HR 69 | Temp 97.9°F | Resp 18 | Ht 74.0 in | Wt 302.7 lb

## 2024-08-17 VITALS — BP 156/72 | HR 60 | Temp 98.1°F | Resp 16

## 2024-08-17 DIAGNOSIS — I509 Heart failure, unspecified: Secondary | ICD-10-CM | POA: Diagnosis not present

## 2024-08-17 DIAGNOSIS — I7121 Aneurysm of the ascending aorta, without rupture: Secondary | ICD-10-CM | POA: Diagnosis not present

## 2024-08-17 DIAGNOSIS — C169 Malignant neoplasm of stomach, unspecified: Secondary | ICD-10-CM | POA: Diagnosis not present

## 2024-08-17 DIAGNOSIS — Z86711 Personal history of pulmonary embolism: Secondary | ICD-10-CM | POA: Diagnosis not present

## 2024-08-17 DIAGNOSIS — Z5112 Encounter for antineoplastic immunotherapy: Secondary | ICD-10-CM | POA: Diagnosis not present

## 2024-08-17 DIAGNOSIS — G4733 Obstructive sleep apnea (adult) (pediatric): Secondary | ICD-10-CM | POA: Diagnosis not present

## 2024-08-17 DIAGNOSIS — G4730 Sleep apnea, unspecified: Secondary | ICD-10-CM | POA: Diagnosis not present

## 2024-08-17 DIAGNOSIS — N189 Chronic kidney disease, unspecified: Secondary | ICD-10-CM | POA: Diagnosis not present

## 2024-08-17 DIAGNOSIS — I130 Hypertensive heart and chronic kidney disease with heart failure and stage 1 through stage 4 chronic kidney disease, or unspecified chronic kidney disease: Secondary | ICD-10-CM | POA: Diagnosis not present

## 2024-08-17 DIAGNOSIS — I2510 Atherosclerotic heart disease of native coronary artery without angina pectoris: Secondary | ICD-10-CM | POA: Diagnosis not present

## 2024-08-17 DIAGNOSIS — Z1732 Human epidermal growth factor receptor 2 negative status: Secondary | ICD-10-CM | POA: Diagnosis not present

## 2024-08-17 DIAGNOSIS — D509 Iron deficiency anemia, unspecified: Secondary | ICD-10-CM | POA: Diagnosis not present

## 2024-08-17 LAB — CMP (CANCER CENTER ONLY)
ALT: 7 U/L (ref 0–44)
AST: 20 U/L (ref 15–41)
Albumin: 3.6 g/dL (ref 3.5–5.0)
Alkaline Phosphatase: 49 U/L (ref 38–126)
Anion gap: 10 (ref 5–15)
BUN: 8 mg/dL (ref 8–23)
CO2: 26 mmol/L (ref 22–32)
Calcium: 9 mg/dL (ref 8.9–10.3)
Chloride: 101 mmol/L (ref 98–111)
Creatinine: 1.24 mg/dL (ref 0.61–1.24)
GFR, Estimated: 58 mL/min — ABNORMAL LOW (ref >60)
Glucose, Bld: 116 mg/dL — ABNORMAL HIGH (ref 70–99)
Potassium: 4.3 mmol/L (ref 3.5–5.1)
Sodium: 137 mmol/L (ref 135–145)
Total Bilirubin: 0.4 mg/dL (ref 0.0–1.2)
Total Protein: 6.8 g/dL (ref 6.5–8.1)

## 2024-08-17 LAB — CBC WITH DIFFERENTIAL (CANCER CENTER ONLY)
Abs Immature Granulocytes: 0.03 K/uL (ref 0.00–0.07)
Basophils Absolute: 0 K/uL (ref 0.0–0.1)
Basophils Relative: 0 %
Eosinophils Absolute: 0.2 K/uL (ref 0.0–0.5)
Eosinophils Relative: 3 %
HCT: 31.5 % — ABNORMAL LOW (ref 39.0–52.0)
Hemoglobin: 9.4 g/dL — ABNORMAL LOW (ref 13.0–17.0)
Immature Granulocytes: 1 %
Lymphocytes Relative: 19 %
Lymphs Abs: 1.1 K/uL (ref 0.7–4.0)
MCH: 23 pg — ABNORMAL LOW (ref 26.0–34.0)
MCHC: 29.8 g/dL — ABNORMAL LOW (ref 30.0–36.0)
MCV: 77.2 fL — ABNORMAL LOW (ref 80.0–100.0)
Monocytes Absolute: 1.1 K/uL — ABNORMAL HIGH (ref 0.1–1.0)
Monocytes Relative: 20 %
Neutro Abs: 3.2 K/uL (ref 1.7–7.7)
Neutrophils Relative %: 57 %
Platelet Count: 187 K/uL (ref 150–400)
RBC: 4.08 MIL/uL — ABNORMAL LOW (ref 4.22–5.81)
RDW: 20.9 % — ABNORMAL HIGH (ref 11.5–15.5)
WBC Count: 5.6 K/uL (ref 4.0–10.5)
nRBC: 0 % (ref 0.0–0.2)

## 2024-08-17 LAB — TSH: TSH: 2.24 u[IU]/mL (ref 0.350–4.500)

## 2024-08-17 MED ORDER — SODIUM CHLORIDE 0.9 % IV SOLN: INTRAVENOUS | Status: DC

## 2024-08-17 MED ORDER — FAMOTIDINE IN NACL 20-0.9 MG/50ML-% IV SOLN
20.0000 mg | Freq: Once | INTRAVENOUS | Status: AC
  Administered 2024-08-17: 20 mg via INTRAVENOUS
  Filled 2024-08-17: qty 50

## 2024-08-17 MED ORDER — DIPHENHYDRAMINE HCL 50 MG/ML IJ SOLN
25.0000 mg | Freq: Once | INTRAMUSCULAR | Status: AC
  Administered 2024-08-17: 25 mg via INTRAVENOUS
  Filled 2024-08-17: qty 1

## 2024-08-17 MED ORDER — SODIUM CHLORIDE 0.9 % IV SOLN
1.0000 mg/kg | Freq: Once | INTRAVENOUS | Status: AC
  Administered 2024-08-17: 150 mg via INTRAVENOUS
  Filled 2024-08-17: qty 30

## 2024-08-17 MED ORDER — SODIUM CHLORIDE 0.9 % IV SOLN
240.0000 mg | Freq: Once | INTRAVENOUS | Status: AC
  Administered 2024-08-17: 240 mg via INTRAVENOUS
  Filled 2024-08-17: qty 24

## 2024-08-17 NOTE — Progress Notes (Signed)
 Patient seen by [name] NP today  Vitals are within treatment parameters:Yes   Labs are within treatment parameters: Yes   Treatment plan has been signed: Yes   Per physician team, Patient is ready for treatment and there are NO modifications to the treatment plan.

## 2024-08-17 NOTE — Progress Notes (Signed)
 Per Tara Nivo is approved

## 2024-08-17 NOTE — Patient Instructions (Signed)
 [facility] - A DEPT OF [facility]  Discharge Instructions: Thank you for choosing [facility] to provide your oncology and hematology care.   If you have a lab appointment with the Cancer Center, please go directly to the Cancer Center and check in at the registration area.   Wear comfortable clothing and clothing appropriate for easy access to any Portacath or PICC line.   We strive to give you quality time with your provider. You may need to reschedule your appointment if you arrive late (15 or more minutes).  Arriving late affects you and other patients whose appointments are after yours.  Also, if you miss three or more appointments without notifying the office, you may be dismissed from the clinic at the provider's discretion.      For prescription refill requests, have your pharmacy contact our office and allow 72 hours for refills to be completed.    Today you received the following chemotherapy and/or immunotherapy agents: nivolumab   (Opdivo ), ipilimumab  (yervoy )  Ipilimumab  Injection What is this medication? IPILIMUMAB  (IP i LIM ue mab) treats some types of cancer. It works by helping your immune system slow or stop the spread of cancer cells. It is a monoclonal antibody. This medicine may be used for other purposes; ask your health care provider or pharmacist if you have questions. COMMON BRAND NAME(S): YERVOY  What should I tell my care team before I take this medication? They need to know if you have any of these conditions: Allogeneic stem cell transplant (uses someone else's stem cells) Autoimmune diseases, such as Crohn disease, ulcerative colitis, lupus Nervous system problems, such as Guillain-Barre syndrome or myasthenia gravis Organ transplant An unusual or allergic reaction to ipilimumab , other medications, foods, dyes, or preservatives Pregnant or trying to get pregnant Breast-feeding How should I use this  medication? This medication is infused into a vein. It is given by your care team in a hospital or clinic setting. A special MedGuide will be given to you before each treatment. Be sure to read this information carefully each time. Talk to your care team about the use of this medication in children. While it may be prescribed for children as young as 12 years for selected conditions, precautions do apply. Overdosage: If you think you have taken too much of this medicine contact a poison control center or emergency room at once. NOTE: This medicine is only for you. Do not share this medicine with others. What if I miss a dose? Keep appointments for follow-up doses. It is important not to miss your dose. Call your care team if you are unable to keep an appointment. What may interact with this medication? Interactions are not expected. This list may not describe all possible interactions. Give your health care provider a list of all the medicines, herbs, non-prescription drugs, or dietary supplements you use. Also tell them if you smoke, drink alcohol , or use illegal drugs. Some items may interact with your medicine. What should I watch for while using this medication? Your condition will be monitored carefully while you are receiving this medication. You may need blood work while taking this medication. This medication may cause serious skin reactions. They can happen weeks to months after starting the medication. Contact your care team right away if you notice fevers or flu-like symptoms with a rash. The rash may be red or purple and then turn into blisters or peeling of the skin. You may also notice a red  rash with swelling of the face, lips, or lymph nodes in your neck or under your arms. Tell your care team right away if you have any change in your eyesight. Talk to your care team if you may be pregnant. Serious birth defects can occur if you take this medication during pregnancy and for 3 months  after the last dose. You will need a negative pregnancy test before starting this medication. Contraception is recommended while taking this medication and for 3 months after the last dose. Your care team can help you find the option that works for you. Do not breastfeed while taking this medication and for 3 months after the last dose. What side effects may I notice from receiving this medication? Side effects that you should report to your care team as soon as possible: Allergic reactions--skin rash, itching, hives, swelling of the face, lips, tongue, or throat Dry cough, shortness of breath or trouble breathing Eye pain, redness, irritation, or discharge with blurry or decreased vision Heart muscle inflammation--unusual weakness or fatigue, shortness of breath, chest pain, fast or irregular heartbeat, dizziness, swelling of the ankles, feet, or hands Hormone gland problems--headache, sensitivity to light, unusual weakness or fatigue, dizziness, fast or irregular heartbeat, increased sensitivity to cold or heat, excessive sweating, constipation, hair loss, increased thirst or amount of urine, tremors or shaking, irritability Infusion reactions--chest pain, shortness of breath or trouble breathing, feeling faint or lightheaded Kidney injury (glomerulonephritis)--decrease in the amount of urine, red or dark brown urine, foamy or bubbly urine, swelling of the ankles, hands, or feet Liver injury--right upper belly pain, loss of appetite, nausea, light-colored stool, dark yellow or brown urine, yellowing skin or eyes, unusual weakness or fatigue Pain, tingling, or numbness in the hands or feet, muscle weakness, change in vision, confusion or trouble speaking, loss of balance or coordination, trouble walking, seizures Rash, fever, and swollen lymph nodes Redness, blistering, peeling, or loosening of the skin, including inside the mouth Sudden or severe stomach pain, bloody diarrhea, fever, nausea,  vomiting Side effects that usually do not require medical attention (report to your care team if they continue or are bothersome): Bone, joint, or muscle pain Diarrhea Fatigue Loss of appetite Nausea Skin rash This list may not describe all possible side effects. Call your doctor for medical advice about side effects. You may report side effects to FDA at 1-800-FDA-1088. Where should I keep my medication? This medication is given in a hospital or clinic. It will not be stored at home. NOTE: This sheet is a summary. It may not cover all possible information. If you have questions about this medicine, talk to your doctor, pharmacist, or health care provider.  2024 Elsevier/Gold Standard (2022-04-04 00:00:00)  Nivolumab  Injection What is this medication? NIVOLUMAB  (nye VOL ue mab) treats some types of cancer. It works by helping your immune system slow or stop the spread of cancer cells. It is a monoclonal antibody. This medicine may be used for other purposes; ask your health care provider or pharmacist if you have questions. COMMON BRAND NAME(S): Opdivo  What should I tell my care team before I take this medication? They need to know if you have any of these conditions: Allogeneic stem cell transplant (uses someone else's stem cells) Autoimmune diseases, such as Crohn disease, ulcerative colitis, lupus History of chest radiation Nervous system problems, such as Guillain-Barre syndrome or myasthenia gravis Organ transplant An unusual or allergic reaction to nivolumab , other medications, foods, dyes, or preservatives Pregnant or trying to get pregnant  Breast-feeding How should I use this medication? This medication is infused into a vein. It is given in a hospital or clinic setting. A special MedGuide will be given to you before each treatment. Be sure to read this information carefully each time. Talk to your care team about the use of this medication in children. While it may be  prescribed for children as young as 12 years for selected conditions, precautions do apply. Overdosage: If you think you have taken too much of this medicine contact a poison control center or emergency room at once. NOTE: This medicine is only for you. Do not share this medicine with others. What if I miss a dose? Keep appointments for follow-up doses. It is important not to miss your dose. Call your care team if you are unable to keep an appointment. What may interact with this medication? Interactions have not been studied. This list may not describe all possible interactions. Give your health care provider a list of all the medicines, herbs, non-prescription drugs, or dietary supplements you use. Also tell them if you smoke, drink alcohol , or use illegal drugs. Some items may interact with your medicine. What should I watch for while using this medication? Your condition will be monitored carefully while you are receiving this medication. You may need blood work while taking this medication. This medication may cause serious skin reactions. They can happen weeks to months after starting the medication. Contact your care team right away if you notice fevers or flu-like symptoms with a rash. The rash may be red or purple and then turn into blisters or peeling of the skin. You may also notice a red rash with swelling of the face, lips, or lymph nodes in your neck or under your arms. Tell your care team right away if you have any change in your eyesight. Talk to your care team if you are pregnant or think you might be pregnant. A negative pregnancy test is required before starting this medication. A reliable form of contraception is recommended while taking this medication and for 5 months after the last dose. Talk to your care team about effective forms of contraception. Do not breast-feed while taking this medication and for 5 months after the last dose. What side effects may I notice from receiving  this medication? Side effects that you should report to your care team as soon as possible: Allergic reactions--skin rash, itching, hives, swelling of the face, lips, tongue, or throat Dry cough, shortness of breath or trouble breathing Eye pain, redness, irritation, or discharge with blurry or decreased vision Heart muscle inflammation--unusual weakness or fatigue, shortness of breath, chest pain, fast or irregular heartbeat, dizziness, swelling of the ankles, feet, or hands Hormone gland problems--headache, sensitivity to light, unusual weakness or fatigue, dizziness, fast or irregular heartbeat, increased sensitivity to cold or heat, excessive sweating, constipation, hair loss, increased thirst or amount of urine, tremors or shaking, irritability Infusion reactions--chest pain, shortness of breath or trouble breathing, feeling faint or lightheaded Kidney injury (glomerulonephritis)--decrease in the amount of urine, red or dark brown urine, foamy or bubbly urine, swelling of the ankles, hands, or feet Liver injury--right upper belly pain, loss of appetite, nausea, light-colored stool, dark yellow or brown urine, yellowing skin or eyes, unusual weakness or fatigue Pain, tingling, or numbness in the hands or feet, muscle weakness, change in vision, confusion or trouble speaking, loss of balance or coordination, trouble walking, seizures Rash, fever, and swollen lymph nodes Redness, blistering, peeling, or loosening of  the skin, including inside the mouth Sudden or severe stomach pain, bloody diarrhea, fever, nausea, vomiting Side effects that usually do not require medical attention (report these to your care team if they continue or are bothersome): Bone, joint, or muscle pain Diarrhea Fatigue Loss of appetite Nausea Skin rash This list may not describe all possible side effects. Call your doctor for medical advice about side effects. You may report side effects to FDA at 1-800-FDA-1088. Where  should I keep my medication? This medication is given in a hospital or clinic. It will not be stored at home. NOTE: This sheet is a summary. It may not cover all possible information. If you have questions about this medicine, talk to your doctor, pharmacist, or health care provider.  2024 Elsevier/Gold Standard (2022-03-17 00:00:00)      To help prevent nausea and vomiting after your treatment, we encourage you to take your nausea medication as directed.  BELOW ARE SYMPTOMS THAT SHOULD BE REPORTED IMMEDIATELY: *FEVER GREATER THAN 100.4 F (38 C) OR HIGHER *CHILLS OR SWEATING *NAUSEA AND VOMITING THAT IS NOT CONTROLLED WITH YOUR NAUSEA MEDICATION *UNUSUAL SHORTNESS OF BREATH *UNUSUAL BRUISING OR BLEEDING *URINARY PROBLEMS (pain or burning when urinating, or frequent urination) *BOWEL PROBLEMS (unusual diarrhea, constipation, pain near the anus) TENDERNESS IN MOUTH AND THROAT WITH OR WITHOUT PRESENCE OF ULCERS (sore throat, sores in mouth, or a toothache) UNUSUAL RASH, SWELLING OR PAIN  UNUSUAL VAGINAL DISCHARGE OR ITCHING   Items with * indicate a potential emergency and should be followed up as soon as possible or go to the Emergency Department if any problems should occur.  Please show the CHEMOTHERAPY ALERT CARD or IMMUNOTHERAPY ALERT CARD at check-in to the Emergency Department and triage nurse.  Should you have questions after your visit or need to cancel or reschedule your appointment, please contact [facility]  Dept: [phone]  and follow the prompts.  Office hours are 8:00 a.m. to 4:30 p.m. Monday - Friday. Please note that voicemails left after 4:00 p.m. may not be returned until the following business day.  We are closed weekends and major holidays. You have access to a nurse at all times for urgent questions. Please call the main number to the clinic Dept: [phone] and follow the prompts.   For any non-urgent  questions, you may also contact your provider using MyChart. We now offer e-Visits for anyone 19 and older to request care online for non-urgent symptoms. For details visit [email or web address].   Also download the MyChart app! Go to the app store, search MyChart, open the app, select [facility], and log in with your MyChart username and password.

## 2024-08-17 NOTE — Patient Instructions (Signed)

## 2024-08-17 NOTE — Progress Notes (Signed)
 Ipilimumab  (YERVOY ) Patient Monitoring Assessment   Is the patient experiencing any of the following general symptoms?:  [YES]Difficulty performing normal activities - HAS BACK AND KNEE PROBLEMS [YES]Feeling sluggish or cold all the time [NO]Unusual weight gain [NO]Constant or unusual headaches [NO]Feeling dizzy or faint [NO]Changes in eyesight (blurry vision, double vision, or other vision problems) [NO]Changes in mood or behavior (ex: decreased sex drive, irritability, or forgetfulness) [NO]Starting new medications (ex: steroids, other medications that lower immune response) [ ] Patient is not experiencing any of the general symptoms above.   Gastrointestinal  Patient is having 1 bowel movements each day.  Is this different from baseline? [ ] Yes [ ] No Are your stools watery or do they have a foul smell? [ ] Yes [ ] No Have you seen blood in your stools? [ ] Yes [ ] No Are your stools dark, tarry, or sticky? [ ] Yes [ ] No Are you having pain or tenderness in your belly? [ ] Yes [ ] No  Skin Does your skin itch? [ ] Yes [ ] No Do you have a rash? [ ] Yes [ ] No Has your skin blistered and/or peeled? [ ] Yes [ ] No Do you have sores in your mouth? [ ] Yes [ ] No  Hepatic Has your urine been dark or tea colored? [ ] Yes [ ] No Have you noticed that your skin or the whites of your eyes are turning yellow? [ ] Yes [ ] No Are you bleeding or bruising more easily than normal? [ ] Yes [ ] No Are you nauseous and/or vomiting? [ ] Yes [ ] No Do you have pain on the right side of your stomach? [ ] Yes [ ] No  Neurologic  Are you having unusual weakness of legs, arms, or face? [ ] Yes [ ] No Are you having numbness or tingling in your hands or feet? [ ] Yes [ ] No  Caron Tardif A Nyari Olsson

## 2024-08-17 NOTE — Progress Notes (Signed)
  [facility] OFFICE PROGRESS NOTE   Diagnosis: Gastric cancer  INTERVAL HISTORY:   Bradley Alvarez returns as scheduled.  He is seen prior to proceeding with cycle 1 day 1 ipilimumab /nivolumab .  Energy level is better.  He has a good appetite.  He denies bleeding.  He is less short of breath.  He denies pain.  No diarrhea.  No rash.  He is taking oral iron , tolerating well with the exception of mild constipation.  He takes a stool softener and laxative if needed.  Objective:  Vital signs in last 24 hours:  Blood pressure 109/65, pulse 69, temperature 97.9 F (36.6 C), temperature source Temporal, resp. rate 18, height 6' 2 (1.88 m), weight (!) 302 lb 11.2 oz (137.3 kg), SpO2 93%.    HEENT: No thrush or ulcers. Resp: Lungs clear bilaterally. Cardio: Regular rate and rhythm. GI: No hepatosplenomegaly.  Nontender. Vascular: Trace lower leg edema bilaterally. Skin: No rash. Port-A-Cath without erythema.  Lab Results:  Lab Results  Component Value Date   WBC 5.6 08/17/2024   HGB 9.4 (L) 08/17/2024   HCT 31.5 (L) 08/17/2024   MCV 77.2 (L) 08/17/2024   PLT 187 08/17/2024   NEUTROABS 3.2 08/17/2024    Imaging:  No results found.  Medications: I have reviewed the patient's current medications.  Assessment/Plan: Gastric cancer Upper endoscopy 07/01/2024-large fungating, ulcerated, noncircumferential mass on the lesser curvature of the stomach.  Pathology-invasive moderately differentiated adenocarcinoma; HER2 negative, loss of MLH1 and PMS2, Claudin 18-negative, PD-L1 CPS-50, MLH1 methylation detected, BRAF-negative; foundation 1-MSI high, tumor mutation burden 41 CTs 07/06/2024-enhancing lesser curvature wall thickening/mass; mesenteric root fat stranding and multiple retroperitoneal/periaortic and celiac lymph nodes; new right upper lobe upper lobe pulmonary nodule; multiple prominent mediastinal lymph nodes 07/29/2024 PET scan-hypermetabolic gastric mass.  No evidence  of metastatic disease. Cycle 1 ipilimumab /nivolumab  08/17/2024 Iron  deficiency anemia  Oral iron  initiated Hemoglobin improved 08/17/2024 CAD/CHF Diabetes Hypertension CKD Obstructive sleep apnea History of pulmonary embolus August 2022-previously on apixaban .  Apixaban  stopped due to bleeding. Ascending aortic aneurysm Admission 07/14/2024 with pneumonia and respiratory failure  Disposition: Bradley Alvarez appears stable.  He has gastric cancer that appears localized to the stomach.  He is not a surgical candidate.  He is seen today prior to proceeding with ipilimumab /nivolumab .  He has attended an education class.  We again reviewed potential toxicities.  He agrees to proceed.  Plan to proceed with cycle 1 day 1 ipilimumab /nivolumab  today as scheduled.  CBC and chemistry panel reviewed.  Labs adequate for treatment.  He has iron  deficiency anemia.  There has been partial correction of the hemoglobin since beginning oral iron .  Plan to continue the same, hold on IV iron  for now.  He will return for follow-up in nivolumab  in 2 weeks.  We are available to see him sooner if needed.  Plan reviewed with Dr. Cloretta.    [name] ANP/GNP-BC   08/17/2024  10:00 AM

## 2024-08-18 ENCOUNTER — Encounter: Payer: Self-pay | Admitting: Pulmonary Disease

## 2024-08-18 ENCOUNTER — Telehealth: Payer: Self-pay

## 2024-08-18 ENCOUNTER — Ambulatory Visit: Admitting: Pulmonary Disease

## 2024-08-18 VITALS — BP 108/50 | HR 70 | Temp 98.2°F | Ht 74.0 in | Wt 300.0 lb

## 2024-08-18 DIAGNOSIS — J9620 Acute and chronic respiratory failure, unspecified whether with hypoxia or hypercapnia: Secondary | ICD-10-CM

## 2024-08-18 DIAGNOSIS — J9611 Chronic respiratory failure with hypoxia: Secondary | ICD-10-CM

## 2024-08-18 DIAGNOSIS — R918 Other nonspecific abnormal finding of lung field: Secondary | ICD-10-CM | POA: Diagnosis not present

## 2024-08-18 DIAGNOSIS — J45909 Unspecified asthma, uncomplicated: Secondary | ICD-10-CM | POA: Diagnosis not present

## 2024-08-18 DIAGNOSIS — Z87891 Personal history of nicotine dependence: Secondary | ICD-10-CM

## 2024-08-18 DIAGNOSIS — G4733 Obstructive sleep apnea (adult) (pediatric): Secondary | ICD-10-CM | POA: Diagnosis not present

## 2024-08-18 LAB — T4: T4, Total: 7.2 ug/dL (ref 4.5–12.0)

## 2024-08-18 NOTE — Patient Instructions (Signed)
 No changes to medication today  I am glad you are doing well  Please, if any worsening symptoms  If worsening cough congestion and shortness of breath we may need to increase or change the inhaler, continue Advair for now  Return to clinic in 3 months or sooner as needed with Dr. Annella

## 2024-08-18 NOTE — Telephone Encounter (Signed)
 24 hr call back:  Called and spoke with pt and wife to see how pt felt after first time infusion yesterday. Pt states that he feels fine, has no complaints, has not experienced any common side effects. Informed pt and wife to call cancer clinic if they have any questions, pt and wife verbalized understanding.

## 2024-08-18 NOTE — Progress Notes (Signed)
 @Patient  ID: Bradley Alvarez, male    DOB: 1941/01/24, 83 y.o.   MRN: [id number]  Chief Complaint  Patient presents with   Hospitalization Follow-up   Shortness of Breath    History of pneumonia, low oxygen  and diastolic heart failure. Patient is using his oxygen  tank more than usual. 07/14/2024.    Referring provider: [name], MD  HPI: 83 y.o., former smoker quit 1973 (15-pack-year history).  Past medical history significant for obstructive sleep apnea, hypertension, pulmonary embolism, coronary artery disease, 1 diastolic dysfunction, thoracic aortic aneurysm, type 2 diabetes, hyperlipidemia, morbid obesity.  Diagnosed gastric adenocarcinoma summer 2025 and send of anemia and GI blood loss.  We stopped Eliquis  at last visit with GI blood loss.  Subsequent workup found gastric adenocarcinoma.  He started immunotherapy recently.  He was hospitalized prior to first dose with worsening respiratory failure.  In my review interpretation of labs and images it is mostly related to heart failure.  There was early infiltrate concerning for pneumonia.  He was treated with IV antibiotics.  He got better.  He is on daily Lasix .  Improved.  Breathing stable.  Using oxygen  a bit more frequently now.  We discussed goal oxygen  saturation of 88%.  His oxygen  saturation was 96% today walking and on room air.  He did have his oxygen  tank with him.  Imaging:  Sniff test 10/2023 elevated right hemidiaphragm that does not move consistent with paralysis  CXR 07/15/21 showed no acute findings  CTA 07/13/21 no central obstructing PE. Acute wedge shaped consolidation abutting the pleural of the right lower lobe with surrounding ground glass opacity, suspicious for pulmonary infarction   No Known Allergies  Immunization History  Administered Date(s) Administered   Fluad Trivalent(High Dose 65+) 08/21/2023   Fluzone Influenza virus vaccine,trivalent (IIV3), split virus 09/02/2010, 08/21/2011, 08/23/2012    INFLUENZA, HIGH DOSE SEASONAL PF 09/18/2022   Influenza Split 08/19/2013, 08/21/2014   Influenza, Mdck, Trivalent,PF 6+ MOS(egg free) 08/07/2016, 08/07/2017   Influenza, Quadrivalent, Recombinant, Inj, Pf 08/24/2018, 07/22/2019, 09/25/2020, 09/09/2021, 09/19/2023   Influenza,inj,Quad PF,6+ Mos 08/18/2014, 08/09/2015   PFIZER(Purple Top)SARS-COV-2 Vaccination 12/24/2019, 01/13/2020, 09/02/2020, 05/13/2021, 06/19/2023   PNEUMOCOCCAL CONJUGATE-20 06/19/2022   Pneumococcal Conjugate-13 09/19/2013   Pneumococcal Polysaccharide-23 05/20/2022   Tdap 07/13/2012, 01/06/2022   Zoster, Live 03/07/2014    Past Medical History:  Diagnosis Date   AAA (abdominal aortic aneurysm) without rupture (HCC)    followed by dr brain--  per office note dated 11-05-2018  3.5cm   Benign essential HTN    cardiologist-- dr tally   Chronic combined systolic (congestive) and diastolic (congestive) heart failure [facility])    cardiologist-- dr katharyn   CKD (chronic kidney disease), stage III (HCC)    Diabetes mellitus type 2, insulin  dependent (HCC)    followed by dr onita   Diabetic neuropathy (HCC)    DOE (dyspnea on exertion)    Edema of both lower extremities    Full dentures    HOH (hard of hearing)    does wear hearing aids   Hydrocele, bilateral    Lichen planus    Mixed hyperlipidemia    Morbid obesity with BMI of 45.0-49.9, adult (HCC)    Nocturia    OA (osteoarthritis)    OSA on CPAP 10/14/08   per study  AHI  61.8/hr ,  RDI  96.1/hr.     Tobacco History: Social History   Tobacco Use  Smoking Status Former   Current packs/day: 0.00   Average packs/day: 1.5  packs/day for 10.0 years (15.0 ttl pk-yrs)   Types: Cigarettes   Start date: 12/13/1961   Quit date: 12/14/1971   Years since quitting: 52.7  Smokeless Tobacco Former   Types: Snuff, Chew   Counseling given: Not Answered   Outpatient Medications Prior to Visit  Medication Sig Dispense Refill   albuterol  (VENTOLIN  HFA) 108  (90 Base) MCG/ACT inhaler Inhale 2 puffs into the lungs every 6 (six) hours as needed for wheezing or shortness of breath. 1 each 6   BIDIL  20-37.5 MG tablet Take 2 tablets by mouth 3 (three) times daily. 540 tablet 3   carvedilol  (COREG ) 25 MG tablet TAKE TWO TABLETS BY MOUTH TWICE DAILY with meals (Patient taking differently: Take 25 mg by mouth 2 (two) times daily with a meal.) 360 tablet 3   clobetasol  ointment (TEMOVATE ) 0.05 % Apply 1 application  topically daily as needed (for itching- Lichen planus).     diclofenac sodium (VOLTAREN) 1 % GEL Apply 2 g topically 4 (four) times daily as needed (for pain).     felodipine  (PLENDIL ) 10 MG 24 hr tablet Take 10 mg by mouth in the morning.     ferrous sulfate  325 (65 FE) MG tablet Take 325 mg by mouth 2 (two) times daily with a meal.     fluticasone -salmeterol (ADVAIR) 250-50 MCG/ACT AEPB Inhale one puff by mouth into the lungs in the morning and at bedtime. (Patient taking differently: Inhale 1 puff into the lungs in the morning and at bedtime.) 60 each 10   furosemide  (LASIX ) 40 MG tablet Take 1 tablet (40 mg total) by mouth daily. (Patient taking differently: Take 20 mg by mouth daily. Dose changed) 30 tablet 1   HYDROcodone -acetaminophen  (NORCO/VICODIN) 5-325 MG tablet Take 1 tablet by mouth 2 (two) times daily as needed (for pain). 15 tablet 0   lidocaine  (LIDODERM ) 5 % Place 1 patch onto the skin daily. Remove & Discard patch within 12 hours or as directed by MD 20 patch 0   lidocaine -prilocaine  (EMLA ) cream Apply 1 Application topically as needed (May use after first treatment). 30 g 0   loratadine (CLARITIN) 10 MG tablet Take 10 mg by mouth every morning.      losartan  (COZAAR ) 100 MG tablet Take 100 mg by mouth daily.     MEGARED OMEGA-3 KRILL OIL PO Take 1 capsule by mouth daily.     methocarbamol  (ROBAXIN ) 500 MG tablet Take 500 mg by mouth at bedtime as needed for muscle spasms.     Multiple Vitamin (MULTIVITAMIN WITH MINERALS) TABS tablet  Take 1 tablet by mouth daily with breakfast.     omeprazole (PRILOSEC) 20 MG capsule Take 20 mg by mouth daily.     ondansetron  (ZOFRAN ) 8 MG tablet Take 1 tablet (8 mg total) by mouth every 8 (eight) hours as needed for nausea or vomiting. 20 tablet 2   ONETOUCH VERIO test strip USE TO TEST BID AS DIRECTED  2   polyethylene glycol (MIRALAX  / GLYCOLAX ) 17 g packet Take 17 g by mouth 2 (two) times daily.     potassium chloride  SA (KLOR-CON  M20) 20 MEQ tablet Take 1 tablet (20 mEq total) by mouth daily. 30 tablet 1   senna (SENOKOT) 8.6 MG TABS tablet Take 2 tablets (17.2 mg total) by mouth at bedtime.     simvastatin  (ZOCOR ) 40 MG tablet Take 40 mg by mouth every evening.     spironolactone  (ALDACTONE ) 50 MG tablet Take 0.5 tablets (25 mg total) by  mouth daily.     SYSTANE 0.4-0.3 % SOLN Place 1 drop into both eyes 3 (three) times daily as needed (for dryness).     TYLENOL  325 MG tablet Take 325-650 mg by mouth every 6 (six) hours as needed for mild pain (pain score 1-3) or headache.     urea (CARMOL) 10 % cream Apply 1 application  topically as needed (for dryness).     vitamin B-12 (CYANOCOBALAMIN ) 500 MCG tablet Take 500 mcg by mouth daily.     No facility-administered medications prior to visit.      Review of Systems  Review of Systems N/a  Physical Exam  BP (!) 108/50 (BP Location: Left Arm, Patient Position: Sitting, Cuff Size: Large)   Pulse 70   Temp 98.2 F (36.8 C) (Oral)   Ht 6' 2 (1.88 m)   Wt 300 lb (136.1 kg)   SpO2 96%   BMI 38.52 kg/m  Physical Exam  Neuro: Well-appearing, no acute distress Eyes: EOMI, no icterus Neck: Supple, no JVP appreciated Pulmonary: Clear, normal work of breathing Cardiovascular: Regular rate and rhythm, no murmurs   Lab Results: Personally reviewed CBC    Component Value Date/Time   WBC 5.6 08/17/2024 0935   WBC 7.4 07/26/2024 0856   RBC 4.08 (L) 08/17/2024 0935   HGB 9.4 (L) 08/17/2024 0935   HGB 14.1 09/10/2017 1056    HCT 31.5 (L) 08/17/2024 0935   HCT 42.3 09/10/2017 1056   PLT 187 08/17/2024 0935   PLT 141 (L) 09/10/2017 1056   MCV 77.2 (L) 08/17/2024 0935   MCV 81 09/10/2017 1056   MCH 23.0 (L) 08/17/2024 0935   MCHC 29.8 (L) 08/17/2024 0935   RDW 20.9 (H) 08/17/2024 0935   RDW 15.1 09/10/2017 1056   LYMPHSABS 1.1 08/17/2024 0935   MONOABS 1.1 (H) 08/17/2024 0935   EOSABS 0.2 08/17/2024 0935   BASOSABS 0.0 08/17/2024 0935    BMET    Component Value Date/Time   NA 137 08/17/2024 0935   NA 137 08/10/2024 1227   K 4.3 08/17/2024 0935   CL 101 08/17/2024 0935   CO2 26 08/17/2024 0935   GLUCOSE 116 (H) 08/17/2024 0935   BUN 8 08/17/2024 0935   BUN 9 08/10/2024 1227   CREATININE 1.24 08/17/2024 0935   CREATININE 1.14 03/03/2017 1028   CALCIUM 9.0 08/17/2024 0935   GFRNONAA 58 (L) 08/17/2024 0935   GFRAA 57 (L) 11/09/2020 0806    BNP    Component Value Date/Time   BNP 15.9 07/13/2021 1339    ProBNP    Component Value Date/Time   PROBNP 237.0 07/14/2024 1554    Imaging: Personally reviewed and interpreted as CTA 07/13/2021 with no PE seen right lower lobe wedge-shaped infiltrate concerning for infarct Nuclear medicine perfusion scan 07/15/2021 without evidence of pulmonary embolus  Assessment & Plan:   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   Presumed related to blood clot.  Discussed risk and benefits of ongoing anticoagulation.  Previously recommended lifelong anticoagulation.  However now with concern for bleeding, new anemia, new GI bleed we must reevaluate the risks and benefits.  While the severity of presumed clot was relatively severe and lifelong anticoagulation was recommended in the past, given there is a lack of definitive diagnosis as well as now new bleeding complication, risk of anticoagulation seems too high.  After shared decision making and discussing risk of benefits of continuing and  stopping anticoagulation, we agreed to stop anticoagulation moving forward.  Eliquis  removed from medication list 06/24/2024.  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  Goal oxygen  saturation 88%.  Titrate oxygen  therapy at rest and with exertion to maintain this.  Explained in detail today.  OSA on CPAP: continue CPAP, oxygen  bleed in.  He reports good adherence.  Compliance report reviewed, AHI 0.2.  Using every night for multiple hours.  He continues to benefit clinically from ongoing CPAP usage.  Asthma: Wheeze in the past improved with prednisone .  Albuterol  helps.  Advair mid dose Diskus with some improvement.  Escalated to Trelegy in the past and no improvement.  Back on Advair.  To continue.  Preoperative evaluation: Pulmonary medicine does not provide preoperative plans related preoperative risk assessment.  Based on the ARISCAT model patient is intermediate to 13.3% risk of postoperative pulmonary complication.  Risk is elevated in the setting of new anemia.  Other risk factors include age and mild decrease in oxygen  saturation.  There is no reversibility to this.  No further recommendations.  Stopping anticoagulation as above.  Return to clinic in 3 months or sooner as needed with Dr. Annella Donnice JONELLE Annella, MD 08/18/2024

## 2024-08-19 ENCOUNTER — Other Ambulatory Visit: Payer: Self-pay

## 2024-08-19 ENCOUNTER — Inpatient Hospital Stay

## 2024-08-19 ENCOUNTER — Ambulatory Visit

## 2024-08-19 NOTE — Progress Notes (Signed)
 [facility] CSW Progress Note  Clinical Child psychotherapist contacted caregiver by phone to follow-up on need for community resources. CSW and patient's spouse walked through entire [facility] application to assist with completion. Visit scheduled to review and submit.    Lizbeth Sprague, LCSW Clinical Social Worker [facility]

## 2024-08-22 ENCOUNTER — Telehealth: Payer: Self-pay

## 2024-08-22 ENCOUNTER — Inpatient Hospital Stay

## 2024-08-22 ENCOUNTER — Ambulatory Visit

## 2024-08-22 NOTE — Telephone Encounter (Signed)
 [facility] CSW Progress Note  Patient's spouse called to reschedule appointment for 9/23 at 1:00 PM.    Lizbeth Sprague, LCSW Clinical Social Worker [facility]

## 2024-08-23 ENCOUNTER — Inpatient Hospital Stay

## 2024-08-28 ENCOUNTER — Other Ambulatory Visit: Payer: Self-pay | Admitting: Oncology

## 2024-08-29 ENCOUNTER — Ambulatory Visit: Admitting: Podiatry

## 2024-08-29 ENCOUNTER — Encounter: Payer: Self-pay | Admitting: Podiatry

## 2024-08-29 DIAGNOSIS — B351 Tinea unguium: Secondary | ICD-10-CM | POA: Diagnosis not present

## 2024-08-29 DIAGNOSIS — E119 Type 2 diabetes mellitus without complications: Secondary | ICD-10-CM

## 2024-08-29 DIAGNOSIS — M79674 Pain in right toe(s): Secondary | ICD-10-CM

## 2024-08-29 DIAGNOSIS — M79675 Pain in left toe(s): Secondary | ICD-10-CM

## 2024-08-29 NOTE — Progress Notes (Signed)
This patient returns to my office for at risk foot care.  This patient requires this care by a professional since this patient will be at risk due to having  Diabetes.  This patient is unable to cut nails himself since the patient cannot reach his nails.These nails are painful walking and wearing shoes. Patient requests diabetic shoes. This patient presents for at risk foot care today.  General Appearance  Alert, conversant and in no acute stress.  Vascular  Dorsalis pedis  are  weakly  palpable  bilaterally.  Posterior tibial pulses are absent due to ankle swelling.  Absent pedal hair.Capillary return is within normal limits  bilaterally. Cold feet bilaterally.  Neurologic  Senn-Weinstein monofilament wire test diminished  bilaterally. Muscle power within normal limits bilaterally.  Nails Thick disfigured discolored nails with subungual debris  from hallux to fifth toes bilaterally. No evidence of bacterial infection or drainage bilaterally.  Orthopedic  No limitations of motion  feet .  No crepitus or effusions noted.  No bony pathology or digital deformities noted. HAV  B/L.  PTT tendinitis/arthritis left foot.  Pes planus  Skin  normotropic skin with no porokeratosis noted bilaterally.  No signs of infections or ulcers noted.     Onychomycosis  Pain in right toes  Pain in left toes  Consent was obtained for treatment procedures.   Mechanical debridement of nails 1-5  bilaterally performed with a nail nipper.  Filed with dremel without incident.    Return office visit   3 months                   Told patient to return for periodic foot care and evaluation due to potential at risk complications.   Helane Gunther DPM

## 2024-08-29 NOTE — Addendum Note (Signed)
 Encounter addended by: Janice Lynwood BROCKS on: 08/29/2024 2:12 PM  Actions taken: Imaging Exam ended

## 2024-08-30 ENCOUNTER — Telehealth: Payer: Self-pay

## 2024-08-30 ENCOUNTER — Other Ambulatory Visit: Payer: Self-pay

## 2024-08-30 DIAGNOSIS — H353221 Exudative age-related macular degeneration, left eye, with active choroidal neovascularization: Secondary | ICD-10-CM | POA: Diagnosis not present

## 2024-08-30 NOTE — Telephone Encounter (Signed)
 [facility] CSW Progress Note  Clinical Social Worker attempted to reach patient to confirm documentation was recieived. SNAP application submitted on this date via online portal. CSW left direct contact to return call.     Interventions: Referred patient to community resources: SNAP       Follow Up Plan:  CSW remains available to help patient as he continues treatment.     Lizbeth Sprague, LCSW Clinical Social Worker [facility]

## 2024-08-30 NOTE — Progress Notes (Unsigned)
 [facility]   Telephone:([phone]) [phone] Fax:(336) [phone]    Patient Care Team: [name], MD as PCP - General (Internal Medicine) [name], MD as PCP - Cardiology (Cardiology) [name], RN as Oncology Nurse Navigator   CHIEF COMPLAINT: Follow up gastric cancer   CURRENT THERAPY: Ipilimumab /Nivolumab   INTERVAL HISTORY Bradley Alvarez returns for follow up as scheduled. Last seen 08/17/24 with cycle 1 day 1 ipi/nivo.  He tolerated treatment well.  He gets cold at times without shaking chills, no fever.  Eating and drinking with adequate energy level and activity.  Manages constipation with MiraLAX .  Denies abdominal pain, nausea/vomiting, or any other new or specific complaints.  ROS  All other systems reviewed and negative  Past Medical History:  Diagnosis Date   AAA (abdominal aortic aneurysm) without rupture    followed by dr brain--  per office note dated 11-05-2018  3.5cm   Benign essential HTN    cardiologist-- dr tally   Chronic combined systolic (congestive) and diastolic (congestive) heart failure [facility])    cardiologist-- dr katharyn   CKD (chronic kidney disease), stage III (HCC)    Diabetes mellitus type 2, insulin  dependent (HCC)    followed by dr onita   Diabetic neuropathy (HCC)    DOE (dyspnea on exertion)    Edema of both lower extremities    Full dentures    HOH (hard of hearing)    does wear hearing aids   Hydrocele, bilateral    Lichen planus    Mixed hyperlipidemia    Morbid obesity with BMI of 45.0-49.9, adult (HCC)    Nocturia    OA (osteoarthritis)    OSA on CPAP 10/14/08   per study  AHI  61.8/hr ,  RDI  96.1/hr.      Past Surgical History:  Procedure Laterality Date   CARDIAC CATHETERIZATION  per pt yrs ago   was told no blockage   CARDIOVASCULAR STRESS TEST  07-19-2014   dr debby sor   Low risk nuclear study w/ small apical attentuation artifact/  normal LV function and wall motion , ef 53%    CATARACT EXTRACTION W/ INTRAOCULAR LENS  IMPLANT, BILATERAL  2018   COLONOSCOPY W/ BIOPSIES AND POLYPECTOMY     COLONOSCOPY WITH PROPOFOL  N/A 05/16/2016   Procedure: COLONOSCOPY WITH PROPOFOL ;  Surgeon: [name], MD;  Location: [facility];  Service: Endoscopy;  Laterality: N/A;   ESOPHAGOGASTRODUODENOSCOPY N/A 07/01/2024   Procedure: EGD (ESOPHAGOGASTRODUODENOSCOPY);  Surgeon: Just Belvie, MD;  Location: [facility];  Service: Gastroenterology;  Laterality: N/A;   HYDROCELE EXCISION Bilateral 11/15/2018   Procedure: HYDROCELECTOMY ADULT;  Surgeon: [name], Mark, MD;  Location: [facility];  Service: Urology;  Laterality: Bilateral;   IR IMAGING GUIDED PORT INSERTION  08/12/2024   KNEE ARTHROSCOPY Right 2009   MULTIPLE TOOTH EXTRACTIONS     ORIF ANKLE FRACTURE Left 09/13/2014   Procedure: Open Reduction Internal Fixation Left Ankle Medial Malleolus;  Surgeon: [name], MD;  Location: [facility];  Service: Orthopedics;  Laterality: Left;   PROSTATECTOMY  1993 approx.  by dr matilda   for prostate enlargement   TOTAL KNEE ARTHROPLASTY Right 08/27/2009   dr jane @[facility]    TOTAL KNEE ARTHROPLASTY  12/20/2012   Procedure: TOTAL KNEE ARTHROPLASTY;  Surgeon: [name], MD;  Location: [facility] OR;  Service: Orthopedics;  Laterality: Left;  left total knee arthroplasty   TRANSTHORACIC ECHOCARDIOGRAM  11-04-2018   dr elmira  moderate to severe concentric LVH, ef 56%,  grade 1 diastolic dysfunciton/  mild AV calcification annulus and leaflets without stentosis ,  mild regurg./  moderate LAE/ dilated aortic root, 4.5cm/  mild MV annulus calcification with trace regurg./  trace TR     Outpatient Encounter Medications as of 08/31/2024  Medication Sig Note   albuterol  (VENTOLIN  HFA) 108 (90 Base) MCG/ACT inhaler Inhale 2 puffs into the lungs every 6 (six) hours as needed for wheezing or shortness of breath.    BIDIL  20-37.5 MG tablet Take 2 tablets by mouth 3 (three) times daily.     clobetasol  ointment (TEMOVATE ) 0.05 % Apply 1 application  topically daily as needed (for itching- Lichen planus). 09/08/2014: .   diclofenac sodium (VOLTAREN) 1 % GEL Apply 2 g topically 4 (four) times daily as needed (for pain).    felodipine  (PLENDIL ) 10 MG 24 hr tablet Take 10 mg by mouth in the morning.    ferrous sulfate  325 (65 FE) MG tablet Take 325 mg by mouth 2 (two) times daily with a meal.    fluticasone -salmeterol (ADVAIR) 250-50 MCG/ACT AEPB Inhale one puff by mouth into the lungs in the morning and at bedtime. (Patient taking differently: Inhale 1 puff into the lungs in the morning and at bedtime.)    furosemide  (LASIX ) 40 MG tablet Take 1 tablet (40 mg total) by mouth daily. (Patient taking differently: Take 20 mg by mouth daily. Dose changed)    HYDROcodone -acetaminophen  (NORCO/VICODIN) 5-325 MG tablet Take 1 tablet by mouth 2 (two) times daily as needed (for pain).    lidocaine  (LIDODERM ) 5 % Place 1 patch onto the skin daily. Remove & Discard patch within 12 hours or as directed by MD (Patient taking differently: Place 1 patch onto the skin daily. Remove & Discard patch within 12 hours or as directed by MD 4% over the counter patch)    lidocaine -prilocaine  (EMLA ) cream Apply 1 Application topically as needed (May use after first treatment).    loratadine (CLARITIN) 10 MG tablet Take 10 mg by mouth every morning.     losartan  (COZAAR ) 100 MG tablet Take 100 mg by mouth daily.    MEGARED OMEGA-3 KRILL OIL PO Take 1 capsule by mouth daily.    methocarbamol  (ROBAXIN ) 500 MG tablet Take 500 mg by mouth at bedtime as needed for muscle spasms.    Multiple Vitamin (MULTIVITAMIN WITH MINERALS) TABS tablet Take 1 tablet by mouth daily with breakfast.    omeprazole (PRILOSEC) 20 MG capsule Take 20 mg by mouth daily.    ondansetron  (ZOFRAN ) 8 MG tablet Take 1 tablet (8 mg total) by mouth every 8 (eight) hours as needed for nausea or vomiting.    ONETOUCH VERIO test strip USE TO TEST BID AS  DIRECTED    polyethylene glycol (MIRALAX  / GLYCOLAX ) 17 g packet Take 17 g by mouth 2 (two) times daily.    potassium chloride  SA (KLOR-CON  M20) 20 MEQ tablet Take 1 tablet (20 mEq total) by mouth daily.    senna (SENOKOT) 8.6 MG TABS tablet Take 2 tablets (17.2 mg total) by mouth at bedtime.    simvastatin  (ZOCOR ) 40 MG tablet Take 40 mg by mouth every evening.    spironolactone  (ALDACTONE ) 50 MG tablet Take 0.5 tablets (25 mg total) by mouth daily.    SYSTANE 0.4-0.3 % SOLN Place 1 drop into both eyes 3 (three) times daily as needed (for dryness).    TYLENOL  325 MG tablet Take 325-650 mg by mouth  every 6 (six) hours as needed for mild pain (pain score 1-3) or headache.    urea (CARMOL) 10 % cream Apply 1 application  topically as needed (for dryness).    vitamin B-12 (CYANOCOBALAMIN ) 500 MCG tablet Take 500 mcg by mouth daily.    carvedilol  (COREG ) 25 MG tablet TAKE TWO TABLETS BY MOUTH TWICE DAILY with meals (Patient not taking: Reported on 08/31/2024)    No facility-administered encounter medications on file as of 08/31/2024.     Today's Vitals   08/31/24 1032 08/31/24 1100  BP: 118/63   Pulse: 65   Resp: 20   Temp: 98.1 F (36.7 C)   TempSrc: Temporal   SpO2: 93%   Weight: (!) 301 lb 1.6 oz (136.6 kg)   Height: 6' 2 (1.88 m)   PainSc:  0-No pain   Body mass index is 38.66 kg/m.   ECOG PERFORMANCE STATUS: 1 - Symptomatic but completely ambulatory  PHYSICAL EXAM GENERAL:alert, no distress and comfortable SKIN: no rash  EYES: sclera clear LUNGS: clear with normal breathing effort HEART: regular rate & rhythm ABDOMEN: abdomen soft, non-tender and normal bowel sounds NEURO: alert & oriented x 3 with fluent speech, no focal motor/sensory deficits PAC without erythema     CBC    Latest Ref Rng & Units 08/31/2024    9:50 AM 08/17/2024    9:35 AM 07/26/2024    8:56 AM  CBC  WBC 4.0 - 10.5 K/uL 6.0  5.6  7.4   Hemoglobin 13.0 - 17.0 g/dL 89.8  9.4  7.8   Hematocrit 39.0 -  52.0 % 34.8  31.5  27.3   Platelets 150 - 400 K/uL 189  187  231       CMP     Latest Ref Rng & Units 08/31/2024    9:50 AM 08/17/2024    9:35 AM 08/10/2024   12:27 PM  CMP  Glucose 70 - 99 mg/dL 856  883  87   BUN 8 - 23 mg/dL 11  8  9    Creatinine 0.61 - 1.24 mg/dL 8.70  8.75  8.51   Sodium 135 - 145 mmol/L 134  137  137   Potassium 3.5 - 5.1 mmol/L 4.7  4.3  4.3   Chloride 98 - 111 mmol/L 101  101  100   CO2 22 - 32 mmol/L 24  26  23    Calcium 8.9 - 10.3 mg/dL 8.9  9.0  8.5   Total Protein 6.5 - 8.1 g/dL 6.7  6.8    Total Bilirubin 0.0 - 1.2 mg/dL 0.4  0.4    Alkaline Phos 38 - 126 U/L 51  49    AST 15 - 41 U/L 25  20    ALT 0 - 44 U/L 15  7        ASSESSMENT & PLAN: Gastric cancer Upper endoscopy 07/01/2024-large fungating, ulcerated, noncircumferential mass on the lesser curvature of the stomach.  Pathology-invasive moderately differentiated adenocarcinoma; HER2 negative, loss of MLH1 and PMS2, Claudin 18-negative, PD-L1 CPS-50, MLH1 methylation detected, BRAF-negative; foundation 1-MSI high, tumor mutation burden 41 CTs 07/06/2024-enhancing lesser curvature wall thickening/mass; mesenteric root fat stranding and multiple retroperitoneal/periaortic and celiac lymph nodes; new right upper lobe upper lobe pulmonary nodule; multiple prominent mediastinal lymph nodes 07/29/2024 PET scan-hypermetabolic gastric mass.  No evidence of metastatic disease. Cycle 1 ipilimumab /nivolumab  08/17/2024 Cycle 1 day 15 nivolumab  08/31/2024  Iron  deficiency anemia  Oral iron  initiated Hemoglobin improved 08/17/2024 CAD/CHF Diabetes Hypertension CKD Obstructive sleep  apnea History of pulmonary embolus August 2022-previously on apixaban .  Apixaban  stopped due to bleeding. Ascending aortic aneurysm Admission 07/14/2024 with pneumonia and respiratory failure   Disposition:  Mr. Lumadue appears stable, s/p cycle 1 day 1 Ipi/Nivo, tolerated well without significant toxicities.  Able to recover and  function well with good performance status.  There is no clinical evidence of disease progression.   Labs reviewed, adequate to proceed with cycle 1 day 15 nivolumab  today, no dose modifications.  I answered questions about infection risk and precautions.  Return for lab, office visit, and cycle 1 day 29 Nivo in 2 weeks, or sooner if needed.     All questions were answered. The patient knows to call the clinic with any problems, questions or concerns. No barriers to learning were detected.   Emberlie Gotcher K Garcia Dalzell, NP 08/31/2024

## 2024-08-31 ENCOUNTER — Inpatient Hospital Stay: Admitting: Nurse Practitioner

## 2024-08-31 ENCOUNTER — Inpatient Hospital Stay: Attending: Oncology

## 2024-08-31 ENCOUNTER — Other Ambulatory Visit

## 2024-08-31 ENCOUNTER — Inpatient Hospital Stay

## 2024-08-31 ENCOUNTER — Telehealth: Payer: Self-pay

## 2024-08-31 ENCOUNTER — Encounter: Payer: Self-pay | Admitting: Nurse Practitioner

## 2024-08-31 VITALS — BP 118/63 | HR 65 | Temp 98.1°F | Resp 20 | Ht 74.0 in | Wt 301.1 lb

## 2024-08-31 VITALS — BP 119/77 | HR 52 | Temp 97.6°F | Resp 16

## 2024-08-31 DIAGNOSIS — Z5112 Encounter for antineoplastic immunotherapy: Secondary | ICD-10-CM | POA: Insufficient documentation

## 2024-08-31 DIAGNOSIS — Z79899 Other long term (current) drug therapy: Secondary | ICD-10-CM | POA: Diagnosis not present

## 2024-08-31 DIAGNOSIS — D509 Iron deficiency anemia, unspecified: Secondary | ICD-10-CM | POA: Diagnosis not present

## 2024-08-31 DIAGNOSIS — I5042 Chronic combined systolic (congestive) and diastolic (congestive) heart failure: Secondary | ICD-10-CM | POA: Insufficient documentation

## 2024-08-31 DIAGNOSIS — M1990 Unspecified osteoarthritis, unspecified site: Secondary | ICD-10-CM | POA: Insufficient documentation

## 2024-08-31 DIAGNOSIS — Z23 Encounter for immunization: Secondary | ICD-10-CM | POA: Diagnosis not present

## 2024-08-31 DIAGNOSIS — Z86711 Personal history of pulmonary embolism: Secondary | ICD-10-CM | POA: Diagnosis not present

## 2024-08-31 DIAGNOSIS — C169 Malignant neoplasm of stomach, unspecified: Secondary | ICD-10-CM

## 2024-08-31 DIAGNOSIS — R911 Solitary pulmonary nodule: Secondary | ICD-10-CM | POA: Insufficient documentation

## 2024-08-31 DIAGNOSIS — Z7951 Long term (current) use of inhaled steroids: Secondary | ICD-10-CM | POA: Diagnosis not present

## 2024-08-31 DIAGNOSIS — I7121 Aneurysm of the ascending aorta, without rupture: Secondary | ICD-10-CM | POA: Insufficient documentation

## 2024-08-31 DIAGNOSIS — I130 Hypertensive heart and chronic kidney disease with heart failure and stage 1 through stage 4 chronic kidney disease, or unspecified chronic kidney disease: Secondary | ICD-10-CM | POA: Diagnosis not present

## 2024-08-31 DIAGNOSIS — G4733 Obstructive sleep apnea (adult) (pediatric): Secondary | ICD-10-CM | POA: Insufficient documentation

## 2024-08-31 DIAGNOSIS — Z6841 Body Mass Index (BMI) 40.0 and over, adult: Secondary | ICD-10-CM | POA: Diagnosis not present

## 2024-08-31 DIAGNOSIS — E1122 Type 2 diabetes mellitus with diabetic chronic kidney disease: Secondary | ICD-10-CM | POA: Diagnosis not present

## 2024-08-31 DIAGNOSIS — I714 Abdominal aortic aneurysm, without rupture, unspecified: Secondary | ICD-10-CM | POA: Insufficient documentation

## 2024-08-31 DIAGNOSIS — Z794 Long term (current) use of insulin: Secondary | ICD-10-CM | POA: Diagnosis not present

## 2024-08-31 DIAGNOSIS — I10 Essential (primary) hypertension: Secondary | ICD-10-CM | POA: Insufficient documentation

## 2024-08-31 DIAGNOSIS — E782 Mixed hyperlipidemia: Secondary | ICD-10-CM | POA: Diagnosis not present

## 2024-08-31 DIAGNOSIS — K5900 Constipation, unspecified: Secondary | ICD-10-CM | POA: Diagnosis not present

## 2024-08-31 LAB — CBC WITH DIFFERENTIAL (CANCER CENTER ONLY)
Abs Immature Granulocytes: 0.01 K/uL (ref 0.00–0.07)
Basophils Absolute: 0 K/uL (ref 0.0–0.1)
Basophils Relative: 0 %
Eosinophils Absolute: 0.2 K/uL (ref 0.0–0.5)
Eosinophils Relative: 3 %
HCT: 34.8 % — ABNORMAL LOW (ref 39.0–52.0)
Hemoglobin: 10.1 g/dL — ABNORMAL LOW (ref 13.0–17.0)
Immature Granulocytes: 0 %
Lymphocytes Relative: 24 %
Lymphs Abs: 1.4 K/uL (ref 0.7–4.0)
MCH: 23.1 pg — ABNORMAL LOW (ref 26.0–34.0)
MCHC: 29 g/dL — ABNORMAL LOW (ref 30.0–36.0)
MCV: 79.5 fL — ABNORMAL LOW (ref 80.0–100.0)
Monocytes Absolute: 1 K/uL (ref 0.1–1.0)
Monocytes Relative: 17 %
Neutro Abs: 3.4 K/uL (ref 1.7–7.7)
Neutrophils Relative %: 56 %
Platelet Count: 189 K/uL (ref 150–400)
RBC: 4.38 MIL/uL (ref 4.22–5.81)
RDW: 19.4 % — ABNORMAL HIGH (ref 11.5–15.5)
WBC Count: 6 K/uL (ref 4.0–10.5)
nRBC: 0 % (ref 0.0–0.2)

## 2024-08-31 LAB — CMP (CANCER CENTER ONLY)
ALT: 15 U/L (ref 0–44)
AST: 25 U/L (ref 15–41)
Albumin: 3.5 g/dL (ref 3.5–5.0)
Alkaline Phosphatase: 51 U/L (ref 38–126)
Anion gap: 9 (ref 5–15)
BUN: 11 mg/dL (ref 8–23)
CO2: 24 mmol/L (ref 22–32)
Calcium: 8.9 mg/dL (ref 8.9–10.3)
Chloride: 101 mmol/L (ref 98–111)
Creatinine: 1.29 mg/dL — ABNORMAL HIGH (ref 0.61–1.24)
GFR, Estimated: 55 mL/min — ABNORMAL LOW (ref >60)
Glucose, Bld: 143 mg/dL — ABNORMAL HIGH (ref 70–99)
Potassium: 4.7 mmol/L (ref 3.5–5.1)
Sodium: 134 mmol/L — ABNORMAL LOW (ref 135–145)
Total Bilirubin: 0.4 mg/dL (ref 0.0–1.2)
Total Protein: 6.7 g/dL (ref 6.5–8.1)

## 2024-08-31 LAB — FERRITIN: Ferritin: 42 ng/mL (ref 24–336)

## 2024-08-31 MED ORDER — SODIUM CHLORIDE 0.9 % IV SOLN
240.0000 mg | Freq: Once | INTRAVENOUS | Status: AC
  Administered 2024-08-31: 240 mg via INTRAVENOUS
  Filled 2024-08-31: qty 24

## 2024-08-31 MED ORDER — SODIUM CHLORIDE 0.9 % IV SOLN: INTRAVENOUS | Status: DC

## 2024-08-31 NOTE — Patient Instructions (Signed)
 [facility] - A DEPT OF [facility]  Discharge Instructions: Thank you for choosing [facility] to provide your oncology and hematology care.   If you have a lab appointment with the Cancer Center, please go directly to the Cancer Center and check in at the registration area.   Wear comfortable clothing and clothing appropriate for easy access to any Portacath or PICC line.   We strive to give you quality time with your provider. You may need to reschedule your appointment if you arrive late (15 or more minutes).  Arriving late affects you and other patients whose appointments are after yours.  Also, if you miss three or more appointments without notifying the office, you may be dismissed from the clinic at the provider's discretion.      For prescription refill requests, have your pharmacy contact our office and allow 72 hours for refills to be completed.    Today you received the following chemotherapy and/or immunotherapy agents: nivolumab  (Opdivo ) Ipilimumab  Injection What is this medication? IPILIMUMAB  (IP i LIM ue mab) treats some types of cancer. It works by helping your immune system slow or stop the spread of cancer cells. It is a monoclonal antibody. This medicine may be used for other purposes; ask your health care provider or pharmacist if you have questions. COMMON BRAND NAME(S): YERVOY  What should I tell my care team before I take this medication? They need to know if you have any of these conditions: Allogeneic stem cell transplant (uses someone else's stem cells) Autoimmune diseases, such as Crohn disease, ulcerative colitis, lupus Nervous system problems, such as Guillain-Barre syndrome or myasthenia gravis Organ transplant An unusual or allergic reaction to ipilimumab , other medications, foods, dyes, or preservatives Pregnant or trying to get pregnant Breast-feeding How should I use this medication? This medication is infused  into a vein. It is given by your care team in a hospital or clinic setting. A special MedGuide will be given to you before each treatment. Be sure to read this information carefully each time. Talk to your care team about the use of this medication in children. While it may be prescribed for children as young as 12 years for selected conditions, precautions do apply. Overdosage: If you think you have taken too much of this medicine contact a poison control center or emergency room at once. NOTE: This medicine is only for you. Do not share this medicine with others. What if I miss a dose? Keep appointments for follow-up doses. It is important not to miss your dose. Call your care team if you are unable to keep an appointment. What may interact with this medication? Interactions are not expected. This list may not describe all possible interactions. Give your health care provider a list of all the medicines, herbs, non-prescription drugs, or dietary supplements you use. Also tell them if you smoke, drink alcohol , or use illegal drugs. Some items may interact with your medicine. What should I watch for while using this medication? Your condition will be monitored carefully while you are receiving this medication. You may need blood work while taking this medication. This medication may cause serious skin reactions. They can happen weeks to months after starting the medication. Contact your care team right away if you notice fevers or flu-like symptoms with a rash. The rash may be red or purple and then turn into blisters or peeling of the skin. You may also notice a red rash with swelling of  the face, lips, or lymph nodes in your neck or under your arms. Tell your care team right away if you have any change in your eyesight. Talk to your care team if you may be pregnant. Serious birth defects can occur if you take this medication during pregnancy and for 3 months after the last dose. You will need a  negative pregnancy test before starting this medication. Contraception is recommended while taking this medication and for 3 months after the last dose. Your care team can help you find the option that works for you. Do not breastfeed while taking this medication and for 3 months after the last dose. What side effects may I notice from receiving this medication? Side effects that you should report to your care team as soon as possible: Allergic reactions--skin rash, itching, hives, swelling of the face, lips, tongue, or throat Dry cough, shortness of breath or trouble breathing Eye pain, redness, irritation, or discharge with blurry or decreased vision Heart muscle inflammation--unusual weakness or fatigue, shortness of breath, chest pain, fast or irregular heartbeat, dizziness, swelling of the ankles, feet, or hands Hormone gland problems--headache, sensitivity to light, unusual weakness or fatigue, dizziness, fast or irregular heartbeat, increased sensitivity to cold or heat, excessive sweating, constipation, hair loss, increased thirst or amount of urine, tremors or shaking, irritability Infusion reactions--chest pain, shortness of breath or trouble breathing, feeling faint or lightheaded Kidney injury (glomerulonephritis)--decrease in the amount of urine, red or dark brown urine, foamy or bubbly urine, swelling of the ankles, hands, or feet Liver injury--right upper belly pain, loss of appetite, nausea, light-colored stool, dark yellow or brown urine, yellowing skin or eyes, unusual weakness or fatigue Pain, tingling, or numbness in the hands or feet, muscle weakness, change in vision, confusion or trouble speaking, loss of balance or coordination, trouble walking, seizures Rash, fever, and swollen lymph nodes Redness, blistering, peeling, or loosening of the skin, including inside the mouth Sudden or severe stomach pain, bloody diarrhea, fever, nausea, vomiting Side effects that usually do not  require medical attention (report to your care team if they continue or are bothersome): Bone, joint, or muscle pain Diarrhea Fatigue Loss of appetite Nausea Skin rash This list may not describe all possible side effects. Call your doctor for medical advice about side effects. You may report side effects to FDA at 1-800-FDA-1088. Where should I keep my medication? This medication is given in a hospital or clinic. It will not be stored at home. NOTE: This sheet is a summary. It may not cover all possible information. If you have questions about this medicine, talk to your doctor, pharmacist, or health care provider.  2024 Elsevier/Gold Standard (2022-04-04 00:00:00)  Nivolumab  Injection What is this medication? NIVOLUMAB  (nye VOL ue mab) treats some types of cancer. It works by helping your immune system slow or stop the spread of cancer cells. It is a monoclonal antibody. This medicine may be used for other purposes; ask your health care provider or pharmacist if you have questions. COMMON BRAND NAME(S): Opdivo  What should I tell my care team before I take this medication? They need to know if you have any of these conditions: Allogeneic stem cell transplant (uses someone else's stem cells) Autoimmune diseases, such as Crohn disease, ulcerative colitis, lupus History of chest radiation Nervous system problems, such as Guillain-Barre syndrome or myasthenia gravis Organ transplant An unusual or allergic reaction to nivolumab , other medications, foods, dyes, or preservatives Pregnant or trying to get pregnant Breast-feeding How should I  use this medication? This medication is infused into a vein. It is given in a hospital or clinic setting. A special MedGuide will be given to you before each treatment. Be sure to read this information carefully each time. Talk to your care team about the use of this medication in children. While it may be prescribed for children as young as 12 years for  selected conditions, precautions do apply. Overdosage: If you think you have taken too much of this medicine contact a poison control center or emergency room at once. NOTE: This medicine is only for you. Do not share this medicine with others. What if I miss a dose? Keep appointments for follow-up doses. It is important not to miss your dose. Call your care team if you are unable to keep an appointment. What may interact with this medication? Interactions have not been studied. This list may not describe all possible interactions. Give your health care provider a list of all the medicines, herbs, non-prescription drugs, or dietary supplements you use. Also tell them if you smoke, drink alcohol , or use illegal drugs. Some items may interact with your medicine. What should I watch for while using this medication? Your condition will be monitored carefully while you are receiving this medication. You may need blood work while taking this medication. This medication may cause serious skin reactions. They can happen weeks to months after starting the medication. Contact your care team right away if you notice fevers or flu-like symptoms with a rash. The rash may be red or purple and then turn into blisters or peeling of the skin. You may also notice a red rash with swelling of the face, lips, or lymph nodes in your neck or under your arms. Tell your care team right away if you have any change in your eyesight. Talk to your care team if you are pregnant or think you might be pregnant. A negative pregnancy test is required before starting this medication. A reliable form of contraception is recommended while taking this medication and for 5 months after the last dose. Talk to your care team about effective forms of contraception. Do not breast-feed while taking this medication and for 5 months after the last dose. What side effects may I notice from receiving this medication? Side effects that you should  report to your care team as soon as possible: Allergic reactions--skin rash, itching, hives, swelling of the face, lips, tongue, or throat Dry cough, shortness of breath or trouble breathing Eye pain, redness, irritation, or discharge with blurry or decreased vision Heart muscle inflammation--unusual weakness or fatigue, shortness of breath, chest pain, fast or irregular heartbeat, dizziness, swelling of the ankles, feet, or hands Hormone gland problems--headache, sensitivity to light, unusual weakness or fatigue, dizziness, fast or irregular heartbeat, increased sensitivity to cold or heat, excessive sweating, constipation, hair loss, increased thirst or amount of urine, tremors or shaking, irritability Infusion reactions--chest pain, shortness of breath or trouble breathing, feeling faint or lightheaded Kidney injury (glomerulonephritis)--decrease in the amount of urine, red or dark brown urine, foamy or bubbly urine, swelling of the ankles, hands, or feet Liver injury--right upper belly pain, loss of appetite, nausea, light-colored stool, dark yellow or brown urine, yellowing skin or eyes, unusual weakness or fatigue Pain, tingling, or numbness in the hands or feet, muscle weakness, change in vision, confusion or trouble speaking, loss of balance or coordination, trouble walking, seizures Rash, fever, and swollen lymph nodes Redness, blistering, peeling, or loosening of the skin, including inside  the mouth Sudden or severe stomach pain, bloody diarrhea, fever, nausea, vomiting Side effects that usually do not require medical attention (report these to your care team if they continue or are bothersome): Bone, joint, or muscle pain Diarrhea Fatigue Loss of appetite Nausea Skin rash This list may not describe all possible side effects. Call your doctor for medical advice about side effects. You may report side effects to FDA at 1-800-FDA-1088. Where should I keep my medication? This medication  is given in a hospital or clinic. It will not be stored at home. NOTE: This sheet is a summary. It may not cover all possible information. If you have questions about this medicine, talk to your doctor, pharmacist, or health care provider.  2024 Elsevier/Gold Standard (2022-03-17 00:00:00)      To help prevent nausea and vomiting after your treatment, we encourage you to take your nausea medication as directed.  BELOW ARE SYMPTOMS THAT SHOULD BE REPORTED IMMEDIATELY: *FEVER GREATER THAN 100.4 F (38 C) OR HIGHER *CHILLS OR SWEATING *NAUSEA AND VOMITING THAT IS NOT CONTROLLED WITH YOUR NAUSEA MEDICATION *UNUSUAL SHORTNESS OF BREATH *UNUSUAL BRUISING OR BLEEDING *URINARY PROBLEMS (pain or burning when urinating, or frequent urination) *BOWEL PROBLEMS (unusual diarrhea, constipation, pain near the anus) TENDERNESS IN MOUTH AND THROAT WITH OR WITHOUT PRESENCE OF ULCERS (sore throat, sores in mouth, or a toothache) UNUSUAL RASH, SWELLING OR PAIN  UNUSUAL VAGINAL DISCHARGE OR ITCHING   Items with * indicate a potential emergency and should be followed up as soon as possible or go to the Emergency Department if any problems should occur.  Please show the CHEMOTHERAPY ALERT CARD or IMMUNOTHERAPY ALERT CARD at check-in to the Emergency Department and triage nurse.  Should you have questions after your visit or need to cancel or reschedule your appointment, please contact [facility] - A DEPT OF [facility]  Dept: [phone]  and follow the prompts.  Office hours are 8:00 a.m. to 4:30 p.m. Monday - Friday. Please note that voicemails left after 4:00 p.m. may not be returned until the following business day.  We are closed weekends and major holidays. You have access to a nurse at all times for urgent questions. Please call the main number to the clinic Dept: [phone] and follow the prompts.   For any non-urgent questions, you may also contact your provider using  MyChart. We now offer e-Visits for anyone 36 and older to request care online for non-urgent symptoms. For details visit [email or web address].   Also download the MyChart app! Go to the app store, search MyChart, open the app, select [place], and log in with your MyChart username and password.

## 2024-08-31 NOTE — Telephone Encounter (Signed)
 [facility] CSW Progress Note  Clinical Social Worker returned spouse's 10/01 voicemail requesting a new copy of the [employer] application. CSW sent copy to [employer] staff for them to print off. Patient's Spouse is aware. Spouse will contact CSW once application is completed. No additional needs at this time. CSW continues to be available as patient continues through treatment.   Lizbeth Sprague, LCSW Clinical Social Worker [facility]

## 2024-08-31 NOTE — Patient Instructions (Signed)

## 2024-09-01 ENCOUNTER — Encounter: Payer: Self-pay | Admitting: Cardiology

## 2024-09-01 ENCOUNTER — Ambulatory Visit: Attending: Cardiology | Admitting: Cardiology

## 2024-09-01 VITALS — BP 94/55 | HR 65 | Ht 74.0 in | Wt 303.0 lb

## 2024-09-01 DIAGNOSIS — I712 Thoracic aortic aneurysm, without rupture, unspecified: Secondary | ICD-10-CM | POA: Diagnosis not present

## 2024-09-01 DIAGNOSIS — I10 Essential (primary) hypertension: Secondary | ICD-10-CM

## 2024-09-01 MED ORDER — BIDIL 20-37.5 MG PO TABS: 2.0000 | ORAL_TABLET | Freq: Two times a day (BID) | ORAL | Status: AC

## 2024-09-01 NOTE — Patient Instructions (Signed)
 Medication Instructions:  DECREASE BiDil  to twice daily   *If you need a refill on your cardiac medications before your next appointment, please call your pharmacy*  Testing/Procedures: Canceled echocardiogram and CT Aorta  Follow-Up: At [facility], you and your health needs are our priority.  As part of our continuing mission to provide you with exceptional heart care, our providers are all part of one team.  This team includes your primary Cardiologist (physician) and Advanced Practice Providers or APPs (Physician Assistants and Nurse Practitioners) who all work together to provide you with the care you need, when you need it.  Your next appointment:   6 month(s)  Provider:   Newman JINNY Lawrence, MD

## 2024-09-01 NOTE — Progress Notes (Signed)
 Cardiology Office Note:  .   Date:  09/01/2024  ID:  Bradley Alvarez, DOB 1941/05/26, MRN [id number] PCP: [name], MD  [facility] Providers Cardiologist:  Newman Lawrence, MD PCP: [name], MD  Chief Complaint  Patient presents with   Thoracic aorta aneurysm      History of Present Illness: .    Bradley Alvarez is a 83 y.o. 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  Upper endoscopy in 07/2024 showed a large fungating ulcerated noncircumferential mass on lesser curvature of the stomach.  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  PET scan showed hypermetabolic gastric mass, but no evidence of metastatic disease.  He is seeing [facility], is currently on ipilimumab /nivolumab  immuno therapy.  He is tolerating immunotherapy well and is in good spirit.  Denies any complaints of chest pain, leg edema.  Mild exertional dyspnea stable and unchanged.  Blood pressure is low normal today, but has been normal otherwise.  Denies any lightheadedness symptoms.   Vitals:   09/01/24 0813  BP: (!) 94/55  Pulse: 65  SpO2: 92%       ROS:  Review of Systems  Cardiovascular:  Positive for dyspnea on exertion (Stable, unchanged). Negative for chest pain, leg swelling, palpitations and syncope.  Neurological:  Negative for light-headedness.     Studies Reviewed: SABRA        Independently interpreted 08/2024: HbA1C 7.2% Hb 10.1 Cr 1.29, Na 134, eGFR 55   05/31/2024: Hb 8.6 Cr 1.64, eGFR 41 Trop HS 38, 26   Latest Reference Range & Units 07/16/21 04:35 05/31/24 20:54  Hemoglobin 13.0 - 17.0 g/dL 87.9 (L) 8.6 (L)  (L): Data is abnormally low  CT chest 03/2024: 1. Stable aneurysm of the ascending thoracic aorta measuring 48 mm. 2. 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  CT chest 10/21/2023: 1.  Aneurysmal dilatation of the thoracic aorta, measuring up to 4.8 cm in the ascending aorta, previously 4.7 cm. Ascending thoracic aortic aneurysm. Recommend semi-annual imaging followup by CTA or MRA and referral to cardiothoracic surgery if not already obtained. This recommendation follows 2010 ACCF/AHA/AATS/ACR/ASA/SCA/SCAI/SIR/STS/SVM Guidelines for the Diagnosis and Management of Patients With Thoracic Aortic Disease. Circulation. 2010; 121: Z733-z630. Aortic aneurysm NOS (ICD10-I71.9) 2. Aneurysmal dilatation of the distal arch has mildly progressed from prior, currently 4.3 cm, previously 4.1 cm. Attention at follow-up recommended. 3. Dilated main pulmonary artery, can be seen with pulmonary arterial hypertension. 4. Elevated right hemidiaphragm with adjacent atelectasis/scarring. 5. Cholelithiasis without cholecystitis. 6. Borderline hepatic steatosis.   Aortic Atherosclerosis (ICD10-I70.0).   Abdominal Aortic Duplex 08/14/2023:  There is ectasia of the proximal abdominal aorta measuring at the greatest  diameter of 2.7 cm.  No AAA noted. There is diffuse heterogeneous plaque noted int he abdominal  aorta.   Normal bilateral CIA size and flow velocity.  Compared to 09/21/2020, 3 cm AAA not present. Further studies if  clinically indicated.    Physical Exam:   Physical Exam Vitals and nursing note reviewed.  Constitutional:      General: He is not in acute distress.    Comments: On supplemental oxygen   Neck:     Vascular: No JVD.  Cardiovascular:     Rate and Rhythm: Normal rate and regular rhythm.     Heart sounds: Normal heart sounds. No murmur heard. Pulmonary:     Effort: Pulmonary effort  is normal.     Breath sounds: Normal breath sounds. No wheezing or rales.  Musculoskeletal:     Right lower leg: No edema.     Left lower leg: No edema.      VISIT DIAGNOSES:   ICD-10-CM   1. Thoracic aortic aneurysm without rupture, unspecified part  I71.20     2.  Essential hypertension  I10          ASSESSMENT AND PLAN: .    Bradley Alvarez is a 83 y.o. 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  Suspect this was due to the stomach cancer, symptoms have improved now.  Continue management of stomach cancer as per cancer center.  Primary hypertension: Blood pressure is low today.  Reduce BiDil  from 3 times daily to 2 times daily. Further reduction of blood pressure medications as needed, if patient has consistently low normal blood pressure numbers.    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 (03/2024). Annual imaging and f/u w/Dr. Lucas.   He had CTA scheduled for November.  I think he needs it in 6 months given that aorta measurements have stayed stable.  I will order it at next visit to be performed sometime in spring 2026. Continue losartan , carvedilol  No AAA noted on recent ultrasound duplex 03/2023.   Coronary artery disease: Seen on CTA No angina symptoms at this time. In presence of eliquis  for PE treatment, do not recommend aspirin .   Lipids well-controlled.  Moderate asthma, elevated right hemidiaphragm: Stable, on supplemental oxygen , management as per pulmonology  I wished him well for treatment of gastric cancer.   F/u in 6 months  Signed, Newman JINNY Lawrence, MD

## 2024-09-02 ENCOUNTER — Telehealth: Payer: Self-pay | Admitting: *Deleted

## 2024-09-02 NOTE — Telephone Encounter (Signed)
 Mrs. Zinni left VM that his BP has been lower and cardiology has had to change his medication dose of Bidil  to bid dosing now. Asking if taking BP in right arm where his port is has any impact on this. Called back and left VM that it is OK to take BP in either arm. Port does not effect that.

## 2024-09-14 ENCOUNTER — Inpatient Hospital Stay

## 2024-09-14 ENCOUNTER — Inpatient Hospital Stay: Admitting: Oncology

## 2024-09-14 ENCOUNTER — Other Ambulatory Visit

## 2024-09-14 VITALS — BP 127/63 | HR 49 | Temp 97.5°F | Resp 17

## 2024-09-14 VITALS — BP 123/81 | HR 63 | Temp 98.0°F | Resp 18 | Ht 74.0 in | Wt 299.8 lb

## 2024-09-14 DIAGNOSIS — Z5112 Encounter for antineoplastic immunotherapy: Secondary | ICD-10-CM | POA: Diagnosis not present

## 2024-09-14 DIAGNOSIS — Z23 Encounter for immunization: Secondary | ICD-10-CM

## 2024-09-14 DIAGNOSIS — C169 Malignant neoplasm of stomach, unspecified: Secondary | ICD-10-CM | POA: Diagnosis not present

## 2024-09-14 LAB — CBC WITH DIFFERENTIAL (CANCER CENTER ONLY)
Abs Immature Granulocytes: 0.02 K/uL (ref 0.00–0.07)
Basophils Absolute: 0 K/uL (ref 0.0–0.1)
Basophils Relative: 0 %
Eosinophils Absolute: 0.3 K/uL (ref 0.0–0.5)
Eosinophils Relative: 4 %
HCT: 34.4 % — ABNORMAL LOW (ref 39.0–52.0)
Hemoglobin: 10.4 g/dL — ABNORMAL LOW (ref 13.0–17.0)
Immature Granulocytes: 0 %
Lymphocytes Relative: 25 %
Lymphs Abs: 1.7 K/uL (ref 0.7–4.0)
MCH: 23.6 pg — ABNORMAL LOW (ref 26.0–34.0)
MCHC: 30.2 g/dL (ref 30.0–36.0)
MCV: 78.2 fL — ABNORMAL LOW (ref 80.0–100.0)
Monocytes Absolute: 1.1 K/uL — ABNORMAL HIGH (ref 0.1–1.0)
Monocytes Relative: 16 %
Neutro Abs: 3.8 K/uL (ref 1.7–7.7)
Neutrophils Relative %: 55 %
Platelet Count: 166 K/uL (ref 150–400)
RBC: 4.4 MIL/uL (ref 4.22–5.81)
RDW: 19.2 % — ABNORMAL HIGH (ref 11.5–15.5)
WBC Count: 6.9 K/uL (ref 4.0–10.5)
nRBC: 0 % (ref 0.0–0.2)

## 2024-09-14 LAB — CMP (CANCER CENTER ONLY)
ALT: 12 U/L (ref 0–44)
AST: 25 U/L (ref 15–41)
Albumin: 3.6 g/dL (ref 3.5–5.0)
Alkaline Phosphatase: 46 U/L (ref 38–126)
Anion gap: 7 (ref 5–15)
BUN: 13 mg/dL (ref 8–23)
CO2: 25 mmol/L (ref 22–32)
Calcium: 9.2 mg/dL (ref 8.9–10.3)
Chloride: 104 mmol/L (ref 98–111)
Creatinine: 1.24 mg/dL (ref 0.61–1.24)
GFR, Estimated: 58 mL/min — ABNORMAL LOW (ref >60)
Glucose, Bld: 113 mg/dL — ABNORMAL HIGH (ref 70–99)
Potassium: 4.6 mmol/L (ref 3.5–5.1)
Sodium: 137 mmol/L (ref 135–145)
Total Bilirubin: 0.4 mg/dL (ref 0.0–1.2)
Total Protein: 7 g/dL (ref 6.5–8.1)

## 2024-09-14 MED ORDER — SODIUM CHLORIDE 0.9 % IV SOLN
240.0000 mg | Freq: Once | INTRAVENOUS | Status: AC
  Administered 2024-09-14: 240 mg via INTRAVENOUS
  Filled 2024-09-14: qty 24

## 2024-09-14 MED ORDER — SODIUM CHLORIDE 0.9 % IV SOLN: INTRAVENOUS | Status: DC

## 2024-09-14 MED ORDER — INFLUENZA VAC SPLIT HIGH-DOSE 0.5 ML IM SUSY
0.5000 mL | PREFILLED_SYRINGE | Freq: Once | INTRAMUSCULAR | Status: AC
  Administered 2024-09-14: 0.5 mL via INTRAMUSCULAR
  Filled 2024-09-14: qty 0.5

## 2024-09-14 NOTE — Patient Instructions (Signed)

## 2024-09-14 NOTE — Progress Notes (Signed)
 Patient seen by Dr. Arley Hof today  Vitals are within treatment parameters:Yes   Labs are within treatment parameters: Yes   Treatment plan has been signed: Yes   Per physician team, Patient is ready for treatment and there are NO modifications to the treatment plan.  Wants flu vaccine today. Order is in.

## 2024-09-14 NOTE — Progress Notes (Signed)
  [facility] OFFICE PROGRESS NOTE   Diagnosis: Gastric cancer  INTERVAL HISTORY:   Bradley Alvarez completed another cycle of nivolumab  08/31/2024.  No rash or diarrhea.  No bleeding.  He reports constipation and knee pain.  Good appetite.  Objective:  Vital signs in last 24 hours:  Blood pressure 123/81, pulse 63, temperature 98 F (36.7 C), temperature source Temporal, resp. rate 18, height 6' 2 (1.88 m), weight 299 lb 12.8 oz (136 kg), SpO2 92%.  Resp: Lungs clear bilaterally Cardio: Regular rate and rhythm GI: Nontender, no mass, no hepatosplenomegaly Vascular: The right lower leg is larger than the left side  Skin: No rash  Portacath/PICC-without erythema  Lab Results:  Lab Results  Component Value Date   WBC 6.9 09/14/2024   HGB 10.4 (L) 09/14/2024   HCT 34.4 (L) 09/14/2024   MCV 78.2 (L) 09/14/2024   PLT 166 09/14/2024   NEUTROABS 3.8 09/14/2024    CMP  Lab Results  Component Value Date   NA 137 09/14/2024   K 4.6 09/14/2024   CL 104 09/14/2024   CO2 25 09/14/2024   GLUCOSE 113 (H) 09/14/2024   BUN 13 09/14/2024   CREATININE 1.24 09/14/2024   CALCIUM 9.2 09/14/2024   PROT 7.0 09/14/2024   ALBUMIN  3.6 09/14/2024   AST 25 09/14/2024   ALT 12 09/14/2024   ALKPHOS 46 09/14/2024   BILITOT 0.4 09/14/2024   GFRNONAA 58 (L) 09/14/2024   GFRAA 57 (L) 11/09/2020    Lab Results  Component Value Date   CEA <1.00 07/12/2024     Medications: I have reviewed the patient's current medications.   Assessment/Plan:  Gastric cancer Upper endoscopy 07/01/2024-large fungating, ulcerated, noncircumferential mass on the lesser curvature of the stomach.  Pathology-invasive moderately differentiated adenocarcinoma; HER2 negative, loss of MLH1 and PMS2, Claudin 18-negative, PD-L1 CPS-50, MLH1 methylation detected, BRAF-negative; foundation 1-MSI high, tumor mutation burden 41 CTs 07/06/2024-enhancing lesser curvature wall thickening/mass; mesenteric root fat  stranding and multiple retroperitoneal/periaortic and celiac lymph nodes; new right upper lobe upper lobe pulmonary nodule; multiple prominent mediastinal lymph nodes 07/29/2024 PET scan-hypermetabolic gastric mass.  No evidence of metastatic disease. Cycle 1 ipilimumab /nivolumab  08/17/2024, day 15 nivolumab  08/31/2024, day 22 nivolumab  09/14/2024   Iron  deficiency anemia  Oral iron  initiated Hemoglobin improved 08/17/2024 CAD/CHF Diabetes Hypertension CKD Obstructive sleep apnea History of pulmonary embolus August 2022-previously on apixaban .  Apixaban  stopped due to bleeding. Ascending aortic aneurysm Admission 07/14/2024 with pneumonia and respiratory failure    Disposition: Bradley Alvarez appears well.  He is tolerating the ipilimumab /nivolumab  without significant toxicity.  He will complete another treatment with nivolumab  today.  He will return for an office visit and ipilimumab /nivolumab  in 2 weeks.  Bradley Alvarez received an influenza vaccine today.  Arley Hof, MD  09/14/2024  11:11 AM

## 2024-09-14 NOTE — Patient Instructions (Signed)
 [facility] - A DEPT OF [facility]  Discharge Instructions: Thank you for choosing [facility] to provide your oncology and hematology care.   If you have a lab appointment with the Cancer Center, please go directly to the Cancer Center and check in at the registration area.   Wear comfortable clothing and clothing appropriate for easy access to any Portacath or PICC line.   We strive to give you quality time with your provider. You may need to reschedule your appointment if you arrive late (15 or more minutes).  Arriving late affects you and other patients whose appointments are after yours.  Also, if you miss three or more appointments without notifying the office, you may be dismissed from the clinic at the provider's discretion.      For prescription refill requests, have your pharmacy contact our office and allow 72 hours for refills to be completed.    Today you received the following chemotherapy and/or immunotherapy agents: opdivo       To help prevent nausea and vomiting after your treatment, we encourage you to take your nausea medication as directed.  BELOW ARE SYMPTOMS THAT SHOULD BE REPORTED IMMEDIATELY: *FEVER GREATER THAN 100.4 F (38 C) OR HIGHER *CHILLS OR SWEATING *NAUSEA AND VOMITING THAT IS NOT CONTROLLED WITH YOUR NAUSEA MEDICATION *UNUSUAL SHORTNESS OF BREATH *UNUSUAL BRUISING OR BLEEDING *URINARY PROBLEMS (pain or burning when urinating, or frequent urination) *BOWEL PROBLEMS (unusual diarrhea, constipation, pain near the anus) TENDERNESS IN MOUTH AND THROAT WITH OR WITHOUT PRESENCE OF ULCERS (sore throat, sores in mouth, or a toothache) UNUSUAL RASH, SWELLING OR PAIN  UNUSUAL VAGINAL DISCHARGE OR ITCHING   Items with * indicate a potential emergency and should be followed up as soon as possible or go to the Emergency Department if any problems should occur.  Please show the CHEMOTHERAPY ALERT CARD or IMMUNOTHERAPY  ALERT CARD at check-in to the Emergency Department and triage nurse.  Should you have questions after your visit or need to cancel or reschedule your appointment, please contact [facility] - A DEPT OF [facility]  Dept: [phone]  and follow the prompts.  Office hours are 8:00 a.m. to 4:30 p.m. Monday - Friday. Please note that voicemails left after 4:00 p.m. may not be returned until the following business day.  We are closed weekends and major holidays. You have access to a nurse at all times for urgent questions. Please call the main number to the clinic Dept: [phone] and follow the prompts.   For any non-urgent questions, you may also contact your provider using MyChart. We now offer e-Visits for anyone 87 and older to request care online for non-urgent symptoms. For details visit [email or web address].   Also download the MyChart app! Go to the app store, search MyChart, open the app, select [place], and log in with your MyChart username and password.

## 2024-09-15 ENCOUNTER — Encounter: Payer: Self-pay | Admitting: Oncology

## 2024-09-15 DIAGNOSIS — G4733 Obstructive sleep apnea (adult) (pediatric): Secondary | ICD-10-CM | POA: Diagnosis not present

## 2024-09-20 ENCOUNTER — Inpatient Hospital Stay

## 2024-09-20 ENCOUNTER — Other Ambulatory Visit (HOSPITAL_COMMUNITY)

## 2024-09-20 NOTE — Progress Notes (Signed)
 [facility] CSW Progress Note  Clinical Child psychotherapist contacted spouse to follow up on financial concerns.    Financial Assistance CSW submitted SNAP application on patient's behalf on 08/30/2024. Patient/ Family was denied SNAP benefits due to income levels.   CSW provided education on [facility] Application.   CSW reviewed copay assistance organizations: [employer] (no funds available), Cancer Care (no funds available), TANF (waitlist available), and PAN (no funds available).    Interventions: Referred patient to [employer]AN[employer] for the Gastric Cancer Co Pay Assistance Wait list.       Follow Up Plan:  No follow up scheduled. CSW will await submission of [facility] Financial Assistance. Patient have direct contact if needed.    Lizbeth Sprague, LCSW Clinical Social Worker [facility]

## 2024-09-29 ENCOUNTER — Encounter: Payer: Self-pay | Admitting: Nurse Practitioner

## 2024-09-29 ENCOUNTER — Inpatient Hospital Stay

## 2024-09-29 ENCOUNTER — Inpatient Hospital Stay: Admitting: Nurse Practitioner

## 2024-09-29 ENCOUNTER — Encounter: Payer: Self-pay | Admitting: Pulmonary Disease

## 2024-09-29 ENCOUNTER — Encounter: Payer: Self-pay | Admitting: Oncology

## 2024-09-29 ENCOUNTER — Other Ambulatory Visit (HOSPITAL_BASED_OUTPATIENT_CLINIC_OR_DEPARTMENT_OTHER): Payer: Self-pay

## 2024-09-29 VITALS — BP 145/77 | HR 54 | Resp 18

## 2024-09-29 VITALS — BP 106/62 | HR 66 | Temp 97.8°F | Resp 18 | Ht 74.0 in | Wt 298.7 lb

## 2024-09-29 DIAGNOSIS — I10 Essential (primary) hypertension: Secondary | ICD-10-CM | POA: Diagnosis not present

## 2024-09-29 DIAGNOSIS — C169 Malignant neoplasm of stomach, unspecified: Secondary | ICD-10-CM

## 2024-09-29 DIAGNOSIS — Z79899 Other long term (current) drug therapy: Secondary | ICD-10-CM | POA: Diagnosis not present

## 2024-09-29 DIAGNOSIS — E782 Mixed hyperlipidemia: Secondary | ICD-10-CM | POA: Diagnosis not present

## 2024-09-29 DIAGNOSIS — D509 Iron deficiency anemia, unspecified: Secondary | ICD-10-CM | POA: Diagnosis not present

## 2024-09-29 DIAGNOSIS — K5900 Constipation, unspecified: Secondary | ICD-10-CM | POA: Diagnosis not present

## 2024-09-29 DIAGNOSIS — Z23 Encounter for immunization: Secondary | ICD-10-CM | POA: Diagnosis not present

## 2024-09-29 DIAGNOSIS — I5042 Chronic combined systolic (congestive) and diastolic (congestive) heart failure: Secondary | ICD-10-CM | POA: Diagnosis not present

## 2024-09-29 DIAGNOSIS — Z6841 Body Mass Index (BMI) 40.0 and over, adult: Secondary | ICD-10-CM | POA: Diagnosis not present

## 2024-09-29 DIAGNOSIS — Z794 Long term (current) use of insulin: Secondary | ICD-10-CM | POA: Diagnosis not present

## 2024-09-29 DIAGNOSIS — I130 Hypertensive heart and chronic kidney disease with heart failure and stage 1 through stage 4 chronic kidney disease, or unspecified chronic kidney disease: Secondary | ICD-10-CM | POA: Diagnosis not present

## 2024-09-29 DIAGNOSIS — I7121 Aneurysm of the ascending aorta, without rupture: Secondary | ICD-10-CM | POA: Diagnosis not present

## 2024-09-29 DIAGNOSIS — I714 Abdominal aortic aneurysm, without rupture, unspecified: Secondary | ICD-10-CM | POA: Diagnosis not present

## 2024-09-29 DIAGNOSIS — M1990 Unspecified osteoarthritis, unspecified site: Secondary | ICD-10-CM | POA: Diagnosis not present

## 2024-09-29 DIAGNOSIS — E1122 Type 2 diabetes mellitus with diabetic chronic kidney disease: Secondary | ICD-10-CM | POA: Diagnosis not present

## 2024-09-29 DIAGNOSIS — Z7951 Long term (current) use of inhaled steroids: Secondary | ICD-10-CM | POA: Diagnosis not present

## 2024-09-29 DIAGNOSIS — R911 Solitary pulmonary nodule: Secondary | ICD-10-CM | POA: Diagnosis not present

## 2024-09-29 DIAGNOSIS — Z5112 Encounter for antineoplastic immunotherapy: Secondary | ICD-10-CM | POA: Diagnosis not present

## 2024-09-29 DIAGNOSIS — G4733 Obstructive sleep apnea (adult) (pediatric): Secondary | ICD-10-CM | POA: Diagnosis not present

## 2024-09-29 DIAGNOSIS — Z86711 Personal history of pulmonary embolism: Secondary | ICD-10-CM | POA: Diagnosis not present

## 2024-09-29 LAB — CBC WITH DIFFERENTIAL (CANCER CENTER ONLY)
Abs Immature Granulocytes: 0.01 K/uL (ref 0.00–0.07)
Basophils Absolute: 0 K/uL (ref 0.0–0.1)
Basophils Relative: 0 %
Eosinophils Absolute: 0.3 K/uL (ref 0.0–0.5)
Eosinophils Relative: 4 %
HCT: 37.3 % — ABNORMAL LOW (ref 39.0–52.0)
Hemoglobin: 11.2 g/dL — ABNORMAL LOW (ref 13.0–17.0)
Immature Granulocytes: 0 %
Lymphocytes Relative: 22 %
Lymphs Abs: 1.6 K/uL (ref 0.7–4.0)
MCH: 23.9 pg — ABNORMAL LOW (ref 26.0–34.0)
MCHC: 30 g/dL (ref 30.0–36.0)
MCV: 79.7 fL — ABNORMAL LOW (ref 80.0–100.0)
Monocytes Absolute: 1.2 K/uL — ABNORMAL HIGH (ref 0.1–1.0)
Monocytes Relative: 17 %
Neutro Abs: 4.1 K/uL (ref 1.7–7.7)
Neutrophils Relative %: 57 %
Platelet Count: 158 K/uL (ref 150–400)
RBC: 4.68 MIL/uL (ref 4.22–5.81)
RDW: 18.8 % — ABNORMAL HIGH (ref 11.5–15.5)
WBC Count: 7.2 K/uL (ref 4.0–10.5)
nRBC: 0 % (ref 0.0–0.2)

## 2024-09-29 LAB — CMP (CANCER CENTER ONLY)
ALT: 15 U/L (ref 0–44)
AST: 22 U/L (ref 15–41)
Albumin: 3.8 g/dL (ref 3.5–5.0)
Alkaline Phosphatase: 47 U/L (ref 38–126)
Anion gap: 9 (ref 5–15)
BUN: 15 mg/dL (ref 8–23)
CO2: 26 mmol/L (ref 22–32)
Calcium: 9.6 mg/dL (ref 8.9–10.3)
Chloride: 104 mmol/L (ref 98–111)
Creatinine: 1.36 mg/dL — ABNORMAL HIGH (ref 0.61–1.24)
GFR, Estimated: 52 mL/min — ABNORMAL LOW (ref >60)
Glucose, Bld: 117 mg/dL — ABNORMAL HIGH (ref 70–99)
Potassium: 4.3 mmol/L (ref 3.5–5.1)
Sodium: 139 mmol/L (ref 135–145)
Total Bilirubin: 0.4 mg/dL (ref 0.0–1.2)
Total Protein: 7.3 g/dL (ref 6.5–8.1)

## 2024-09-29 LAB — TSH: TSH: 3.51 u[IU]/mL (ref 0.350–4.500)

## 2024-09-29 MED ORDER — SODIUM CHLORIDE 0.9 % IV SOLN
240.0000 mg | Freq: Once | INTRAVENOUS | Status: AC
  Administered 2024-09-29: 240 mg via INTRAVENOUS
  Filled 2024-09-29: qty 24

## 2024-09-29 MED ORDER — SODIUM CHLORIDE 0.9 % IV SOLN
1.0000 mg/kg | Freq: Once | INTRAVENOUS | Status: AC
  Administered 2024-09-29: 150 mg via INTRAVENOUS
  Filled 2024-09-29: qty 30

## 2024-09-29 MED ORDER — METHOCARBAMOL 500 MG PO TABS
500.0000 mg | ORAL_TABLET | Freq: Every day | ORAL | 1 refills | Status: DC
  Filled 2024-09-29: qty 30, 30d supply, fill #0
  Filled 2024-10-24 (×2): qty 30, 30d supply, fill #1

## 2024-09-29 MED ORDER — SODIUM CHLORIDE 0.9 % IV SOLN: INTRAVENOUS | Status: DC

## 2024-09-29 MED ORDER — FAMOTIDINE IN NACL 20-0.9 MG/50ML-% IV SOLN
20.0000 mg | Freq: Once | INTRAVENOUS | Status: AC
  Administered 2024-09-29: 20 mg via INTRAVENOUS
  Filled 2024-09-29: qty 50

## 2024-09-29 MED ORDER — DIPHENHYDRAMINE HCL 50 MG/ML IJ SOLN
25.0000 mg | Freq: Once | INTRAMUSCULAR | Status: AC
  Administered 2024-09-29: 25 mg via INTRAVENOUS
  Filled 2024-09-29: qty 1

## 2024-09-29 NOTE — Progress Notes (Signed)
 Patient seen by [name] NP today  Vitals are within treatment parameters:Yes   Labs are within treatment parameters: Yes   Treatment plan has been signed: Yes   Per physician team, Patient is ready for treatment and there are NO modifications to the treatment plan.

## 2024-09-29 NOTE — Patient Instructions (Signed)

## 2024-09-29 NOTE — Patient Instructions (Signed)
 [facility] - A DEPT OF [facility]  Discharge Instructions: Thank you for choosing [facility] to provide your oncology and hematology care.   If you have a lab appointment with the Cancer Center, please go directly to the Cancer Center and check in at the registration area.   Wear comfortable clothing and clothing appropriate for easy access to any Portacath or PICC line.   We strive to give you quality time with your provider. You may need to reschedule your appointment if you arrive late (15 or more minutes).  Arriving late affects you and other patients whose appointments are after yours.  Also, if you miss three or more appointments without notifying the office, you may be dismissed from the clinic at the provider's discretion.      For prescription refill requests, have your pharmacy contact our office and allow 72 hours for refills to be completed.    Today you received the following chemotherapy and/or immunotherapy agents: nivolumab   (Opdivo ), ipilimumab  (yervoy )  Ipilimumab  Injection What is this medication? IPILIMUMAB  (IP i LIM ue mab) treats some types of cancer. It works by helping your immune system slow or stop the spread of cancer cells. It is a monoclonal antibody. This medicine may be used for other purposes; ask your health care provider or pharmacist if you have questions. COMMON BRAND NAME(S): YERVOY  What should I tell my care team before I take this medication? They need to know if you have any of these conditions: Allogeneic stem cell transplant (uses someone else's stem cells) Autoimmune diseases, such as Crohn disease, ulcerative colitis, lupus Nervous system problems, such as Guillain-Barre syndrome or myasthenia gravis Organ transplant An unusual or allergic reaction to ipilimumab , other medications, foods, dyes, or preservatives Pregnant or trying to get pregnant Breast-feeding How should I use this  medication? This medication is infused into a vein. It is given by your care team in a hospital or clinic setting. A special MedGuide will be given to you before each treatment. Be sure to read this information carefully each time. Talk to your care team about the use of this medication in children. While it may be prescribed for children as young as 12 years for selected conditions, precautions do apply. Overdosage: If you think you have taken too much of this medicine contact a poison control center or emergency room at once. NOTE: This medicine is only for you. Do not share this medicine with others. What if I miss a dose? Keep appointments for follow-up doses. It is important not to miss your dose. Call your care team if you are unable to keep an appointment. What may interact with this medication? Interactions are not expected. This list may not describe all possible interactions. Give your health care provider a list of all the medicines, herbs, non-prescription drugs, or dietary supplements you use. Also tell them if you smoke, drink alcohol , or use illegal drugs. Some items may interact with your medicine. What should I watch for while using this medication? Your condition will be monitored carefully while you are receiving this medication. You may need blood work while taking this medication. This medication may cause serious skin reactions. They can happen weeks to months after starting the medication. Contact your care team right away if you notice fevers or flu-like symptoms with a rash. The rash may be red or purple and then turn into blisters or peeling of the skin. You may also notice a red  rash with swelling of the face, lips, or lymph nodes in your neck or under your arms. Tell your care team right away if you have any change in your eyesight. Talk to your care team if you may be pregnant. Serious birth defects can occur if you take this medication during pregnancy and for 3 months  after the last dose. You will need a negative pregnancy test before starting this medication. Contraception is recommended while taking this medication and for 3 months after the last dose. Your care team can help you find the option that works for you. Do not breastfeed while taking this medication and for 3 months after the last dose. What side effects may I notice from receiving this medication? Side effects that you should report to your care team as soon as possible: Allergic reactions--skin rash, itching, hives, swelling of the face, lips, tongue, or throat Dry cough, shortness of breath or trouble breathing Eye pain, redness, irritation, or discharge with blurry or decreased vision Heart muscle inflammation--unusual weakness or fatigue, shortness of breath, chest pain, fast or irregular heartbeat, dizziness, swelling of the ankles, feet, or hands Hormone gland problems--headache, sensitivity to light, unusual weakness or fatigue, dizziness, fast or irregular heartbeat, increased sensitivity to cold or heat, excessive sweating, constipation, hair loss, increased thirst or amount of urine, tremors or shaking, irritability Infusion reactions--chest pain, shortness of breath or trouble breathing, feeling faint or lightheaded Kidney injury (glomerulonephritis)--decrease in the amount of urine, red or dark brown urine, foamy or bubbly urine, swelling of the ankles, hands, or feet Liver injury--right upper belly pain, loss of appetite, nausea, light-colored stool, dark yellow or brown urine, yellowing skin or eyes, unusual weakness or fatigue Pain, tingling, or numbness in the hands or feet, muscle weakness, change in vision, confusion or trouble speaking, loss of balance or coordination, trouble walking, seizures Rash, fever, and swollen lymph nodes Redness, blistering, peeling, or loosening of the skin, including inside the mouth Sudden or severe stomach pain, bloody diarrhea, fever, nausea,  vomiting Side effects that usually do not require medical attention (report to your care team if they continue or are bothersome): Bone, joint, or muscle pain Diarrhea Fatigue Loss of appetite Nausea Skin rash This list may not describe all possible side effects. Call your doctor for medical advice about side effects. You may report side effects to FDA at 1-800-FDA-1088. Where should I keep my medication? This medication is given in a hospital or clinic. It will not be stored at home. NOTE: This sheet is a summary. It may not cover all possible information. If you have questions about this medicine, talk to your doctor, pharmacist, or health care provider.  2024 Elsevier/Gold Standard (2022-04-04 00:00:00)  Nivolumab  Injection What is this medication? NIVOLUMAB  (nye VOL ue mab) treats some types of cancer. It works by helping your immune system slow or stop the spread of cancer cells. It is a monoclonal antibody. This medicine may be used for other purposes; ask your health care provider or pharmacist if you have questions. COMMON BRAND NAME(S): Opdivo  What should I tell my care team before I take this medication? They need to know if you have any of these conditions: Allogeneic stem cell transplant (uses someone else's stem cells) Autoimmune diseases, such as Crohn disease, ulcerative colitis, lupus History of chest radiation Nervous system problems, such as Guillain-Barre syndrome or myasthenia gravis Organ transplant An unusual or allergic reaction to nivolumab , other medications, foods, dyes, or preservatives Pregnant or trying to get pregnant  Breast-feeding How should I use this medication? This medication is infused into a vein. It is given in a hospital or clinic setting. A special MedGuide will be given to you before each treatment. Be sure to read this information carefully each time. Talk to your care team about the use of this medication in children. While it may be  prescribed for children as young as 12 years for selected conditions, precautions do apply. Overdosage: If you think you have taken too much of this medicine contact a poison control center or emergency room at once. NOTE: This medicine is only for you. Do not share this medicine with others. What if I miss a dose? Keep appointments for follow-up doses. It is important not to miss your dose. Call your care team if you are unable to keep an appointment. What may interact with this medication? Interactions have not been studied. This list may not describe all possible interactions. Give your health care provider a list of all the medicines, herbs, non-prescription drugs, or dietary supplements you use. Also tell them if you smoke, drink alcohol , or use illegal drugs. Some items may interact with your medicine. What should I watch for while using this medication? Your condition will be monitored carefully while you are receiving this medication. You may need blood work while taking this medication. This medication may cause serious skin reactions. They can happen weeks to months after starting the medication. Contact your care team right away if you notice fevers or flu-like symptoms with a rash. The rash may be red or purple and then turn into blisters or peeling of the skin. You may also notice a red rash with swelling of the face, lips, or lymph nodes in your neck or under your arms. Tell your care team right away if you have any change in your eyesight. Talk to your care team if you are pregnant or think you might be pregnant. A negative pregnancy test is required before starting this medication. A reliable form of contraception is recommended while taking this medication and for 5 months after the last dose. Talk to your care team about effective forms of contraception. Do not breast-feed while taking this medication and for 5 months after the last dose. What side effects may I notice from receiving  this medication? Side effects that you should report to your care team as soon as possible: Allergic reactions--skin rash, itching, hives, swelling of the face, lips, tongue, or throat Dry cough, shortness of breath or trouble breathing Eye pain, redness, irritation, or discharge with blurry or decreased vision Heart muscle inflammation--unusual weakness or fatigue, shortness of breath, chest pain, fast or irregular heartbeat, dizziness, swelling of the ankles, feet, or hands Hormone gland problems--headache, sensitivity to light, unusual weakness or fatigue, dizziness, fast or irregular heartbeat, increased sensitivity to cold or heat, excessive sweating, constipation, hair loss, increased thirst or amount of urine, tremors or shaking, irritability Infusion reactions--chest pain, shortness of breath or trouble breathing, feeling faint or lightheaded Kidney injury (glomerulonephritis)--decrease in the amount of urine, red or dark brown urine, foamy or bubbly urine, swelling of the ankles, hands, or feet Liver injury--right upper belly pain, loss of appetite, nausea, light-colored stool, dark yellow or brown urine, yellowing skin or eyes, unusual weakness or fatigue Pain, tingling, or numbness in the hands or feet, muscle weakness, change in vision, confusion or trouble speaking, loss of balance or coordination, trouble walking, seizures Rash, fever, and swollen lymph nodes Redness, blistering, peeling, or loosening of  the skin, including inside the mouth Sudden or severe stomach pain, bloody diarrhea, fever, nausea, vomiting Side effects that usually do not require medical attention (report these to your care team if they continue or are bothersome): Bone, joint, or muscle pain Diarrhea Fatigue Loss of appetite Nausea Skin rash This list may not describe all possible side effects. Call your doctor for medical advice about side effects. You may report side effects to FDA at 1-800-FDA-1088. Where  should I keep my medication? This medication is given in a hospital or clinic. It will not be stored at home. NOTE: This sheet is a summary. It may not cover all possible information. If you have questions about this medicine, talk to your doctor, pharmacist, or health care provider.  2024 Elsevier/Gold Standard (2022-03-17 00:00:00)      To help prevent nausea and vomiting after your treatment, we encourage you to take your nausea medication as directed.  BELOW ARE SYMPTOMS THAT SHOULD BE REPORTED IMMEDIATELY: *FEVER GREATER THAN 100.4 F (38 C) OR HIGHER *CHILLS OR SWEATING *NAUSEA AND VOMITING THAT IS NOT CONTROLLED WITH YOUR NAUSEA MEDICATION *UNUSUAL SHORTNESS OF BREATH *UNUSUAL BRUISING OR BLEEDING *URINARY PROBLEMS (pain or burning when urinating, or frequent urination) *BOWEL PROBLEMS (unusual diarrhea, constipation, pain near the anus) TENDERNESS IN MOUTH AND THROAT WITH OR WITHOUT PRESENCE OF ULCERS (sore throat, sores in mouth, or a toothache) UNUSUAL RASH, SWELLING OR PAIN  UNUSUAL VAGINAL DISCHARGE OR ITCHING   Items with * indicate a potential emergency and should be followed up as soon as possible or go to the Emergency Department if any problems should occur.  Please show the CHEMOTHERAPY ALERT CARD or IMMUNOTHERAPY ALERT CARD at check-in to the Emergency Department and triage nurse.  Should you have questions after your visit or need to cancel or reschedule your appointment, please contact [facility]  Dept: [phone]  and follow the prompts.  Office hours are 8:00 a.m. to 4:30 p.m. Monday - Friday. Please note that voicemails left after 4:00 p.m. may not be returned until the following business day.  We are closed weekends and major holidays. You have access to a nurse at all times for urgent questions. Please call the main number to the clinic Dept: [phone] and follow the prompts.   For any non-urgent  questions, you may also contact your provider using MyChart. We now offer e-Visits for anyone 19 and older to request care online for non-urgent symptoms. For details visit [email or web address].   Also download the MyChart app! Go to the app store, search MyChart, open the app, select [facility], and log in with your MyChart username and password.

## 2024-09-29 NOTE — Progress Notes (Signed)
 Ipilimumab  (YERVOY ) Patient Monitoring Assessment   Is the patient experiencing any of the following general symptoms?:  [ ] Difficulty performing normal activities [ ] Feeling sluggish or cold all the time [ ] Unusual weight gain [ ] Constant or unusual headaches [ ] Feeling dizzy or faint [ ] Changes in eyesight (blurry vision, double vision, or other vision problems) [ ] Changes in mood or behavior (ex: decreased sex drive, irritability, or forgetfulness) [ ] Starting new medications (ex: steroids, other medications that lower immune response) [ X]Patient is not experiencing any of the general symptoms above.   Gastrointestinal  Patient is having 1 bowel movements each day.  Is this different from baseline? [ X]Yes [ ] No Are your stools watery or do they have a foul smell? [ ] Yes [X ]No Have you seen blood in your stools? [ ] Yes [ X]No Are your stools dark, tarry, or sticky? [ ] Yes [X ]No Are you having pain or tenderness in your belly? [ ] Yes [ X]No  Skin Does your skin itch? [X ]Yes [ ] No Do you have a rash? [ ] Yes [ X]No Has your skin blistered and/or peeled? [ ] Yes [ X]No Do you have sores in your mouth? [ ] Yes [X ]No  Hepatic Has your urine been dark or tea colored? [ ] Yes [ X]No Have you noticed that your skin or the whites of your eyes are turning yellow? [ ] Yes [X ]No Are you bleeding or bruising more easily than normal? [ ] Yes [ X]No Are you nauseous and/or vomiting? [ ] Yes [ X]No Do you have pain on the right side of your stomach? [ ] Yes [ X]No  Neurologic  Are you having unusual weakness of legs, arms, or face? [ ] Yes [ X]No Are you having numbness or tingling in your hands or feet? [ ] Yes [X ]No  Bradley Alvarez

## 2024-09-29 NOTE — Progress Notes (Signed)
  Bradley Alvarez OFFICE PROGRESS NOTE   Diagnosis: Gastric cancer  INTERVAL HISTORY:   Bradley Alvarez returns as scheduled.  He has completed 1 cycle of ipilimumab /nivolumab .  He is seen prior to beginning cycle 2.  He feels well.  He denies bleeding.  No rash or diarrhea.  Periodic constipation.  He takes MiraLAX  with good relief.  No nausea or vomiting.  Good appetite.  He denies pain.  No significant cough or shortness of breath.  Objective:  Vital signs in last 24 hours:  Blood pressure 106/62, pulse 66, temperature 97.8 F (36.6 C), temperature source Temporal, resp. rate 18, height 6' 2 (1.88 m), weight 298 lb 11.2 oz (135.5 kg), SpO2 92%.    HEENT: No thrush or ulcers. Resp: Lungs clear bilaterally. Cardio: Regular rate and rhythm. GI: No hepatosplenomegaly.  Nontender. Vascular: Right lower leg is larger than the left lower leg. Skin: No rash. Port-A-Cath without erythema.  Lab Results:  Lab Results  Component Value Date   WBC 7.2 09/29/2024   HGB 11.2 (L) 09/29/2024   HCT 37.3 (L) 09/29/2024   MCV 79.7 (L) 09/29/2024   PLT 158 09/29/2024   NEUTROABS 4.1 09/29/2024    Imaging:  No results found.  Medications: I have reviewed the patient's current medications.  Assessment/Plan: Gastric cancer Upper endoscopy 07/01/2024-large fungating, ulcerated, noncircumferential mass on the lesser curvature of the stomach.  Pathology-invasive moderately differentiated adenocarcinoma; HER2 negative, loss of MLH1 and PMS2, Claudin 18-negative, PD-L1 CPS-50, MLH1 methylation detected, BRAF-negative; foundation 1-MSI high, tumor mutation burden 41 CTs 07/06/2024-enhancing lesser curvature wall thickening/mass; mesenteric root fat stranding and multiple retroperitoneal/periaortic and celiac lymph nodes; new right upper lobe upper lobe pulmonary nodule; multiple prominent mediastinal lymph nodes 07/29/2024 PET scan-hypermetabolic gastric mass.  No evidence of metastatic  disease. Cycle 1 ipilimumab /nivolumab  08/17/2024, day 15 nivolumab  08/31/2024, day 29 nivolumab  09/14/2024 Cycle 2 ipilimumab /nivolumab  09/29/2024     Iron  deficiency anemia  Oral iron  initiated Hemoglobin improved 08/17/2024 CAD/CHF Diabetes Hypertension CKD Obstructive sleep apnea History of pulmonary embolus August 2022-previously on apixaban .  Apixaban  stopped due to bleeding. Ascending aortic aneurysm Admission 07/14/2024 with pneumonia and respiratory failure    Disposition: Bradley Alvarez appears stable.  He has completed 1 cycle Pembrolizumab/nivolumab .  He is tolerating treatment well.  He has a good performance status.  Plan to proceed with cycle 2-day 1 today as scheduled.  CBC reviewed.  Hemoglobin continues to improve.  He will return for follow-up and treatment in 2 weeks.  We are available to see him sooner if needed.      [name] ANP/GNP-BC   09/29/2024  11:27 AM

## 2024-09-30 LAB — T4: T4, Total: 7.8 ug/dL (ref 4.5–12.0)

## 2024-10-06 ENCOUNTER — Other Ambulatory Visit: Payer: Self-pay | Admitting: Oncology

## 2024-10-06 ENCOUNTER — Other Ambulatory Visit (HOSPITAL_BASED_OUTPATIENT_CLINIC_OR_DEPARTMENT_OTHER): Payer: Medicare HMO

## 2024-10-13 ENCOUNTER — Inpatient Hospital Stay (HOSPITAL_BASED_OUTPATIENT_CLINIC_OR_DEPARTMENT_OTHER): Admitting: Nurse Practitioner

## 2024-10-13 ENCOUNTER — Inpatient Hospital Stay

## 2024-10-13 ENCOUNTER — Encounter: Payer: Self-pay | Admitting: Nurse Practitioner

## 2024-10-13 ENCOUNTER — Inpatient Hospital Stay: Attending: Oncology

## 2024-10-13 ENCOUNTER — Other Ambulatory Visit

## 2024-10-13 VITALS — BP 150/82 | HR 51 | Temp 98.3°F | Resp 18

## 2024-10-13 VITALS — BP 147/67 | HR 55 | Temp 98.1°F | Resp 18 | Ht 74.0 in | Wt 300.0 lb

## 2024-10-13 DIAGNOSIS — I130 Hypertensive heart and chronic kidney disease with heart failure and stage 1 through stage 4 chronic kidney disease, or unspecified chronic kidney disease: Secondary | ICD-10-CM | POA: Insufficient documentation

## 2024-10-13 DIAGNOSIS — Z5112 Encounter for antineoplastic immunotherapy: Secondary | ICD-10-CM | POA: Insufficient documentation

## 2024-10-13 DIAGNOSIS — I2510 Atherosclerotic heart disease of native coronary artery without angina pectoris: Secondary | ICD-10-CM | POA: Diagnosis not present

## 2024-10-13 DIAGNOSIS — Z86711 Personal history of pulmonary embolism: Secondary | ICD-10-CM | POA: Diagnosis not present

## 2024-10-13 DIAGNOSIS — C169 Malignant neoplasm of stomach, unspecified: Secondary | ICD-10-CM | POA: Diagnosis not present

## 2024-10-13 DIAGNOSIS — I7121 Aneurysm of the ascending aorta, without rupture: Secondary | ICD-10-CM | POA: Diagnosis not present

## 2024-10-13 DIAGNOSIS — M549 Dorsalgia, unspecified: Secondary | ICD-10-CM | POA: Diagnosis not present

## 2024-10-13 DIAGNOSIS — R110 Nausea: Secondary | ICD-10-CM | POA: Diagnosis not present

## 2024-10-13 DIAGNOSIS — E1122 Type 2 diabetes mellitus with diabetic chronic kidney disease: Secondary | ICD-10-CM | POA: Diagnosis not present

## 2024-10-13 DIAGNOSIS — G8929 Other chronic pain: Secondary | ICD-10-CM | POA: Diagnosis not present

## 2024-10-13 DIAGNOSIS — M25569 Pain in unspecified knee: Secondary | ICD-10-CM | POA: Diagnosis not present

## 2024-10-13 DIAGNOSIS — L299 Pruritus, unspecified: Secondary | ICD-10-CM | POA: Insufficient documentation

## 2024-10-13 DIAGNOSIS — D509 Iron deficiency anemia, unspecified: Secondary | ICD-10-CM | POA: Diagnosis not present

## 2024-10-13 DIAGNOSIS — G4733 Obstructive sleep apnea (adult) (pediatric): Secondary | ICD-10-CM | POA: Insufficient documentation

## 2024-10-13 LAB — CBC WITH DIFFERENTIAL (CANCER CENTER ONLY)
Abs Immature Granulocytes: 0.02 K/uL (ref 0.00–0.07)
Basophils Absolute: 0 K/uL (ref 0.0–0.1)
Basophils Relative: 1 %
Eosinophils Absolute: 0.3 K/uL (ref 0.0–0.5)
Eosinophils Relative: 5 %
HCT: 37.1 % — ABNORMAL LOW (ref 39.0–52.0)
Hemoglobin: 11.4 g/dL — ABNORMAL LOW (ref 13.0–17.0)
Immature Granulocytes: 0 %
Lymphocytes Relative: 26 %
Lymphs Abs: 1.7 K/uL (ref 0.7–4.0)
MCH: 24.5 pg — ABNORMAL LOW (ref 26.0–34.0)
MCHC: 30.7 g/dL (ref 30.0–36.0)
MCV: 79.8 fL — ABNORMAL LOW (ref 80.0–100.0)
Monocytes Absolute: 1.1 K/uL — ABNORMAL HIGH (ref 0.1–1.0)
Monocytes Relative: 16 %
Neutro Abs: 3.4 K/uL (ref 1.7–7.7)
Neutrophils Relative %: 52 %
Platelet Count: 146 K/uL — ABNORMAL LOW (ref 150–400)
RBC: 4.65 MIL/uL (ref 4.22–5.81)
RDW: 17.5 % — ABNORMAL HIGH (ref 11.5–15.5)
WBC Count: 6.5 K/uL (ref 4.0–10.5)
nRBC: 0 % (ref 0.0–0.2)

## 2024-10-13 LAB — CMP (CANCER CENTER ONLY)
ALT: 13 U/L (ref 0–44)
AST: 21 U/L (ref 15–41)
Albumin: 3.7 g/dL (ref 3.5–5.0)
Alkaline Phosphatase: 50 U/L (ref 38–126)
Anion gap: 9 (ref 5–15)
BUN: 15 mg/dL (ref 8–23)
CO2: 24 mmol/L (ref 22–32)
Calcium: 9.1 mg/dL (ref 8.9–10.3)
Chloride: 105 mmol/L (ref 98–111)
Creatinine: 1.4 mg/dL — ABNORMAL HIGH (ref 0.61–1.24)
GFR, Estimated: 50 mL/min — ABNORMAL LOW (ref >60)
Glucose, Bld: 143 mg/dL — ABNORMAL HIGH (ref 70–99)
Potassium: 4.1 mmol/L (ref 3.5–5.1)
Sodium: 138 mmol/L (ref 135–145)
Total Bilirubin: 0.4 mg/dL (ref 0.0–1.2)
Total Protein: 7 g/dL (ref 6.5–8.1)

## 2024-10-13 MED ORDER — SODIUM CHLORIDE 0.9 % IV SOLN: INTRAVENOUS | Status: DC

## 2024-10-13 MED ORDER — SODIUM CHLORIDE 0.9 % IV SOLN
240.0000 mg | Freq: Once | INTRAVENOUS | Status: AC
  Administered 2024-10-13: 240 mg via INTRAVENOUS
  Filled 2024-10-13: qty 24

## 2024-10-13 NOTE — Progress Notes (Signed)
 Patient seen by [name] NP today  Vitals are within treatment parameters:Yes   Labs are within treatment parameters: Yes creatinine 1.40  Treatment plan has been signed: Yes   Per physician team, Patient is ready for treatment and there are NO modifications to the treatment plan.

## 2024-10-13 NOTE — Progress Notes (Signed)
  Bradley Alvarez OFFICE PROGRESS NOTE   Diagnosis: Gastric cancer  INTERVAL HISTORY:   Bradley Alvarez returns as scheduled.  He began cycle 2 ipilimumab /nivolumab  09/29/2024.  No rash or diarrhea.  A few small areas of pruritus, resolves with Benadryl .  Single episode of mild nausea yesterday when he was constipated.  No mouth sores.  He has a good appetite.  Energy level varies.  Stable chronic knee and back pain.  Stable mild dyspnea on exertion.  Objective:  Vital signs in last 24 hours:  Blood pressure (!) 147/67, pulse (!) 55, temperature 98.1 F (36.7 C), temperature source Temporal, resp. rate 18, height 6' 2 (1.88 m), weight 300 lb (136.1 kg), SpO2 98%.    HEENT: No thrush or ulcers. Resp: Distant breath sounds.  No respiratory distress. Cardio: Regular rate and rhythm. GI: No hepatosplenomegaly.  Nontender. Vascular: Right lower leg is larger than the left lower leg (chronic per patient report). Neuro: Alert and oriented. Skin: No rash.   Port-A-Cath without erythema.  Lab Results:  Lab Results  Component Value Date   WBC 6.5 10/13/2024   HGB 11.4 (L) 10/13/2024   HCT 37.1 (L) 10/13/2024   MCV 79.8 (L) 10/13/2024   PLT 146 (L) 10/13/2024   NEUTROABS 3.4 10/13/2024    Imaging:  No results found.  Medications: I have reviewed the patient's current medications.  Assessment/Plan: Gastric cancer Upper endoscopy 07/01/2024-large fungating, ulcerated, noncircumferential mass on the lesser curvature of the stomach.  Pathology-invasive moderately differentiated adenocarcinoma; HER2 negative, loss of MLH1 and PMS2, Claudin 18-negative, PD-L1 CPS-50, MLH1 methylation detected, BRAF-negative; foundation 1-MSI high, tumor mutation burden 41 CTs 07/06/2024-enhancing lesser curvature wall thickening/mass; mesenteric root fat stranding and multiple retroperitoneal/periaortic and celiac lymph nodes; new right upper lobe upper lobe pulmonary nodule; multiple prominent  mediastinal lymph nodes 07/29/2024 PET scan-hypermetabolic gastric mass.  No evidence of metastatic disease. Cycle 1 ipilimumab /nivolumab  08/17/2024, day 15 nivolumab  08/31/2024, day 29 nivolumab  09/14/2024 Cycle 2 ipilimumab /nivolumab  09/29/2024, day 15 nivolumab  10/13/2024     Iron  deficiency anemia  Oral iron  initiated Hemoglobin improved 08/17/2024 CAD/CHF Diabetes Hypertension CKD Obstructive sleep apnea History of pulmonary embolus August 2022-previously on apixaban .  Apixaban  stopped due to bleeding. Ascending aortic aneurysm Admission 07/14/2024 with pneumonia and respiratory failure  Disposition: Bradley Alvarez appears stable.  He is completing cycle 2 ipilimumab /nivolumab .  He continues to tolerate treatment well.  Plan to proceed with day 15 nivolumab  today as scheduled.  He has a few areas of pruritus in the absence of a rash.  He will try topical Benadryl , hydrocortisone, moisturizing lotion.  He understands to contact the office if he develops a rash.  CBC and chemistry panel reviewed.  Labs are adequate for treatment.  Hemoglobin continues to improve.  He will return for follow-up and nivolumab  in 2 weeks.  We are available to see him sooner if needed.  Bradley Alvarez ANP/GNP-BC   10/13/2024  1:57 PM

## 2024-10-13 NOTE — Patient Instructions (Signed)
 [facility] - A DEPT OF [facility]  Discharge Instructions: Thank you for choosing [facility] to provide your oncology and hematology care.   If you have a lab appointment with the Cancer Center, please go directly to the Cancer Center and check in at the registration area.   Wear comfortable clothing and clothing appropriate for easy access to any Portacath or PICC line.   We strive to give you quality time with your provider. You may need to reschedule your appointment if you arrive late (15 or more minutes).  Arriving late affects you and other patients whose appointments are after yours.  Also, if you miss three or more appointments without notifying the office, you may be dismissed from the clinic at the provider's discretion.      For prescription refill requests, have your pharmacy contact our office and allow 72 hours for refills to be completed.    Today you received the following chemotherapy and/or immunotherapy agents: opdivo       To help prevent nausea and vomiting after your treatment, we encourage you to take your nausea medication as directed.  BELOW ARE SYMPTOMS THAT SHOULD BE REPORTED IMMEDIATELY: *FEVER GREATER THAN 100.4 F (38 C) OR HIGHER *CHILLS OR SWEATING *NAUSEA AND VOMITING THAT IS NOT CONTROLLED WITH YOUR NAUSEA MEDICATION *UNUSUAL SHORTNESS OF BREATH *UNUSUAL BRUISING OR BLEEDING *URINARY PROBLEMS (pain or burning when urinating, or frequent urination) *BOWEL PROBLEMS (unusual diarrhea, constipation, pain near the anus) TENDERNESS IN MOUTH AND THROAT WITH OR WITHOUT PRESENCE OF ULCERS (sore throat, sores in mouth, or a toothache) UNUSUAL RASH, SWELLING OR PAIN  UNUSUAL VAGINAL DISCHARGE OR ITCHING   Items with * indicate a potential emergency and should be followed up as soon as possible or go to the Emergency Department if any problems should occur.  Please show the CHEMOTHERAPY ALERT CARD or IMMUNOTHERAPY  ALERT CARD at check-in to the Emergency Department and triage nurse.  Should you have questions after your visit or need to cancel or reschedule your appointment, please contact [facility] - A DEPT OF [facility]  Dept: [phone]  and follow the prompts.  Office hours are 8:00 a.m. to 4:30 p.m. Monday - Friday. Please note that voicemails left after 4:00 p.m. may not be returned until the following business day.  We are closed weekends and major holidays. You have access to a nurse at all times for urgent questions. Please call the main number to the clinic Dept: [phone] and follow the prompts.   For any non-urgent questions, you may also contact your provider using MyChart. We now offer e-Visits for anyone 87 and older to request care online for non-urgent symptoms. For details visit [email or web address].   Also download the MyChart app! Go to the app store, search MyChart, open the app, select [place], and log in with your MyChart username and password.

## 2024-10-13 NOTE — Patient Instructions (Signed)

## 2024-10-14 ENCOUNTER — Other Ambulatory Visit: Payer: Self-pay

## 2024-10-16 DIAGNOSIS — I2699 Other pulmonary embolism without acute cor pulmonale: Secondary | ICD-10-CM | POA: Diagnosis not present

## 2024-10-16 DIAGNOSIS — G4733 Obstructive sleep apnea (adult) (pediatric): Secondary | ICD-10-CM | POA: Diagnosis not present

## 2024-10-20 ENCOUNTER — Other Ambulatory Visit: Payer: Self-pay

## 2024-10-21 ENCOUNTER — Telehealth: Payer: Self-pay | Admitting: Oncology

## 2024-10-24 ENCOUNTER — Inpatient Hospital Stay: Admitting: Oncology

## 2024-10-24 ENCOUNTER — Ambulatory Visit

## 2024-10-24 ENCOUNTER — Encounter: Payer: Self-pay | Admitting: Oncology

## 2024-10-24 ENCOUNTER — Other Ambulatory Visit (HOSPITAL_BASED_OUTPATIENT_CLINIC_OR_DEPARTMENT_OTHER): Payer: Self-pay

## 2024-10-24 ENCOUNTER — Inpatient Hospital Stay

## 2024-10-24 ENCOUNTER — Other Ambulatory Visit: Payer: Self-pay | Admitting: *Deleted

## 2024-10-24 ENCOUNTER — Other Ambulatory Visit: Payer: Self-pay

## 2024-10-24 ENCOUNTER — Other Ambulatory Visit

## 2024-10-24 ENCOUNTER — Encounter: Payer: Self-pay | Admitting: Pulmonary Disease

## 2024-10-24 VITALS — BP 116/64 | HR 63 | Temp 97.8°F | Resp 18 | Ht 74.0 in | Wt 299.4 lb

## 2024-10-24 VITALS — BP 135/71 | HR 48 | Temp 97.7°F | Resp 18

## 2024-10-24 DIAGNOSIS — I7121 Aneurysm of the ascending aorta, without rupture: Secondary | ICD-10-CM | POA: Diagnosis not present

## 2024-10-24 DIAGNOSIS — M25569 Pain in unspecified knee: Secondary | ICD-10-CM | POA: Diagnosis not present

## 2024-10-24 DIAGNOSIS — G8929 Other chronic pain: Secondary | ICD-10-CM | POA: Diagnosis not present

## 2024-10-24 DIAGNOSIS — C169 Malignant neoplasm of stomach, unspecified: Secondary | ICD-10-CM

## 2024-10-24 DIAGNOSIS — G4733 Obstructive sleep apnea (adult) (pediatric): Secondary | ICD-10-CM | POA: Diagnosis not present

## 2024-10-24 DIAGNOSIS — L299 Pruritus, unspecified: Secondary | ICD-10-CM | POA: Diagnosis not present

## 2024-10-24 DIAGNOSIS — D509 Iron deficiency anemia, unspecified: Secondary | ICD-10-CM | POA: Diagnosis not present

## 2024-10-24 DIAGNOSIS — Z86711 Personal history of pulmonary embolism: Secondary | ICD-10-CM | POA: Diagnosis not present

## 2024-10-24 DIAGNOSIS — R110 Nausea: Secondary | ICD-10-CM | POA: Diagnosis not present

## 2024-10-24 DIAGNOSIS — Z5112 Encounter for antineoplastic immunotherapy: Secondary | ICD-10-CM | POA: Diagnosis not present

## 2024-10-24 DIAGNOSIS — M549 Dorsalgia, unspecified: Secondary | ICD-10-CM | POA: Diagnosis not present

## 2024-10-24 DIAGNOSIS — E1122 Type 2 diabetes mellitus with diabetic chronic kidney disease: Secondary | ICD-10-CM | POA: Diagnosis not present

## 2024-10-24 DIAGNOSIS — I130 Hypertensive heart and chronic kidney disease with heart failure and stage 1 through stage 4 chronic kidney disease, or unspecified chronic kidney disease: Secondary | ICD-10-CM | POA: Diagnosis not present

## 2024-10-24 DIAGNOSIS — I2510 Atherosclerotic heart disease of native coronary artery without angina pectoris: Secondary | ICD-10-CM | POA: Diagnosis not present

## 2024-10-24 LAB — CBC WITH DIFFERENTIAL (CANCER CENTER ONLY)
Abs Immature Granulocytes: 0.02 K/uL (ref 0.00–0.07)
Basophils Absolute: 0 K/uL (ref 0.0–0.1)
Basophils Relative: 0 %
Eosinophils Absolute: 0.4 K/uL (ref 0.0–0.5)
Eosinophils Relative: 6 %
HCT: 37.6 % — ABNORMAL LOW (ref 39.0–52.0)
Hemoglobin: 11.4 g/dL — ABNORMAL LOW (ref 13.0–17.0)
Immature Granulocytes: 0 %
Lymphocytes Relative: 24 %
Lymphs Abs: 1.6 K/uL (ref 0.7–4.0)
MCH: 24.6 pg — ABNORMAL LOW (ref 26.0–34.0)
MCHC: 30.3 g/dL (ref 30.0–36.0)
MCV: 81 fL (ref 80.0–100.0)
Monocytes Absolute: 1.2 K/uL — ABNORMAL HIGH (ref 0.1–1.0)
Monocytes Relative: 18 %
Neutro Abs: 3.5 K/uL (ref 1.7–7.7)
Neutrophils Relative %: 52 %
Platelet Count: 148 K/uL — ABNORMAL LOW (ref 150–400)
RBC: 4.64 MIL/uL (ref 4.22–5.81)
RDW: 17.2 % — ABNORMAL HIGH (ref 11.5–15.5)
WBC Count: 6.8 K/uL (ref 4.0–10.5)
nRBC: 0 % (ref 0.0–0.2)

## 2024-10-24 LAB — CMP (CANCER CENTER ONLY)
ALT: 14 U/L (ref 0–44)
AST: 25 U/L (ref 15–41)
Albumin: 3.7 g/dL (ref 3.5–5.0)
Alkaline Phosphatase: 49 U/L (ref 38–126)
Anion gap: 7 (ref 5–15)
BUN: 16 mg/dL (ref 8–23)
CO2: 27 mmol/L (ref 22–32)
Calcium: 9.2 mg/dL (ref 8.9–10.3)
Chloride: 102 mmol/L (ref 98–111)
Creatinine: 1.33 mg/dL — ABNORMAL HIGH (ref 0.61–1.24)
GFR, Estimated: 53 mL/min — ABNORMAL LOW (ref >60)
Glucose, Bld: 117 mg/dL — ABNORMAL HIGH (ref 70–99)
Potassium: 4.4 mmol/L (ref 3.5–5.1)
Sodium: 137 mmol/L (ref 135–145)
Total Bilirubin: 0.5 mg/dL (ref 0.0–1.2)
Total Protein: 7.2 g/dL (ref 6.5–8.1)

## 2024-10-24 MED ORDER — SODIUM CHLORIDE 0.9 % IV SOLN
240.0000 mg | Freq: Once | INTRAVENOUS | Status: AC
  Administered 2024-10-24: 240 mg via INTRAVENOUS
  Filled 2024-10-24: qty 24

## 2024-10-24 MED ORDER — NYSTATIN 100000 UNIT/ML MT SUSP
5.0000 mL | Freq: Two times a day (BID) | OROMUCOSAL | 0 refills | Status: AC
  Filled 2024-10-24 (×2): qty 140, 14d supply, fill #0

## 2024-10-24 MED ORDER — SODIUM CHLORIDE 0.9 % IV SOLN: INTRAVENOUS | Status: DC

## 2024-10-24 NOTE — Progress Notes (Signed)
 Patient seen by Dr. Arley Hof today  Vitals are within treatment parameters:Yes   Labs are within treatment parameters: Yes   Treatment plan has been signed: Yes   Per physician team, Patient is ready for treatment and there are NO modifications to the treatment plan.

## 2024-10-24 NOTE — Patient Instructions (Signed)

## 2024-10-24 NOTE — Progress Notes (Signed)
  [facility] OFFICE PROGRESS NOTE   Diagnosis: Gastric cancer  INTERVAL HISTORY:   Mr. Pannone returns as scheduled.  He completed treatment with nivolumab  10/13/2024.  He feels well.  No dysphagia.  No bleeding.  The stool is dark while taking iron .  He reports chronic dryness of the skin.  Good appetite.  No new complaint.  Objective:  Vital signs in last 24 hours:  Blood pressure 116/64, pulse 63, temperature 97.8 F (36.6 C), temperature source Temporal, resp. rate 18, height 6' 2 (1.88 m), weight 299 lb 6.4 oz (135.8 kg), SpO2 93%.    HEENT: Thrush at the palate and buccal mucosa Resp: Distant breath sounds, no respiratory distress Cardio: Regular rate and rhythm GI: No hepatosplenomegaly, no mass, nontender Vascular: The right lower leg is larger than the left side with trace edema  Skin: Diffuse dryness over the trunk and arms  Portacath/PICC-without erythema  Lab Results:  Lab Results  Component Value Date   WBC 6.8 10/24/2024   HGB 11.4 (L) 10/24/2024   HCT 37.6 (L) 10/24/2024   MCV 81.0 10/24/2024   PLT 148 (L) 10/24/2024   NEUTROABS 3.5 10/24/2024    CMP  Lab Results  Component Value Date   NA 137 10/24/2024   K 4.4 10/24/2024   CL 102 10/24/2024   CO2 27 10/24/2024   GLUCOSE 117 (H) 10/24/2024   BUN 16 10/24/2024   CREATININE 1.33 (H) 10/24/2024   CALCIUM 9.2 10/24/2024   PROT 7.2 10/24/2024   ALBUMIN  3.7 10/24/2024   AST 25 10/24/2024   ALT 14 10/24/2024   ALKPHOS 49 10/24/2024   BILITOT 0.5 10/24/2024   GFRNONAA 53 (L) 10/24/2024   GFRAA 57 (L) 11/09/2020    Lab Results  Component Value Date   CEA <1.00 07/12/2024    Medications: I have reviewed the patient's current medications.   Assessment/Plan: Gastric cancer Upper endoscopy 07/01/2024-large fungating, ulcerated, noncircumferential mass on the lesser curvature of the stomach.  Pathology-invasive moderately differentiated adenocarcinoma; HER2 negative, loss of MLH1  and PMS2, Claudin 18-negative, PD-L1 CPS-50, MLH1 methylation detected, BRAF-negative; foundation 1-MSI high, tumor mutation burden 41 CTs 07/06/2024-enhancing lesser curvature wall thickening/mass; mesenteric root fat stranding and multiple retroperitoneal/periaortic and celiac lymph nodes; new right upper lobe upper lobe pulmonary nodule; multiple prominent mediastinal lymph nodes 07/29/2024 PET scan-hypermetabolic gastric mass.  No evidence of metastatic disease. Cycle 1 ipilimumab /nivolumab  08/17/2024, day 15 nivolumab  08/31/2024, day 29 nivolumab  09/14/2024 Cycle 2 ipilimumab /nivolumab  09/29/2024, day 15 nivolumab  10/13/2024, day 29 nivolumab  09/23/2024      Iron  deficiency anemia  Oral iron  initiated Hemoglobin improved 08/17/2024 CAD/CHF Diabetes Hypertension CKD Obstructive sleep apnea History of pulmonary embolus August 2022-previously on apixaban .  Apixaban  stopped due to bleeding. Ascending aortic aneurysm Admission 07/14/2024 with pneumonia and respiratory failure    Disposition: Mr. Egolf has a history of gastric cancer.  He is tolerating the ipilimumab /nivolumab  well.  He will complete day 29 cycle 3 today.  Iron  deficiency anemia has improved.  He will undergo a restaging CT evaluation after cycle 3-day 1 we will consider referring him for a repeat upper endoscopy if the CT is not informative.  Mr. Brill will return for an office visit and ipilimumab /nivolumab  in 2 weeks.  Arley Hof, MD  10/24/2024  8:47 AM

## 2024-10-24 NOTE — Patient Instructions (Signed)
 [facility] - A DEPT OF [facility]  Discharge Instructions: Thank you for choosing [facility] to provide your oncology and hematology care.   If you have a lab appointment with the Cancer Center, please go directly to the Cancer Center and check in at the registration area.   Wear comfortable clothing and clothing appropriate for easy access to any Portacath or PICC line.   We strive to give you quality time with your provider. You may need to reschedule your appointment if you arrive late (15 or more minutes).  Arriving late affects you and other patients whose appointments are after yours.  Also, if you miss three or more appointments without notifying the office, you may be dismissed from the clinic at the provider's discretion.      For prescription refill requests, have your pharmacy contact our office and allow 72 hours for refills to be completed.    Today you received the following chemotherapy and/or immunotherapy agents: opdivo       To help prevent nausea and vomiting after your treatment, we encourage you to take your nausea medication as directed.  BELOW ARE SYMPTOMS THAT SHOULD BE REPORTED IMMEDIATELY: *FEVER GREATER THAN 100.4 F (38 C) OR HIGHER *CHILLS OR SWEATING *NAUSEA AND VOMITING THAT IS NOT CONTROLLED WITH YOUR NAUSEA MEDICATION *UNUSUAL SHORTNESS OF BREATH *UNUSUAL BRUISING OR BLEEDING *URINARY PROBLEMS (pain or burning when urinating, or frequent urination) *BOWEL PROBLEMS (unusual diarrhea, constipation, pain near the anus) TENDERNESS IN MOUTH AND THROAT WITH OR WITHOUT PRESENCE OF ULCERS (sore throat, sores in mouth, or a toothache) UNUSUAL RASH, SWELLING OR PAIN  UNUSUAL VAGINAL DISCHARGE OR ITCHING   Items with * indicate a potential emergency and should be followed up as soon as possible or go to the Emergency Department if any problems should occur.  Please show the CHEMOTHERAPY ALERT CARD or IMMUNOTHERAPY  ALERT CARD at check-in to the Emergency Department and triage nurse.  Should you have questions after your visit or need to cancel or reschedule your appointment, please contact [facility] - A DEPT OF [facility]  Dept: [phone]  and follow the prompts.  Office hours are 8:00 a.m. to 4:30 p.m. Monday - Friday. Please note that voicemails left after 4:00 p.m. may not be returned until the following business day.  We are closed weekends and major holidays. You have access to a nurse at all times for urgent questions. Please call the main number to the clinic Dept: [phone] and follow the prompts.   For any non-urgent questions, you may also contact your provider using MyChart. We now offer e-Visits for anyone 87 and older to request care online for non-urgent symptoms. For details visit [email or web address].   Also download the MyChart app! Go to the app store, search MyChart, open the app, select [place], and log in with your MyChart username and password.

## 2024-10-25 ENCOUNTER — Inpatient Hospital Stay

## 2024-11-01 DIAGNOSIS — H353221 Exudative age-related macular degeneration, left eye, with active choroidal neovascularization: Secondary | ICD-10-CM | POA: Diagnosis not present

## 2024-11-01 DIAGNOSIS — H35373 Puckering of macula, bilateral: Secondary | ICD-10-CM | POA: Diagnosis not present

## 2024-11-03 ENCOUNTER — Encounter: Payer: Self-pay | Admitting: Nurse Practitioner

## 2024-11-03 ENCOUNTER — Other Ambulatory Visit (HOSPITAL_BASED_OUTPATIENT_CLINIC_OR_DEPARTMENT_OTHER): Payer: Self-pay

## 2024-11-03 ENCOUNTER — Inpatient Hospital Stay: Attending: Oncology | Admitting: Nurse Practitioner

## 2024-11-03 ENCOUNTER — Telehealth: Payer: Self-pay | Admitting: *Deleted

## 2024-11-03 VITALS — BP 156/78 | HR 60 | Temp 97.7°F | Resp 18 | Ht 74.0 in | Wt 297.1 lb

## 2024-11-03 DIAGNOSIS — L299 Pruritus, unspecified: Secondary | ICD-10-CM | POA: Insufficient documentation

## 2024-11-03 DIAGNOSIS — Z7901 Long term (current) use of anticoagulants: Secondary | ICD-10-CM | POA: Insufficient documentation

## 2024-11-03 DIAGNOSIS — R21 Rash and other nonspecific skin eruption: Secondary | ICD-10-CM | POA: Diagnosis not present

## 2024-11-03 DIAGNOSIS — E1122 Type 2 diabetes mellitus with diabetic chronic kidney disease: Secondary | ICD-10-CM | POA: Insufficient documentation

## 2024-11-03 DIAGNOSIS — M25521 Pain in right elbow: Secondary | ICD-10-CM | POA: Insufficient documentation

## 2024-11-03 DIAGNOSIS — I509 Heart failure, unspecified: Secondary | ICD-10-CM | POA: Insufficient documentation

## 2024-11-03 DIAGNOSIS — C169 Malignant neoplasm of stomach, unspecified: Secondary | ICD-10-CM | POA: Insufficient documentation

## 2024-11-03 DIAGNOSIS — M25512 Pain in left shoulder: Secondary | ICD-10-CM | POA: Insufficient documentation

## 2024-11-03 DIAGNOSIS — I2510 Atherosclerotic heart disease of native coronary artery without angina pectoris: Secondary | ICD-10-CM | POA: Insufficient documentation

## 2024-11-03 DIAGNOSIS — I7121 Aneurysm of the ascending aorta, without rupture: Secondary | ICD-10-CM | POA: Diagnosis not present

## 2024-11-03 DIAGNOSIS — M25511 Pain in right shoulder: Secondary | ICD-10-CM | POA: Diagnosis not present

## 2024-11-03 DIAGNOSIS — M25522 Pain in left elbow: Secondary | ICD-10-CM | POA: Diagnosis not present

## 2024-11-03 DIAGNOSIS — D509 Iron deficiency anemia, unspecified: Secondary | ICD-10-CM | POA: Insufficient documentation

## 2024-11-03 DIAGNOSIS — N189 Chronic kidney disease, unspecified: Secondary | ICD-10-CM | POA: Diagnosis not present

## 2024-11-03 DIAGNOSIS — I130 Hypertensive heart and chronic kidney disease with heart failure and stage 1 through stage 4 chronic kidney disease, or unspecified chronic kidney disease: Secondary | ICD-10-CM | POA: Insufficient documentation

## 2024-11-03 DIAGNOSIS — E119 Type 2 diabetes mellitus without complications: Secondary | ICD-10-CM | POA: Insufficient documentation

## 2024-11-03 DIAGNOSIS — G4730 Sleep apnea, unspecified: Secondary | ICD-10-CM | POA: Diagnosis not present

## 2024-11-03 DIAGNOSIS — Z5112 Encounter for antineoplastic immunotherapy: Secondary | ICD-10-CM | POA: Insufficient documentation

## 2024-11-03 DIAGNOSIS — Z86711 Personal history of pulmonary embolism: Secondary | ICD-10-CM | POA: Diagnosis not present

## 2024-11-03 MED ORDER — DOXYCYCLINE HYCLATE 100 MG PO TABS
100.0000 mg | ORAL_TABLET | Freq: Two times a day (BID) | ORAL | 0 refills | Status: AC
  Filled 2024-11-03: qty 14, 7d supply, fill #0

## 2024-11-03 MED ORDER — TRIAMCINOLONE ACETONIDE 0.5 % EX OINT
1.0000 | TOPICAL_OINTMENT | Freq: Two times a day (BID) | CUTANEOUS | 0 refills | Status: AC
  Filled 2024-11-03: qty 30, 15d supply, fill #0

## 2024-11-03 NOTE — Telephone Encounter (Signed)
 Called Mr. Shedd to come in today at 3:15 to see NP. He agrees.

## 2024-11-03 NOTE — Telephone Encounter (Signed)
 Wife and patient called reporting the following issues: Has developed pruritic dry rash on his back and lateral torso area (bilateral). Started early in week and getting worse. Using anti-itch lotion and Benadryl  50 mg at night. Has developed joint pain in both shoulders to point he can no longer put on his shirt without assistance. Lifting arm causes pain. Started last week and is progressive. Is this related to his treatment? Should they see ortho? Has large scratch on his anterior left calf from ankle area up to knee and not sure how it happened. Not bleeding and looks clean. This RN suggested he clean daily and apply neosporin  ointment.

## 2024-11-03 NOTE — Progress Notes (Addendum)
 [facility] OFFICE PROGRESS NOTE   Diagnosis: Gastric cancer  INTERVAL HISTORY:   Mr. Buchheit returns prior to scheduled follow-up.  He completed a cycle of nivolumab  10/13/2024.  He contacted the office this morning to report a pruritic dry rash on his trunk and bilateral shoulder joint pain.  He reports having a mild dry skin rash at the left upper outer chest at the time of his last visit.  He now notes the rash across the chest and on his arms.  Associated pruritus.  He notes a scratch at the left lower leg but is unsure how this occurred.  Last week he had left shoulder pain.  This week bilateral shoulder pain.  The right shoulder is mainly affected.  Range of motion is decreased due to pain.  No wrist/finger/hand joint pain.  He again notes increased swelling of the right leg.  This has been occurring intermittently since August.  The skin at the right lower leg is sore and he notes a red area.  No fever or chills.  Objective:  Vital signs in last 24 hours:  Blood pressure (!) 156/78, pulse 60, temperature 97.7 F (36.5 C), temperature source Temporal, resp. rate 18, height 6' 2 (1.88 m), weight 297 lb 1.6 oz (134.8 kg), SpO2 98%.    HEENT: No thrush or ulcers. Resp: Distant breath sounds.  No respiratory distress. Cardio: Regular rate and rhythm. GI: No hepatosplenomegaly.  Nontender. Vascular: Right lower leg with pitting edema.  Skin at the right lower leg is tender.  Erythematous area at the right lower leg. Skin: Dry appearing skin rash across the anterior chest, left and right upper arms and left hand.  Skin hyperpigmentation at the upper back.  Superficial abrasion below the right knee extending to the lower leg. Musculoskeletal: Decreased range of motion with bilateral shoulder adduction. Port-A-Cath without erythema.  Lab Results:  Lab Results  Component Value Date   WBC 6.8 10/24/2024   HGB 11.4 (L) 10/24/2024   HCT 37.6 (L) 10/24/2024   MCV 81.0  10/24/2024   PLT 148 (L) 10/24/2024   NEUTROABS 3.5 10/24/2024    Imaging:  No results found.  Medications: I have reviewed the patient's current medications.  Assessment/Plan: Gastric cancer Upper endoscopy 07/01/2024-large fungating, ulcerated, noncircumferential mass on the lesser curvature of the stomach.  Pathology-invasive moderately differentiated adenocarcinoma; HER2 negative, loss of MLH1 and PMS2, Claudin 18-negative, PD-L1 CPS-50, MLH1 methylation detected, BRAF-negative; foundation 1-MSI high, tumor mutation burden 41 CTs 07/06/2024-enhancing lesser curvature wall thickening/mass; mesenteric root fat stranding and multiple retroperitoneal/periaortic and celiac lymph nodes; new right upper lobe upper lobe pulmonary nodule; multiple prominent mediastinal lymph nodes 07/29/2024 PET scan-hypermetabolic gastric mass.  No evidence of metastatic disease. Cycle 1 ipilimumab /nivolumab  08/17/2024, day 15 nivolumab  08/31/2024, day 29 nivolumab  09/14/2024 Cycle 2 ipilimumab /nivolumab  09/29/2024, day 15 nivolumab  10/13/2024, day 29 nivolumab  10/24/2024   Iron  deficiency anemia  Oral iron  initiated Hemoglobin improved 08/17/2024 CAD/CHF Diabetes Hypertension CKD Obstructive sleep apnea History of pulmonary embolus August 2022-previously on apixaban .  Apixaban  stopped due to bleeding. Ascending aortic aneurysm Admission 07/14/2024 with pneumonia and respiratory failure  Disposition: Mr. Bradley Alvarez appears stable.  He has a progressive dry appearing pruritic skin rash.  He will begin hydrocortisone cream and triamcinolone  topical cream.  He will contact the office if the rash progresses.  He may have early cellulitis at the right lower leg.  He will begin a course of doxycycline .  He is having shoulder pain with adduction.  Question adhesive  capsulitis.  We will refer to orthopedics if the pain persists.  He will return for follow-up as scheduled 11/08/2024.  We are available to see him sooner if  needed.  Patient seen with Dr. Cloretta.    [name] ANP/GNP-BC   11/03/2024  3:58 PM This was a shared visit with [name].  Mr. Spielmann was interviewed and examined.  He presents for an unscheduled visit due to bilateral shoulder pain and limited range of motion.  We have a low clinical suspicion for acute inflammatory arthritis.  He may have adhesive capsulitis.  We will make an orthopedic referral if the shoulder pain persist.  He has a dry lichenoid type rash over the trunk and arms.  He will begin a course of topical steroids.  He will call if the rash and pruritus do not improve.  He will begin doxycycline  for the erythema/tenderness at the right lower leg.  Arvella Cloretta, MD

## 2024-11-08 ENCOUNTER — Inpatient Hospital Stay

## 2024-11-08 ENCOUNTER — Encounter: Payer: Self-pay | Admitting: Nurse Practitioner

## 2024-11-08 ENCOUNTER — Inpatient Hospital Stay: Admitting: Nurse Practitioner

## 2024-11-08 VITALS — BP 119/77 | HR 55 | Temp 97.2°F | Resp 17 | Wt 293.9 lb

## 2024-11-08 VITALS — BP 135/64 | HR 55 | Temp 97.4°F | Resp 18

## 2024-11-08 DIAGNOSIS — C169 Malignant neoplasm of stomach, unspecified: Secondary | ICD-10-CM

## 2024-11-08 DIAGNOSIS — Z5112 Encounter for antineoplastic immunotherapy: Secondary | ICD-10-CM | POA: Diagnosis not present

## 2024-11-08 LAB — CMP (CANCER CENTER ONLY)
ALT: 15 U/L (ref 0–44)
AST: 25 U/L (ref 15–41)
Albumin: 3.8 g/dL (ref 3.5–5.0)
Alkaline Phosphatase: 50 U/L (ref 38–126)
Anion gap: 7 (ref 5–15)
BUN: 13 mg/dL (ref 8–23)
CO2: 29 mmol/L (ref 22–32)
Calcium: 9.5 mg/dL (ref 8.9–10.3)
Chloride: 100 mmol/L (ref 98–111)
Creatinine: 1.42 mg/dL — ABNORMAL HIGH (ref 0.61–1.24)
GFR, Estimated: 49 mL/min — ABNORMAL LOW (ref >60)
Glucose, Bld: 105 mg/dL — ABNORMAL HIGH (ref 70–99)
Potassium: 4.6 mmol/L (ref 3.5–5.1)
Sodium: 135 mmol/L (ref 135–145)
Total Bilirubin: 0.5 mg/dL (ref 0.0–1.2)
Total Protein: 7.6 g/dL (ref 6.5–8.1)

## 2024-11-08 LAB — CBC WITH DIFFERENTIAL (CANCER CENTER ONLY)
Abs Immature Granulocytes: 0.01 K/uL (ref 0.00–0.07)
Basophils Absolute: 0 K/uL (ref 0.0–0.1)
Basophils Relative: 1 %
Eosinophils Absolute: 0.3 K/uL (ref 0.0–0.5)
Eosinophils Relative: 5 %
HCT: 38.3 % — ABNORMAL LOW (ref 39.0–52.0)
Hemoglobin: 12.1 g/dL — ABNORMAL LOW (ref 13.0–17.0)
Immature Granulocytes: 0 %
Lymphocytes Relative: 25 %
Lymphs Abs: 1.5 K/uL (ref 0.7–4.0)
MCH: 25.2 pg — ABNORMAL LOW (ref 26.0–34.0)
MCHC: 31.6 g/dL (ref 30.0–36.0)
MCV: 79.6 fL — ABNORMAL LOW (ref 80.0–100.0)
Monocytes Absolute: 1.2 K/uL — ABNORMAL HIGH (ref 0.1–1.0)
Monocytes Relative: 19 %
Neutro Abs: 3.2 K/uL (ref 1.7–7.7)
Neutrophils Relative %: 50 %
Platelet Count: 163 K/uL (ref 150–400)
RBC: 4.81 MIL/uL (ref 4.22–5.81)
RDW: 16.6 % — ABNORMAL HIGH (ref 11.5–15.5)
WBC Count: 6.3 K/uL (ref 4.0–10.5)
nRBC: 0 % (ref 0.0–0.2)

## 2024-11-08 LAB — TSH: TSH: 4.66 u[IU]/mL — ABNORMAL HIGH (ref 0.350–4.500)

## 2024-11-08 MED ORDER — DIPHENHYDRAMINE HCL 50 MG/ML IJ SOLN
25.0000 mg | Freq: Once | INTRAMUSCULAR | Status: AC
  Administered 2024-11-08: 25 mg via INTRAVENOUS
  Filled 2024-11-08: qty 1

## 2024-11-08 MED ORDER — FAMOTIDINE IN NACL 20-0.9 MG/50ML-% IV SOLN
20.0000 mg | Freq: Once | INTRAVENOUS | Status: AC
  Administered 2024-11-08: 20 mg via INTRAVENOUS
  Filled 2024-11-08: qty 50

## 2024-11-08 MED ORDER — SODIUM CHLORIDE 0.9 % IV SOLN
240.0000 mg | Freq: Once | INTRAVENOUS | Status: AC
  Administered 2024-11-08: 240 mg via INTRAVENOUS
  Filled 2024-11-08: qty 24

## 2024-11-08 MED ORDER — SODIUM CHLORIDE 0.9 % IV SOLN: INTRAVENOUS | Status: DC

## 2024-11-08 MED ORDER — SODIUM CHLORIDE 0.9 % IV SOLN
1.0000 mg/kg | Freq: Once | INTRAVENOUS | Status: AC
  Administered 2024-11-08: 150 mg via INTRAVENOUS
  Filled 2024-11-08: qty 30

## 2024-11-08 NOTE — Progress Notes (Signed)
 Ipilimumab  (YERVOY ) Patient Monitoring Assessment   Is the patient experiencing any of the following general symptoms?:  [ ] Difficulty performing normal activities [ ] Feeling sluggish or cold all the time [ ] Unusual weight gain [ ] Constant or unusual headaches [ ] Feeling dizzy or faint [ ] Changes in eyesight (blurry vision, double vision, or other vision problems) [ x]Changes in mood or behavior irritability [x ]Starting new medications steroid cream antibiotic [ ] Patient is not experiencing any of the general symptoms above.   Gastrointestinal  Patient is having 1 bowel movements each day.  Is this different from baseline? [ ] Yes [ x]No Are your stools watery or do they have a foul smell? [ ] Yes [ x]No Have you seen blood in your stools? [ ] Yes [x ]No Are your stools dark, tarry, or sticky? [ ] Yes [x ]No Are you having pain or tenderness in your belly? [ ] Yes [x ]No  Skin Does your skin itch? [x ]Yes [ ] No Do you have a rash? [ ] Yes [ x]No Has your skin blistered and/or peeled? [ ] Yes [x ]No Do you have sores in your mouth? [ ] Yes [ x]No  Hepatic Has your urine been dark or tea colored? [ ] Yes [x ]No Have you noticed that your skin or the whites of your eyes are turning yellow? [ ] Yes [ x]No Are you bleeding or bruising more easily than normal? [ ] Yes [ x]No Are you nauseous and/or vomiting? [ ] Yes [x ]No Do you have pain on the right side of your stomach? [ ] Yes [ x]No  Neurologic  Are you having unusual weakness of legs, arms, or face? [ ] Yes [ x]No Are you having numbness or tingling in your hands or feet? [x ]Yes [ ] No  Bradley Alvarez

## 2024-11-08 NOTE — Progress Notes (Signed)
 Bradley Alvarez OFFICE PROGRESS NOTE   Diagnosis: Gastric cancer  INTERVAL HISTORY:   Bradley Alvarez returns as scheduled.  He completed a cycle of nivolumab  10/24/2024.  He was seen in an unscheduled visit 11/03/2024 to evaluate a rash and bilateral shoulder pain.  Topical steroids were prescribed.  We were concern for early cellulitis at the right lower leg.  He was started on a course of doxycycline .  We discussed a referral to orthopedics if shoulder pain persists.  He reports the dry skin rash and pruritus have improved.  He is applying triamcinolone  cream.  The increased swelling, area of tenderness/erythema of the right lower leg is better.  He continues doxycycline .  No diarrhea.  He continues to have shoulder pain with adduction.  Now also having bilateral elbow pain.  Objective:  Vital signs in last 24 hours:  Blood pressure 119/77, pulse (!) 55, temperature (!) 97.2 F (36.2 C), temperature source Temporal, resp. rate 17, weight 293 lb 14.4 oz (133.3 kg), SpO2 94%.    HEENT: No thrush or ulcers. Resp: Distant breath sounds.  No respiratory distress. Cardio: Regular rate and rhythm. GI: No hepatosplenomegaly.  Nontender. Vascular: Trace edema right lower leg.  Previous focal area of erythema at the right lower leg is better.  Mild associated tenderness. Musculoskeletal: Continued decreased range of motion with bilateral shoulder adduction.  Shoulder and elbow joints do not appear swollen or erythematous. Skin: Mild skin hyperpigmentation at the upper back.  Dry appearing skin rash at the upper arms, left hand and anterior chest has improved. Port-A-Cath without erythema.  Lab Results:  Lab Results  Component Value Date   WBC 6.3 11/08/2024   HGB 12.1 (L) 11/08/2024   HCT 38.3 (L) 11/08/2024   MCV 79.6 (L) 11/08/2024   PLT 163 11/08/2024   NEUTROABS 3.2 11/08/2024    Imaging:  No results found.  Medications: I have reviewed the patient's current  medications.  Assessment/Plan: Gastric cancer Upper endoscopy 07/01/2024-large fungating, ulcerated, noncircumferential mass on the lesser curvature of the stomach.  Pathology-invasive moderately differentiated adenocarcinoma; HER2 negative, loss of MLH1 and PMS2, Claudin 18-negative, PD-L1 CPS-50, MLH1 methylation detected, BRAF-negative; foundation 1-MSI high, tumor mutation burden 41 CTs 07/06/2024-enhancing lesser curvature wall thickening/mass; mesenteric root fat stranding and multiple retroperitoneal/periaortic and celiac lymph nodes; new right upper lobe upper lobe pulmonary nodule; multiple prominent mediastinal lymph nodes 07/29/2024 PET scan-hypermetabolic gastric mass.  No evidence of metastatic disease. Cycle 1 ipilimumab /nivolumab  08/17/2024, day 15 nivolumab  08/31/2024, day 29 nivolumab  09/14/2024 Cycle 2 ipilimumab /nivolumab  09/29/2024, day 15 nivolumab  10/13/2024, day 29 nivolumab  10/24/2024 Cycle 3 ipilimumab /nivolumab  11/08/2024   Iron  deficiency anemia  Oral iron  initiated Hemoglobin improved 08/17/2024 CAD/CHF Diabetes Hypertension CKD Obstructive sleep apnea History of pulmonary embolus August 2022-previously on apixaban .  Apixaban  stopped due to bleeding. Ascending aortic aneurysm Admission 07/14/2024 with pneumonia and respiratory failure  Disposition: Bradley Alvarez appears stable.  He has completed 2 cycles of ipilimumab /nivolumab .  Overall tolerating treatment well.  Dry skin rash and shoulder pain not definitely related to treatment but he understands it is possible.  Plan to proceed with cycle 3 ipilimumab /nivolumab  today as scheduled.  He will contact the office if symptoms worsen, for now continue supportive measures.  Restaging CTs in a few weeks.  CBC and chemistry panel reviewed.  Labs are adequate for treatment.  He will return for follow-up and nivolumab  in 2 weeks.  We are available to see him sooner if needed.  Patient seen with Dr. Cloretta.  Bradley Alvarez  ANP/GNP-BC   11/08/2024  8:48 AM  This was a shared visit with Bradley Alvarez.  Bradley Alvarez was interviewed and examined.  The skin dryness and rash appear improved compared to when we saw him last week.  The cellulitis of the right lower leg has improved.  The etiology of the shoulder discomfort is unclear.  I have a low clinical suspicion for inflammatory arthritis related to immunotherapy, but this is possible.  He will complete cycle 3 ipilimumab /nivolumab  beginning today.  He will undergo a restaging CT evaluation within the next few weeks.  Bradley Hof, MD

## 2024-11-08 NOTE — Patient Instructions (Signed)
 [facility] - A DEPT OF [facility]  Discharge Instructions: Thank you for choosing [facility] to provide your oncology and hematology care.   If you have a lab appointment with the Cancer Center, please go directly to the Cancer Center and check in at the registration area.   Wear comfortable clothing and clothing appropriate for easy access to any Portacath or PICC line.   We strive to give you quality time with your provider. You may need to reschedule your appointment if you arrive late (15 or more minutes).  Arriving late affects you and other patients whose appointments are after yours.  Also, if you miss three or more appointments without notifying the office, you may be dismissed from the clinic at the provider's discretion.      For prescription refill requests, have your pharmacy contact our office and allow 72 hours for refills to be completed.    Today you received the following chemotherapy and/or immunotherapy agents: nivolumab   (Opdivo ), ipilimumab  (yervoy )  Ipilimumab  Injection What is this medication? IPILIMUMAB  (IP i LIM ue mab) treats some types of cancer. It works by helping your immune system slow or stop the spread of cancer cells. It is a monoclonal antibody. This medicine may be used for other purposes; ask your health care provider or pharmacist if you have questions. COMMON BRAND NAME(S): YERVOY  What should I tell my care team before I take this medication? They need to know if you have any of these conditions: Allogeneic stem cell transplant (uses someone else's stem cells) Autoimmune diseases, such as Crohn disease, ulcerative colitis, lupus Nervous system problems, such as Guillain-Barre syndrome or myasthenia gravis Organ transplant An unusual or allergic reaction to ipilimumab , other medications, foods, dyes, or preservatives Pregnant or trying to get pregnant Breast-feeding How should I use this  medication? This medication is infused into a vein. It is given by your care team in a hospital or clinic setting. A special MedGuide will be given to you before each treatment. Be sure to read this information carefully each time. Talk to your care team about the use of this medication in children. While it may be prescribed for children as young as 12 years for selected conditions, precautions do apply. Overdosage: If you think you have taken too much of this medicine contact a poison control center or emergency room at once. NOTE: This medicine is only for you. Do not share this medicine with others. What if I miss a dose? Keep appointments for follow-up doses. It is important not to miss your dose. Call your care team if you are unable to keep an appointment. What may interact with this medication? Interactions are not expected. This list may not describe all possible interactions. Give your health care provider a list of all the medicines, herbs, non-prescription drugs, or dietary supplements you use. Also tell them if you smoke, drink alcohol , or use illegal drugs. Some items may interact with your medicine. What should I watch for while using this medication? Your condition will be monitored carefully while you are receiving this medication. You may need blood work while taking this medication. This medication may cause serious skin reactions. They can happen weeks to months after starting the medication. Contact your care team right away if you notice fevers or flu-like symptoms with a rash. The rash may be red or purple and then turn into blisters or peeling of the skin. You may also notice a red  rash with swelling of the face, lips, or lymph nodes in your neck or under your arms. Tell your care team right away if you have any change in your eyesight. Talk to your care team if you may be pregnant. Serious birth defects can occur if you take this medication during pregnancy and for 3 months  after the last dose. You will need a negative pregnancy test before starting this medication. Contraception is recommended while taking this medication and for 3 months after the last dose. Your care team can help you find the option that works for you. Do not breastfeed while taking this medication and for 3 months after the last dose. What side effects may I notice from receiving this medication? Side effects that you should report to your care team as soon as possible: Allergic reactions--skin rash, itching, hives, swelling of the face, lips, tongue, or throat Dry cough, shortness of breath or trouble breathing Eye pain, redness, irritation, or discharge with blurry or decreased vision Heart muscle inflammation--unusual weakness or fatigue, shortness of breath, chest pain, fast or irregular heartbeat, dizziness, swelling of the ankles, feet, or hands Hormone gland problems--headache, sensitivity to light, unusual weakness or fatigue, dizziness, fast or irregular heartbeat, increased sensitivity to cold or heat, excessive sweating, constipation, hair loss, increased thirst or amount of urine, tremors or shaking, irritability Infusion reactions--chest pain, shortness of breath or trouble breathing, feeling faint or lightheaded Kidney injury (glomerulonephritis)--decrease in the amount of urine, red or dark brown urine, foamy or bubbly urine, swelling of the ankles, hands, or feet Liver injury--right upper belly pain, loss of appetite, nausea, light-colored stool, dark yellow or brown urine, yellowing skin or eyes, unusual weakness or fatigue Pain, tingling, or numbness in the hands or feet, muscle weakness, change in vision, confusion or trouble speaking, loss of balance or coordination, trouble walking, seizures Rash, fever, and swollen lymph nodes Redness, blistering, peeling, or loosening of the skin, including inside the mouth Sudden or severe stomach pain, bloody diarrhea, fever, nausea,  vomiting Side effects that usually do not require medical attention (report to your care team if they continue or are bothersome): Bone, joint, or muscle pain Diarrhea Fatigue Loss of appetite Nausea Skin rash This list may not describe all possible side effects. Call your doctor for medical advice about side effects. You may report side effects to FDA at 1-800-FDA-1088. Where should I keep my medication? This medication is given in a hospital or clinic. It will not be stored at home. NOTE: This sheet is a summary. It may not cover all possible information. If you have questions about this medicine, talk to your doctor, pharmacist, or health care provider.  2024 Elsevier/Gold Standard (2022-04-04 00:00:00)  Nivolumab  Injection What is this medication? NIVOLUMAB  (nye VOL ue mab) treats some types of cancer. It works by helping your immune system slow or stop the spread of cancer cells. It is a monoclonal antibody. This medicine may be used for other purposes; ask your health care provider or pharmacist if you have questions. COMMON BRAND NAME(S): Opdivo  What should I tell my care team before I take this medication? They need to know if you have any of these conditions: Allogeneic stem cell transplant (uses someone else's stem cells) Autoimmune diseases, such as Crohn disease, ulcerative colitis, lupus History of chest radiation Nervous system problems, such as Guillain-Barre syndrome or myasthenia gravis Organ transplant An unusual or allergic reaction to nivolumab , other medications, foods, dyes, or preservatives Pregnant or trying to get pregnant  Breast-feeding How should I use this medication? This medication is infused into a vein. It is given in a hospital or clinic setting. A special MedGuide will be given to you before each treatment. Be sure to read this information carefully each time. Talk to your care team about the use of this medication in children. While it may be  prescribed for children as young as 12 years for selected conditions, precautions do apply. Overdosage: If you think you have taken too much of this medicine contact a poison control center or emergency room at once. NOTE: This medicine is only for you. Do not share this medicine with others. What if I miss a dose? Keep appointments for follow-up doses. It is important not to miss your dose. Call your care team if you are unable to keep an appointment. What may interact with this medication? Interactions have not been studied. This list may not describe all possible interactions. Give your health care provider a list of all the medicines, herbs, non-prescription drugs, or dietary supplements you use. Also tell them if you smoke, drink alcohol , or use illegal drugs. Some items may interact with your medicine. What should I watch for while using this medication? Your condition will be monitored carefully while you are receiving this medication. You may need blood work while taking this medication. This medication may cause serious skin reactions. They can happen weeks to months after starting the medication. Contact your care team right away if you notice fevers or flu-like symptoms with a rash. The rash may be red or purple and then turn into blisters or peeling of the skin. You may also notice a red rash with swelling of the face, lips, or lymph nodes in your neck or under your arms. Tell your care team right away if you have any change in your eyesight. Talk to your care team if you are pregnant or think you might be pregnant. A negative pregnancy test is required before starting this medication. A reliable form of contraception is recommended while taking this medication and for 5 months after the last dose. Talk to your care team about effective forms of contraception. Do not breast-feed while taking this medication and for 5 months after the last dose. What side effects may I notice from receiving  this medication? Side effects that you should report to your care team as soon as possible: Allergic reactions--skin rash, itching, hives, swelling of the face, lips, tongue, or throat Dry cough, shortness of breath or trouble breathing Eye pain, redness, irritation, or discharge with blurry or decreased vision Heart muscle inflammation--unusual weakness or fatigue, shortness of breath, chest pain, fast or irregular heartbeat, dizziness, swelling of the ankles, feet, or hands Hormone gland problems--headache, sensitivity to light, unusual weakness or fatigue, dizziness, fast or irregular heartbeat, increased sensitivity to cold or heat, excessive sweating, constipation, hair loss, increased thirst or amount of urine, tremors or shaking, irritability Infusion reactions--chest pain, shortness of breath or trouble breathing, feeling faint or lightheaded Kidney injury (glomerulonephritis)--decrease in the amount of urine, red or dark brown urine, foamy or bubbly urine, swelling of the ankles, hands, or feet Liver injury--right upper belly pain, loss of appetite, nausea, light-colored stool, dark yellow or brown urine, yellowing skin or eyes, unusual weakness or fatigue Pain, tingling, or numbness in the hands or feet, muscle weakness, change in vision, confusion or trouble speaking, loss of balance or coordination, trouble walking, seizures Rash, fever, and swollen lymph nodes Redness, blistering, peeling, or loosening of  the skin, including inside the mouth Sudden or severe stomach pain, bloody diarrhea, fever, nausea, vomiting Side effects that usually do not require medical attention (report these to your care team if they continue or are bothersome): Bone, joint, or muscle pain Diarrhea Fatigue Loss of appetite Nausea Skin rash This list may not describe all possible side effects. Call your doctor for medical advice about side effects. You may report side effects to FDA at 1-800-FDA-1088. Where  should I keep my medication? This medication is given in a hospital or clinic. It will not be stored at home. NOTE: This sheet is a summary. It may not cover all possible information. If you have questions about this medicine, talk to your doctor, pharmacist, or health care provider.  2024 Elsevier/Gold Standard (2022-03-17 00:00:00)      To help prevent nausea and vomiting after your treatment, we encourage you to take your nausea medication as directed.  BELOW ARE SYMPTOMS THAT SHOULD BE REPORTED IMMEDIATELY: *FEVER GREATER THAN 100.4 F (38 C) OR HIGHER *CHILLS OR SWEATING *NAUSEA AND VOMITING THAT IS NOT CONTROLLED WITH YOUR NAUSEA MEDICATION *UNUSUAL SHORTNESS OF BREATH *UNUSUAL BRUISING OR BLEEDING *URINARY PROBLEMS (pain or burning when urinating, or frequent urination) *BOWEL PROBLEMS (unusual diarrhea, constipation, pain near the anus) TENDERNESS IN MOUTH AND THROAT WITH OR WITHOUT PRESENCE OF ULCERS (sore throat, sores in mouth, or a toothache) UNUSUAL RASH, SWELLING OR PAIN  UNUSUAL VAGINAL DISCHARGE OR ITCHING   Items with * indicate a potential emergency and should be followed up as soon as possible or go to the Emergency Department if any problems should occur.  Please show the CHEMOTHERAPY ALERT CARD or IMMUNOTHERAPY ALERT CARD at check-in to the Emergency Department and triage nurse.  Should you have questions after your visit or need to cancel or reschedule your appointment, please contact [facility]  Dept: [phone]  and follow the prompts.  Office hours are 8:00 a.m. to 4:30 p.m. Monday - Friday. Please note that voicemails left after 4:00 p.m. may not be returned until the following business day.  We are closed weekends and major holidays. You have access to a nurse at all times for urgent questions. Please call the main number to the clinic Dept: [phone] and follow the prompts.   For any non-urgent  questions, you may also contact your provider using MyChart. We now offer e-Visits for anyone 19 and older to request care online for non-urgent symptoms. For details visit [email or web address].   Also download the MyChart app! Go to the app store, search MyChart, open the app, select [facility], and log in with your MyChart username and password.

## 2024-11-08 NOTE — Progress Notes (Signed)
 Patient seen by [name] NP today  Vitals are within treatment parameters:Yes   Labs are within treatment parameters: Yes creatinine 1.4 its okay to proceed   Treatment plan has been signed: Yes   Per physician team, Patient is ready for treatment and there are NO modifications to the treatment plan.

## 2024-11-09 ENCOUNTER — Encounter: Payer: Self-pay | Admitting: Oncology

## 2024-11-09 LAB — T4: T4, Total: 7.5 ug/dL (ref 4.5–12.0)

## 2024-11-15 DIAGNOSIS — G4733 Obstructive sleep apnea (adult) (pediatric): Secondary | ICD-10-CM | POA: Diagnosis not present

## 2024-11-15 DIAGNOSIS — I2699 Other pulmonary embolism without acute cor pulmonale: Secondary | ICD-10-CM | POA: Diagnosis not present

## 2024-11-17 ENCOUNTER — Ambulatory Visit: Admitting: Pulmonary Disease

## 2024-11-17 ENCOUNTER — Ambulatory Visit (HOSPITAL_BASED_OUTPATIENT_CLINIC_OR_DEPARTMENT_OTHER)

## 2024-11-17 ENCOUNTER — Encounter: Payer: Self-pay | Admitting: Pulmonary Disease

## 2024-11-17 VITALS — BP 137/69 | HR 59 | Ht 74.0 in | Wt 290.0 lb

## 2024-11-17 DIAGNOSIS — G4733 Obstructive sleep apnea (adult) (pediatric): Secondary | ICD-10-CM | POA: Diagnosis not present

## 2024-11-17 DIAGNOSIS — J9621 Acute and chronic respiratory failure with hypoxia: Secondary | ICD-10-CM | POA: Diagnosis not present

## 2024-11-17 DIAGNOSIS — J4520 Mild intermittent asthma, uncomplicated: Secondary | ICD-10-CM | POA: Diagnosis not present

## 2024-11-17 NOTE — Progress Notes (Signed)
 @Patient  ID: Bradley Alvarez, male    DOB: May 20, 1941, 83 y.o.   MRN: [id number]  Chief Complaint  Patient presents with   Sleep Apnea    Follow up     Referring provider: [name], MD  HPI: 83 y.o., former smoker quit 1973 (15-pack-year history).  Past medical history significant for obstructive sleep apnea, hypertension, pulmonary embolism, coronary artery disease, 1 diastolic dysfunction, thoracic aortic aneurysm, type 2 diabetes, hyperlipidemia, morbid obesity.  Diagnosed gastric adenocarcinoma summer 2025 in setting of anemia and GI blood loss.  Multiple oncology notes reviewed.  Returns for routine follow-up.  Tolerating cancer treatment relatively well.  Some rash.  Somewhat sounds like rotator cuff tendinopathy bilaterally.  Likely due to inflammatory effects of treatment.  Bradycardia relatively stable.  He stopped Advair because an inhaled steroid to help him take steroids with his immunotherapy.  Notably has been using steroid topical cream for rash that could be related to his treatment.  Discussed that the inhaled steroid will not have a significant effect at all this cancer treatments.  However since his breathing seems no worse, okay to stay off of this.  Albuterol  as needed with improvement when uses it.  Imaging:  Sniff test 10/2023 elevated right hemidiaphragm that does not move consistent with paralysis  CXR 07/15/21 showed no acute findings  CTA 07/13/21 no central obstructing PE. Acute wedge shaped consolidation abutting the pleural of the right lower lobe with surrounding ground glass opacity, suspicious for pulmonary infarction   No Known Allergies  Immunization History  Administered Date(s) Administered   Fluad Trivalent(High Dose 65+) 08/21/2023   Fluzone  Influenza virus vaccine,trivalent (IIV3), split virus 09/02/2010, 08/21/2011, 08/23/2012   INFLUENZA, HIGH DOSE SEASONAL PF 09/18/2022, 09/14/2024   Influenza Split 08/19/2013, 08/21/2014   Influenza, Mdck,  Trivalent,PF 6+ MOS(egg free) 08/07/2016, 08/07/2017   Influenza, Quadrivalent, Recombinant, Inj, Pf 08/24/2018, 07/22/2019, 09/25/2020, 09/09/2021, 09/19/2023, 09/14/2024   Influenza,inj,Quad PF,6+ Mos 08/18/2014, 08/09/2015   PFIZER(Purple Top)SARS-COV-2 Vaccination 12/24/2019, 01/13/2020, 09/02/2020, 05/13/2021, 06/19/2023   PNEUMOCOCCAL CONJUGATE-20 06/19/2022   Pneumococcal Conjugate-13 09/19/2013   Pneumococcal Polysaccharide-23 05/20/2022   Tdap 07/13/2012, 01/06/2022   Zoster, Live 03/07/2014    Past Medical History:  Diagnosis Date   AAA (abdominal aortic aneurysm) without rupture    followed by dr brain--  per office note dated 11-05-2018  3.5cm   Benign essential HTN    cardiologist-- dr tally   Chronic combined systolic (congestive) and diastolic (congestive) heart failure [facility])    cardiologist-- dr katharyn   CKD (chronic kidney disease), stage III (HCC)    Diabetes mellitus type 2, insulin  dependent (HCC)    followed by dr Bradley   Diabetic neuropathy (HCC)    DOE (dyspnea on exertion)    Edema of both lower extremities    Full dentures    HOH (hard of hearing)    does wear hearing aids   Hydrocele, bilateral    Lichen planus    Mixed hyperlipidemia    Morbid obesity with BMI of 45.0-49.9, adult (HCC)    Nocturia    OA (osteoarthritis)    OSA on CPAP 10/14/08   per study  AHI  61.8/hr ,  RDI  96.1/hr.     Tobacco History: Social History   Tobacco Use  Smoking Status Former   Current packs/day: 0.00   Average packs/day: 1.5 packs/day for 10.0 years (15.0 ttl pk-yrs)   Types: Cigarettes   Start date: 12/13/1961   Quit date: 12/14/1971   Years since  quitting: 52.9  Smokeless Tobacco Former   Types: Snuff, Chew   Counseling given: Not Answered   Outpatient Medications Prior to Visit  Medication Sig Dispense Refill   albuterol  (VENTOLIN  HFA) 108 (90 Base) MCG/ACT inhaler Inhale 2 puffs into the lungs every 6 (six) hours as needed for wheezing  or shortness of breath. 1 each 6   BIDIL  20-37.5 MG tablet Take 2 tablets by mouth in the morning and at bedtime.     carvedilol  (COREG ) 25 MG tablet TAKE TWO TABLETS BY MOUTH TWICE DAILY with meals 360 tablet 3   clobetasol  ointment (TEMOVATE ) 0.05 % Apply 1 application  topically daily as needed (for itching- Lichen planus).     felodipine  (PLENDIL ) 10 MG 24 hr tablet Take 10 mg by mouth in the morning.     ferrous sulfate  325 (65 FE) MG tablet Take 325 mg by mouth 2 (two) times daily with a meal.     furosemide  (LASIX ) 40 MG tablet Take 1 tablet (40 mg total) by mouth daily. 30 tablet 1   HYDROcodone -acetaminophen  (NORCO/VICODIN) 5-325 MG tablet Take 1 tablet by mouth 2 (two) times daily as needed (for pain). 15 tablet 0   lidocaine  (LIDODERM ) 5 % Place 1 patch onto the skin daily. Remove & Discard patch within 12 hours or as directed by MD 20 patch 0   lidocaine -prilocaine  (EMLA ) cream Apply 1 Application topically as needed (May use after first treatment). 30 g 0   loratadine (CLARITIN) 10 MG tablet Take 10 mg by mouth every morning.      losartan  (COZAAR ) 100 MG tablet Take 100 mg by mouth daily.     MEGARED OMEGA-3 KRILL OIL PO Take 1 capsule by mouth daily.     methocarbamol  (ROBAXIN ) 500 MG tablet Take 1 tablet (500 mg total) by mouth at bedtime as needed. 30 tablet 1   Multiple Vitamin (MULTIVITAMIN WITH MINERALS) TABS tablet Take 1 tablet by mouth daily with breakfast.     nystatin  (MYCOSTATIN ) 100000 UNIT/ML suspension Take 5 mLs (500,000 Units total) by mouth 2 (two) times daily. Take for 2 weeks 140 mL 0   omeprazole (PRILOSEC) 20 MG capsule Take 20 mg by mouth daily.     ondansetron  (ZOFRAN ) 8 MG tablet Take 1 tablet (8 mg total) by mouth every 8 (eight) hours as needed for nausea or vomiting. 20 tablet 2   ONETOUCH VERIO test strip USE TO TEST BID AS DIRECTED  2   polyethylene glycol (MIRALAX  / GLYCOLAX ) 17 g packet Take 17 g by mouth 2 (two) times daily.     potassium chloride  SA  (KLOR-CON  M20) 20 MEQ tablet Take 1 tablet (20 mEq total) by mouth daily. 30 tablet 1   senna (SENOKOT) 8.6 MG TABS tablet Take 2 tablets (17.2 mg total) by mouth at bedtime.     simvastatin  (ZOCOR ) 40 MG tablet Take 40 mg by mouth every evening.     spironolactone  (ALDACTONE ) 50 MG tablet Take 0.5 tablets (25 mg total) by mouth daily.     SYSTANE 0.4-0.3 % SOLN Place 1 drop into both eyes 3 (three) times daily as needed (for dryness).     triamcinolone  ointment (KENALOG ) 0.5 % Apply 1 Application topically 2 (two) times daily. Apply to affected areas twice a day 30 g 0   TYLENOL  325 MG tablet Take 325-650 mg by mouth every 6 (six) hours as needed for mild pain (pain score 1-3) or headache.     urea (CARMOL) 10 %  cream Apply 1 application  topically as needed (for dryness).     vitamin B-12 (CYANOCOBALAMIN ) 500 MCG tablet Take 500 mcg by mouth daily.     fluticasone -salmeterol (ADVAIR) 250-50 MCG/ACT AEPB Inhale one puff by mouth into the lungs in the morning and at bedtime. 60 each 10   No facility-administered medications prior to visit.      Review of Systems  Review of Systems N/a  Physical Exam  BP 137/69   Pulse (!) 59   Ht 6' 2 (1.88 m)   Wt 290 lb (131.5 kg)   SpO2 90%   BMI 37.23 kg/m  Physical Exam  General: Sitting up in no distress Eyes: No icterus Neck: No JVP sitting upright Pulmonary: Clear bilaterally, normal work of breathing Cardiovascular: Regular rate and rhythm   Lab Results: Personally reviewed CBC    Component Value Date/Time   WBC 6.3 11/08/2024 0816   WBC 7.4 07/26/2024 0856   RBC 4.81 11/08/2024 0816   HGB 12.1 (L) 11/08/2024 0816   HGB 14.1 09/10/2017 1056   HCT 38.3 (L) 11/08/2024 0816   HCT 42.3 09/10/2017 1056   PLT 163 11/08/2024 0816   PLT 141 (L) 09/10/2017 1056   MCV 79.6 (L) 11/08/2024 0816   MCV 81 09/10/2017 1056   MCH 25.2 (L) 11/08/2024 0816   MCHC 31.6 11/08/2024 0816   RDW 16.6 (H) 11/08/2024 0816   RDW 15.1 09/10/2017  1056   LYMPHSABS 1.5 11/08/2024 0816   MONOABS 1.2 (H) 11/08/2024 0816   EOSABS 0.3 11/08/2024 0816   BASOSABS 0.0 11/08/2024 0816    BMET    Component Value Date/Time   NA 135 11/08/2024 0816   NA 137 08/10/2024 1227   K 4.6 11/08/2024 0816   CL 100 11/08/2024 0816   CO2 29 11/08/2024 0816   GLUCOSE 105 (H) 11/08/2024 0816   BUN 13 11/08/2024 0816   BUN 9 08/10/2024 1227   CREATININE 1.42 (H) 11/08/2024 0816   CREATININE 1.14 03/03/2017 1028   CALCIUM 9.5 11/08/2024 0816   GFRNONAA 49 (L) 11/08/2024 0816   GFRAA 57 (L) 11/09/2020 0806    BNP    Component Value Date/Time   BNP 15.9 07/13/2021 1339    ProBNP    Component Value Date/Time   PROBNP 237.0 07/14/2024 1554    Imaging: Personally reviewed   Assessment & Plan:   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   Presumed related to blood clot.  Discussed risk and benefits of ongoing anticoagulation.  Previously recommended lifelong anticoagulation.  However now with concern for bleeding, new anemia, new GI bleed we must reevaluate the risks and benefits.  While the severity of presumed clot was relatively severe and lifelong anticoagulation was recommended in the past, given there is a lack of definitive diagnosis as well as now new bleeding complication, risk of anticoagulation seems too high.  After shared decision making and discussing risk of benefits of continuing and stopping anticoagulation, we agreed to stop anticoagulation moving forward.  Eliquis  removed from medication list 06/24/2024.  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  Goal oxygen  saturation 88%.  Titrate oxygen  therapy at rest and with exertion to maintain this.  Explained in detail today.  OSA on CPAP: continue CPAP, oxygen  bleed in.  He reports good  adherence.  Compliance report reviewed, AHI 0.5.  Using every night  for multiple hours.  He continues to benefit clinically from ongoing CPAP usage.  Asthma: Wheeze in the past improved with prednisone .  Albuterol  helps.  Maintained on Advair currently.  He stopped or self discontinued with concern for taking steroids.  Breathing does seem slightly worsened.  Stay off Advair.  Continue dual bronchodilators in the future.  I counseled him that inhaled steroids and have no effect on his immunotherapy.  We can use in the seasonal future.  Return to clinic in 6 months or sooner as needed with Dr. Annella Bradley Alvarez Annella, MD 11/17/2024

## 2024-11-17 NOTE — Patient Instructions (Signed)
 Nice to see you again  Glad you are doing well  I took the Advair off your medication list okay to stay off this, if your breathing worsens let me know and we can consider a different inhaler  Continue albuterol  as needed  Return to clinic in 6 months or sooner as needed with Dr. Annella

## 2024-11-18 ENCOUNTER — Other Ambulatory Visit: Payer: Self-pay

## 2024-11-22 ENCOUNTER — Inpatient Hospital Stay: Admitting: Oncology

## 2024-11-22 ENCOUNTER — Inpatient Hospital Stay

## 2024-11-22 VITALS — BP 136/75 | HR 54 | Resp 20

## 2024-11-22 VITALS — BP 120/66 | HR 63 | Temp 98.5°F | Resp 18 | Ht 74.0 in | Wt 289.9 lb

## 2024-11-22 DIAGNOSIS — C169 Malignant neoplasm of stomach, unspecified: Secondary | ICD-10-CM

## 2024-11-22 DIAGNOSIS — Z5112 Encounter for antineoplastic immunotherapy: Secondary | ICD-10-CM | POA: Diagnosis not present

## 2024-11-22 LAB — CBC WITH DIFFERENTIAL (CANCER CENTER ONLY)
Abs Immature Granulocytes: 0.01 K/uL (ref 0.00–0.07)
Basophils Absolute: 0 K/uL (ref 0.0–0.1)
Basophils Relative: 0 %
Eosinophils Absolute: 0.5 K/uL (ref 0.0–0.5)
Eosinophils Relative: 7 %
HCT: 39.1 % (ref 39.0–52.0)
Hemoglobin: 12 g/dL — ABNORMAL LOW (ref 13.0–17.0)
Immature Granulocytes: 0 %
Lymphocytes Relative: 23 %
Lymphs Abs: 1.6 K/uL (ref 0.7–4.0)
MCH: 25 pg — ABNORMAL LOW (ref 26.0–34.0)
MCHC: 30.7 g/dL (ref 30.0–36.0)
MCV: 81.5 fL (ref 80.0–100.0)
Monocytes Absolute: 1 K/uL (ref 0.1–1.0)
Monocytes Relative: 15 %
Neutro Abs: 3.7 K/uL (ref 1.7–7.7)
Neutrophils Relative %: 55 %
Platelet Count: 163 K/uL (ref 150–400)
RBC: 4.8 MIL/uL (ref 4.22–5.81)
RDW: 16 % — ABNORMAL HIGH (ref 11.5–15.5)
WBC Count: 6.9 K/uL (ref 4.0–10.5)
nRBC: 0 % (ref 0.0–0.2)

## 2024-11-22 LAB — CMP (CANCER CENTER ONLY)
ALT: 15 U/L (ref 0–44)
AST: 22 U/L (ref 15–41)
Albumin: 3.7 g/dL (ref 3.5–5.0)
Alkaline Phosphatase: 52 U/L (ref 38–126)
Anion gap: 8 (ref 5–15)
BUN: 12 mg/dL (ref 8–23)
CO2: 29 mmol/L (ref 22–32)
Calcium: 9.3 mg/dL (ref 8.9–10.3)
Chloride: 101 mmol/L (ref 98–111)
Creatinine: 1.17 mg/dL (ref 0.61–1.24)
GFR, Estimated: >60 mL/min
Glucose, Bld: 125 mg/dL — ABNORMAL HIGH (ref 70–99)
Potassium: 4.2 mmol/L (ref 3.5–5.1)
Sodium: 138 mmol/L (ref 135–145)
Total Bilirubin: 0.4 mg/dL (ref 0.0–1.2)
Total Protein: 7.2 g/dL (ref 6.5–8.1)

## 2024-11-22 MED ORDER — SODIUM CHLORIDE 0.9 % IV SOLN: INTRAVENOUS | Status: DC

## 2024-11-22 MED ORDER — SODIUM CHLORIDE 0.9 % IV SOLN
240.0000 mg | Freq: Once | INTRAVENOUS | Status: AC
  Administered 2024-11-22: 240 mg via INTRAVENOUS
  Filled 2024-11-22: qty 24

## 2024-11-22 MED ORDER — SODIUM CHLORIDE 0.9% FLUSH
10.0000 mL | INTRAVENOUS | Status: DC | PRN
  Administered 2024-11-22: 10 mL

## 2024-11-22 NOTE — Progress Notes (Signed)
 " Bradley Alvarez OFFICE PROGRESS NOTE   Diagnosis: Gastric cancer  INTERVAL HISTORY:   Bradley Alvarez completed a cycle of ipilimumab /nivolumab  11/08/2024.  The rash has improved.  He continues to have discomfort at the shoulders with abduction.  He has soreness at the elbows and right hip area.  The discomfort is relieved with Tylenol .  Good appetite.  He is trying to lose weight.  No nausea.  Objective:  Vital signs in last 24 hours:  Blood pressure 120/66, pulse 63, temperature 98.5 F (36.9 C), temperature source Temporal, resp. rate 18, height 6' 2 (1.88 m), weight 289 lb 14.4 oz (131.5 kg), SpO2 96%.    HEENT: No thrush or ulcers Resp: Lungs clear bilaterally Cardio: Regular rate and rhythm GI: No hepatomegaly, nontender Vascular: The right lower leg is larger than the left side  Skin: Fading mild erythematous rash over the left greater than right arm Musculoskeletal: Decreased range of motion with abduction at the bilateral shoulder, no pain with internal/external rotation.  No swelling or erythema at the elbows.  Tenderness over the right lateral iliac  Portacath/PICC-without erythema  Lab Results:  Lab Results  Component Value Date   WBC 6.9 11/22/2024   HGB 12.0 (L) 11/22/2024   HCT 39.1 11/22/2024   MCV 81.5 11/22/2024   PLT 163 11/22/2024   NEUTROABS 3.7 11/22/2024    CMP  Lab Results  Component Value Date   NA 138 11/22/2024   K 4.2 11/22/2024   CL 101 11/22/2024   CO2 29 11/22/2024   GLUCOSE 125 (H) 11/22/2024   BUN 12 11/22/2024   CREATININE 1.17 11/22/2024   CALCIUM 9.3 11/22/2024   PROT 7.2 11/22/2024   ALBUMIN  3.7 11/22/2024   AST 22 11/22/2024   ALT 15 11/22/2024   ALKPHOS 52 11/22/2024   BILITOT 0.4 11/22/2024   GFRNONAA >60 11/22/2024   GFRAA 57 (L) 11/09/2020    Lab Results  Component Value Date   CEA <1.00 07/12/2024     Medications: I have reviewed the patient's current medications.   Assessment/Plan: Gastric  cancer Upper endoscopy 07/01/2024-large fungating, ulcerated, noncircumferential mass on the lesser curvature of the stomach.  Pathology-invasive moderately differentiated adenocarcinoma; HER2 negative, loss of MLH1 and PMS2, Claudin 18-negative, PD-L1 CPS-50, MLH1 methylation detected, BRAF-negative; foundation 1-MSI high, tumor mutation burden 41 CTs 07/06/2024-enhancing lesser curvature wall thickening/mass; mesenteric root fat stranding and multiple retroperitoneal/periaortic and celiac lymph nodes; new right upper lobe upper lobe pulmonary nodule; multiple prominent mediastinal lymph nodes 07/29/2024 PET scan-hypermetabolic gastric mass.  No evidence of metastatic disease. Cycle 1 ipilimumab /nivolumab  08/17/2024, day 15 nivolumab  08/31/2024, day 29 nivolumab  09/14/2024 Cycle 2 ipilimumab /nivolumab  09/29/2024, day 15 nivolumab  10/13/2024, day 29 nivolumab  10/24/2024 Cycle 3 ipilimumab /nivolumab  11/08/2024, day 15 nivolumab  11/22/2024   Iron  deficiency anemia  Oral iron  initiated Hemoglobin improved 08/17/2024 CAD/CHF Diabetes Hypertension CKD Obstructive sleep apnea History of pulmonary embolus August 2022-previously on apixaban .  Apixaban  stopped due to bleeding. Ascending aortic aneurysm Admission 07/14/2024 with pneumonia and respiratory failure    Disposition: Bradley Alvarez has gastric cancer.  He is completing cycle 3 ipilimumab /nivolumab .  He will undergo a restaging CT evaluation on 11/25/2024. The arthralgias may be related to degenerative arthritis or less likely inflammatory arthritis due to immunotherapy.  We checked an ANA and rheumatoid factor today.  A rheumatoid factor was positive in 2022.  He will continue Tylenol  as needed for pain.  Bradley Alvarez will return for an office visit 11/29/2024.  Arley Hof, MD  11/22/2024  9:40 AM   "

## 2024-11-22 NOTE — Progress Notes (Signed)
 Patient seen by Dr. Arley Hof today  Vitals are within treatment parameters:Yes   Labs are within treatment parameters: Yes   Treatment plan has been signed: Yes   Per physician team, Patient is ready for treatment and there are NO modifications to the treatment plan.

## 2024-11-22 NOTE — Patient Instructions (Signed)
 [facility] - A DEPT OF [facility]  Discharge Instructions: Thank you for choosing [facility] to provide your oncology and hematology care.   If you have a lab appointment with the Cancer Center, please go directly to the Cancer Center and check in at the registration area.   Wear comfortable clothing and clothing appropriate for easy access to any Portacath or PICC line.   We strive to give you quality time with your provider. You may need to reschedule your appointment if you arrive late (15 or more minutes).  Arriving late affects you and other patients whose appointments are after yours.  Also, if you miss three or more appointments without notifying the office, you may be dismissed from the clinic at the provider's discretion.      For prescription refill requests, have your pharmacy contact our office and allow 72 hours for refills to be completed.    Today you received the following chemotherapy and/or immunotherapy agents: nivolumab   (Opdivo ), ipilimumab  (yervoy )  Ipilimumab  Injection What is this medication? IPILIMUMAB  (IP i LIM ue mab) treats some types of cancer. It works by helping your immune system slow or stop the spread of cancer cells. It is a monoclonal antibody. This medicine may be used for other purposes; ask your health care provider or pharmacist if you have questions. COMMON BRAND NAME(S): YERVOY  What should I tell my care team before I take this medication? They need to know if you have any of these conditions: Allogeneic stem cell transplant (uses someone else's stem cells) Autoimmune diseases, such as Crohn disease, ulcerative colitis, lupus Nervous system problems, such as Guillain-Barre syndrome or myasthenia gravis Organ transplant An unusual or allergic reaction to ipilimumab , other medications, foods, dyes, or preservatives Pregnant or trying to get pregnant Breast-feeding How should I use this  medication? This medication is infused into a vein. It is given by your care team in a hospital or clinic setting. A special MedGuide will be given to you before each treatment. Be sure to read this information carefully each time. Talk to your care team about the use of this medication in children. While it may be prescribed for children as young as 12 years for selected conditions, precautions do apply. Overdosage: If you think you have taken too much of this medicine contact a poison control center or emergency room at once. NOTE: This medicine is only for you. Do not share this medicine with others. What if I miss a dose? Keep appointments for follow-up doses. It is important not to miss your dose. Call your care team if you are unable to keep an appointment. What may interact with this medication? Interactions are not expected. This list may not describe all possible interactions. Give your health care provider a list of all the medicines, herbs, non-prescription drugs, or dietary supplements you use. Also tell them if you smoke, drink alcohol , or use illegal drugs. Some items may interact with your medicine. What should I watch for while using this medication? Your condition will be monitored carefully while you are receiving this medication. You may need blood work while taking this medication. This medication may cause serious skin reactions. They can happen weeks to months after starting the medication. Contact your care team right away if you notice fevers or flu-like symptoms with a rash. The rash may be red or purple and then turn into blisters or peeling of the skin. You may also notice a red  rash with swelling of the face, lips, or lymph nodes in your neck or under your arms. Tell your care team right away if you have any change in your eyesight. Talk to your care team if you may be pregnant. Serious birth defects can occur if you take this medication during pregnancy and for 3 months  after the last dose. You will need a negative pregnancy test before starting this medication. Contraception is recommended while taking this medication and for 3 months after the last dose. Your care team can help you find the option that works for you. Do not breastfeed while taking this medication and for 3 months after the last dose. What side effects may I notice from receiving this medication? Side effects that you should report to your care team as soon as possible: Allergic reactions--skin rash, itching, hives, swelling of the face, lips, tongue, or throat Dry cough, shortness of breath or trouble breathing Eye pain, redness, irritation, or discharge with blurry or decreased vision Heart muscle inflammation--unusual weakness or fatigue, shortness of breath, chest pain, fast or irregular heartbeat, dizziness, swelling of the ankles, feet, or hands Hormone gland problems--headache, sensitivity to light, unusual weakness or fatigue, dizziness, fast or irregular heartbeat, increased sensitivity to cold or heat, excessive sweating, constipation, hair loss, increased thirst or amount of urine, tremors or shaking, irritability Infusion reactions--chest pain, shortness of breath or trouble breathing, feeling faint or lightheaded Kidney injury (glomerulonephritis)--decrease in the amount of urine, red or dark brown urine, foamy or bubbly urine, swelling of the ankles, hands, or feet Liver injury--right upper belly pain, loss of appetite, nausea, light-colored stool, dark yellow or brown urine, yellowing skin or eyes, unusual weakness or fatigue Pain, tingling, or numbness in the hands or feet, muscle weakness, change in vision, confusion or trouble speaking, loss of balance or coordination, trouble walking, seizures Rash, fever, and swollen lymph nodes Redness, blistering, peeling, or loosening of the skin, including inside the mouth Sudden or severe stomach pain, bloody diarrhea, fever, nausea,  vomiting Side effects that usually do not require medical attention (report to your care team if they continue or are bothersome): Bone, joint, or muscle pain Diarrhea Fatigue Loss of appetite Nausea Skin rash This list may not describe all possible side effects. Call your doctor for medical advice about side effects. You may report side effects to FDA at 1-800-FDA-1088. Where should I keep my medication? This medication is given in a hospital or clinic. It will not be stored at home. NOTE: This sheet is a summary. It may not cover all possible information. If you have questions about this medicine, talk to your doctor, pharmacist, or health care provider.  2024 Elsevier/Gold Standard (2022-04-04 00:00:00)  Nivolumab  Injection What is this medication? NIVOLUMAB  (nye VOL ue mab) treats some types of cancer. It works by helping your immune system slow or stop the spread of cancer cells. It is a monoclonal antibody. This medicine may be used for other purposes; ask your health care provider or pharmacist if you have questions. COMMON BRAND NAME(S): Opdivo  What should I tell my care team before I take this medication? They need to know if you have any of these conditions: Allogeneic stem cell transplant (uses someone else's stem cells) Autoimmune diseases, such as Crohn disease, ulcerative colitis, lupus History of chest radiation Nervous system problems, such as Guillain-Barre syndrome or myasthenia gravis Organ transplant An unusual or allergic reaction to nivolumab , other medications, foods, dyes, or preservatives Pregnant or trying to get pregnant  Breast-feeding How should I use this medication? This medication is infused into a vein. It is given in a hospital or clinic setting. A special MedGuide will be given to you before each treatment. Be sure to read this information carefully each time. Talk to your care team about the use of this medication in children. While it may be  prescribed for children as young as 12 years for selected conditions, precautions do apply. Overdosage: If you think you have taken too much of this medicine contact a poison control center or emergency room at once. NOTE: This medicine is only for you. Do not share this medicine with others. What if I miss a dose? Keep appointments for follow-up doses. It is important not to miss your dose. Call your care team if you are unable to keep an appointment. What may interact with this medication? Interactions have not been studied. This list may not describe all possible interactions. Give your health care provider a list of all the medicines, herbs, non-prescription drugs, or dietary supplements you use. Also tell them if you smoke, drink alcohol , or use illegal drugs. Some items may interact with your medicine. What should I watch for while using this medication? Your condition will be monitored carefully while you are receiving this medication. You may need blood work while taking this medication. This medication may cause serious skin reactions. They can happen weeks to months after starting the medication. Contact your care team right away if you notice fevers or flu-like symptoms with a rash. The rash may be red or purple and then turn into blisters or peeling of the skin. You may also notice a red rash with swelling of the face, lips, or lymph nodes in your neck or under your arms. Tell your care team right away if you have any change in your eyesight. Talk to your care team if you are pregnant or think you might be pregnant. A negative pregnancy test is required before starting this medication. A reliable form of contraception is recommended while taking this medication and for 5 months after the last dose. Talk to your care team about effective forms of contraception. Do not breast-feed while taking this medication and for 5 months after the last dose. What side effects may I notice from receiving  this medication? Side effects that you should report to your care team as soon as possible: Allergic reactions--skin rash, itching, hives, swelling of the face, lips, tongue, or throat Dry cough, shortness of breath or trouble breathing Eye pain, redness, irritation, or discharge with blurry or decreased vision Heart muscle inflammation--unusual weakness or fatigue, shortness of breath, chest pain, fast or irregular heartbeat, dizziness, swelling of the ankles, feet, or hands Hormone gland problems--headache, sensitivity to light, unusual weakness or fatigue, dizziness, fast or irregular heartbeat, increased sensitivity to cold or heat, excessive sweating, constipation, hair loss, increased thirst or amount of urine, tremors or shaking, irritability Infusion reactions--chest pain, shortness of breath or trouble breathing, feeling faint or lightheaded Kidney injury (glomerulonephritis)--decrease in the amount of urine, red or dark brown urine, foamy or bubbly urine, swelling of the ankles, hands, or feet Liver injury--right upper belly pain, loss of appetite, nausea, light-colored stool, dark yellow or brown urine, yellowing skin or eyes, unusual weakness or fatigue Pain, tingling, or numbness in the hands or feet, muscle weakness, change in vision, confusion or trouble speaking, loss of balance or coordination, trouble walking, seizures Rash, fever, and swollen lymph nodes Redness, blistering, peeling, or loosening of  the skin, including inside the mouth Sudden or severe stomach pain, bloody diarrhea, fever, nausea, vomiting Side effects that usually do not require medical attention (report these to your care team if they continue or are bothersome): Bone, joint, or muscle pain Diarrhea Fatigue Loss of appetite Nausea Skin rash This list may not describe all possible side effects. Call your doctor for medical advice about side effects. You may report side effects to FDA at 1-800-FDA-1088. Where  should I keep my medication? This medication is given in a hospital or clinic. It will not be stored at home. NOTE: This sheet is a summary. It may not cover all possible information. If you have questions about this medicine, talk to your doctor, pharmacist, or health care provider.  2024 Elsevier/Gold Standard (2022-03-17 00:00:00)      To help prevent nausea and vomiting after your treatment, we encourage you to take your nausea medication as directed.  BELOW ARE SYMPTOMS THAT SHOULD BE REPORTED IMMEDIATELY: *FEVER GREATER THAN 100.4 F (38 C) OR HIGHER *CHILLS OR SWEATING *NAUSEA AND VOMITING THAT IS NOT CONTROLLED WITH YOUR NAUSEA MEDICATION *UNUSUAL SHORTNESS OF BREATH *UNUSUAL BRUISING OR BLEEDING *URINARY PROBLEMS (pain or burning when urinating, or frequent urination) *BOWEL PROBLEMS (unusual diarrhea, constipation, pain near the anus) TENDERNESS IN MOUTH AND THROAT WITH OR WITHOUT PRESENCE OF ULCERS (sore throat, sores in mouth, or a toothache) UNUSUAL RASH, SWELLING OR PAIN  UNUSUAL VAGINAL DISCHARGE OR ITCHING   Items with * indicate a potential emergency and should be followed up as soon as possible or go to the Emergency Department if any problems should occur.  Please show the CHEMOTHERAPY ALERT CARD or IMMUNOTHERAPY ALERT CARD at check-in to the Emergency Department and triage nurse.  Should you have questions after your visit or need to cancel or reschedule your appointment, please contact [facility]  Dept: [phone]  and follow the prompts.  Office hours are 8:00 a.m. to 4:30 p.m. Monday - Friday. Please note that voicemails left after 4:00 p.m. may not be returned until the following business day.  We are closed weekends and major holidays. You have access to a nurse at all times for urgent questions. Please call the main number to the clinic Dept: [phone] and follow the prompts.   For any non-urgent  questions, you may also contact your provider using MyChart. We now offer e-Visits for anyone 19 and older to request care online for non-urgent symptoms. For details visit [email or web address].   Also download the MyChart app! Go to the app store, search MyChart, open the app, select [facility], and log in with your MyChart username and password.

## 2024-11-23 LAB — RHEUMATOID FACTOR: Rheumatoid fact SerPl-aCnc: <10 [IU]/mL

## 2024-11-23 LAB — FANA STAINING PATTERNS: Speckled Pattern: 24529

## 2024-11-23 LAB — ANTINUCLEAR ANTIBODIES, IFA: ANA Ab, IFA: POSITIVE — AB

## 2024-11-25 ENCOUNTER — Ambulatory Visit (HOSPITAL_BASED_OUTPATIENT_CLINIC_OR_DEPARTMENT_OTHER)
Admission: RE | Admit: 2024-11-25 | Discharge: 2024-11-25 | Disposition: A | Discharge status: Home / Self Care | Source: Ambulatory Visit | Attending: Oncology | Admitting: Oncology

## 2024-11-25 ENCOUNTER — Other Ambulatory Visit (HOSPITAL_BASED_OUTPATIENT_CLINIC_OR_DEPARTMENT_OTHER): Payer: Self-pay

## 2024-11-25 DIAGNOSIS — C169 Malignant neoplasm of stomach, unspecified: Secondary | ICD-10-CM | POA: Diagnosis present

## 2024-11-28 ENCOUNTER — Ambulatory Visit (INDEPENDENT_AMBULATORY_CARE_PROVIDER_SITE_OTHER): Admitting: Podiatry

## 2024-11-28 ENCOUNTER — Encounter: Payer: Self-pay | Admitting: Podiatry

## 2024-11-28 VITALS — Ht 74.0 in | Wt 289.9 lb

## 2024-11-28 DIAGNOSIS — B351 Tinea unguium: Secondary | ICD-10-CM | POA: Diagnosis not present

## 2024-11-28 DIAGNOSIS — M79675 Pain in left toe(s): Secondary | ICD-10-CM

## 2024-11-28 DIAGNOSIS — M79674 Pain in right toe(s): Secondary | ICD-10-CM | POA: Diagnosis not present

## 2024-11-28 DIAGNOSIS — E119 Type 2 diabetes mellitus without complications: Secondary | ICD-10-CM | POA: Diagnosis not present

## 2024-11-28 NOTE — Progress Notes (Signed)
 This patient returns to my office for at risk foot care.  This patient requires this care by a professional since this patient will be at risk due to having  Diabetes.  This patient is unable to cut nails himself since the patient cannot reach his nails.These nails are painful walking and wearing shoes. Patient says he has taken antibiotics due to cellulitis right leg. This patient presents for at risk foot care today.  General Appearance  Alert, conversant and in no acute stress.  Vascular  Dorsalis pedis  are  weakly  palpable  bilaterally.  Posterior tibial pulses are absent due to ankle swelling.  Absent pedal hair.Capillary return is within normal limits  bilaterally. Cold feet bilaterally.  Neurologic  Senn-Weinstein monofilament wire test diminished  bilaterally. Muscle power within normal limits bilaterally.  Nails Thick disfigured discolored nails with subungual debris  from hallux to fifth toes bilaterally. No evidence of bacterial infection or drainage bilaterally.  Orthopedic  No limitations of motion  feet .  No crepitus or effusions noted.  No bony pathology or digital deformities noted. HAV  B/L.  PTT tendinitis/arthritis left foot.  Pes planus  Skin  normotropic skin with no porokeratosis noted bilaterally.  No signs of infections or ulcers noted.     Onychomycosis  Pain in right toes  Pain in left toes  Consent was obtained for treatment procedures.   Mechanical debridement of nails 1-5  bilaterally performed with a nail nipper.  Filed with dremel without incident.    Return office visit   3 months                   Told patient to return for periodic foot care and evaluation due to potential at risk complications.   Cordella Bold DPM

## 2024-11-29 ENCOUNTER — Inpatient Hospital Stay (HOSPITAL_BASED_OUTPATIENT_CLINIC_OR_DEPARTMENT_OTHER): Admitting: Oncology

## 2024-11-29 ENCOUNTER — Other Ambulatory Visit (HOSPITAL_BASED_OUTPATIENT_CLINIC_OR_DEPARTMENT_OTHER): Payer: Self-pay

## 2024-11-29 VITALS — BP 123/60 | HR 68 | Temp 97.9°F | Resp 18 | Ht 74.0 in | Wt 294.0 lb

## 2024-11-29 DIAGNOSIS — Z5112 Encounter for antineoplastic immunotherapy: Secondary | ICD-10-CM | POA: Diagnosis not present

## 2024-11-29 DIAGNOSIS — C169 Malignant neoplasm of stomach, unspecified: Secondary | ICD-10-CM | POA: Diagnosis not present

## 2024-11-29 MED ORDER — PREDNISONE 10 MG PO TABS
40.0000 mg | ORAL_TABLET | Freq: Every day | ORAL | 0 refills | Status: DC
  Filled 2024-11-29: qty 56, 14d supply, fill #0

## 2024-11-29 NOTE — Progress Notes (Signed)
 " Bradley Alvarez OFFICE PROGRESS NOTE   Diagnosis: Gastric cancer  INTERVAL HISTORY:   Bradley Alvarez returns as scheduled.  He completed another treatment with ipilimumab /nivolumab  on 11/08/2024.  The rash has improved.  No diarrhea. He has an increased cough.  He has chronic dyspnea.  His chief complaint is pain at the bilateral shoulders, and right hip .  The pain is limiting his ability to perform usual activities.  He is using Tylenol  and hydrocodone  for pain.  Objective:  Vital signs in last 24 hours:  Blood pressure 123/60, pulse 68, temperature 97.9 F (36.6 C), temperature source Temporal, resp. rate 18, height 6' 2 (1.88 m), weight 294 lb (133.4 kg), SpO2 93%.    HEENT: No thrush Resp: Mild bilateral end inspiratory wheeze, no respiratory distress Cardio: Regular rate and rhythm GI: Nontender, no hepatosplenomegaly, no mass Vascular: The right lower leg is larger than the left side Musculoskeletal: Limited range of motion with abduction at the bilateral shoulder, no pain with internal/external rotation at the shoulders or hip.  Mild synovitic thickening at the right hand. Skin: Dryness, no significant rash  Portacath/PICC-without erythema  Lab Results:  Lab Results  Component Value Date   WBC 6.9 11/22/2024   HGB 12.0 (L) 11/22/2024   HCT 39.1 11/22/2024   MCV 81.5 11/22/2024   PLT 163 11/22/2024   NEUTROABS 3.7 11/22/2024    CMP  Lab Results  Component Value Date   NA 138 11/22/2024   K 4.2 11/22/2024   CL 101 11/22/2024   CO2 29 11/22/2024   GLUCOSE 125 (H) 11/22/2024   BUN 12 11/22/2024   CREATININE 1.17 11/22/2024   CALCIUM 9.3 11/22/2024   PROT 7.2 11/22/2024   ALBUMIN  3.7 11/22/2024   AST 22 11/22/2024   ALT 15 11/22/2024   ALKPHOS 52 11/22/2024   BILITOT 0.4 11/22/2024   GFRNONAA >60 11/22/2024   GFRAA 57 (L) 11/09/2020    Lab Results  Component Value Date   CEA <1.00 07/12/2024    Medications: I have reviewed the patient's  current medications.   Assessment/Plan:  Gastric cancer Upper endoscopy 07/01/2024-large fungating, ulcerated, noncircumferential mass on the lesser curvature of the stomach.  Pathology-invasive moderately differentiated adenocarcinoma; HER2 negative, loss of MLH1 and PMS2, Claudin 18-negative, PD-L1 CPS-50, MLH1 methylation detected, BRAF-negative; foundation 1-MSI high, tumor mutation burden 41 CTs 07/06/2024-enhancing lesser curvature wall thickening/mass; mesenteric root fat stranding and multiple retroperitoneal/periaortic and celiac lymph nodes; new right upper lobe upper lobe pulmonary nodule; multiple prominent mediastinal lymph nodes 07/29/2024 PET scan-hypermetabolic gastric mass.  No evidence of metastatic disease. Cycle 1 ipilimumab /nivolumab  08/17/2024, day 15 nivolumab  08/31/2024, day 29 nivolumab  09/14/2024 Cycle 2 ipilimumab /nivolumab  09/29/2024, day 15 nivolumab  10/13/2024, day 29 nivolumab  10/24/2024 Cycle 3 ipilimumab /nivolumab  11/08/2024, day 15 nivolumab  11/22/2024 11/25/2024 CTs: No measurable tumor in the gastric lumen, no evidence of metastatic disease   Iron  deficiency anemia  Oral iron  initiated Hemoglobin improved 08/17/2024 CAD/CHF Diabetes Hypertension CKD Obstructive sleep apnea History of pulmonary embolus August 2022-previously on apixaban .  Apixaban  stopped due to bleeding. Ascending aortic aneurysm Admission 07/14/2024 with pneumonia and respiratory failure Arthralgias-likely secondary to toxicity from immunotherapy 11/29/2024 prednisone  40 mg daily    Disposition: Bradley Alvarez has a history of gastric cancer.  He has completed several cycles of immunotherapy with ipilimumab /nivolumab .  He has tolerated the treatment well aside from the development of arthralgias.  The arthralgias have progressed over the past few weeks.  Treatment will be placed on hold.  He will begin  a trial of prednisone .  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   A rheumatoid factor was negative and an ANA was low-level + 11/22/2024.  He will monitor his blood sugar and contact us  for blood sugar greater than 300.  Bradley Alvarez will return for an office visit on 12/07/2023.  The prednisone  will be tapered as tolerated.  We will consider resuming nivolumab  if the prednisone  can be tapered to less 10 mg or less daily.  We will make a rheumatology referral if he does not improve on prednisone .  The restaging CT findings and images with Bradley Alvarez and his family.  The gastric mass is not apparent on the noncontrast images.  He appears to have experienced clinical radiologic improvement with immunotherapy.  Arley Hof, MD  11/29/2024  8:16 AM   "

## 2024-12-01 ENCOUNTER — Encounter: Payer: Self-pay | Admitting: Pulmonary Disease

## 2024-12-01 ENCOUNTER — Encounter: Payer: Self-pay | Admitting: Oncology

## 2024-12-02 ENCOUNTER — Encounter: Payer: Self-pay | Admitting: Pulmonary Disease

## 2024-12-02 ENCOUNTER — Encounter: Payer: Self-pay | Admitting: Oncology

## 2024-12-06 ENCOUNTER — Inpatient Hospital Stay: Attending: Oncology

## 2024-12-06 ENCOUNTER — Inpatient Hospital Stay

## 2024-12-06 ENCOUNTER — Encounter: Payer: Self-pay | Admitting: Nurse Practitioner

## 2024-12-06 ENCOUNTER — Encounter: Payer: Self-pay | Admitting: Oncology

## 2024-12-06 ENCOUNTER — Encounter: Payer: Self-pay | Admitting: Pulmonary Disease

## 2024-12-06 ENCOUNTER — Other Ambulatory Visit (HOSPITAL_BASED_OUTPATIENT_CLINIC_OR_DEPARTMENT_OTHER): Payer: Self-pay

## 2024-12-06 ENCOUNTER — Inpatient Hospital Stay: Admitting: Nurse Practitioner

## 2024-12-06 VITALS — BP 115/66 | HR 67 | Temp 98.1°F | Resp 18 | Ht 74.0 in | Wt 299.6 lb

## 2024-12-06 DIAGNOSIS — I130 Hypertensive heart and chronic kidney disease with heart failure and stage 1 through stage 4 chronic kidney disease, or unspecified chronic kidney disease: Secondary | ICD-10-CM | POA: Insufficient documentation

## 2024-12-06 DIAGNOSIS — I7121 Aneurysm of the ascending aorta, without rupture: Secondary | ICD-10-CM | POA: Insufficient documentation

## 2024-12-06 DIAGNOSIS — M2550 Pain in unspecified joint: Secondary | ICD-10-CM | POA: Diagnosis not present

## 2024-12-06 DIAGNOSIS — Z1732 Human epidermal growth factor receptor 2 negative status: Secondary | ICD-10-CM | POA: Diagnosis not present

## 2024-12-06 DIAGNOSIS — Z5112 Encounter for antineoplastic immunotherapy: Secondary | ICD-10-CM | POA: Diagnosis present

## 2024-12-06 DIAGNOSIS — D509 Iron deficiency anemia, unspecified: Secondary | ICD-10-CM | POA: Diagnosis not present

## 2024-12-06 DIAGNOSIS — E1122 Type 2 diabetes mellitus with diabetic chronic kidney disease: Secondary | ICD-10-CM | POA: Insufficient documentation

## 2024-12-06 DIAGNOSIS — Z7952 Long term (current) use of systemic steroids: Secondary | ICD-10-CM | POA: Diagnosis not present

## 2024-12-06 DIAGNOSIS — Z86711 Personal history of pulmonary embolism: Secondary | ICD-10-CM | POA: Diagnosis not present

## 2024-12-06 DIAGNOSIS — R911 Solitary pulmonary nodule: Secondary | ICD-10-CM | POA: Diagnosis not present

## 2024-12-06 DIAGNOSIS — G4733 Obstructive sleep apnea (adult) (pediatric): Secondary | ICD-10-CM | POA: Insufficient documentation

## 2024-12-06 DIAGNOSIS — C169 Malignant neoplasm of stomach, unspecified: Secondary | ICD-10-CM | POA: Insufficient documentation

## 2024-12-06 DIAGNOSIS — I2510 Atherosclerotic heart disease of native coronary artery without angina pectoris: Secondary | ICD-10-CM | POA: Diagnosis not present

## 2024-12-06 DIAGNOSIS — Z79620 Long term (current) use of immunosuppressive biologic: Secondary | ICD-10-CM | POA: Insufficient documentation

## 2024-12-06 LAB — SEDIMENTATION RATE: Sed Rate: 7 mm/h (ref 0–16)

## 2024-12-06 MED ORDER — PREDNISONE 10 MG PO TABS
30.0000 mg | ORAL_TABLET | Freq: Every day | ORAL | 0 refills | Status: AC
  Filled 2024-12-06: qty 50, 16d supply, fill #0

## 2024-12-06 NOTE — Patient Instructions (Signed)

## 2024-12-06 NOTE — Progress Notes (Signed)
" °  [facility] OFFICE PROGRESS NOTE   Diagnosis: Gastric cancer  INTERVAL HISTORY:   Mr. Ferrari returns as scheduled.  He completed cycle 3 ipilimumab /nivolumab  11/08/2024, 11/22/2024.  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  He was started on a trial of prednisone  11/29/2024.  He reports pain has completely resolved.  He feels he is tolerating prednisone  well, specifically no issues with sleeping.  Improved appetite.  He notes acid bumps at the right lower back.  He has had the bumps occur intermittently for years.  He relates the rash to his diet.  Objective:  Vital signs in last 24 hours:  Blood pressure 115/66, pulse 67, temperature 98.1 F (36.7 C), temperature source Temporal, resp. rate 18, height 6' 2 (1.88 m), weight 299 lb 9.6 oz (135.9 kg), SpO2 98%.    HEENT: No thrush. Resp: Lungs clear bilaterally. Cardio: Regular rate and rhythm. GI: No hepatosplenomegaly.  Nontender. Vascular: Right lower leg is larger than the left lower leg. Neuro: Alert and oriented. Skin: Dry skin at the right lower back.  No vesicular appearing lesions. Port-A-Cath without erythema.  Lab Results:  Lab Results  Component Value Date   WBC 6.9 11/22/2024   HGB 12.0 (L) 11/22/2024   HCT 39.1 11/22/2024   MCV 81.5 11/22/2024   PLT 163 11/22/2024   NEUTROABS 3.7 11/22/2024    Imaging:  No results found.  Medications: I have reviewed the patient's current medications.  Assessment/Plan: Gastric cancer Upper endoscopy 07/01/2024-large fungating, ulcerated, noncircumferential mass on the lesser curvature of the stomach.  Pathology-invasive moderately differentiated adenocarcinoma; HER2 negative, loss of MLH1 and PMS2, Claudin 18-negative, PD-L1 CPS-50, MLH1 methylation detected, BRAF-negative; foundation 1-MSI high, tumor mutation burden 41 CTs 07/06/2024-enhancing lesser curvature  wall thickening/mass; mesenteric root fat stranding and multiple retroperitoneal/periaortic and celiac lymph nodes; new right upper lobe upper lobe pulmonary nodule; multiple prominent mediastinal lymph nodes 07/29/2024 PET scan-hypermetabolic gastric mass.  No evidence of metastatic disease. Cycle 1 ipilimumab /nivolumab  08/17/2024, day 15 nivolumab  08/31/2024, day 29 nivolumab  09/14/2024 Cycle 2 ipilimumab /nivolumab  09/29/2024, day 15 nivolumab  10/13/2024, day 29 nivolumab  10/24/2024 Cycle 3 ipilimumab /nivolumab  11/08/2024, day 15 nivolumab  11/22/2024 11/25/2024 CTs: No measurable tumor in the gastric lumen, no evidence of metastatic disease   Iron  deficiency anemia  Oral iron  initiated Hemoglobin improved 08/17/2024 CAD/CHF Diabetes Hypertension CKD Obstructive sleep apnea History of pulmonary embolus August 2022-previously on apixaban .  Apixaban  stopped due to bleeding. Ascending aortic aneurysm Admission 07/14/2024 with pneumonia and respiratory failure Arthralgias-likely secondary to toxicity from immunotherapy 11/29/2024 prednisone  40 mg daily 12/07/2024 prednisone  30 mg daily  Disposition: Mr. Belson appears stable.  The arthralgias have resolved on prednisone  40 mg daily.  He will taper to 30 mg daily beginning tomorrow.  He understands to contact the office if the arthralgias recur.  Otherwise his wife will contact the office in 1 week with an update.  He will return for follow-up in 2 weeks.  We are available to see him sooner if needed.    [name] ANP/GNP-BC   12/06/2024  9:36 AM        "

## 2024-12-13 ENCOUNTER — Other Ambulatory Visit (HOSPITAL_BASED_OUTPATIENT_CLINIC_OR_DEPARTMENT_OTHER): Payer: Self-pay

## 2024-12-13 MED ORDER — METHOCARBAMOL 500 MG PO TABS
500.0000 mg | ORAL_TABLET | Freq: Every evening | ORAL | 1 refills | Status: AC | PRN
  Filled 2024-12-13: qty 30, 30d supply, fill #0

## 2024-12-18 ENCOUNTER — Other Ambulatory Visit: Payer: Self-pay | Admitting: Oncology

## 2024-12-19 ENCOUNTER — Telehealth: Payer: Self-pay | Admitting: *Deleted

## 2024-12-19 NOTE — Telephone Encounter (Signed)
 Mrs. Sarafian called to report he is currently on 30 mg Prednisone /day and still doing well in regards to pain.

## 2024-12-20 ENCOUNTER — Inpatient Hospital Stay

## 2024-12-20 ENCOUNTER — Inpatient Hospital Stay: Admitting: Nurse Practitioner

## 2024-12-20 ENCOUNTER — Encounter: Payer: Self-pay | Admitting: Nurse Practitioner

## 2024-12-20 VITALS — BP 126/64 | HR 60 | Temp 98.1°F | Resp 18 | Ht 74.0 in | Wt 306.2 lb

## 2024-12-20 DIAGNOSIS — C169 Malignant neoplasm of stomach, unspecified: Secondary | ICD-10-CM

## 2024-12-20 DIAGNOSIS — Z5112 Encounter for antineoplastic immunotherapy: Secondary | ICD-10-CM | POA: Diagnosis not present

## 2024-12-20 LAB — CMP (CANCER CENTER ONLY)
ALT: 49 U/L — ABNORMAL HIGH (ref 0–44)
AST: 25 U/L (ref 15–41)
Albumin: 3.8 g/dL (ref 3.5–5.0)
Alkaline Phosphatase: 41 U/L (ref 38–126)
Anion gap: 9 (ref 5–15)
BUN: 16 mg/dL (ref 8–23)
CO2: 29 mmol/L (ref 22–32)
Calcium: 9.2 mg/dL (ref 8.9–10.3)
Chloride: 102 mmol/L (ref 98–111)
Creatinine: 1.11 mg/dL (ref 0.61–1.24)
GFR, Estimated: >60 mL/min
Glucose, Bld: 152 mg/dL — ABNORMAL HIGH (ref 70–99)
Potassium: 4.4 mmol/L (ref 3.5–5.1)
Sodium: 141 mmol/L (ref 135–145)
Total Bilirubin: 0.6 mg/dL (ref 0.0–1.2)
Total Protein: 6.2 g/dL — ABNORMAL LOW (ref 6.5–8.1)

## 2024-12-20 LAB — CBC WITH DIFFERENTIAL (CANCER CENTER ONLY)
Abs Immature Granulocytes: 0.03 K/uL (ref 0.00–0.07)
Basophils Absolute: 0 K/uL (ref 0.0–0.1)
Basophils Relative: 0 %
Eosinophils Absolute: 0.1 K/uL (ref 0.0–0.5)
Eosinophils Relative: 1 %
HCT: 37.9 % — ABNORMAL LOW (ref 39.0–52.0)
Hemoglobin: 11.7 g/dL — ABNORMAL LOW (ref 13.0–17.0)
Immature Granulocytes: 0 %
Lymphocytes Relative: 19 %
Lymphs Abs: 1.7 K/uL (ref 0.7–4.0)
MCH: 26.5 pg (ref 26.0–34.0)
MCHC: 30.9 g/dL (ref 30.0–36.0)
MCV: 85.7 fL (ref 80.0–100.0)
Monocytes Absolute: 0.9 K/uL (ref 0.1–1.0)
Monocytes Relative: 10 %
Neutro Abs: 6.2 K/uL (ref 1.7–7.7)
Neutrophils Relative %: 70 %
Platelet Count: 106 K/uL — ABNORMAL LOW (ref 150–400)
RBC: 4.42 MIL/uL (ref 4.22–5.81)
RDW: 17.6 % — ABNORMAL HIGH (ref 11.5–15.5)
WBC Count: 8.9 K/uL (ref 4.0–10.5)
nRBC: 0 % (ref 0.0–0.2)

## 2024-12-20 LAB — TSH: TSH: 1.33 u[IU]/mL (ref 0.350–4.500)

## 2024-12-20 NOTE — Progress Notes (Signed)
" °  [facility] OFFICE PROGRESS NOTE   Diagnosis: Gastric cancer  INTERVAL HISTORY:   Bradley Alvarez returns as scheduled.  Prednisone  was tapered to 30 mg daily 12/06/2024.  Pain continues to be resolved.  No rash or pruritus.  He has a good appetite.  No nausea or vomiting.  No diarrhea.  No dysphagia.  Objective:  Vital signs in last 24 hours:  Blood pressure 126/64, pulse 60, temperature 98.1 F (36.7 C), temperature source Temporal, resp. rate 18, height 6' 2 (1.88 m), weight (!) 306 lb 3.2 oz (138.9 kg), SpO2 96%.    HEENT: No thrush or ulcers. Resp: Lungs clear bilaterally. Cardio: Regular rate and rhythm. GI: No hepatosplenomegaly.  Nontender.  No mass. Vascular: Right lower leg is larger than the left lower leg. Neuro: Alert and oriented. Skin: No rash. Port-A-Cath without erythema.  Lab Results:  Lab Results  Component Value Date   WBC 8.9 12/20/2024   HGB 11.7 (L) 12/20/2024   HCT 37.9 (L) 12/20/2024   MCV 85.7 12/20/2024   PLT 106 (L) 12/20/2024   NEUTROABS 6.2 12/20/2024    Imaging:  No results found.  Medications: I have reviewed the patient's current medications.  Assessment/Plan: Gastric cancer Upper endoscopy 07/01/2024-large fungating, ulcerated, noncircumferential mass on the lesser curvature of the stomach.  Pathology-invasive moderately differentiated adenocarcinoma; HER2 negative, loss of MLH1 and PMS2, Claudin 18-negative, PD-L1 CPS-50, MLH1 methylation detected, BRAF-negative; foundation 1-MSI high, tumor mutation burden 41 CTs 07/06/2024-enhancing lesser curvature wall thickening/mass; mesenteric root fat stranding and multiple retroperitoneal/periaortic and celiac lymph nodes; new right upper lobe upper lobe pulmonary nodule; multiple prominent mediastinal lymph nodes 07/29/2024 PET scan-hypermetabolic gastric mass.  No evidence of metastatic disease. 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  Trial of prednisone  initiated.  Arthralgias resolved.  Prednisone  tapered to 10 mg daily beginning 12/21/2024.    Iron  deficiency anemia  Oral iron  initiated Hemoglobin improved 08/17/2024 CAD/CHF Diabetes Hypertension CKD Obstructive sleep apnea History of pulmonary embolus August 2022-previously on apixaban .  Apixaban  stopped due to bleeding. Ascending aortic aneurysm Admission 07/14/2024 with pneumonia and respiratory failure Arthralgias-likely secondary to toxicity from immunotherapy 11/29/2024 prednisone  40 mg daily 12/07/2024 prednisone  30 mg daily 12/21/2024 prednisone  10 mg daily  Disposition: Mr. Bradley Alvarez appears stable.  Arthralgias, rash/pruritus have resolved.  He will taper prednisone  to 10 mg daily.  He will return for follow-up in 1 week.  If doing well at that time the plan is to resume nivolumab  alone.  He and his wife are in agreement.  He will return for follow-up and possible treatment in 1 week.  We are available to see him sooner if needed.  Plan reviewed with Dr. Cloretta.    [name] ANP/GNP-BC   12/20/2024  9:27 AM        "

## 2024-12-20 NOTE — Patient Instructions (Signed)

## 2024-12-21 LAB — T4: T4, Total: 5.8 ug/dL (ref 4.5–12.0)

## 2024-12-27 ENCOUNTER — Inpatient Hospital Stay: Admitting: Oncology

## 2024-12-27 ENCOUNTER — Inpatient Hospital Stay

## 2024-12-27 ENCOUNTER — Other Ambulatory Visit (HOSPITAL_BASED_OUTPATIENT_CLINIC_OR_DEPARTMENT_OTHER): Payer: Self-pay

## 2024-12-27 VITALS — BP 150/76 | HR 59 | Temp 98.8°F | Resp 16

## 2024-12-27 VITALS — BP 126/65 | HR 60 | Temp 98.0°F | Resp 18 | Ht 74.0 in | Wt 297.2 lb

## 2024-12-27 DIAGNOSIS — C169 Malignant neoplasm of stomach, unspecified: Secondary | ICD-10-CM

## 2024-12-27 DIAGNOSIS — Z5112 Encounter for antineoplastic immunotherapy: Secondary | ICD-10-CM | POA: Diagnosis not present

## 2024-12-27 LAB — CBC WITH DIFFERENTIAL (CANCER CENTER ONLY)
Abs Immature Granulocytes: 0.02 10*3/uL (ref 0.00–0.07)
Basophils Absolute: 0 10*3/uL (ref 0.0–0.1)
Basophils Relative: 0 %
Eosinophils Absolute: 0.1 10*3/uL (ref 0.0–0.5)
Eosinophils Relative: 2 %
HCT: 39.7 % (ref 39.0–52.0)
Hemoglobin: 12.5 g/dL — ABNORMAL LOW (ref 13.0–17.0)
Immature Granulocytes: 0 %
Lymphocytes Relative: 18 %
Lymphs Abs: 1.2 10*3/uL (ref 0.7–4.0)
MCH: 27.1 pg (ref 26.0–34.0)
MCHC: 31.5 g/dL (ref 30.0–36.0)
MCV: 86.1 fL (ref 80.0–100.0)
Monocytes Absolute: 0.7 10*3/uL (ref 0.1–1.0)
Monocytes Relative: 11 %
Neutro Abs: 4.8 10*3/uL (ref 1.7–7.7)
Neutrophils Relative %: 69 %
Platelet Count: 117 10*3/uL — ABNORMAL LOW (ref 150–400)
RBC: 4.61 MIL/uL (ref 4.22–5.81)
RDW: 17.5 % — ABNORMAL HIGH (ref 11.5–15.5)
WBC Count: 6.9 10*3/uL (ref 4.0–10.5)
nRBC: 0 % (ref 0.0–0.2)

## 2024-12-27 LAB — CMP (CANCER CENTER ONLY)
ALT: 42 U/L (ref 0–44)
AST: 28 U/L (ref 15–41)
Albumin: 3.9 g/dL (ref 3.5–5.0)
Alkaline Phosphatase: 48 U/L (ref 38–126)
Anion gap: 9 (ref 5–15)
BUN: 12 mg/dL (ref 8–23)
CO2: 30 mmol/L (ref 22–32)
Calcium: 9.3 mg/dL (ref 8.9–10.3)
Chloride: 101 mmol/L (ref 98–111)
Creatinine: 1.1 mg/dL (ref 0.61–1.24)
GFR, Estimated: >60 mL/min
Glucose, Bld: 235 mg/dL — ABNORMAL HIGH (ref 70–99)
Potassium: 4.3 mmol/L (ref 3.5–5.1)
Sodium: 140 mmol/L (ref 135–145)
Total Bilirubin: 0.6 mg/dL (ref 0.0–1.2)
Total Protein: 6.7 g/dL (ref 6.5–8.1)

## 2024-12-27 LAB — TSH: TSH: 1.54 u[IU]/mL (ref 0.350–4.500)

## 2024-12-27 MED ORDER — SODIUM CHLORIDE 0.9 % IV SOLN: INTRAVENOUS | Status: DC

## 2024-12-27 MED ORDER — LIDOCAINE-PRILOCAINE 2.5-2.5 % EX CREA
1.0000 | TOPICAL_CREAM | CUTANEOUS | 1 refills | Status: AC | PRN
  Filled 2024-12-27: qty 30, 30d supply, fill #0

## 2024-12-27 MED ORDER — SODIUM CHLORIDE 0.9 % IV SOLN
240.0000 mg | Freq: Once | INTRAVENOUS | Status: AC
  Administered 2024-12-27: 240 mg via INTRAVENOUS
  Filled 2024-12-27: qty 24

## 2024-12-27 NOTE — Progress Notes (Signed)
 Patient seen by Dr. Arley Hof today  Vitals are within treatment parameters:Yes   Labs are within treatment parameters: Yes   Treatment plan has been signed: Yes   Per physician team, Patient is ready for treatment. Please note the following modifications: Will only receive nivolumab  today

## 2024-12-27 NOTE — Progress Notes (Signed)
 " [facility] OFFICE PROGRESS NOTE   Diagnosis: Gastric cancer  INTERVAL HISTORY:   Bradley Alvarez returns as scheduled.  He continues prednisone  at a dose of 10 mg daily.  Arthralgias have resolved.  No rash.  No complaint.  Objective:  Vital signs in last 24 hours:  Blood pressure 126/65, pulse 60, temperature 98 F (36.7 C), temperature source Temporal, resp. rate 18, height 6' 2 (1.88 m), weight 297 lb 3.2 oz (134.8 kg), SpO2 98%.    HEENT: No thrush Resp: Lungs clear bilaterally Cardio: Regular rate and rhythm GI: No hepatosplenomegaly, nontender Vascular: No leg edema  Skin: Faded macular rash over the trunk and arms Musculoskeletal: No pain with motion at the shoulders.  Lab Results:  Lab Results  Component Value Date   WBC 6.9 12/27/2024   HGB 12.5 (L) 12/27/2024   HCT 39.7 12/27/2024   MCV 86.1 12/27/2024   PLT 117 (L) 12/27/2024   NEUTROABS 4.8 12/27/2024    CMP  Lab Results  Component Value Date   NA 140 12/27/2024   K 4.3 12/27/2024   CL 101 12/27/2024   CO2 30 12/27/2024   GLUCOSE 235 (H) 12/27/2024   BUN 12 12/27/2024   CREATININE 1.10 12/27/2024   CALCIUM 9.3 12/27/2024   PROT 6.7 12/27/2024   ALBUMIN  3.9 12/27/2024   AST 28 12/27/2024   ALT 42 12/27/2024   ALKPHOS 48 12/27/2024   BILITOT 0.6 12/27/2024   GFRNONAA >60 12/27/2024   GFRAA 57 (L) 11/09/2020    Lab Results  Component Value Date   CEA <1.00 07/12/2024     Medications: I have reviewed the patient's current medications.   Assessment/Plan: Gastric cancer Upper endoscopy 07/01/2024-large fungating, ulcerated, noncircumferential mass on the lesser curvature of the stomach.  Pathology-invasive moderately differentiated adenocarcinoma; HER2 negative, loss of MLH1 and PMS2, Claudin 18-negative, PD-L1 CPS-50, MLH1 methylation detected, BRAF-negative; foundation 1-MSI high, tumor mutation burden 41 CTs 07/06/2024-enhancing lesser curvature wall thickening/mass; mesenteric  root fat stranding and multiple retroperitoneal/periaortic and celiac lymph nodes; new right upper lobe upper lobe pulmonary nodule; multiple prominent mediastinal lymph nodes 07/29/2024 PET scan-hypermetabolic gastric mass.  No evidence of metastatic disease. 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  Trial of prednisone  initiated.  Arthralgias resolved.  Prednisone  tapered to 10 mg daily beginning 12/21/2024. Cycle 4 nivolumab  12/27/2024    Iron  deficiency anemia  Oral iron  initiated Hemoglobin improved 08/17/2024 CAD/CHF Diabetes Hypertension CKD Obstructive sleep apnea History of pulmonary embolus August 2022-previously on apixaban .  Apixaban  stopped due to bleeding. Ascending aortic aneurysm Admission 07/14/2024 with pneumonia and respiratory failure Arthralgias-likely secondary to toxicity from immunotherapy 11/29/2024 prednisone  40 mg daily 12/07/2024 prednisone  30 mg daily 12/21/2024 prednisone  10 mg daily    Disposition: Bradley Alvarez appears well.  He is in clinical remission from gastric cancer.  Immunotherapy was placed on hold 11/29/2024 due to arthralgias limiting his range of motion.  The arthralgias have resolved with prednisone .  The prednisone  has been tapered to 10 mg daily.  He will continue prednisone  at the current dose.  The plan is to resume immunotherapy with single agent nivolumab  today.  He will call for recurrent rash or  arthralgias.  Bradley Alvarez will return for an office visit and nivolumab  in 2 weeks.  Arley Hof, MD  12/27/2024   11:44 AM   "

## 2024-12-27 NOTE — Patient Instructions (Signed)
 [facility] - A DEPT OF [facility]  Discharge Instructions: Thank you for choosing [facility] to provide your oncology and hematology care.   If you have a lab appointment with the Cancer Center, please go directly to the Cancer Center and check in at the registration area.   Wear comfortable clothing and clothing appropriate for easy access to any Portacath or PICC line.   We strive to give you quality time with your provider. You may need to reschedule your appointment if you arrive late (15 or more minutes).  Arriving late affects you and other patients whose appointments are after yours.  Also, if you miss three or more appointments without notifying the office, you may be dismissed from the clinic at the provider's discretion.      For prescription refill requests, have your pharmacy contact our office and allow 72 hours for refills to be completed.    Today you received the following chemotherapy and/or immunotherapy agents: nivolumab .     To help prevent nausea and vomiting after your treatment, we encourage you to take your nausea medication as directed.  BELOW ARE SYMPTOMS THAT SHOULD BE REPORTED IMMEDIATELY: *FEVER GREATER THAN 100.4 F (38 C) OR HIGHER *CHILLS OR SWEATING *NAUSEA AND VOMITING THAT IS NOT CONTROLLED WITH YOUR NAUSEA MEDICATION *UNUSUAL SHORTNESS OF BREATH *UNUSUAL BRUISING OR BLEEDING *URINARY PROBLEMS (pain or burning when urinating, or frequent urination) *BOWEL PROBLEMS (unusual diarrhea, constipation, pain near the anus) TENDERNESS IN MOUTH AND THROAT WITH OR WITHOUT PRESENCE OF ULCERS (sore throat, sores in mouth, or a toothache) UNUSUAL RASH, SWELLING OR PAIN  UNUSUAL VAGINAL DISCHARGE OR ITCHING   Items with * indicate a potential emergency and should be followed up as soon as possible or go to the Emergency Department if any problems should occur.  Please show the CHEMOTHERAPY ALERT CARD or  IMMUNOTHERAPY ALERT CARD at check-in to the Emergency Department and triage nurse.  Should you have questions after your visit or need to cancel or reschedule your appointment, please contact [facility] - A DEPT OF [facility]  Dept: [phone]  and follow the prompts.  Office hours are 8:00 a.m. to 4:30 p.m. Monday - Friday. Please note that voicemails left after 4:00 p.m. may not be returned until the following business day.  We are closed weekends and major holidays. You have access to a nurse at all times for urgent questions. Please call the main number to the clinic Dept: [phone] and follow the prompts.   For any non-urgent questions, you may also contact your provider using MyChart. We now offer e-Visits for anyone 26 and older to request care online for non-urgent symptoms. For details visit [email or web address].   Also download the MyChart app! Go to the app store, search MyChart, open the app, select [place], and log in with your MyChart username and password.

## 2024-12-28 LAB — T4: T4, Total: 7.3 ug/dL (ref 4.5–12.0)

## 2024-12-30 ENCOUNTER — Other Ambulatory Visit (HOSPITAL_BASED_OUTPATIENT_CLINIC_OR_DEPARTMENT_OTHER): Payer: Self-pay

## 2024-12-30 ENCOUNTER — Other Ambulatory Visit: Payer: Self-pay

## 2024-12-30 MED ORDER — ACCU-CHEK AVIVA PLUS W/DEVICE KIT
PACK | 0 refills | Status: AC
  Filled 2024-12-30: qty 1, 1d supply, fill #0

## 2024-12-30 MED ORDER — ACCU-CHEK GUIDE TEST VI STRP
ORAL_STRIP | 3 refills | Status: AC
  Filled 2024-12-30: qty 200, 90d supply, fill #0

## 2024-12-30 MED ORDER — ACCU-CHEK SOFTCLIX LANCETS MISC
3 refills | Status: AC
  Filled 2024-12-30: qty 200, 90d supply, fill #0

## 2025-01-02 ENCOUNTER — Other Ambulatory Visit (HOSPITAL_BASED_OUTPATIENT_CLINIC_OR_DEPARTMENT_OTHER): Payer: Self-pay

## 2025-01-03 ENCOUNTER — Other Ambulatory Visit (HOSPITAL_BASED_OUTPATIENT_CLINIC_OR_DEPARTMENT_OTHER): Payer: Self-pay

## 2025-01-10 ENCOUNTER — Inpatient Hospital Stay: Admitting: Oncology

## 2025-01-10 ENCOUNTER — Inpatient Hospital Stay

## 2025-01-24 ENCOUNTER — Inpatient Hospital Stay: Admitting: Nurse Practitioner

## 2025-01-24 ENCOUNTER — Inpatient Hospital Stay

## 2025-02-27 ENCOUNTER — Ambulatory Visit: Admitting: Podiatry

## 2025-05-18 ENCOUNTER — Ambulatory Visit: Admitting: Pulmonary Disease
